# Patient Record
Sex: Female | Born: 1950 | Race: Black or African American | Hispanic: No | Marital: Married | Smoking: Never smoker
Health system: Southern US, Community
[De-identification: ages and names within clinical notes are randomized; demographics above are authoritative.]

## PROBLEM LIST (undated history)

## (undated) DIAGNOSIS — I499 Cardiac arrhythmia, unspecified: Secondary | ICD-10-CM

## (undated) DIAGNOSIS — I10 Essential (primary) hypertension: Secondary | ICD-10-CM

## (undated) DIAGNOSIS — J189 Pneumonia, unspecified organism: Secondary | ICD-10-CM

## (undated) HISTORY — PX: NO PAST SURGERIES: SHX2092

## (undated) SURGERY — Surgical Case
Anesthesia: *Unknown

---

## 2017-07-12 ENCOUNTER — Emergency Department (HOSPITAL_COMMUNITY)
Admission: EM | Admit: 2017-07-12 | Discharge: 2017-07-12 | Disposition: A | Discharge status: Home / Self Care | Payer: Self-pay | Attending: Emergency Medicine | Admitting: Emergency Medicine

## 2017-07-12 ENCOUNTER — Encounter (HOSPITAL_COMMUNITY): Payer: Self-pay | Admitting: Emergency Medicine

## 2017-07-12 DIAGNOSIS — I10 Essential (primary) hypertension: Secondary | ICD-10-CM | POA: Insufficient documentation

## 2017-07-12 DIAGNOSIS — Y999 Unspecified external cause status: Secondary | ICD-10-CM | POA: Insufficient documentation

## 2017-07-12 DIAGNOSIS — S81802A Unspecified open wound, left lower leg, initial encounter: Secondary | ICD-10-CM | POA: Insufficient documentation

## 2017-07-12 DIAGNOSIS — Y929 Unspecified place or not applicable: Secondary | ICD-10-CM | POA: Insufficient documentation

## 2017-07-12 DIAGNOSIS — M7989 Other specified soft tissue disorders: Secondary | ICD-10-CM

## 2017-07-12 DIAGNOSIS — X58XXXA Exposure to other specified factors, initial encounter: Secondary | ICD-10-CM | POA: Insufficient documentation

## 2017-07-12 DIAGNOSIS — Z79899 Other long term (current) drug therapy: Secondary | ICD-10-CM | POA: Insufficient documentation

## 2017-07-12 DIAGNOSIS — R2243 Localized swelling, mass and lump, lower limb, bilateral: Secondary | ICD-10-CM | POA: Insufficient documentation

## 2017-07-12 DIAGNOSIS — Y939 Activity, unspecified: Secondary | ICD-10-CM | POA: Insufficient documentation

## 2017-07-12 HISTORY — DX: Essential (primary) hypertension: I10

## 2017-07-12 LAB — BASIC METABOLIC PANEL
Anion gap: 14 (ref 5–15)
BUN: 8 mg/dL (ref 6–20)
CHLORIDE: 106 mmol/L (ref 101–111)
CO2: 24 mmol/L (ref 22–32)
CREATININE: 0.95 mg/dL (ref 0.44–1.00)
Calcium: 9.2 mg/dL (ref 8.9–10.3)
GFR calc Af Amer: >60 mL/min (ref >60)
GFR calc non Af Amer: >60 mL/min (ref >60)
Glucose, Bld: 67 mg/dL (ref 65–99)
Potassium: 4.1 mmol/L (ref 3.5–5.1)
SODIUM: 144 mmol/L (ref 135–145)

## 2017-07-12 LAB — TROPONIN I

## 2017-07-12 MED ORDER — DOXYCYCLINE HYCLATE 100 MG PO CAPS: 100.0000 mg | ORAL_CAPSULE | Freq: Two times a day (BID) | ORAL | 0 refills | Status: DC

## 2017-07-12 MED ORDER — HYDROCHLOROTHIAZIDE 25 MG PO TABS: 25.0000 mg | ORAL_TABLET | Freq: Every day | ORAL | 3 refills | Status: DC

## 2017-07-12 NOTE — ED Notes (Addendum)
This EMT approached by pt. Pt does not speak english. Technical sales engineerLingala translator  Services by phone. Pt inquired about wait time. Pt also inquired about transportation back home. Pt provided following information about contact with possible transportation post dicharge:  Rodman CompMarietta Gouglas, RN [employer] [phone]   This EMT to follow up with contact provided and consult with case manager.

## 2017-07-12 NOTE — ED Notes (Signed)
RN asked for phlebotomist to draw blood. Multiple attempts made by other staff with no success.

## 2017-07-12 NOTE — ED Provider Notes (Signed)
[facility] EMERGENCY DEPARTMENT Provider Note   CSN: [id number] Arrival date & time: 07/12/17  1219     History   Chief Complaint Chief Complaint  Patient presents with  . Knee Pain    HPI [name] [name] is a 67 y.o. female.  The patient was interviewed through a fluent translator, by phone, and her native language Ling[place], from the [place].  HPI   The patient is here for evaluation of high blood pressure and knee pain.  She also complains of a wound to her right ankle region, present for 2 months.  The knee pain has been present for several weeks.  She arrived in the [place], from the [place], about 2 weeks ago.  She is currently taking enalapril, daily, for hypertension.  She was seen earlier today by a congregational nurse, found to have high blood pressure and sent here for evaluation.  Patient denies chest pain, shortness of breath, headache, dizziness or weakness.  There are no other no modifying factors.  Past Medical History:  Diagnosis Date  . Hypertension     There are no active problems to display for this patient.   History reviewed. No pertinent surgical history.   OB History   None      Home Medications    Prior to Admission medications   Medication Sig Start Date End Date Taking? Authorizing Provider  Calcium Carbonate (CALCIUM 600 PO) Take 1,200 mg by mouth 2 (two) times daily.    Yes [provider]  UNKNOWN TO PATIENT Unnamed OTC for "muscle, knee, and leg pain": Take 2 tablets by mouth in the morning and 2 tablets at night   Yes [provider]  UNKNOWN TO PATIENT Unnamed ACE inhibitor- Take 10 mg by mouth once a day for blood pressure/ends in "-pril"   Yes [provider]  doxycycline (VIBRAMYCIN) 100 MG capsule Take 1 capsule (100 mg total) by mouth 2 (two) times daily. 07/12/17   Mancel Bale, MD  hydrochlorothiazide (HYDRODIURIL) 25 MG tablet Take 1 tablet (25 mg total) by mouth daily. 07/12/17    Mancel Bale, MD    Family History No family history on file.  Social History Social History   Tobacco Use  . Smoking status: Not on file  Substance Use Topics  . Alcohol use: Not on file  . Drug use: Not on file     Allergies   Patient has no known allergies.   Review of Systems Review of Systems  All other systems reviewed and are negative.    Physical Exam Updated Vital Signs BP (!) 190/90   Pulse (!) 59   Temp 98.4 F (36.9 C) (Oral)   Resp 12   SpO2 98%   Physical Exam  Constitutional: She is oriented to person, place, and time. She appears well-developed and well-nourished.  HENT:  Head: Normocephalic and atraumatic.  Right Ear: External ear normal.  Left Ear: External ear normal.  Eyes: Pupils are equal, round, and reactive to light. Conjunctivae and EOM are normal.  Neck: Normal range of motion and phonation normal. Neck supple.  Cardiovascular: Normal rate.  Pulmonary/Chest: Effort normal. She exhibits no bony tenderness.  Musculoskeletal: Normal range of motion. She exhibits edema (Bilateral legs, ankles to feet, right greater than left.).  Right anterior lower leg wound just above the ankle.  Wound is open and has a small amount of granulation tissue and drainage, from a wound approximately 1.5 cm in diameter.  There is mild associated  fluctuance and edema associated with this wound.  There is normal range of motion of both knees ankles and feet.  She appears to have normal perfusion to the toes of both feet.  Neurological: She is alert and oriented to person, place, and time. No cranial nerve deficit or sensory deficit. She exhibits normal muscle tone. Coordination normal.  Skin: Skin is warm, dry and intact.  Psychiatric: She has a normal mood and affect. Her behavior is normal. Judgment and thought content normal.  Nursing note and vitals reviewed.    ED Treatments / Results  Labs (all labs ordered are listed, but only abnormal results are  displayed) Labs Reviewed  BASIC METABOLIC PANEL  TROPONIN I  CBC WITH DIFFERENTIAL/PLATELET  SEDIMENTATION RATE    EKG EKG Interpretation  Date/Time:  Wednesday July 12 2017 18:05:27 EDT Ventricular Rate:  62 PR Interval:    QRS Duration: 104 QT Interval:  435 QTC Calculation: 442 R Axis:   57 Text Interpretation:  Sinus rhythm Multiform ventricular premature complexes Anteroseptal infarct, old Borderline repolarization abnormality Borderline ST elevation, lateral leads No old tracing to compare Confirmed by Mancel BaleWentz, Laiylah Roettger [phone]([id number]) on 07/12/2017 6:54:24 PM   Radiology No results found.  Procedures Procedures (including critical care time)  Medications Ordered in ED Medications - No data to display   Initial Impression / Assessment and Plan / ED Course  I have reviewed the triage vital signs and the nursing notes.  Pertinent labs & imaging results that were available during my care of the patient were reviewed by me and considered in my medical decision making (see chart for details).  Clinical Course as of Jul 12 2221  Wed Jul 12, 2017  2157 Normal  Basic metabolic panel [EW]  2215 Normal  Troponin I [EW]    Clinical Course User Index [EW] Mancel BaleWentz, Tenelle Andreason, MD     Patient Vitals for the past 24 hrs:  BP Temp Temp src Pulse Resp SpO2  07/12/17 2130 (!) 190/90 - - (!) 59 12 98 %  07/12/17 1817 (!) 224/105 - - 61 15 99 %  07/12/17 1658 (!) 232/119 - - 68 18 100 %  07/12/17 1437 (!) 192/91 98.4 F (36.9 C) Oral (!) 59 16 100 %  07/12/17 1221 (!) 200/103 98.9 F (37.2 C) Oral 66 18 100 %    10:21 PM Reevaluation with update and discussion. After initial assessment and treatment, an updated evaluation reveals the patient is comfortable and eating supper.  She has no further complaints.  Findings discussed with the patient through the translator and all questions were answered. Mancel BaleElliott Montina Dorrance   Medical Decision Making: Lower leg swelling with high blood pressure.   Swelling right leg greater than left, likely associated with the wound in her ankle.  Doubt DVT, cellulitis, osteomyelitis or systemic infection.  CRITICAL CARE-no Performed by: Mancel BaleElliott Prima Rayner   Nursing Notes Reviewed/ Care Coordinated Applicable Imaging Reviewed Interpretation of Laboratory Data incorporated into ED treatment  The patient appears reasonably screened and/or stabilized for discharge and I doubt any other medical condition or other [facility] requiring further screening, evaluation, or treatment in the ED at this time prior to discharge.  Plan: Home Medications-continue current blood pressure medications; Home Treatments-Daily cleansing to ankle wound and warm compress to help wound heal.; return here if the recommended treatment, does not improve the symptoms; Recommended follow up-PCP follow-up as soon as possible for further care and treatment.   Final Clinical Impressions(s) / ED Diagnoses   Final diagnoses:  Hypertension, unspecified type  Leg swelling  Wound of left lower extremity, initial encounter    ED Discharge Orders        Ordered    hydrochlorothiazide (HYDRODIURIL) 25 MG tablet  Daily     07/12/17 2217    doxycycline (VIBRAMYCIN) 100 MG capsule  2 times daily     07/12/17 2217       Mancel Bale, MD 07/12/17 2223

## 2017-07-12 NOTE — ED Notes (Signed)
RN attempted to draw blood from IV, unsuccessful

## 2017-07-12 NOTE — ED Triage Notes (Addendum)
Patient complains of right knee pain, arrives with [facility] called out by congressional nurse who did not speak Lingala. EMS states they were called out for chest pain by congressional nurse, when EMS arrived translator had also just arrived, patient denied chest pain but was complaining about her knees. On arrival to ED with Domingo MadeiraLingala translator, patient states her knees have hurt for approximatley twenty years. Patient has history of hypertension, states she has taken her blood pressure medicine today.  BP 195/109 HR 67 RR 18 95% SpO2 on room air

## 2017-07-12 NOTE — ED Notes (Signed)
Pt called out wondering what was taking so long. RN explained using Lingala interpreter, we need her blood work

## 2017-07-12 NOTE — ED Notes (Signed)
Contact numbers provided did not yield an answer. Followed up with case management.

## 2017-07-12 NOTE — Discharge Instructions (Addendum)
Your blood pressure, and swelling in your legs, should improve with the prescribed medication, hydrochlorothiazide.  We are prescribing an antibiotic to treat the wound of your right ankle.  Clean the wound on your right ankle with soap and water every day, and apply a warm compress to it to help the wound heal.  It is important to follow-up with a primary care doctor for a checkup in 1 or 2 weeks.  Use the resource guide attached, to help you find a doctor.

## 2017-07-12 NOTE — Congregational Nurse Program (Signed)
Congregational Nurse Program Note  Date of Encounter: 07/12/2017  Past Medical History: Past Medical History:  Diagnosis Date  . Hypertension     Encounter Details: CNP Questionnaire - 07/12/17 1045      Questionnaire   Patient Status  Immigrant    Race  African    Location Patient Served At  [facility]    Insurance  Not Applicable    Uninsured  Uninsured (NEW 1x/quarter)    Food  No food insecurities    Housing/Utilities  Yes, have permanent housing    Transportation  Yes, need transportation assistance;Provided transportation assistance (bus pass, taxi voucher, etc.)    Interpersonal Safety  Yes, feel physically and emotionally safe where you currently live    Medication  Yes, have medication insecurities    Medical Provider  No    Referrals  Emergency Department    ED Visit Averted  Not Applicable    Life-Saving Intervention Made  Not Applicable

## 2017-07-12 NOTE — Congregational Nurse Program (Signed)
Congregational Nurse Program Note  Date of Encounter: 07/12/2017  Past Medical History: Past Medical History:  Diagnosis Date  . Hypertension     Encounter Details: CNP Questionnaire - 07/12/17 1045      Questionnaire   Patient Status  Immigrant    Race  African    Location Patient Served At  [facility]    Insurance  Not Applicable    Uninsured  Uninsured (NEW 1x/quarter)    Food  No food insecurities    Housing/Utilities  Yes, have permanent housing    Transportation  Yes, need transportation assistance;Provided transportation assistance (bus pass, taxi voucher, etc.)    Interpersonal Safety  Yes, feel physically and emotionally safe where you currently live    Medication  Yes, have medication insecurities    Medical Provider  No    Referrals  Emergency Department    ED Visit Averted  Not Applicable    Life-Saving Intervention Made  Not Applicable      Referral to nurse office at [facility] for complaint of "pain in eyes, right leg and blood pressure". No medications taken today or recently.  Non-English speaking lady arriving from [place] through [employer] since two weeks and attending English classes at [facility]. Originally indicated some Swahili or [place], but interpreter had difficulty understanding patient. Communicated effectively using Lingala dialect via [employer] and student interpreter. Appeared alert during interview, except seemed uncomfortable in general. Blood pressure fluctuated between 187/97-200/108. Afebrile. Observed difficult mobility using cane with arm brace.  Right leg lower extremity tissue discoloration, 2-3 plus edema into ankle area and pitting. Approximately 2 cm lesion with scab on right lower leg. Unaccompanied by family. Plans to contact friends or family member at address given. Arrangement for transportation after discharge needed. [facility]. Follow-up with nurse office at [facility] prn. Ferol LuzMarietta Akia Montalban, RN/CN. [phone]

## 2017-07-18 NOTE — Congregational Nurse Program (Signed)
Congregational Nurse Program Note  Date of Encounter: 07/18/2017  Past Medical History: Past Medical History:  Diagnosis Date  . Hypertension     Encounter Details: CNP Questionnaire - 07/18/17 1454      Questionnaire   Patient Status  Immigrant    Race  African    Location Patient Served At  [facility]    Insurance  Not Applicable    Uninsured  Uninsured (Subsequent visits/quarter)    Food  No food insecurities    Housing/Utilities  Yes, have permanent housing    Transportation  Yes, need transportation assistance    Interpersonal Safety  Yes, feel physically and emotionally safe where you currently live    Medication  Yes, have medication insecurities    Medical Provider  No    ED Visit Averted  Yes    Life-Saving Intervention Made  Not Applicable       This is a follow-up visit after [facility]. Continues to experience pain in right lower leg and ankle during mobility today. Using cane with arm support to walk. Requesting a referral for evaluation of leg, but status as "Immigrant" prohibits Medicaid eligibility. Previously spoke to agency SW and information confirmed. No private insurance coverage. Blood pressure improved but continues elevated with pain apparently. Condition seems stressful. Edema of right leg decreased and less redness, but dark reddish discoloration continues above ankle and lesion about  the same. Recommended follow-up with [facility] office during attendance  in 1 week. Stressed need to continue medication as directed. Counseled regarding low salt diet and increasing fresh vegetables and fruits. Ferol LuzMarietta Cashius Grandstaff, RN/CN  Condition seems stress

## 2017-07-25 NOTE — Congregational Nurse Program (Signed)
Congregational Nurse Program Note  Date of Encounter: 07/25/2017  Past Medical History: Past Medical History:  Diagnosis Date  . Hypertension     Encounter Details: CNP Questionnaire - 07/25/17 1115      Questionnaire   Patient Status  Immigrant    Race  African    Location Patient Served At  [facility]    Insurance  Not Applicable    Uninsured  Uninsured (Subsequent visits/quarter)    Food  No food insecurities    Housing/Utilities  Yes, have permanent housing    Transportation  Yes, need transportation assistance    Interpersonal Safety  Yes, feel physically and emotionally safe where you currently live    Medication  Yes, have medication insecurities    Medical Provider  No    Referrals  Emergency Department    ED Visit Averted  Yes    Life-Saving Intervention Made  Not Applicable      Office visit at [facility] to follow-up elevated blood pressure and status of right lower leg ulcer. This Lingala speaking lady is of "Immigration status" and inelIgible for Medicaid or other affordable health care coverage during limited visit to [place]  Blood pressure improved, but continues abnormally high. Right lower outer leg ulcer above right ankle continues to drain small amount of bloody discharge from a 2 cm lesion. Affected area extends approximately 2 inches with discoloration and flaking of skin.  Continues to complain of severe right knee pain when walking with crutch. Requested information on cost of private health insurance premium. Recommended continuous cleansing and soaking of right foot in warm. Emphasized need to take medication for B/P as instructed. Return on 07/26/17 for observation of lesion and blood pressure check. Ferol LuzMarietta Siddarth Hsiung, RN/CHN.

## 2017-07-26 NOTE — Congregational Nurse Program (Signed)
Congregational Nurse Program Note  Date of Encounter: 07/26/2017  Past Medical History: Past Medical History:  Diagnosis Date  . Hypertension     Encounter Details: CNP Questionnaire - 07/26/17 1100      Questionnaire   Patient Status  Immigrant    Race  African    Location Patient Served At  [facility]    Insurance  Not Applicable    Uninsured  Uninsured (Subsequent visits/quarter)    Food  No food insecurities    Housing/Utilities  Yes, have permanent housing    Transportation  Yes, need transportation assistance    Interpersonal Safety  Yes, feel physically and emotionally safe where you currently live    Medication  Yes, have medication insecurities    Medical Provider  No    Referrals  Emergency Department    ED Visit Averted  Yes    Life-Saving Intervention Made  Not Applicable       Follow-up office visit for blood pressure recheck and seeking assistance with securing health insurance coverage. Blood pressure range continues elevated. Brought medication container for hydrochlorothiazide 25mg  confirmed doxycycline finished. Medication instructions provided. Continues to complain of severe right knee and leg pain. Right knee swollen. Now taking Aleve for pain. Feeling "some" relief from pain in knee. Lesion of right ankle approximately 2x1.5  cm deep red and no drainage or pus noted. No history of diabetes. Less lower leg swelling than initially. Walking in open flat toe sandals. Recommended different type shoe for stability and safety of feet. Information provided on a few sites for medical insurance coverage. Return 08/01/17 for observation of open lesion on right ankle. Ferol LuzMarietta Rashawd Laskaris, RN/CN.

## 2017-08-30 NOTE — Congregational Nurse Program (Signed)
Congregational Nurse Program Note  Date of Encounter: 08/22/2017  Past Medical History: Past Medical History:  Diagnosis Date  . Hypertension     Encounter Details: CNP Questionnaire - 08/22/17 1015      Questionnaire   Patient Status  Immigrant    Race  African    Location Patient Served At  [facility]    Insurance  Not Applicable    Uninsured  Uninsured (Subsequent visits/quarter)    Food  No food insecurities    Housing/Utilities  Yes, have permanent housing    Transportation  Yes, need transportation assistance    Interpersonal Safety  Yes, feel physically and emotionally safe where you currently live    Medication  Yes, have medication insecurities    Medical Provider  No    Referrals  Emergency Department    ED Visit Averted  Yes    Life-Saving Intervention Made  Not Applicable      Follow-up office visit at [facility] nurse office due to chronic hypertension with recent treatment after arrival in [place] from [place]. Interview with [employer] speaking Swahil. No evidence of family present. B/P continues to fluctuate. Taking medication as prescribed since last visit to Urgent Care.  Observed stressed during most encounters due to concern about health and questionable ability to understand health system. As previously discussed, status as Immigrant denies her access to Medicaid or Orange Card at this time. Right ankle lesion noted approximately 1.5 cm circle x .5 cm deep with pus and bloody drainage. Original scab fell off.  No odor. Surrounding tissue discolored and scaly. Experiencing pain at site during examination and knee pain walking. Again discussed need to seek medical coverage and attention for problem. Denies financial support or family. Referred to [facility] second time for legal advice and [facility] assistance. Advised to return to Urgent Care for re-evaluation of right leg. Refused due to finances.  Investigate other resources for medical evaluation. Continue to evaluate blood  pressure at this site. Ferol LuzMarietta Loralyn [name], RN/CN [address] [facility] Recent Vital Signs   BP (!) 158/87 (BP Location: Left Arm, Patient Position: Sitting, Cuff Size: Normal)   Pulse 65   Temp 98.4 F (36.9 C) (Temporal)   Resp 14    Past Medical History:  Diagnosis Date  . Hypertension      Expected Discharge Date     Diet Order    None       VTE Documentation      Work Intensity Score/Level of Care     @LEVELOFCARE @   Mobility        Significant Events    DC Barriers   Abnormal Labs:  Ferol LuzMarietta Zarianna Dicarlo 08/30/2017, 2:52 PM

## 2017-10-03 ENCOUNTER — Other Ambulatory Visit: Payer: Self-pay

## 2017-10-03 ENCOUNTER — Emergency Department (HOSPITAL_COMMUNITY): Payer: Self-pay

## 2017-10-03 ENCOUNTER — Emergency Department (HOSPITAL_COMMUNITY)
Admission: EM | Admit: 2017-10-03 | Discharge: 2017-10-04 | Disposition: A | Discharge status: Home / Self Care | Payer: Self-pay | Attending: Emergency Medicine | Admitting: Emergency Medicine

## 2017-10-03 ENCOUNTER — Encounter: Payer: Self-pay | Admitting: Pediatric Intensive Care

## 2017-10-03 ENCOUNTER — Encounter (HOSPITAL_COMMUNITY): Payer: Self-pay

## 2017-10-03 DIAGNOSIS — I10 Essential (primary) hypertension: Secondary | ICD-10-CM | POA: Insufficient documentation

## 2017-10-03 DIAGNOSIS — Y939 Activity, unspecified: Secondary | ICD-10-CM | POA: Insufficient documentation

## 2017-10-03 DIAGNOSIS — M25561 Pain in right knee: Secondary | ICD-10-CM | POA: Insufficient documentation

## 2017-10-03 DIAGNOSIS — Z5189 Encounter for other specified aftercare: Secondary | ICD-10-CM

## 2017-10-03 DIAGNOSIS — Y999 Unspecified external cause status: Secondary | ICD-10-CM | POA: Insufficient documentation

## 2017-10-03 DIAGNOSIS — Y92009 Unspecified place in unspecified non-institutional (private) residence as the place of occurrence of the external cause: Secondary | ICD-10-CM | POA: Insufficient documentation

## 2017-10-03 DIAGNOSIS — Z79899 Other long term (current) drug therapy: Secondary | ICD-10-CM | POA: Insufficient documentation

## 2017-10-03 DIAGNOSIS — W1842XA Slipping, tripping and stumbling without falling due to stepping into hole or opening, initial encounter: Secondary | ICD-10-CM | POA: Insufficient documentation

## 2017-10-03 DIAGNOSIS — S91001A Unspecified open wound, right ankle, initial encounter: Secondary | ICD-10-CM | POA: Insufficient documentation

## 2017-10-03 DIAGNOSIS — M25562 Pain in left knee: Secondary | ICD-10-CM | POA: Insufficient documentation

## 2017-10-03 LAB — CBC WITH DIFFERENTIAL/PLATELET
Abs Immature Granulocytes: 0 10*3/uL (ref 0.0–0.1)
BASOS ABS: 0 10*3/uL (ref 0.0–0.1)
BASOS PCT: 1 %
EOS PCT: 3 %
Eosinophils Absolute: 0.2 10*3/uL (ref 0.0–0.7)
HCT: 39.6 % (ref 36.0–46.0)
HEMOGLOBIN: 12.6 g/dL (ref 12.0–15.0)
Immature Granulocytes: 0 %
LYMPHS PCT: 42 %
Lymphs Abs: 2.5 10*3/uL (ref 0.7–4.0)
MCH: 26.8 pg (ref 26.0–34.0)
MCHC: 31.8 g/dL (ref 30.0–36.0)
MCV: 84.1 fL (ref 78.0–100.0)
Monocytes Absolute: 0.6 10*3/uL (ref 0.1–1.0)
Monocytes Relative: 10 %
NEUTROS ABS: 2.7 10*3/uL (ref 1.7–7.7)
Neutrophils Relative %: 44 %
PLATELETS: 113 10*3/uL — AB (ref 150–400)
RBC: 4.71 MIL/uL (ref 3.87–5.11)
RDW: 14.8 % (ref 11.5–15.5)
WBC: 6 10*3/uL (ref 4.0–10.5)

## 2017-10-03 LAB — BASIC METABOLIC PANEL
Anion gap: 8 (ref 5–15)
BUN: 15 mg/dL (ref 8–23)
CALCIUM: 9.2 mg/dL (ref 8.9–10.3)
CO2: 27 mmol/L (ref 22–32)
CREATININE: 0.99 mg/dL (ref 0.44–1.00)
Chloride: 109 mmol/L (ref 98–111)
GFR, EST NON AFRICAN AMERICAN: 57 mL/min — AB (ref >60)
Glucose, Bld: 100 mg/dL — ABNORMAL HIGH (ref 70–99)
Potassium: 4.3 mmol/L (ref 3.5–5.1)
SODIUM: 144 mmol/L (ref 135–145)

## 2017-10-03 LAB — SEDIMENTATION RATE: SED RATE: 24 mm/h — AB (ref 0–22)

## 2017-10-03 MED ORDER — ACETAMINOPHEN 325 MG PO TABS
650.0000 mg | ORAL_TABLET | Freq: Once | ORAL | Status: AC
  Administered 2017-10-04: 650 mg via ORAL
  Filled 2017-10-03: qty 2

## 2017-10-03 MED ORDER — DOXYCYCLINE HYCLATE 100 MG PO CAPS: 100.0000 mg | ORAL_CAPSULE | Freq: Two times a day (BID) | ORAL | 0 refills | Status: DC

## 2017-10-03 MED ORDER — DOXYCYCLINE HYCLATE 100 MG PO TABS
100.0000 mg | ORAL_TABLET | Freq: Once | ORAL | Status: AC
  Administered 2017-10-04: 100 mg via ORAL
  Filled 2017-10-03: qty 1

## 2017-10-03 NOTE — ED Notes (Signed)
ED provider at bedside.

## 2017-10-03 NOTE — Care Management (Signed)
ED CM and CSW met with patient and Congregational Nurse Emerson Monte in triage. Patient does not speak [place] speaks [place] and prefers her dialect of [place].  Patient presents to [facility] ED with c/o of wound check. Patient is being followed by the [place], patient currently does not have a PCP, ED CM will reach out to [facility] CM to arrange follow up appointment. CM and CSW will continue to follow patient for discharge needs.

## 2017-10-03 NOTE — ED Provider Notes (Signed)
[facility] EMERGENCY DEPARTMENT Provider Note   CSN: [id number] Arrival date & time: 10/03/17  1510     History   Chief Complaint Chief Complaint  Patient presents with  . Wound Check    HPI [name] [name] is a 67 y.o. female with a past medical history of hypertension who presents today for evaluation of a wound check.  History is obtained from patient using interpreter, and chart review.  Patient has reportedly a chronic nonhealing wound to her right lateral ankle.  Chart review shows that this developed sometime around April.  She says that she was in the [place] when she rolled her ankle in a divot in the dark floor of her home and since then has had this wound.  She denies any fevers or chills.  She has been putting alcohol on the wound without significant relief.  She has been using a walking stick to help with the pain from this wound.  She denies any past medical history of diabetes.  She also reports chronic, bilateral worsening knee pain.  She says that this is been going on for multiple months.  She says that her family member in [place] went to the hospital and had operations to fix her with her knee pain and she wants this done for her also.    When asked about the black lines on her legs over her knees patient states that a few years ago in [place] she saw a healer who caught her knees and put African medicine in saying that they would help  HPI  Past Medical History:  Diagnosis Date  . Hypertension     There are no active problems to display for this patient.   History reviewed. No pertinent surgical history.   OB History   None      Home Medications    Prior to Admission medications   Medication Sig Start Date End Date Taking? Authorizing Provider  Calcium Carbonate (CALCIUM 600 PO) Take 1,200 mg by mouth 2 (two) times daily.     [provider]  doxycycline (VIBRAMYCIN) 100 MG capsule Take 1 capsule (100 mg total) by mouth 2 (two)  times daily. 07/12/17   Mancel Bale, MD  hydrochlorothiazide (HYDRODIURIL) 25 MG tablet Take 1 tablet (25 mg total) by mouth daily. 07/12/17   Mancel Bale, MD  UNKNOWN TO PATIENT Unnamed OTC for "muscle, knee, and leg pain": Take 2 tablets by mouth in the morning and 2 tablets at night    [provider]  UNKNOWN TO PATIENT Unnamed ACE inhibitor- Take 10 mg by mouth once a day for blood pressure/ends in "-pril"    [provider]    Family History History reviewed. No pertinent family history.  Social History Social History   Tobacco Use  . Smoking status: Never Smoker  . Smokeless tobacco: Never Used  Substance Use Topics  . Alcohol use: Not on file  . Drug use: Not on file     Allergies   Patient has no known allergies.   Review of Systems Review of Systems  Constitutional: Negative for chills and fever.  Musculoskeletal:       Pain in her bilateral knees.   Skin: Positive for wound.  All other systems reviewed and are negative.    Physical Exam Updated Vital Signs BP (!) 188/94 (BP Location: Right Arm)   Pulse 60   Temp 98.4 F (36.9 C) (Oral)   Resp 16   SpO2 100%  Physical Exam  Constitutional: She appears well-developed and well-nourished. No distress.  HENT:  Head: Normocephalic and atraumatic.  Eyes: Conjunctivae are normal. Right eye exhibits no discharge. Left eye exhibits no discharge. No scleral icterus.  Neck: Normal range of motion.  Cardiovascular: Normal rate, regular rhythm and intact distal pulses.  2+ DP/PT pulses bilaterally.  Pulmonary/Chest: Effort normal. No stridor. No respiratory distress.  Abdominal: She exhibits no distension.  Musculoskeletal: She exhibits no edema or deformity.  Bilateral knees have intact range of motion.  No obvious swelling to the knees.  There is no localized tenderness to palpation, rather bilateral diffuse tenderness to palpation.   Neurological: She is alert. She exhibits normal muscle  tone.  Skin: Skin is warm and dry. She is not diaphoretic.  There is a wound on the lateral aspect of the right ankle.  Wound debridement was performed.  No surrounding erythema, induration or fluctuance.    There are multiple subcentimeter black lines on patient's bilateral legs, these do not appear infected  Psychiatric: She has a normal mood and affect. Her behavior is normal.  Nursing note and vitals reviewed.        ED Treatments / Results  Labs (all labs ordered are listed, but only abnormal results are displayed) Labs Reviewed  CBC WITH DIFFERENTIAL/PLATELET - Abnormal; Notable for the following components:      Result Value   Platelets 113 (*)    All other components within normal limits  BASIC METABOLIC PANEL - Abnormal; Notable for the following components:   Glucose, Bld 100 (*)    GFR calc non Af Amer 57 (*)    All other components within normal limits  SEDIMENTATION RATE - Abnormal; Notable for the following components:   Sed Rate 24 (*)    All other components within normal limits    EKG None  Radiology No results found.  Procedures Debridement Performed by: Cristina Gong, PA-C Authorized by: Cristina Gong, PA-C  Consent: Verbal consent obtained. Local anesthesia used: no  Anesthesia: Local anesthesia used: no  Sedation: Patient sedated: no  Patient tolerance: Patient tolerated the procedure well with no immediate complications    (including critical care time)  Medications Ordered in ED Medications - No data to display   Initial Impression / Assessment and Plan / ED Course  I have reviewed the triage vital signs and the nursing notes.  Pertinent labs & imaging results that were available during my care of the patient were reviewed by me and considered in my medical decision making (see chart for details).    Patient presents today for evaluation of a wound on her right lateral ankle.  This wound is been present for multiple  months and is not healing.  Labs today do not show a significant leukocytosis, her glucose is not elevated.  X-rays of her bilateral knees and right ankle were obtained per her request.  Bilateral knees show arthritis, most likely causing her knee pain.  Her x-rays of her right ankle did not show any osseous involvement.  She is afebrile, not septic or tachycardic.  She was given a dose of doxycycline while in the emergency room along with prescription for at home doxycycline.  Ibuprofen and Tylenol as needed for pain.  Case management and social work was heavily involved in patient's care along with setting up appropriate outpatient follow-up, appreciate their involvement and assistance.  Ortho follow-up for her bilateral knee arthritis  The patient's wounds were dressed.  Return precautions  were discussed, and patient states her understanding.  Patient discharged home.  Final Clinical Impressions(s) / ED Diagnoses   Final diagnoses:  Visit for wound check    ED Discharge Orders    None       Cristina Gong, PA-C 10/04/17 0154    Rolan Bucco, MD 10/04/17 1547

## 2017-10-03 NOTE — Discharge Instructions (Signed)
Please take your antibiotics.    Ibuprofen 600mg  every 8 hours And Tylenol 650 mg every 8 hours

## 2017-10-03 NOTE — Congregational Nurse Program (Signed)
Via interpreter Sofie- client states that she has had wound above her right ankle since she came from the [place] at the end of May. She states that she has been reluctant to seek medical evaluation as people have told her that she will be charge for medica care and she has no money to pay. Client states that she did not come via a resettlement agency. Client states that she lives by herself. Client states that she needs to pay rent tomorrow and that she would rather go to the emergency room tomorrow. Client denies diabetes history. CN explained that she can be evaluated in the emergency department regardless of ability to pay. CN will meet client in emergency department and send client via taxi. Client has 2cm wide wound above right ankle. There is an area around wound with granulation. The wound has a small amount of serous drainage. There is erythema above the wound approximately half-way to knee. The clients lower leg is edematous in comparison to the left leg. Client agrees to be evaluated in the emergency department and will be transported via taxi. CN will meet client in ED.

## 2017-10-03 NOTE — ED Notes (Signed)
Pt speaks [name] only

## 2017-10-03 NOTE — ED Notes (Signed)
Patient transported to X-ray 

## 2017-10-03 NOTE — Progress Notes (Addendum)
Pt prefers to speak Lingala. Pt speaks [place] as well.   CSW met with pt with Congregational Nurse, [place]. [place] brought pt in from [place] [facility] for wound check. CSW used language line and spoke with pt in [place]. Pt can speak [place], would prefer to speak Lingala. Per Congregational Nurse, pt reports receiving the wound back in May before coming to the [place]. Since developing the wound, the wound has been having discharge. Pt hesitant to seek medical care. Pt has food insecurity, will provide resources and pt is being followed by [facility]. Pt has an apartment, lives alone. Pt is currently able to pay rent.   Social Work will continue to follow. CSW provided taxi voucher to RN for pt to return home pending disposition.   Wendelyn Breslow, [facility]  [phone]

## 2017-10-03 NOTE — ED Provider Notes (Signed)
Patient placed in Quick Look pathway, seen and evaluated   Interpretor used for evaluation. Pt speaks Lingala and [name] (Domingo Madeira is primary)  Chief Complaint: wound check  HPI:  Pt is a 67 y/o female with a h/o HTN who presents to the ED today for wound recheck. Spoke with victoria, RN from [employer] at bedside who gave history that this wound has been present at least since 07/2017. Pt presented to clinic today with continued complaints of the wound. She advised pt to come to ED.  Pt notes she has had drainage from the wound. No fevers/chills. Has tried putting alcohol on the wound with no relief.  ROS: wound to RLE (one)  Physical Exam:   Gen: No distress  Neuro: Awake and Alert  Skin: Warm    Focused Exam: RLE with 1+ pitting edema. 1.5 cm wound to RLE as pictured below.        Initiation of care has begun. The patient has been counseled on the process, plan, and necessity for staying for the completion/evaluation, and the remainder of the medical screening examination  Pt advised to inform nursing staff immediately if they experience any new or worsening of symptoms while waiting in the waiting room.    Karrie Meres, PA-C 10/03/17 1624    Gerhard Munch, MD 10/03/17 2355

## 2017-10-03 NOTE — ED Notes (Signed)
ED Provider at bedside. 

## 2017-10-03 NOTE — ED Triage Notes (Signed)
Pt here due to having wound to the right ankle.  Had some scant drainage a few days ago and went to pharmacy which perscribed alcohol to apply to the wound.  Pt is working on getting set up with wound ostomy care.  Pt is primarily a speaker of Lingala, Ipad interpreter used in care at triage.

## 2017-10-04 ENCOUNTER — Encounter: Payer: Self-pay | Admitting: Surgery

## 2017-10-04 ENCOUNTER — Telehealth: Payer: Self-pay

## 2017-10-04 NOTE — Telephone Encounter (Signed)
Call received from Shann Medal, [place] [facility] requesting an appointment for the patient at [facility]. An appointment was scheduled for tomorrow - 10/05/17 @ 1610 and this information was shared with [place].

## 2017-10-04 NOTE — Progress Notes (Unsigned)
ED CM contacted [facility] CM a follow up appointment has been scheduled for patient for tomorrow 9/5, [facility] Congregational  CM was  notified and arrangement will be made for patient's transport to  Appointment to establish care as per Clinic's CM.

## 2017-10-04 NOTE — ED Notes (Signed)
Patient verbalizes understanding of discharge instructions. Opportunity for questioning and answers were provided. Armband removed by staff, pt discharged from ED in wheelchair to cab, interpreter used.

## 2017-10-05 ENCOUNTER — Inpatient Hospital Stay: Payer: Self-pay | Admitting: Internal Medicine

## 2017-10-05 ENCOUNTER — Telehealth: Payer: Self-pay

## 2017-10-05 NOTE — Telephone Encounter (Signed)
Patients appointment has been re-scheduled to 10/24/17 @ 1030 due to scheduling needs.  Message sent to [facility], RN/ [facility] with the update.

## 2017-10-11 NOTE — Congregational Nurse Program (Signed)
Congregational Nurse Program Note  Date of Encounter: 10/11/2017  Past Medical History: Past Medical History:  Diagnosis Date  . Hypertension     Encounter Details: CNP Questionnaire - 10/11/17 1000      Questionnaire   Patient Status  Asylee    Race  African    Location Patient Served At  [employer]  Not Applicable    Uninsured  Uninsured (NEW 1x/quarter)    Food  Yes, have food insecurities;Within past 12 months, food ran out with no money to buy more    Housing/Utilities  Yes, have permanent housing    Transportation  Yes, need transportation assistance;Provided transportation assistance (bus pass, taxi voucher, etc.)    Interpersonal Safety  No, do not feel physically and emotionally safe where you currently live    Medication  Yes, have medication insecurities    Medical Provider  No    Referrals  Emergency Department    ED Visit Averted  Not Applicable    Life-Saving Intervention Made  Not Applicable      Office visit at [facility] office. Interview completed using staff interpreting in Swahili and [name] effectively. Lingala interpreter preferred. Seemed excited about recent medication for leg ulcer. Confirmed that all medications  Taken already as ordered. Will need a refill for Hydrodiuril 25 mg soon. Complained of constant right and left knee pain upon walking. Using knee support brace on right. Agreed to accept walker from agency to replace using crutch. Right lower leg ulcer 2x2 cm approximately and continues to ooze pale yellow pus upon pressure.  Expressed gratitude for assistance. Throughout encounter expressed frustration and stress about housing and financial state. No family or friends for support since arrival. Asylum status in limbo and attempting to finalize application. Currently awaiting assistance with medical coverage and scheduled for appointment at [facility], 10/24/17 at 10:30 am. POA: Meet with [facility] staff to pursue housing options; Complete  SCAT application for transport.; Provide walker to patient today; Return weekly for B/P evaluations at [facility]; Coordination of care between agencies explained and accepted; Appointment. with Chealy Sin 10/12/17 to review SCAT info and request food assistance if needed. Return on 10/17/17 for evaluation of leg ulcer and blood pressure. Coordination of care will be continued through the stated nursing staff and agencies. Ferol Luz, RN/CN

## 2017-10-24 ENCOUNTER — Inpatient Hospital Stay: Payer: Self-pay | Admitting: Family Medicine

## 2017-10-24 ENCOUNTER — Ambulatory Visit: Payer: Self-pay | Attending: Family Medicine | Admitting: Family Medicine

## 2017-10-24 ENCOUNTER — Encounter: Payer: Self-pay | Admitting: Family Medicine

## 2017-10-24 ENCOUNTER — Telehealth: Payer: Self-pay

## 2017-10-24 VITALS — BP 173/104 | HR 67 | Temp 97.9°F | Resp 16 | Wt 186.6 lb

## 2017-10-24 DIAGNOSIS — M170 Bilateral primary osteoarthritis of knee: Secondary | ICD-10-CM | POA: Insufficient documentation

## 2017-10-24 DIAGNOSIS — M1710 Unilateral primary osteoarthritis, unspecified knee: Secondary | ICD-10-CM

## 2017-10-24 DIAGNOSIS — L97313 Non-pressure chronic ulcer of right ankle with necrosis of muscle: Secondary | ICD-10-CM | POA: Insufficient documentation

## 2017-10-24 DIAGNOSIS — I10 Essential (primary) hypertension: Secondary | ICD-10-CM | POA: Insufficient documentation

## 2017-10-24 DIAGNOSIS — E1159 Type 2 diabetes mellitus with other circulatory complications: Secondary | ICD-10-CM | POA: Insufficient documentation

## 2017-10-24 DIAGNOSIS — M179 Osteoarthritis of knee, unspecified: Secondary | ICD-10-CM

## 2017-10-24 DIAGNOSIS — Z79899 Other long term (current) drug therapy: Secondary | ICD-10-CM | POA: Insufficient documentation

## 2017-10-24 HISTORY — DX: Osteoarthritis of knee, unspecified: M17.9

## 2017-10-24 HISTORY — DX: Unilateral primary osteoarthritis, unspecified knee: M17.10

## 2017-10-24 MED ORDER — LISINOPRIL 10 MG PO TABS
10.0000 mg | ORAL_TABLET | Freq: Every day | ORAL | 3 refills | Status: DC
Start: 2017-10-24 — End: 2017-12-11

## 2017-10-24 MED ORDER — MELOXICAM 7.5 MG PO TABS: 7.5000 mg | ORAL_TABLET | Freq: Every day | ORAL | 1 refills | Status: DC

## 2017-10-24 NOTE — Progress Notes (Signed)
Subjective:  Patient ID: [name] [name], female    DOB: [birth year]  Age: 67 y.o. MRN: [id number]  CC: Hospitalization Follow-up (ED f/u)   HPI [name] [name] is a 67 year old female originally from [place] who presents from the ED to establish care here.  She was seen for a right leg ulcer which has been present for months after the patient fell over her right ankle while in her home country of [place].  Her blood pressure was also elevated at the time. She was placed on hydrochlorothiazide and doxycycline. She underwent x-rays of the knee and ankles which revealed findings below:  Right ankle x-ray: IMPRESSION: 1. No acute osseous abnormality. 2. Lucency over the anterolateral soft tissues, may reflect ulcer or correspond to the history of a wound.  Right knee x-ray: IMPRESSION: 1. No acute osseous abnormality. 2. Moderate severe arthritis of the right knee with moderate knee Effusion  Left knee x-ray: IMPRESSION: Moderate patellofemoral and medial femorotibial compartment Osteoarthrosis.   She presents today and is complaining of pain in both knees which is worse on walking denies the presence of swelling.  Her blood pressure is also significantly elevated and she endorses compliance with hydrochlorothiazide has completed her course of doxycycline.  Denies headaches, blurry vision or chest pains.   Past Medical History:  Diagnosis Date  . Hypertension     No past surgical history on file.  No Known Allergies   Outpatient Medications Prior to Visit  Medication Sig Dispense Refill  . Calcium Carbonate (CALCIUM 600 PO) Take 1,200 mg by mouth 2 (two) times daily.     Marland Kitchen doxycycline (VIBRAMYCIN) 100 MG capsule Take 1 capsule (100 mg total) by mouth 2 (two) times daily. 20 capsule 0  . hydrochlorothiazide (HYDRODIURIL) 25 MG tablet Take 1 tablet (25 mg total) by mouth daily. 30 tablet 3  . UNKNOWN TO PATIENT Unnamed OTC for "muscle, knee, and leg pain": Take 2 tablets by  mouth in the morning and 2 tablets at night    . UNKNOWN TO PATIENT Unnamed ACE inhibitor- Take 10 mg by mouth once a day for blood pressure/ends in "-pril"     No facility-administered medications prior to visit.     ROS Review of Systems  Constitutional: Negative for activity change, appetite change and fatigue.  HENT: Negative for congestion, sinus pressure and sore throat.   Eyes: Negative for visual disturbance.  Respiratory: Negative for cough, chest tightness, shortness of breath and wheezing.   Cardiovascular: Negative for chest pain and palpitations.  Gastrointestinal: Negative for abdominal distention, abdominal pain and constipation.  Endocrine: Negative for polydipsia.  Genitourinary: Negative for dysuria and frequency.  Musculoskeletal:       See hpi  Skin: Negative for rash.  Neurological: Negative for tremors, light-headedness and numbness.  Hematological: Does not bruise/bleed easily.  Psychiatric/Behavioral: Negative for agitation and behavioral problems.    Objective:  BP (!) 173/104 (BP Location: Right Arm, Patient Position: Sitting, Cuff Size: Normal) Comment: recheck  Pulse 67   Temp 97.9 F (36.6 C) (Oral)   Resp 16   Wt 186 lb 9.6 oz (84.6 kg)   SpO2 98%   BP/Weight 10/24/2017 10/11/2017 10/03/2017  Systolic BP 173 180 184  Diastolic BP 104 90 78  Wt. (Lbs) 186.6 - -      Physical Exam  Constitutional: She is oriented to person, place, and time. She appears well-developed and well-nourished.  Cardiovascular: Normal rate, normal heart sounds and intact distal pulses.  No murmur heard. Pulmonary/Chest:  Effort normal and breath sounds normal. She has no wheezes. She has no rales. She exhibits no tenderness.  Abdominal: Soft. Bowel sounds are normal. She exhibits no distension and no mass. There is no tenderness.  Musculoskeletal: Normal range of motion.  Crepitus and range of motion of both knees bilaterally with associated tenderness Lateral aspect of  right lower leg with ulcer with blackish base, minimal drainage, surrounding tenderness Normal dorsalis pedis bilaterally and normal sensation distally  Neurological: She is alert and oriented to person, place, and time.  Skin: Skin is warm and dry.  Psychiatric: She has a normal mood and affect.     Assessment & Plan:   1. Essential hypertension Uncontrolled Lisinopril added to regimen, continue hydrochlorothiazide Counseled on blood pressure goal of less than 130/80, low-sodium, DASH diet, medication compliance, 150 minutes of moderate intensity exercise per week. Discussed medication compliance, adverse effects. - lisinopril (PRINIVIL,ZESTRIL) 10 MG tablet; Take 1 tablet (10 mg total) by mouth daily.  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 Primary osteoarthritis of both knees Placed on meloxicam   Meds ordered this encounter  Medications  . lisinopril (PRINIVIL,ZESTRIL) 10 MG tablet    Sig: Take 1 tablet (10 mg total) by mouth daily.    Dispense:  30 tablet    Refill:  3  . meloxicam (MOBIC) 7.5 MG tablet    Sig: Take 1 tablet (7.5 mg total) by mouth daily.    Dispense:  30 tablet    Refill:  1    Follow-up: Return in about 1 month (around 11/23/2017) for Follow-up of hypertension.   Hoy RegisterEnobong Jakori Burkett MD

## 2017-10-24 NOTE — Telephone Encounter (Signed)
As per Carleene Mains, [facility] scheduler who is able to speak [place] with patient, she did not bring any documents/bills for this CM to review.  She stated that [place] has the bills and is taking care of it.   Feliz Beam, [facility] Pharmacy Tech met with patient and provided her medications that were ordered by Dr Margarita Rana. With the assistance of [employer] inerpreter, she explained to the patient how to take her medications.   The patient said that she has no food at home. As per Lisette Abu, RN [facility], the member could be sent via [place] taxi to [employer] and she would provide her with food from their food pantry and then cab her to her home yahir Dortha Kern explained the plan for cab use and obtaining food to the patient. He also escorted patient to the cab and explained to driver that she was going to [place] [facility].  As per [place], the patient was to wait at [place] if [place] was not there when she ( patient ) arrived. Again,this was explained to the patient by Rolm Gala.   The patient left with AVS with information about her MRI scheduled for 10/27/17.   Update provided to [place], RN/[facility].

## 2017-10-27 ENCOUNTER — Ambulatory Visit (HOSPITAL_COMMUNITY)
Admission: RE | Admit: 2017-10-27 | Discharge: 2017-10-27 | Disposition: A | Discharge status: Home / Self Care | Payer: Self-pay | Source: Ambulatory Visit | Attending: Family Medicine | Admitting: Family Medicine

## 2017-10-27 DIAGNOSIS — S90911A Unspecified superficial injury of right ankle, initial encounter: Secondary | ICD-10-CM | POA: Insufficient documentation

## 2017-10-27 DIAGNOSIS — X58XXXA Exposure to other specified factors, initial encounter: Secondary | ICD-10-CM | POA: Insufficient documentation

## 2017-10-27 DIAGNOSIS — M7989 Other specified soft tissue disorders: Secondary | ICD-10-CM | POA: Insufficient documentation

## 2017-10-27 DIAGNOSIS — L97313 Non-pressure chronic ulcer of right ankle with necrosis of muscle: Secondary | ICD-10-CM

## 2017-10-27 MED ORDER — GADOBUTROL 1 MMOL/ML IV SOLN
8.0000 mL | Freq: Once | INTRAVENOUS | Status: AC | PRN
  Administered 2017-10-27: 8 mL via INTRAVENOUS

## 2017-10-31 ENCOUNTER — Telehealth: Payer: Self-pay

## 2017-10-31 ENCOUNTER — Encounter: Payer: Self-pay | Admitting: Pediatric Intensive Care

## 2017-10-31 NOTE — Telephone Encounter (Signed)
Spoke to [employer], RN/[place] [address].  Reviewed with her the documents that are needed for patient to apply for CFA /Orange Card - proof of address( pt will need an ID), proof of income ( non  Risk manager and notarized letter of support), proof of assets( patient has none). Instructed [place] to contact [facility] to schedule an appointment with the financial counselor when the documents are collected.  She stated she will have the [employer] work with the patient to collect the documents.

## 2017-10-31 NOTE — Telephone Encounter (Signed)
Call placed to pt through [employer].  Interpreter:  Waunita Schooner #[id number]. Pt was informed that her test showed that there is no infection in the bone.  She was reminded that the wound care center would be the medical group helping her with healing in the wound on the ankle.  Appointment for wound care given to pt as well as her next [facility] DM appt.  Pt asked about how she was going to get there and what she is going to eat.  Pt was told that the congregational nurses are helping her with that and that this nurse will pass along there concerns about transportation and  Food need. Voice mail left for [facility] @ 2:45pm.

## 2017-11-01 NOTE — Congregational Nurse Program (Signed)
Office visit at [facility] nursing office to follow-up multiple concerns today. Confirmed knowledge of future  medical appointments at [facility] arranged and coordinated with team on 10/15 and 10/22. Vital signs good, esp. Blood pressure. Compliant with medication orders. States she is out of antibiotic. Right leg ulcer blackish color, firm, no drainage and approximately 2x2 cm with 5.5 cm surrounding discoloration of tissue. Questionable odor of lesion noted.  Patient's major concern today involved current housing lease ending and affordable housing. Unable to identify any income comparable to cost of renting and no  friends or family known. Recommended possibly renting a room vs apartment until proper credentials obtained. Return to [facility] nursing office on Tuesday, 11/08/17 to discuss possible solutions and sign SCAT application for transportation. Determined that crutch provided better support on right leg than walker offered, per patient. Elevate right foot sitting. Deliver food items provided by [facility] Child psychotherapist.  Ferol Luz, RN/CN.

## 2017-11-02 ENCOUNTER — Telehealth: Payer: Self-pay

## 2017-11-02 NOTE — Telephone Encounter (Signed)
-----   Message from Hoy Register, MD sent at 10/30/2017  7:37 AM EDT ----- MRI reveals ankle cellulitis; no infection in the bone. Continue current management and await appointment with wound care

## 2017-11-02 NOTE — Telephone Encounter (Signed)
Patient was called and there was no voicemail to pick up.    If patient returns phone call please inform patient of MRI results below.

## 2017-11-14 ENCOUNTER — Encounter (HOSPITAL_BASED_OUTPATIENT_CLINIC_OR_DEPARTMENT_OTHER): Payer: Self-pay

## 2017-11-14 ENCOUNTER — Telehealth: Payer: Self-pay | Admitting: Pediatric Intensive Care

## 2017-11-14 NOTE — Telephone Encounter (Signed)
Via [employer] interpreter Malena Catholic- Ms Randell Patient explained that she was unable to go to her wound care appointment due to lack of transportation. She states that her leg wound is open and painful again. CN explained that the wound care appointment was rescheduled and that she could meet with CN tomorrow at [facility] for evaluation. CN explained that care team would assist with planning for transportation to appointments for client. Client asked CN for assistance for immigration. 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 Client meet with this CN tomorrow. Email to Ferol Luz NAI CN to schedule meeting with client.

## 2017-11-23 ENCOUNTER — Ambulatory Visit: Payer: Self-pay | Admitting: Family Medicine

## 2017-11-29 ENCOUNTER — Encounter (HOSPITAL_BASED_OUTPATIENT_CLINIC_OR_DEPARTMENT_OTHER): Payer: Self-pay | Attending: Internal Medicine

## 2017-11-29 DIAGNOSIS — I739 Peripheral vascular disease, unspecified: Secondary | ICD-10-CM | POA: Insufficient documentation

## 2017-11-29 DIAGNOSIS — L97812 Non-pressure chronic ulcer of other part of right lower leg with fat layer exposed: Secondary | ICD-10-CM | POA: Insufficient documentation

## 2017-11-29 DIAGNOSIS — I872 Venous insufficiency (chronic) (peripheral): Secondary | ICD-10-CM | POA: Insufficient documentation

## 2017-12-04 NOTE — Congregational Nurse Program (Signed)
Through [employer] interpretation- Francis Dowse, update client on upcoming appointments and transportation. Client to follow up in NAI clinic with Ferol Luz, CN.

## 2017-12-05 NOTE — Congregational Nurse Program (Signed)
Brief office visit to follow-up support for medical appointment and express frustration regarding medical bills and concern about housing. Advised regarding availability of affordable housing and limited options without financial support. Reminded of appointments scheduled. Right outer ankle lesion oozing purulent appearing drainage with questionable odor. Refused protective gauze dressing to site. Cautioned regarding touching or injuring area. Continue to follow-up options with NAI social worker, [place] and [place], [place]. Ferol Luz, RN/CN

## 2017-12-06 ENCOUNTER — Encounter (HOSPITAL_BASED_OUTPATIENT_CLINIC_OR_DEPARTMENT_OTHER): Payer: Self-pay | Attending: Internal Medicine

## 2017-12-06 DIAGNOSIS — I872 Venous insufficiency (chronic) (peripheral): Secondary | ICD-10-CM | POA: Insufficient documentation

## 2017-12-06 DIAGNOSIS — I10 Essential (primary) hypertension: Secondary | ICD-10-CM | POA: Insufficient documentation

## 2017-12-06 DIAGNOSIS — L97812 Non-pressure chronic ulcer of other part of right lower leg with fat layer exposed: Secondary | ICD-10-CM | POA: Insufficient documentation

## 2017-12-11 ENCOUNTER — Ambulatory Visit: Payer: Self-pay | Attending: Family Medicine | Admitting: Family Medicine

## 2017-12-11 ENCOUNTER — Telehealth: Payer: Self-pay

## 2017-12-11 ENCOUNTER — Encounter: Payer: Self-pay | Admitting: Family Medicine

## 2017-12-11 VITALS — BP 148/79 | HR 62 | Resp 18 | Ht 63.0 in | Wt 189.8 lb

## 2017-12-11 DIAGNOSIS — M170 Bilateral primary osteoarthritis of knee: Secondary | ICD-10-CM | POA: Insufficient documentation

## 2017-12-11 DIAGNOSIS — M545 Low back pain, unspecified: Secondary | ICD-10-CM

## 2017-12-11 DIAGNOSIS — M25562 Pain in left knee: Secondary | ICD-10-CM

## 2017-12-11 DIAGNOSIS — G8929 Other chronic pain: Secondary | ICD-10-CM

## 2017-12-11 DIAGNOSIS — I10 Essential (primary) hypertension: Secondary | ICD-10-CM

## 2017-12-11 DIAGNOSIS — M25561 Pain in right knee: Secondary | ICD-10-CM

## 2017-12-11 DIAGNOSIS — L97313 Non-pressure chronic ulcer of right ankle with necrosis of muscle: Secondary | ICD-10-CM

## 2017-12-11 DIAGNOSIS — M5440 Lumbago with sciatica, unspecified side: Secondary | ICD-10-CM

## 2017-12-11 DIAGNOSIS — Z79899 Other long term (current) drug therapy: Secondary | ICD-10-CM

## 2017-12-11 MED ORDER — LISINOPRIL-HYDROCHLOROTHIAZIDE 20-25 MG PO TABS: 1.0000 | ORAL_TABLET | Freq: Every day | ORAL | 6 refills | Status: DC

## 2017-12-11 MED ORDER — TRAMADOL HCL 50 MG PO TABS: 50.0000 mg | ORAL_TABLET | Freq: Three times a day (TID) | ORAL | 3 refills | Status: DC | PRN

## 2017-12-11 NOTE — Progress Notes (Signed)
Subjective:    Patient ID: [name] [name], female    DOB: [birth year], 67 y.o.   MRN: [id number]   Due to a language barrier, and audio interpreter service was used at today's visit.  Interpreter was difficult to hear and both myself and the patient had to speak very loudly in order to be understood  HPI 67 year old female new to me as a patient who presents in follow-up of hypertension and patient with complaint of knee pain.  Patient also has a chronic right lower extremity wound which appears to be managed by wound care.  Patient has a dressing on her right lower leg and through the interpreter, patient states that this will be removed on the 15th.  Patient was last seen in the office on 10/24/2017 to establish care status post ED visit for right leg ulcer as well as elevated blood pressure.  In the emergency department, patient was placed on doxycycline for her leg wound and hydrochlorothiazide for her blood pressure.  Patient also had x-rays done in the emergency department which showed moderate patellofemoral and medial femoral tibial compartment osteoarthritis of the left knee and moderate severe arthritis of the right knee with a moderate knee effusion.  At her most recent visit, lisinopril 10 mg was added in addition to the hydrochlorothiazide 25 mg for control of blood pressure.  MRI was ordered of the right lower leg to assess the wound and patient did not have osteomyelitis by MRI done on 10/27/2017.  Patient was also placed on meloxicam for bilateral knee pain.  Patient also with complaint of low back pain which radiates down her right leg to her knee.      Patient reports that she has been compliant with her blood pressure medication.  Patient reports that she did have a bad headache last night and felt slightly lightheaded.  Patient does not currently have a headache.  Patient denies any cough associated with the use of the blood pressure medicine.  Patient states that she continues to have  pain in both knees that is about an 8 on a 0-to-10 scale.  Patient states that the pain is worse on the right than the left leg.  Patient reports that she is having to use a crutch/cane to help with ambulation due to her knee pain.  Patient also with complaint of back pain and she states that the back pain radiates from her lower back down to her right knee.  Patient reports that she needs medication to help with her pain due to difficulty walking.  Patient reports that her right lower extremity was recently wrapped and the dressing will be removed on November 15.  Patient denies any chest pain or palpitations, no abdominal pain or nausea, no shortness of breath or cough.  Past Medical History:  Diagnosis Date  . Hypertension    Past Surgical History:  Procedure Laterality Date  . NO PAST SURGERIES     Family History  Family history unknown: Yes   Social History   Tobacco Use  . Smoking status: Never Smoker  . Smokeless tobacco: Never Used  Substance Use Topics  . Alcohol use: Not on file  . Drug use: Not on file  No Known Allergies    Review of Systems  Constitutional: Positive for fatigue. Negative for chills and fever.  Respiratory: Negative for cough and shortness of breath.   Cardiovascular: Positive for leg swelling (right knee and on the right leg before the dresssing was applied). Negative for  chest pain and palpitations.  Gastrointestinal: Negative for abdominal pain and nausea.  Genitourinary: Negative for dysuria and frequency.  Musculoskeletal: Positive for arthralgias, back pain, gait problem, joint swelling and myalgias.  Neurological: Positive for headaches. Negative for dizziness, syncope and weakness.       Objective:   Physical Exam BP (!) 148/79 (BP Location: Left Arm, Cuff Size: Large)   Pulse 62   Resp 18   Ht 5\' 3"  (1.6 m)   Wt 189 lb 12.8 oz (86.1 kg)   BMI 33.62 kg/m Nurse's notes and vital signs reviewed General-well-nourished, well-developed older  female in no acute distress sitting in exam chair.  Patient has what appears to be a crutch with a arm handle leaned nearby against a wall Neck-no JVD Lungs-clear to auscultation bilaterally Cardiovascular-regular rate and rhythm Abdomen-soft, nontender Back-no CVA tenderness, patient with lumbosacral tenderness and right SI joint tenderness to exam Musculoskeletal-patient with bilateral knee pain to palpation on both the medial and lateral joint lines but patient with an effusion of the right knee that is generalized and patient with compressive wrap to the right lower extremity below the knee       Assessment & Plan:  1. Essential hypertension Patient's blood pressure was initially very elevated.  Blood pressure did trend downward on subsequent rechecks.  At her last visit, patient was placed on lisinopril 10 mg in addition to 25 mg of hydrochlorothiazide.  As patients his blood pressure remains elevated, patient's lisinopril will be increased to 20 mg daily but will combine this with hydrochlorothiazide to hopefully help improve compliance by having patient take only 1 pill.  Patient will have BMP in follow-up of medication use.  Patient is encouraged to return to clinic in a few weeks to have blood pressure rechecked or to have blood pressure rechecked at a local pharmacy or fire station and report those results to this office. - lisinopril-hydrochlorothiazide (PRINZIDE,ZESTORETIC) 20-25 MG tablet; Take 1 tablet by mouth daily. To lower blood pressure  Dispense: 30 tablet; Refill: 6 - Basic Metabolic Panel - CBC with Differential  2. Chronic pain of both knees Prescription provided for tramadol to help with bilateral knee pain related to osteoarthritis as well as low back pain in addition to patient's lower extremity wound.  Patient has had x-rays of her knees done on 10/03/2017 showing severe arthritis  as well as a moderate effusion on the right and moderate arthritic changes and osteophytes  on the left knee with no joint effusion.  Once patient's chronic lower extremity wound has healed, patient will likely benefit from orthopedic evaluation. - traMADol (ULTRAM) 50 MG tablet; Take 1 tablet (50 mg total) by mouth every 8 (eight) hours as needed for moderate pain.  Dispense: 60 tablet; Refill: 3 - Basic Metabolic Panel - CBC with Differential  3. 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  Patient is given prescription for tramadol to take for her chronic knee pain from osteoarthritis as well as for treatment of her back pain. - traMADol (ULTRAM) 50 MG tablet; Take 1 tablet (50 mg total) by mouth every 8 (eight) hours as needed for moderate pain.  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  Patient is attending wound care for chronic ulcer of the right ankle. - Basic Metabolic Panel - CBC with Differential  5. Encounter for long-term (current) use of medications Patient will have BMP and CBC in follow-up of her long-term use of medications for treatment of hypertension as well as antibiotic and pain medication therapy for treatment of ankle ulcer. - Basic Metabolic Panel - CBC with Differential  An After Visit Summary was printed and given to the patient.  Return in about 4 weeks (around 01/08/2018) for pain/HTN.

## 2017-12-11 NOTE — Telephone Encounter (Signed)
Message sent to [place], Maren Reamer, [facility]/Congregational Nurses to provide an update on the patient's appointment today at [facility].  Informed them that the patient had her medications filled today at [facility] and was given the medications when she left the clinic.   She brought a stack of bills to the office but not what I is needed for the [employer] Card/ [employer]. She was given the applications and list of documentation that is needed.  It was also noted that she will need to obtain a Blue Card in order to be able to charge her medications to a [facility]  pharmacy account going forward.  She took cab transportation back to her home after the appointment.

## 2017-12-12 LAB — BASIC METABOLIC PANEL WITH GFR
BUN/Creatinine Ratio: 10 — ABNORMAL LOW (ref 12–28)
BUN: 11 mg/dL (ref 8–27)
CO2: 15 mmol/L — ABNORMAL LOW (ref 20–29)
Calcium: 9.2 mg/dL (ref 8.7–10.3)
Chloride: 110 mmol/L — ABNORMAL HIGH (ref 96–106)
Creatinine, Ser: 1.09 mg/dL — ABNORMAL HIGH (ref 0.57–1.00)
GFR calc Af Amer: 60 mL/min/1.73
GFR calc non Af Amer: 52 mL/min/1.73 — ABNORMAL LOW
Glucose: 73 mg/dL (ref 65–99)
Potassium: 4.5 mmol/L (ref 3.5–5.2)
Sodium: 147 mmol/L — ABNORMAL HIGH (ref 134–144)

## 2017-12-12 LAB — CBC WITH DIFFERENTIAL/PLATELET
Basophils Absolute: 0 x10E3/uL (ref 0.0–0.2)
Basos: 1 %
EOS (ABSOLUTE): 0.1 x10E3/uL (ref 0.0–0.4)
Eos: 2 %
Hematocrit: 41.2 % (ref 34.0–46.6)
Hemoglobin: 13.1 g/dL (ref 11.1–15.9)
Immature Grans (Abs): 0 x10E3/uL (ref 0.0–0.1)
Immature Granulocytes: 0 %
Lymphocytes Absolute: 2.1 x10E3/uL (ref 0.7–3.1)
Lymphs: 39 %
MCH: 26.2 pg — ABNORMAL LOW (ref 26.6–33.0)
MCHC: 31.8 g/dL (ref 31.5–35.7)
MCV: 82 fL (ref 79–97)
Monocytes Absolute: 0.6 x10E3/uL (ref 0.1–0.9)
Monocytes: 11 %
Neutrophils Absolute: 2.5 x10E3/uL (ref 1.4–7.0)
Neutrophils: 47 %
Platelets: 118 x10E3/uL — ABNORMAL LOW (ref 150–450)
RBC: 5 x10E6/uL (ref 3.77–5.28)
RDW: 17.5 % — ABNORMAL HIGH (ref 12.3–15.4)
WBC: 5.3 x10E3/uL (ref 3.4–10.8)

## 2017-12-13 NOTE — Congregational Nurse Program (Signed)
Office visit at NAI to confirm and seek assistance with directions to medical referrals scheduled by [facility], CN earlier. Appointments reviewed and understood. [facility] ulcer [facility] and coordinated with [facility] Staff. Pleased with treatment, but continues frustrated about Asylum issues and housing. Apparently Immigration confirmed receipt of application by certified mail correspondence, but no follow-up on an official interview. Medical appointments scheduled 10/30 @12 :45 pm at [facility] and [facility]11/17 [facility]. Received taxi vouchers from [place]. Will return to NAI office for instructions and [facility] from this office. Dates confirmed with taxi dispatcher. [name] LuzMarietta Merrillyn Ackerley, RN/CN

## 2017-12-13 NOTE — Congregational Nurse Program (Signed)
Brief follow-up office visit at NAI for this [place] speaking [place] lady routinely needing assistance in managing medical appointments. Vital signs stable. No problem with hypertensive med. Scheduled to see [facility] at [facility] today at 2 pm. Transportation assistance and instructed on entrance to facility. Ferol LuzMarietta Sheila Ocasio, RN/CN

## 2017-12-13 NOTE — Congregational Nurse Program (Signed)
Office visit at NAI to follow-up scheduled medical appointments and requesting a review of recent medications issued on last PCP appointment. New information and scheduled appointments including 12/13/17 at 1:45 with [facility] and 12/09 at 1:35 with [facility]. Reports that Tramadol is relieving pain in legs and knees. Instructed to d/c previous meds until consult with physician and take current RX, listed on last print out only. Dressing intact and dry on right leg and foot, despite raining and inadequate fabric sneaker covering partial area of foot. Will investigate a larger and more secure shoe in agency clothing items or other source. Cautioned regarding walking. Requested assistance of staff person at NAI  fluent in [place]; unable to access phone interpreter for interview. Very irritated with the multiple bills and paper information today. Agency SW provided approximately month supply of dry food items (beans, rice, noodles, dry good etc.)earlier. Assistance delivering food.  Social worker unavailable this am, but recommended scheduling appt.and follow-up. Discussed use of [place] bus system and transfer. Able to relate instructions back and familiar with transfer procedure. Requested assistance with transfer by classmate traveling on same bus. Confirmed transfer to Bus#7 to reach appointment on 11/13 at 1:45 pm. Agreed and comfortable with information. [place] bus ([place] tickets will be provided on 11/13 during attendance at NAI unless weather prohibits use of bus. Continue collaboration with Congregational Nurse team as noted.  Return to nurse office prn. Ferol LuzMarietta Shaletha Humble, RN/C unless

## 2017-12-21 ENCOUNTER — Encounter: Payer: Self-pay | Admitting: Pediatric Intensive Care

## 2017-12-22 ENCOUNTER — Encounter: Payer: Self-pay | Admitting: Pediatric Intensive Care

## 2017-12-27 ENCOUNTER — Telehealth (INDEPENDENT_AMBULATORY_CARE_PROVIDER_SITE_OTHER): Payer: Self-pay

## 2017-12-27 NOTE — Telephone Encounter (Signed)
-----   Message from Cain Saupeammie Fulp, MD sent at 12/12/2017 10:18 PM EST ----- Notify patient of a mild increase in her creatinine at 1.09 with normal being 0.57-1.0.  Patient also had a low platelet count on her complete blood count but this is slightly improved from blood work she had done 2 months ago.  Please have patient return for repeat labs in approximately 4 weeks or at her next visit if this is within the next 4 weeks

## 2017-12-27 NOTE — Telephone Encounter (Signed)
Call placed using [employer]WR[employer]([id number]) [employer] interpreter. Patient of aware of slightly abnormal results. Need for repeat labs in 4 weeks on 12/9 at 1:50. Patient provided address and phone number to [facility]. There still seemed to be a lack of understanding even with use of interpreter. Maryjean Mornempestt S Roberts, CMA

## 2018-01-08 ENCOUNTER — Ambulatory Visit: Payer: Self-pay | Admitting: Family Medicine

## 2018-01-09 ENCOUNTER — Telehealth: Payer: Self-pay | Admitting: Pediatric Intensive Care

## 2018-01-09 NOTE — Congregational Nurse Program (Signed)
Call to client via [employer] interpretation line [place] interpreter #SHTA. Via interpreter, client states she is very sad and worried because she is being evicted from housing. Client states that she had arranged to move in with a friend and that fell through. CN advised client that she would arrange for [facility] for case management regarding housing. CN will [facility] client at [facility] at 1230.

## 2018-01-09 NOTE — Congregational Nurse Program (Signed)
Provide client with taxi vouchers to go to [employer] for case management with [employer] [facility]. CN met client at [employer]. Client accessed food pantry after meeting with [facility]. Client was transported home by [employer] case Freight forwarder. CN will continue to follow up with [facility] Case Management to assess client ongoing needs.

## 2018-01-25 ENCOUNTER — Ambulatory Visit: Payer: Self-pay | Attending: Family Medicine | Admitting: Family Medicine

## 2018-01-25 ENCOUNTER — Encounter: Payer: Self-pay | Admitting: Family Medicine

## 2018-01-25 VITALS — BP 143/84 | HR 57 | Temp 98.4°F | Resp 16 | Wt 185.0 lb

## 2018-01-25 DIAGNOSIS — L97313 Non-pressure chronic ulcer of right ankle with necrosis of muscle: Secondary | ICD-10-CM

## 2018-01-25 DIAGNOSIS — Z792 Long term (current) use of antibiotics: Secondary | ICD-10-CM | POA: Insufficient documentation

## 2018-01-25 DIAGNOSIS — Z791 Long term (current) use of non-steroidal anti-inflammatories (NSAID): Secondary | ICD-10-CM | POA: Insufficient documentation

## 2018-01-25 DIAGNOSIS — L089 Local infection of the skin and subcutaneous tissue, unspecified: Secondary | ICD-10-CM

## 2018-01-25 DIAGNOSIS — Z79899 Other long term (current) drug therapy: Secondary | ICD-10-CM | POA: Insufficient documentation

## 2018-01-25 DIAGNOSIS — S81801D Unspecified open wound, right lower leg, subsequent encounter: Secondary | ICD-10-CM

## 2018-01-25 DIAGNOSIS — L03115 Cellulitis of right lower limb: Secondary | ICD-10-CM

## 2018-01-25 DIAGNOSIS — T148XXA Other injury of unspecified body region, initial encounter: Secondary | ICD-10-CM | POA: Insufficient documentation

## 2018-01-25 DIAGNOSIS — I10 Essential (primary) hypertension: Secondary | ICD-10-CM

## 2018-01-25 MED ORDER — SILVER SULFADIAZINE 1 % EX CREA: 1.0000 "application " | TOPICAL_CREAM | Freq: Every day | CUTANEOUS | 2 refills | Status: DC

## 2018-01-25 MED ORDER — AMOXICILLIN-POT CLAVULANATE 500-125 MG PO TABS: 1.0000 | ORAL_TABLET | Freq: Two times a day (BID) | ORAL | 0 refills | Status: AC

## 2018-01-25 NOTE — Progress Notes (Signed)
Subjective:    Patient ID: [name] [name], female    DOB: 23-Oct-1950, 67 y.o.   MRN: [id number]   Due to a language barrier, [employer] interpretation system used at today's visit  HPI       67 year old female with history of an open wound to the right lower extremity who is seen in follow-up secondary to complaint of increased pain, occasional drainage and increased warmth around the wound on her right lower leg.  Patient also with a history of hypertension for which she is seen in follow-up.  Patient denies any headaches or dizziness related to her blood pressure.  Patient has had sensation of her right ear stopping up at times as well as some decrease in hearing in the right ear.  Patient reports that this has occurred over the past 3 to 4 weeks.  Patient reports 2 days of feeling as if she has had a fever and patient with complaint of needlelike pain in the wound on her right lower leg.  Patient is unable to quantify her level of pain on a 0-to-10 scale.  Patient states that she did see wound care on 3 occasions the last of which was in November, possibly on the 20th.  Patient states that she does not believe that wound care made much difference as she continues to have the open wound on her right lower leg and now she has increased pain.  Patient is taking her blood pressure medication daily and denies any headaches or dizziness related to her blood pressure.  Patient continues to have chronic issues with bilateral knee pain but states that the tramadol that she was prescribed for pain did help with her knee pain but she is currently out of this medication.  Patient apparently was not aware that she was able to refill the medication. Past Medical History:  Diagnosis Date  . Hypertension    Past Surgical History:  Procedure Laterality Date  . NO PAST SURGERIES     Family History  Family history unknown: Yes   Social History   Tobacco Use  . Smoking status: Never Smoker  . Smokeless  tobacco: Never Used  Substance Use Topics  . Alcohol use: Not on file  . Drug use: Not on file  No Known Allergies    Review of Systems  Constitutional: Positive for fatigue and fever. Negative for chills.  HENT: Positive for congestion and hearing loss (sensation of decreased hearing and ear being stopped up on the right ). Negative for sore throat and trouble swallowing.   Cardiovascular: Positive for leg swelling. Negative for chest pain and palpitations.  Gastrointestinal: Negative for abdominal pain, constipation, diarrhea and nausea.  Genitourinary: Positive for frequency. Negative for dysuria.  Musculoskeletal: Positive for arthralgias, back pain and gait problem.       Chronic right low back and right knee pain and recent increase in right lower leg pain at the site of leg wound  Skin: Positive for color change and wound.  Neurological: Negative for dizziness and headaches.  Hematological: Negative for adenopathy. Does not bruise/bleed easily.       Objective:   Physical Exam BP (!) 143/84 (BP Location: Right Arm, Patient Position: Sitting, Cuff Size: Large)   Pulse (!) 57   Temp 98.4 F (36.9 C) (Oral)   Resp 16   Wt 185 lb (83.9 kg)   SpO2 98%   BMI 32.77 kg/m Nurse's notes and vital signs reviewed Gen- WNWD older female in NAD sitting in  chair in exam room. Patient has a crutch with arm brace leaning against a near-by wall.  ENT-TM's dull bilaterally and patient with some cerumen within the ear canals. Mild edema of the nasal turbinates with mild clear nasal discharge, mild posterior pharynx erythema. Neck- supple, no LAD Lungs- CTA bilaterally CV- regular rate and rhythm  ABD- soft and non-tender Back- mild lumbosacral tenderness to palpation; no CVA tenderness EXT- patient with below the knee mild non-pitting edema of the right LE  Skin- patient with a circular, open wound to the right lateral-anterior shin with one area of scant clear appearing drainage. Presence  of granulated tissue on base of wound. Superior area above the wound with increased redness, edema and increased warmth.        Assessment & Plan:  1. Essential hypertension Patient is on lisinopril- hydrochlorothiazide, 20-25 mg daily.  Blood pressure is near normal at today's visit.  Low-sodium diet and compliance with medication encouraged.  Patient will have BMP to check renal function and electrolytes secondary to her use of medications - Basic Metabolic Panel  2. 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  Patient also with increased warmth on exam.  Patient will have CBC done in follow-up.  Patient will also be placed on antibiotic and patient is encouraged to follow-up with wound care.  Will contact nurse coordinator with our office regarding helping patient with wound care and materials for dressing changes. - CBC with Differential  3. Infected open wound Patient with chronic wound which now appears to be reinfected.  Patient will be placed on Augmentin per prior wound culture.  Patient given prescription to have Silvadene cream to be applied to the wound daily along with clean bandage and Silvadene was applied at today's visit.  Patient will also have CBC done in follow-up and patient is to follow-up with wound care. - CBC with Differential - amoxicillin-clavulanate (AUGMENTIN) 500-125 MG tablet; Take 1 tablet (500 mg total) by mouth 2 (two) times daily for 10 days. Take after eating  Dispense: 20 tablet; Refill: 0 - silver sulfADIAZINE (SILVADENE) 1 % cream; Apply 1 application topically daily. To leg wound and cover with a new bandage daily  Dispense: 50 g; Refill: 2  4. Cellulitis of right lower leg Patient with increased warmth and redness of the skin area surrounding her wound consistent with cellulitis.  Prescription provided for Augmentin 500 mg twice daily to take after eating.  Patient  is encouraged to go to the emergency department for further evaluation if she has increased redness, pain, increased size or drainage from the wound or any concerns.  Patient will follow-up in 1 week. - amoxicillin-clavulanate (AUGMENTIN) 500-125 MG tablet; Take 1 tablet (500 mg total) by mouth 2 (two) times daily for 10 days. Take after eating  Dispense: 20 tablet; Refill: 0  -patient with URI/allergic rhinitis symptoms and otc antihistamine recommended for congestion and sensation of her ear feeling stopped up  An After Visit Summary was printed and given to the patient.  Allergies as of 01/25/2018   No Known Allergies     Medication List       Accurate as of January 25, 2018 11:59 PM. Always use your most recent med list.        amoxicillin-clavulanate 500-125 MG tablet Commonly known as:  AUGMENTIN Take 1 tablet (500 mg total) by mouth 2 (two) times daily for 10 days. Take after  eating   CALCIUM 600 PO Take 1,200 mg by mouth 2 (two) times daily.   lisinopril-hydrochlorothiazide 20-25 MG tablet Commonly known as:  PRINZIDE,ZESTORETIC Take 1 tablet by mouth daily. To lower blood pressure   meloxicam 7.5 MG tablet Commonly known as:  MOBIC Take 7.5 mg by mouth daily.   silver sulfADIAZINE 1 % cream Commonly known as:  SILVADENE Apply 1 application topically daily. To leg wound and cover with a new bandage daily   traMADol 50 MG tablet Commonly known as:  ULTRAM Take 1 tablet (50 mg total) by mouth every 8 (eight) hours as needed for moderate pain.   UNKNOWN TO PATIENT Unnamed ACE inhibitor- Take 10 mg by mouth once a day for blood pressure/ends in "-pril"       Return in about 1 week (around 02/01/2018) for wound follow-up; needs supplies to dress wound for 1 week.

## 2018-01-25 NOTE — Progress Notes (Signed)
Follow up wound right leg   [id number]

## 2018-01-26 LAB — CBC WITH DIFFERENTIAL/PLATELET
Basophils Absolute: 0 x10E3/uL (ref 0.0–0.2)
Basos: 0 %
EOS (ABSOLUTE): 0.2 x10E3/uL (ref 0.0–0.4)
Eos: 4 %
Hematocrit: 39 % (ref 34.0–46.6)
Hemoglobin: 12.6 g/dL (ref 11.1–15.9)
Immature Grans (Abs): 0 x10E3/uL (ref 0.0–0.1)
Immature Granulocytes: 0 %
Lymphocytes Absolute: 2.3 x10E3/uL (ref 0.7–3.1)
Lymphs: 44 %
MCH: 27.4 pg (ref 26.6–33.0)
MCHC: 32.3 g/dL (ref 31.5–35.7)
MCV: 85 fL (ref 79–97)
Monocytes Absolute: 0.6 x10E3/uL (ref 0.1–0.9)
Monocytes: 11 %
Neutrophils Absolute: 2.1 x10E3/uL (ref 1.4–7.0)
Neutrophils: 41 %
Platelets: 97 x10E3/uL — CL (ref 150–450)
RBC: 4.6 x10E6/uL (ref 3.77–5.28)
RDW: 15.7 % — ABNORMAL HIGH (ref 12.3–15.4)
WBC: 5.2 x10E3/uL (ref 3.4–10.8)

## 2018-01-26 LAB — BASIC METABOLIC PANEL WITH GFR
BUN/Creatinine Ratio: 19 (ref 12–28)
BUN: 24 mg/dL (ref 8–27)
CO2: 21 mmol/L (ref 20–29)
Calcium: 9.3 mg/dL (ref 8.7–10.3)
Chloride: 102 mmol/L (ref 96–106)
Creatinine, Ser: 1.28 mg/dL — ABNORMAL HIGH (ref 0.57–1.00)
GFR calc Af Amer: 49 mL/min/1.73 — ABNORMAL LOW
GFR calc non Af Amer: 43 mL/min/1.73 — ABNORMAL LOW
Glucose: 89 mg/dL (ref 65–99)
Potassium: 3.6 mmol/L (ref 3.5–5.2)
Sodium: 141 mmol/L (ref 134–144)

## 2018-01-28 ENCOUNTER — Other Ambulatory Visit: Payer: Self-pay | Admitting: Family Medicine

## 2018-01-28 DIAGNOSIS — D696 Thrombocytopenia, unspecified: Secondary | ICD-10-CM

## 2018-01-28 NOTE — Progress Notes (Signed)
Patient ID: [name] DelaineVeronque [name], female   DOB: 02-Sep-1950, 67 y.o.   MRN: [id number]   Patient with abnormal lab value of platelet count of 97. Patient will be contacted and referral made to hematology for further follow-up. Patient will also be asked to return for repeat BMP in follow-up elevated Cr of 1.28 and repeat CBC.

## 2018-01-29 ENCOUNTER — Telehealth: Payer: Self-pay

## 2018-01-29 NOTE — Telephone Encounter (Signed)
Message was sent to congregational nurses and FAI representative on 12.26.19 inquiring if they are able to assist with providing the patient with dressing supplies.   Message sent to [facility] Be[facility], RN/[facility] inquiring if she has contact information for the congregational nurses.

## 2018-01-30 ENCOUNTER — Telehealth: Payer: Self-pay

## 2018-01-30 NOTE — Telephone Encounter (Signed)
Ok

## 2018-01-30 NOTE — Telephone Encounter (Signed)
Message received from [facility]Marietta Douglas/Congregational nurse program regarding request for wound care supplies-  [facility]  is closed until after the New Year. She will check with another non-profit to request a donation of supplies if they have a surplus of items.

## 2018-02-01 ENCOUNTER — Ambulatory Visit: Payer: Self-pay | Attending: Family Medicine | Admitting: Physician Assistant

## 2018-02-01 ENCOUNTER — Encounter: Payer: Self-pay | Admitting: Family Medicine

## 2018-02-01 VITALS — BP 144/75 | HR 57 | Temp 98.6°F | Resp 18 | Ht 63.0 in | Wt 186.0 lb

## 2018-02-01 DIAGNOSIS — M545 Low back pain, unspecified: Secondary | ICD-10-CM

## 2018-02-01 DIAGNOSIS — M25561 Pain in right knee: Secondary | ICD-10-CM | POA: Insufficient documentation

## 2018-02-01 DIAGNOSIS — I10 Essential (primary) hypertension: Secondary | ICD-10-CM | POA: Insufficient documentation

## 2018-02-01 DIAGNOSIS — D696 Thrombocytopenia, unspecified: Secondary | ICD-10-CM | POA: Insufficient documentation

## 2018-02-01 DIAGNOSIS — M25562 Pain in left knee: Secondary | ICD-10-CM | POA: Insufficient documentation

## 2018-02-01 DIAGNOSIS — Z789 Other specified health status: Secondary | ICD-10-CM

## 2018-02-01 DIAGNOSIS — M5440 Lumbago with sciatica, unspecified side: Secondary | ICD-10-CM

## 2018-02-01 DIAGNOSIS — G8929 Other chronic pain: Secondary | ICD-10-CM | POA: Insufficient documentation

## 2018-02-01 DIAGNOSIS — Z791 Long term (current) use of non-steroidal anti-inflammatories (NSAID): Secondary | ICD-10-CM | POA: Insufficient documentation

## 2018-02-01 DIAGNOSIS — L97313 Non-pressure chronic ulcer of right ankle with necrosis of muscle: Secondary | ICD-10-CM | POA: Insufficient documentation

## 2018-02-01 DIAGNOSIS — Z79899 Other long term (current) drug therapy: Secondary | ICD-10-CM | POA: Insufficient documentation

## 2018-02-01 MED ORDER — TRAMADOL HCL 50 MG PO TABS: 50.0000 mg | ORAL_TABLET | Freq: Three times a day (TID) | ORAL | 0 refills | Status: DC | PRN

## 2018-02-01 MED ORDER — TRAMADOL HCL 50 MG PO TABS: 50.0000 mg | ORAL_TABLET | Freq: Three times a day (TID) | ORAL | 3 refills | Status: DC | PRN

## 2018-02-01 MED ORDER — MUPIROCIN 2 % EX OINT: TOPICAL_OINTMENT | CUTANEOUS | 0 refills | Status: DC

## 2018-02-01 NOTE — Progress Notes (Signed)
Patient ID: [name] [name], female   DOB: 11/29/1950, 68 y.o.   MRN: [id number]   [name] [name], is a 68 y.o. female  GOT:[id number]  DHR:[id number]  DOB - 01/19/1951  Subjective:  Chief Complaint and HPI: [name] [name] is a 68 y.o. female here today with wound recheck.  [employer] interpreters used.  Feels as though it is improving some.  No fever.  +pain and still has LBP and B knee pain.  She is washing the area daily and applying silvadene.  Still has several days of antibiotics.   ROS:   Constitutional:  No f/c, No night sweats, No unexplained weight loss. EENT:  No vision changes, No blurry vision, No hearing changes. No mouth, throat, or ear problems.  Respiratory: No cough, No SOB Cardiac: No CP, no palpitations GI:  No abd pain, No N/V/D. GU: No Urinary s/sx Musculoskeletal: as above Neuro: No headache, no dizziness, no motor weakness.  Skin: No rash Endocrine:  No polydipsia. No polyuria.  Psych: Denies SI/HI  No problems updated.  ALLERGIES: No Known Allergies  PAST MEDICAL HISTORY: Past Medical History:  Diagnosis Date  . Hypertension     MEDICATIONS AT HOME: Prior to Admission medications   Medication Sig Start Date End Date Taking? Authorizing Provider  amoxicillin-clavulanate (AUGMENTIN) 500-125 MG tablet Take 1 tablet (500 mg total) by mouth 2 (two) times daily for 10 days. Take after eating 01/25/18 02/04/18  Fulp, Cammie, MD  Calcium Carbonate (CALCIUM 600 PO) Take 1,200 mg by mouth 2 (two) times daily.     [provider]  lisinopril-hydrochlorothiazide (PRINZIDE,ZESTORETIC) 20-25 MG tablet Take 1 tablet by mouth daily. To lower blood pressure 12/11/17   Fulp, Cammie, MD  meloxicam (MOBIC) 7.5 MG tablet Take 7.5 mg by mouth daily.    [provider]  mupirocin ointment (BACTROBAN) 2 % Apply bid to wound 02/01/18   Marquelle Musgrave, Marzella Schlein, PA-C  traMADol (ULTRAM) 50 MG tablet Take 1 tablet (50 mg total) by mouth every 8 (eight) hours as needed  for moderate pain. 02/01/18   Anders Simmonds, PA-C  UNKNOWN TO PATIENT Unnamed ACE inhibitor- Take 10 mg by mouth once a day for blood pressure/ends in "-pril"    [provider]     Objective:  EXAM:   Vitals:   02/01/18 0914  BP: (!) 144/75  Pulse: (!) 57  Resp: 18  Temp: 98.6 F (37 C)  TempSrc: Oral  SpO2: 97%  Weight: 186 lb (84.4 kg)  Height: 5\' 3"  (1.6 m)    General appearance : A&OX3. NAD. Non-toxic-appearing HEENT: Atraumatic and Normocephalic.  PERRLA. EOM intact.  Neck: supple, no JVD. No cervical lymphadenopathy. No thyromegaly Chest/Lungs:  Breathing-non-labored, Good air entry bilaterally, breath sounds normal without rales, rhonchi, or wheezing  CVS: S1 S2 regular, no murmurs, gallops, rubs  Abdomen: Bowel sounds present, Non tender and not distended with no gaurding, rigidity or rebound. Extremities: Bilateral Lower Ext shows no edema, both legs are warm to touch with = pulse throughout Wound:  R lower leg-area just superior to lateral malleolus-1 cm central ulceration that appears to be filling in beneath vaseline gauze.  There is minimal swelling of the area without erythema or induration.  Non-stick pad and Ace wrap to wrap wound  Appropriate eye contact and affect.  Skin:  No Rash  Data Review No results found for: HGBA1C   Assessment & Plan   1. Thrombocytopenia (HCC) - CBC with Differential/Platelet -refer hematology  2. 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 Chronic pain of both knees - traMADol (ULTRAM) 50 MG tablet; Take 1 tablet (50 mg total) by mouth every 8 (eight) hours as needed for moderate pain.  Dispense: 60 tablet; Refill: 0  4. Low back pain with radiation - traMADol (ULTRAM) 50 MG tablet; Take 1 tablet (50 mg total)  by mouth every 8 (eight) hours as needed for moderate pain.  Dispense: 60 tablet; Refill: 0  5. Language barrier stratus interpreters used and additional time performing visit was required.    Patient have been counseled extensively about nutrition and exercise  Return in about 2 weeks (around 02/15/2018) for Dr Fulp-recheck wound.  The patient was given clear instructions to go to ER or return to medical center if symptoms don't improve, worsen or new problems develop. The patient verbalized understanding. The patient was told to call to get lab results if they haven't heard anything in the next week.     Georgian Co, PA-C [facility] [place], [place] [phone]   02/01/2018, 9:29 AM

## 2018-02-01 NOTE — Telephone Encounter (Signed)
Call received from [facility] stating that she will look for wound care supplies for the patient. Informed her that the patient was seen in the clinic today and was given some supplies as per [place], [place]

## 2018-02-02 LAB — CBC WITH DIFFERENTIAL/PLATELET
BASOS: 1 %
Basophils Absolute: 0 10*3/uL (ref 0.0–0.2)
EOS (ABSOLUTE): 0.2 10*3/uL (ref 0.0–0.4)
EOS: 4 %
HEMATOCRIT: 38.5 % (ref 34.0–46.6)
HEMOGLOBIN: 12.9 g/dL (ref 11.1–15.9)
IMMATURE GRANS (ABS): 0 10*3/uL (ref 0.0–0.1)
Immature Granulocytes: 0 %
LYMPHS: 47 %
Lymphocytes Absolute: 2.7 10*3/uL (ref 0.7–3.1)
MCH: 27.7 pg (ref 26.6–33.0)
MCHC: 33.5 g/dL (ref 31.5–35.7)
MCV: 83 fL (ref 79–97)
MONOS ABS: 0.6 10*3/uL (ref 0.1–0.9)
Monocytes: 10 %
NEUTROS ABS: 2.2 10*3/uL (ref 1.4–7.0)
Neutrophils: 38 %
Platelets: 104 10*3/uL — ABNORMAL LOW (ref 150–450)
RBC: 4.66 x10E6/uL (ref 3.77–5.28)
RDW: 15.6 % — ABNORMAL HIGH (ref 12.3–15.4)
WBC: 5.7 10*3/uL (ref 3.4–10.8)

## 2018-02-02 LAB — BASIC METABOLIC PANEL
BUN/Creatinine Ratio: 17 (ref 12–28)
BUN: 20 mg/dL (ref 8–27)
CO2: 22 mmol/L (ref 20–29)
CREATININE: 1.21 mg/dL — AB (ref 0.57–1.00)
Calcium: 9.3 mg/dL (ref 8.7–10.3)
Chloride: 103 mmol/L (ref 96–106)
GFR, EST AFRICAN AMERICAN: 53 mL/min/{1.73_m2} — AB (ref >59)
GFR, EST NON AFRICAN AMERICAN: 46 mL/min/{1.73_m2} — AB (ref >59)
Glucose: 91 mg/dL (ref 65–99)
POTASSIUM: 3.8 mmol/L (ref 3.5–5.2)
Sodium: 143 mmol/L (ref 134–144)

## 2018-02-06 NOTE — Congregational Nurse Program (Signed)
Brief office visit for this Lingala speaking lady in office seeking assistance to medical appointment. Scheduled for [facility] today at 2 pm. [place] bus tickets provided to appointment. Follow-up at [facility] prn. Ferol Luz, RN/CN

## 2018-02-06 NOTE — Congregational Nurse Program (Signed)
Visit to nurse office at [facility] requesting assistance with transport to medical appointment and concern regarding prescription refills and Immigration issues. Staff person provided Lingala interpretation during  interview. Scheduled return appointment at [facility] 02-23-18 at 2:35 pm to see Dr. Jillyn Hidden. Right lower leg ulcer covered with bandage. Yellowish drainage saturated bandage.  Clean gauze applied to site and advised regarding application of ointment. Assist with prescription refill. Referred to [facility] Sin, SW to discuss issues with Immigration appointment. Provide bus ticket to medical appointment. Refer to agency for assistance in securing adequate and warmer footwear. Follow-up prn. Ferol Luz, RN/CN.

## 2018-02-08 ENCOUNTER — Telehealth: Payer: Self-pay | Admitting: Hematology

## 2018-02-08 ENCOUNTER — Encounter: Payer: Self-pay | Admitting: Hematology

## 2018-02-08 NOTE — Telephone Encounter (Signed)
A new hem appt has been scheduled for the pt to see Dr. Candise Che on 1/28 at 1pm. Will mail the pt a letter and notify the referring office to call the pt w/the appt date and time.

## 2018-02-13 NOTE — Congregational Nurse Program (Signed)
Nurse office visit at [facility] to follow-up leg ulcer and dressing change. Self application of gauze dressing and Kerlix since last visit neatly applied with ointment. Bandaid applied directly on site also. Lesion approximately 1.5 cm and moist. Possibly granulation of tissue better per appearance.  No edema of lower extremities. Clean gauze dressing applied with ointment application. Cautioned not to use plastic bandages to cover lesion. Reports less pain at site today. Appointment scheduled 03-08-18 for [facility] interview and transportation approval. Transport to site arranged by [facility] with client's knowledge of date and time through interpreter.  Return 1/15 for nurse visit and SW intern follow-up. Ferol Luz, RN/CN. [phone].

## 2018-02-14 NOTE — Congregational Nurse Program (Signed)
Brief visit to nurse office with empty medication vials requesting assistance refilling prescriptions. Amoxicillin finished and no refills. [facility] confirmed refills. Will provide assistance in picking up medications. Gauze dressing intact on lower right leg ulcer.  Follow-up visit 02/20/18 at [facility]. Ferol Luz, RN/CN

## 2018-02-19 ENCOUNTER — Telehealth: Payer: Self-pay | Admitting: Family Medicine

## 2018-02-19 NOTE — Telephone Encounter (Signed)
[facility] called to verify which appt the patient should go to in regards to her wound patient was referred by angela to the wound care center Please follow up

## 2018-02-20 ENCOUNTER — Telehealth: Payer: Self-pay

## 2018-02-20 NOTE — Telephone Encounter (Signed)
Call received from [facility], RN/CN inquiring about patient's follow up appointment with Dr Jillyn Hidden.  Ii was in conflict with her appointment at the wound center on 02/23/2018.  The appointment with Dr Jillyn Hidden was rescheduled for 03/02/2018 @ 1050. This information was shared with Ms Riley Lam. The congregational nurse program provides bus passes to medical appointments for the patient

## 2018-02-23 ENCOUNTER — Encounter (HOSPITAL_BASED_OUTPATIENT_CLINIC_OR_DEPARTMENT_OTHER): Payer: Self-pay | Attending: Internal Medicine

## 2018-02-23 ENCOUNTER — Ambulatory Visit: Payer: Self-pay | Admitting: Family Medicine

## 2018-02-27 ENCOUNTER — Inpatient Hospital Stay: Payer: Self-pay | Admitting: Hematology

## 2018-03-02 ENCOUNTER — Encounter: Payer: Self-pay | Admitting: Family Medicine

## 2018-03-02 ENCOUNTER — Ambulatory Visit: Payer: Self-pay | Attending: Family Medicine | Admitting: Family Medicine

## 2018-03-02 VITALS — BP 171/91 | HR 72 | Temp 98.9°F | Resp 18 | Ht 63.0 in | Wt 186.0 lb

## 2018-03-02 DIAGNOSIS — D696 Thrombocytopenia, unspecified: Secondary | ICD-10-CM

## 2018-03-02 DIAGNOSIS — I10 Essential (primary) hypertension: Secondary | ICD-10-CM

## 2018-03-02 DIAGNOSIS — M79604 Pain in right leg: Secondary | ICD-10-CM

## 2018-03-02 DIAGNOSIS — L97313 Non-pressure chronic ulcer of right ankle with necrosis of muscle: Secondary | ICD-10-CM

## 2018-03-02 MED ORDER — ACETAMINOPHEN-CODEINE #3 300-30 MG PO TABS: 1.0000 | ORAL_TABLET | Freq: Four times a day (QID) | ORAL | 1 refills | Status: DC | PRN

## 2018-03-02 NOTE — Progress Notes (Signed)
Patient was made aware of results and returned for blood recheck

## 2018-03-02 NOTE — Progress Notes (Signed)
Subjective:    Patient ID: Noah Delaine, adult    DOB: [birth year], 68 y.o.   MRN: [id number]   Due to the presence of a language barrier, [employer] audio interpretation system used at today's visit  HPI      68 yo female seen in follow-up of hypertension, thrombocytopen and patient with a persistent , non-healing wound/ulcer to the right lower extremity. She reports that she takes her blood pressure medication daily. She thinks BP is high today as she is having increased pain at the wound site on her right leg which has gotten bigger and has started to drain again for the past 4-5 days. She denies any fever or chills. Pain is sharp and shooting at the site of the wound and is about an 8 on a 0-10 scale. She denies any issues with unusual bruising or bleeding. No headaches or dizziness related to her blood pressure.  Past Medical History:  Diagnosis Date  . Hypertension    Past Surgical History:  Procedure Laterality Date  . NO PAST SURGERIES     Social History   Tobacco Use  . Smoking status: Never Smoker  . Smokeless tobacco: Never Used  Substance Use Topics  . Alcohol use: Never    Frequency: Never  . Drug use: Never   No Known Allergies   Review of Systems     Objective:   Physical Exam Vitals signs and nursing note reviewed.  Constitutional:      General: He is in acute distress (has some wincing when walking, bearing weight on the right leg; walks with use of a crutch under one arm).     Appearance: Normal appearance.  Neck:     Musculoskeletal: Normal range of motion and neck supple. No muscular tenderness.  Cardiovascular:     Rate and Rhythm: Normal rate and regular rhythm.  Pulmonary:     Effort: Pulmonary effort is normal.     Breath sounds: Normal breath sounds.  Abdominal:     Palpations: Abdomen is soft.     Tenderness: There is no abdominal tenderness. There is no right CVA tenderness, left CVA tenderness, guarding or rebound.  Musculoskeletal:       General: Tenderness present.     Right lower leg: Edema present.     Left lower leg: No edema.     Comments: Swelling in right lower extremity just above and below area of wound/ulceration  Lymphadenopathy:     Cervical: No cervical adenopathy.  Skin:    General: Skin is warm and dry.     Findings: Lesion present.     Comments: Patient with an open wound on the right lower leg with some yellowish drainage and increased warmth on skin around wound site  Neurological:     General: No focal deficit present.     Mental Status: He is alert and oriented to person, place, and time.     Cranial Nerves: No cranial nerve deficit.  Psychiatric:        Mood and Affect: Mood normal.        Behavior: Behavior normal.    BP (!) 171/91 (BP Location: Right Arm, Patient Position: Sitting, Cuff Size: Normal)   Pulse 72   Temp 98.9 F (37.2 C) (Oral)   Resp 18   Ht 5\' 3"  (1.6 m)   Wt 186 lb (84.4 kg)   SpO2 99%   BMI 32.95 kg/m        Assessment & Plan:  1.  Thrombocytopenia (HCC) Will obtain CBC in follow-up of thrombocytopenia as she has had low platelets chronically but want to make sure that they do not drop to a critical level - CBC with Differential  2. 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 Rx for tylenol #3 for her leg pain. Will base antibiotic therapy on wound culture results. Will speak with clinic nurse coordinator see if follow-up with a wound care specialist can be arranged for patient as well as procurement of supplies for dressing changes - CBC with Differential - Basic Metabolic Panel - WOUND CULTURE - acetaminophen-codeine (TYLENOL #3) 300-30 MG tablet; Take 1-2 tablets by mouth every 6 (six) hours as needed for moderate pain.  Dispense: 60 tablet; Refill: 1  3. Essential hypertension BP elevated today which she  attributes to her leg pain. Pain medication provided and she is to continue her lisinopril-hctz and recheck BP at her follow-up visit in 2 weeks but seek medical attention if having headaches/dizziness or onset of stroke symptoms as discussed. BMP to check renal function and electrolytes - CBC with Differential  4. Leg pain, inferior, right Patient with a chronic ulceration/wound to the right lower extremity which has never completely healed. Wound is worse at today's visit and RX provided for Tylenol #3 to take as needed for pain - acetaminophen-codeine (TYLENOL #3) 300-30 MG tablet; Take 1-2 tablets by mouth every 6 (six) hours as needed for moderate pain.  Dispense: 60 tablet; Refill: 1  An After Visit Summary was printed and given to the patient.  Return in about 2 weeks (around 03/16/2018) for wound recheck.

## 2018-03-03 LAB — CBC WITH DIFFERENTIAL/PLATELET
Basophils Absolute: 0 x10E3/uL (ref 0.0–0.2)
Basos: 0 %
EOS (ABSOLUTE): 0.1 x10E3/uL (ref 0.0–0.4)
Eos: 3 %
Hematocrit: 39.9 % (ref 34.0–46.6)
Hemoglobin: 13 g/dL (ref 11.1–15.9)
Immature Grans (Abs): 0 x10E3/uL (ref 0.0–0.1)
Immature Granulocytes: 0 %
Lymphocytes Absolute: 1.9 x10E3/uL (ref 0.7–3.1)
Lymphs: 42 %
MCH: 27 pg (ref 26.6–33.0)
MCHC: 32.6 g/dL (ref 31.5–35.7)
MCV: 83 fL (ref 79–97)
Monocytes Absolute: 0.4 x10E3/uL (ref 0.1–0.9)
Monocytes: 9 %
Neutrophils Absolute: 2.1 x10E3/uL (ref 1.4–7.0)
Neutrophils: 46 %
Platelets: 131 x10E3/uL — ABNORMAL LOW (ref 150–450)
RBC: 4.81 x10E6/uL (ref 3.77–5.28)
RDW: 15.5 % — ABNORMAL HIGH (ref 11.7–15.4)
WBC: 4.6 x10E3/uL (ref 3.4–10.8)

## 2018-03-03 LAB — BASIC METABOLIC PANEL WITH GFR
BUN/Creatinine Ratio: 12 (ref 12–28)
BUN: 13 mg/dL (ref 8–27)
CO2: 22 mmol/L (ref 20–29)
Calcium: 9.6 mg/dL (ref 8.7–10.3)
Chloride: 105 mmol/L (ref 96–106)
Creatinine, Ser: 1.13 mg/dL — ABNORMAL HIGH (ref 0.57–1.00)
GFR calc Af Amer: 57 mL/min/1.73 — ABNORMAL LOW
GFR calc non Af Amer: 50 mL/min/1.73 — ABNORMAL LOW
Glucose: 85 mg/dL (ref 65–99)
Potassium: 4.1 mmol/L (ref 3.5–5.2)
Sodium: 145 mmol/L — ABNORMAL HIGH (ref 134–144)

## 2018-03-05 LAB — WOUND CULTURE: Organism ID, Bacteria: NONE SEEN

## 2018-03-06 NOTE — Congregational Nurse Program (Signed)
Brief office visit at [facility] to request assistance in changing dressing of ulcer on right lower outer ankle. Tissue granulation appearing better with pinkish color. Moderate amount of pale yellow drainage oozing on gauze. No odor. Instructed to continue using gauze and medication over ulcer; not bandaide. Concern that she did not get medication for hypertension at pharmacy. Provide gauze for dressing. Provide bus tickets for PCP appointment 03-02-18. Call [facility] to confirm medication and deliver to home. Continue to follow-up with agency SW regarding interview and transportation to [employer] on 03-06-18 and nurse office prn.

## 2018-03-07 NOTE — Telephone Encounter (Signed)
Medical Assistant used [employer] to contact patient.  Interpreter Name: Toney Reil Interpreter #: [phone] Patient verified DOB Patient is aware of platelet level being improved based on other levels. Patient aware of needing to continue with hydration to address elevated kidney function levels. Patient complains of intermittent arm pain and dizziness. Patient will follow up with the clinic on the 17th of this month but she was advised to report to the ER if the symptoms increase or persist along with numbness and heaviness in the chest.

## 2018-03-07 NOTE — Telephone Encounter (Signed)
-----   Message from Cain Saupe, MD sent at 03/03/2018  1:10 PM EST ----- On most recent blood work, patient's platelet level has increased to 131 which is still low but is much improved from prior blood counts.  Patient with continued elevation in creatinine with slight improvement.  Creatinine is 1.13 with normal of 0.57-1.0.

## 2018-03-13 ENCOUNTER — Telehealth: Payer: Self-pay | Admitting: Hematology

## 2018-03-13 ENCOUNTER — Inpatient Hospital Stay: Payer: Self-pay | Attending: Hematology | Admitting: Hematology

## 2018-03-13 ENCOUNTER — Inpatient Hospital Stay: Payer: Self-pay

## 2018-03-13 VITALS — BP 135/70 | HR 65 | Temp 98.7°F | Resp 18 | Ht 63.0 in | Wt 185.1 lb

## 2018-03-13 DIAGNOSIS — I10 Essential (primary) hypertension: Secondary | ICD-10-CM | POA: Insufficient documentation

## 2018-03-13 DIAGNOSIS — D696 Thrombocytopenia, unspecified: Secondary | ICD-10-CM | POA: Insufficient documentation

## 2018-03-13 LAB — CBC WITH DIFFERENTIAL/PLATELET
Abs Immature Granulocytes: 0.02 10*3/uL (ref 0.00–0.07)
Basophils Absolute: 0 10*3/uL (ref 0.0–0.1)
Basophils Relative: 1 %
EOS ABS: 0.1 10*3/uL (ref 0.0–0.5)
EOS PCT: 2 %
HCT: 39 % (ref 36.0–46.0)
Hemoglobin: 13 g/dL (ref 12.0–15.0)
Immature Granulocytes: 0 %
Lymphocytes Relative: 45 %
Lymphs Abs: 2.4 10*3/uL (ref 0.7–4.0)
MCH: 27.5 pg (ref 26.0–34.0)
MCHC: 33.3 g/dL (ref 30.0–36.0)
MCV: 82.6 fL (ref 80.0–100.0)
MONO ABS: 0.6 10*3/uL (ref 0.1–1.0)
Monocytes Relative: 10 %
Neutro Abs: 2.3 10*3/uL (ref 1.7–7.7)
Neutrophils Relative %: 42 %
PLATELETS: 104 10*3/uL — AB (ref 150–400)
RBC: 4.72 MIL/uL (ref 3.87–5.11)
RDW: 14.6 % (ref 11.5–15.5)
WBC: 5.4 10*3/uL (ref 4.0–10.5)
nRBC: 0 % (ref 0.0–0.2)

## 2018-03-13 LAB — CMP (CANCER CENTER ONLY)
ALT: 7 U/L (ref 0–44)
AST: 13 U/L — AB (ref 15–41)
Albumin: 3.9 g/dL (ref 3.5–5.0)
Alkaline Phosphatase: 63 U/L (ref 38–126)
Anion gap: 9 (ref 5–15)
BILIRUBIN TOTAL: 0.7 mg/dL (ref 0.3–1.2)
BUN: 27 mg/dL — AB (ref 8–23)
CO2: 29 mmol/L (ref 22–32)
CREATININE: 1.26 mg/dL — AB (ref 0.44–1.00)
Calcium: 9.6 mg/dL (ref 8.9–10.3)
Chloride: 104 mmol/L (ref 98–111)
GFR, EST NON AFRICAN AMERICAN: 43 mL/min — AB (ref >60)
GFR, Est AFR Am: 50 mL/min — ABNORMAL LOW (ref >60)
Glucose, Bld: 89 mg/dL (ref 70–99)
POTASSIUM: 4 mmol/L (ref 3.5–5.1)
Sodium: 142 mmol/L (ref 135–145)
TOTAL PROTEIN: 8.2 g/dL — AB (ref 6.5–8.1)

## 2018-03-13 LAB — IMMATURE PLATELET FRACTION: Immature Platelet Fraction: 22.3 % — ABNORMAL HIGH (ref 1.2–8.6)

## 2018-03-13 NOTE — Progress Notes (Signed)
HEMATOLOGY/ONCOLOGY CONSULTATION NOTE  Date of Service: 03/13/2018  Patient Care Team: Cain Saupe, MD as PCP - General (Family Medicine)  CHIEF COMPLAINTS/PURPOSE OF CONSULTATION:  Thrombocytopenia  HISTORY OF PRESENTING ILLNESS:   [name] [name] is a wonderful 68 y.o. adult who has been referred to [place] by Dr. Cain Saupe for evaluation and management of Thrombocytopenia. She is accompanied today by a Patent attorney. The pt reports that she is doing well overall. The pt moved from the [facility] in May 2019.   The pt reports that she has had an ulcer on her right ankle for the past several months. She began taking Augmentin on 01/25/18 with her PCP. The pt denies new medication in the past 1-2 months. She hsa taken multiple antibiotic courses in addition to Meloxicam. The pt denies any concerns for bleeding, denying nose bleeds, gum bleeds, blood in the stools, or blood in the urine. A week or so ago she had a fever with chills, and notes that this has resolved. She also endorses pain in her lower right abdomen.  The pt endorses palpitations at night when she lies down to sleep as well.  The pt denies medical problems or a history of hepatitis. She denies night sweats. She denies having a blood transfusion at any point.  She is attending a language class at [employer] currently.   Most recent lab results (03/02/18) of CBC w/diff and BMP is as follows: all values are WNL except for RDW at 15.5, PLT at 131k, Creatinine at 1.13, GFR at 50, Sodium at 145.  On review of systems, pt reports ulcer on right leg, stable energy levels, and denies nose bleeds, gum bleeds, blood in the stool, blood in the urine, night sweats, and any other symptoms.  MEDICAL HISTORY:  Past Medical History:  Diagnosis Date  . Hypertension     SURGICAL HISTORY: Past Surgical History:  Procedure Laterality Date  . NO PAST SURGERIES      SOCIAL HISTORY: Social History   Socioeconomic History  . Marital  status: Widowed    Spouse name: Not on file  . Number of children: Not on file  . Years of education: Not on file  . Highest education level: Not on file  Occupational History  . Not on file  Social Needs  . Financial resource strain: Not on file  . Food insecurity:    Worry: Not on file    Inability: Not on file  . Transportation needs:    Medical: Not on file    Non-medical: Not on file  Tobacco Use  . Smoking status: Never Smoker  . Smokeless tobacco: Never Used  Substance and Sexual Activity  . Alcohol use: Never    Frequency: Never  . Drug use: Never  . Sexual activity: Not Currently  Lifestyle  . Physical activity:    Days per week: Not on file    Minutes per session: Not on file  . Stress: Not on file  Relationships  . Social connections:    Talks on phone: Not on file    Gets together: Not on file    Attends religious service: Not on file    Active member of club or organization: Not on file    Attends meetings of clubs or organizations: Not on file    Relationship status: Not on file  . Intimate partner violence:    Fear of current or ex partner: Not on file    Emotionally abused: Not on file  Physically abused: Not on file    Forced sexual activity: Not on file  Other Topics Concern  . Not on file  Social History Narrative  . Not on file    FAMILY HISTORY: Family History  Family history unknown: Yes    ALLERGIES:  has No Known Allergies.  MEDICATIONS:  Current Outpatient Medications  Medication Sig Dispense Refill  . acetaminophen-codeine (TYLENOL #3) 300-30 MG tablet Take 1-2 tablets by mouth every 6 (six) hours as needed for moderate pain. 60 tablet 1  . Calcium Carbonate (CALCIUM 600 PO) Take 1,200 mg by mouth 2 (two) times daily.     Marland Kitchen. lisinopril-hydrochlorothiazide (PRINZIDE,ZESTORETIC) 20-25 MG tablet Take 1 tablet by mouth daily. To lower blood pressure 30 tablet 6  . meloxicam (MOBIC) 7.5 MG tablet Take 7.5 mg by mouth daily.    .  mupirocin ointment (BACTROBAN) 2 % Apply bid to wound 22 g 0  . UNKNOWN TO PATIENT Unnamed ACE inhibitor- Take 10 mg by mouth once a day for blood pressure/ends in "-pril"     No current facility-administered medications for this visit.     REVIEW OF SYSTEMS:    10 Point review of Systems was done is negative except as noted above.  PHYSICAL EXAMINATION:  . Vitals:   03/13/18 1334  BP: 135/70  Pulse: 65  Resp: 18  Temp: 98.7 F (37.1 C)  SpO2: 97%   Filed Weights   03/13/18 1334  Weight: 185 lb 1.6 oz (84 kg)   .Body mass index is 32.79 kg/m.  GENERAL:alert, in no acute distress and comfortable SKIN: no acute rashes, no significant lesions EYES: conjunctiva are pink and non-injected, sclera anicteric OROPHARYNX: MMM, no exudates, no oropharyngeal erythema or ulceration NECK: supple, no JVD LYMPH:  no palpable lymphadenopathy in the cervical, axillary or inguinal regions LUNGS: clear to auscultation b/l with normal respiratory effort HEART: regular rate & rhythm ABDOMEN:  normoactive bowel sounds , non tender, not distended. Extremity: no pedal edema, ulcer on outer aspect of right ankle PSYCH: alert & oriented x 3 with fluent speech NEURO: no focal motor/sensory deficits  LABORATORY DATA:  I have reviewed the data as listed  . CBC Latest Ref Rng & Units 03/13/2018 03/02/2018 02/01/2018  WBC 4.0 - 10.5 K/uL 5.4 4.6 5.7  Hemoglobin 12.0 - 15.0 g/dL 16.113.0 09.613.0 04.512.9  Hematocrit 36.0 - 46.0 % 39.0 39.9 38.5  Platelets 150 - 400 K/uL 104(L) 131(L) 104(L)    . CMP Latest Ref Rng & Units 03/13/2018 03/02/2018 02/01/2018  Glucose 70 - 99 mg/dL 89 85 91  BUN 8 - 23 mg/dL 40(J27(H) 13 20  Creatinine 0.44 - 1.00 mg/dL 8.11(B1.26(H) 1.47(W1.13(H) 2.95(A1.21(H)  Sodium 135 - 145 mmol/L 142 145(H) 143  Potassium 3.5 - 5.1 mmol/L 4.0 4.1 3.8  Chloride 98 - 111 mmol/L 104 105 103  CO2 22 - 32 mmol/L 29 22 22   Calcium 8.9 - 10.3 mg/dL 9.6 9.6 9.3  Total Protein 6.5 - 8.1 g/dL 8.2(H) - -  Total  Bilirubin 0.3 - 1.2 mg/dL 0.7 - -  Alkaline Phos 38 - 126 U/L 63 - -  AST 15 - 41 U/L 13(L) - -  ALT 0 - 44 U/L 7 - -   Component     Latest Ref Rng & Units 03/13/2018  HIV Screen 4th Generation wRfx     Non Reactive Non Reactive  Hepatitis B Surface Ag     Negative Negative  Hep B Core Ab, Tot  Negative Negative  HCV Ab     0.0 - 0.9 s/co ratio <0.1  Immature Platelet Fraction     1.2 - 8.6 % 22.3 (H)    RADIOGRAPHIC STUDIES: I have personally reviewed the radiological images as listed and agreed with the findings in the report. No results found.  ASSESSMENT & PLAN:   68 y.o. female with   1. Mild Thrombocytopenia - Idiopathic thrombocytopenia of undetermined significance. PLAN -Discussed patient's most recent labs from 03/02/18, PLT at 131k, no other cytopenias. -Reviewed previous PLT values: 10/03/17 PLT at 113k, 12/11/17 PLT at 118k, 01/25/18 PLT at 97k, 02/01/18 PLT at 104k -Her PLT have nearly normalized without intervention or therapy -Thrombocytopenia in setting of chronic ulcer, antibiotic courses, and Meloxicam use  -Will repeat blood tests today- PLT 104k. Hepatitis profile neg. HIV neg. -recommended patient avoid all NSAIDS  -elevated immature platelet fraction suggest peripheral platelet destruction. ? Immune vs splenomegaly -recommend US abd to evalute for splenomegaly with PCP -would recommend monitor Platelets q276months with PCP and reconsulting us again if <70k. -no indication for rx at this time.  -Continue follow up with PCP for management of other medical concerns    Labs today RTC with Dr Candise CheKale if needed    All of the patients questions were answered with apparent satisfaction. The patient knows to call the clinic with any problems, questions or concerns.  The total time spent in the appt was 30 minutes and more than 50% was on counseling and direct patient cares.    Wyvonnia LoraGautam Toniesha Zellner MD MS AAHIVMS [facility] Hematology/Oncology Physician [facility]  (Office):       [phone] (Work cell):  [phone] (Fax):           [phone]  03/13/2018 2:02 PM  I, Marcelline MatesSchuyler Bain, am acting as a scribe for Dr. Wyvonnia LoraGautam Chara Marquard.   .I have reviewed the above documentation for accuracy and completeness, and I agree with the above. Johney Maine.Maley Venezia Kishore Anan Dapolito MD

## 2018-03-13 NOTE — Telephone Encounter (Signed)
Scheduled appt per 2/11 los.  RTC with Dr Candise Che if needed based on labs

## 2018-03-14 LAB — HEPATITIS B CORE ANTIBODY, TOTAL: Hep B Core Total Ab: NEGATIVE

## 2018-03-14 LAB — HIV ANTIBODY (ROUTINE TESTING W REFLEX): HIV Screen 4th Generation wRfx: NONREACTIVE

## 2018-03-14 LAB — HEPATITIS C ANTIBODY: HCV Ab: <0.1 s/co ratio (ref 0.0–0.9)

## 2018-03-14 LAB — HEPATITIS B SURFACE ANTIGEN: HEP B S AG: NEGATIVE

## 2018-03-19 ENCOUNTER — Ambulatory Visit: Payer: Self-pay | Attending: Family Medicine

## 2018-03-19 ENCOUNTER — Ambulatory Visit: Payer: Self-pay | Admitting: Family Medicine

## 2018-03-20 NOTE — Progress Notes (Signed)
Patient ID: [name] [name], adult   DOB: May 13, 1950, 68 y.o.   MRN: [id number]      [name] [name], is a 68 y.o. adult  HQP:[id number]  ZLD:[id number]  DOB - [birth year]  Subjective:  Chief Complaint and HPI: [name] [name] is a 68 y.o. adult here today for a follow up visit for wound recheck. Seen by hematology 03/13/2018 for thrombocytopenia.  Recommended No NSAIDS.  Phone interpreters translating.  Also needs cream for dry skin. No recent falls or bleeding.  Wants a referrral due to ongoing B knee pain.  Part of A/P: elevated immature platelet fraction suggest peripheral platelet destruction. ? Immune vs splenomegaly -recommend [place] abd to evalute for splenomegaly with PCP -would recommend monitor Platelets q61months with PCP and reconsulting [place] again if <70k. -no indication for rx at this time.   ED/Hospital notes reviewed.    ROS:   Constitutional:  No f/c, No night sweats, No unexplained weight loss. EENT:  No vision changes, No blurry vision, No hearing changes. No mouth, throat, or ear problems.  Respiratory: No cough, No SOB Cardiac: No CP, no palpitations GI:  No abd pain, No N/V/D. GU: No Urinary s/sx Musculoskeletal: B knee pain Neuro: No headache, no dizziness, no motor weakness.  Skin: dry skin face and arms Endocrine:  No polydipsia. No polyuria.  Psych: Denies SI/HI  No problems updated.  ALLERGIES: No Known Allergies  PAST MEDICAL HISTORY: Past Medical History:  Diagnosis Date  . Hypertension     MEDICATIONS AT HOME: Prior to Admission medications   Medication Sig Start Date End Date Taking? Authorizing Provider  acetaminophen-codeine (TYLENOL #3) 300-30 MG tablet Take 1-2 tablets by mouth every 6 (six) hours as needed for moderate pain. 03/02/18  Yes Fulp, Cammie, MD  Calcium Carbonate (CALCIUM 600 PO) Take 1,200 mg by mouth 2 (two) times daily.    Yes [provider]  lisinopril-hydrochlorothiazide (PRINZIDE,ZESTORETIC) 20-25 MG tablet Take  1 tablet by mouth daily. To lower blood pressure 12/11/17  Yes Fulp, Cammie, MD  meloxicam (MOBIC) 7.5 MG tablet Take 7.5 mg by mouth daily.   Yes [provider]  mupirocin ointment (BACTROBAN) 2 % Apply bid to wound 02/01/18  Yes McClung, Angela M, PA-C  UNKNOWN TO PATIENT Unnamed ACE inhibitor- Take 10 mg by mouth once a day for blood pressure/ends in "-pril"   Yes [provider]  triamcinolone cream (KENALOG) 0.1 % Apply to dry skin 1-2 times daily sparingly 03/21/18   Anders Simmonds, PA-C     Objective:  EXAM:   Vitals:   03/21/18 1343  BP: 131/77  Pulse: 68  Resp: 18  Temp: 98.9 F (37.2 C)  TempSrc: Oral  SpO2: 99%  Weight: 183 lb (83 kg)  Height: 5\' 4"  (1.626 m)    General appearance : A&OX3. NAD. Non-toxic-appearing HEENT: Atraumatic and Normocephalic.  PERRLA. EOM intact.  Neck: supple, no JVD. No cervical lymphadenopathy. No thyromegaly Chest/Lungs:  Breathing-non-labored, Good air entry bilaterally, breath sounds normal without rales, rhonchi, or wheezing  CVS: S1 S2 regular, no murmurs, gallops, rubs  Extremities: Bilateral Lower Ext shows no edema, both legs are warm to touch with = pulse throughout Neurology:  CN II-XII grossly intact, Non focal.   R lower leg wound-much much improved. Open area is almost closed completely.   Wound care and new dressing performed Psych:  TP linear. J/I WNL. Normal speech. Appropriate eye contact and affect.  Skin:  Dry skin with some hyperpigmentation sides of face.  Also  on arms  Data Review No results found for: HGBA1C   Assessment & Plan   1. Dry skin - triamcinolone cream (KENALOG) 0.1 %; Apply to dry skin 1-2 times daily sparingly  Dispense: 30 g; Refill: 2  2. Primary osteoarthritis of both knees Taking tramadol.  Stop meloxicam - Ambulatory referral to Orthopedic Surgery  3. Thrombocytopenia (HCC) - US Abdomen Complete; Future -see recent hematology note  4. Language barrier Phone  interpreters used and additional time performing visit was required.    Patient have been counseled extensively about nutrition and exercise  Return in about 1 month (around 04/19/2018) for Dr Fulp-recheck blood work and leg.  The patient was given clear instructions to go to ER or return to medical center if symptoms don't improve, worsen or new problems develop. The patient verbalized understanding. The patient was told to call to get lab results if they haven't heard anything in the next week.     Georgian Co, PA-C [facility] [place], [place] [phone]   03/21/2018, 1:56 PM

## 2018-03-21 ENCOUNTER — Ambulatory Visit: Payer: Self-pay | Attending: Family Medicine | Admitting: Physician Assistant

## 2018-03-21 VITALS — BP 131/77 | HR 68 | Temp 98.9°F | Resp 18 | Ht 64.0 in | Wt 183.0 lb

## 2018-03-21 DIAGNOSIS — M170 Bilateral primary osteoarthritis of knee: Secondary | ICD-10-CM

## 2018-03-21 DIAGNOSIS — D696 Thrombocytopenia, unspecified: Secondary | ICD-10-CM

## 2018-03-21 DIAGNOSIS — L853 Xerosis cutis: Secondary | ICD-10-CM

## 2018-03-21 DIAGNOSIS — Z789 Other specified health status: Secondary | ICD-10-CM

## 2018-03-21 MED ORDER — TRIAMCINOLONE ACETONIDE 0.1 % EX CREA: TOPICAL_CREAM | CUTANEOUS | 2 refills | Status: DC

## 2018-03-22 ENCOUNTER — Telehealth: Payer: Self-pay

## 2018-03-22 NOTE — Telephone Encounter (Signed)
This CM spoke to [place] RN/IRC regarding completion of SCAT application.  She will obtain patient's signature to authorizer release of medical information

## 2018-03-26 ENCOUNTER — Telehealth: Payer: Self-pay

## 2018-03-26 NOTE — Telephone Encounter (Signed)
Call received from [facility] noting that she will obtain the signed release for SCAT from the patient and will complete Part B of the application and take it to SCAT.

## 2018-04-01 ENCOUNTER — Other Ambulatory Visit: Payer: Self-pay | Admitting: Family Medicine

## 2018-04-01 DIAGNOSIS — D696 Thrombocytopenia, unspecified: Secondary | ICD-10-CM

## 2018-04-01 NOTE — Progress Notes (Signed)
Please let patient know that based on her visit with hematology, it was recommended that she have blood work over the next 6 months to make sure that her platelet count remains stable/within normal. Please ask patient to make arrangements to come to the office once per month to have a CBC in follow-up of her platelets. Hematology also suggested that patient have an [place] of her abdomen to see if her spleen is enlarged (the spleen helps remove old blood cells from the body). An order has been placed for the monthly CBC's as well as for the abdominal US

## 2018-04-01 NOTE — Progress Notes (Signed)
Patient ID: [name] [name], adult   DOB: October 30, 1950, 68 y.o.   MRN: [id number]     Patient is status post appointment with hematology in follow-up of low platelet count/thrombocytopenia and hematology would like patient to have an [place] to see if she has any enlargement of her spleen as well as to periodically check her platelets over the next 6 months and if 70K or lower she will need to follow-up with hematology again but for now her platelets have normalized.

## 2018-04-03 NOTE — Congregational Nurse Program (Signed)
Visit to [facility] nurse office to request evaluation of dressing and lesion on right lower outer leg. Dressing intact from last visit and clean. Presented medication for lesion.No drainage or oozing. Appearance much improved with drying over area with firm scab. Gauze dressing applied over area with medication. Very much aware of upcoming medical appointments. Concerned about medical bills incurred over time. Continues to express concern about pain in right knee. Requested to use walker available in office for walking to bus stops. Provided taxi to medical appointment due to weather today. Referred back to agency social worker to resolve billing issues and Immigration questions while awaiting Asylum status. Return prn. Ferol Luz, RN/CN [phone].

## 2018-04-11 NOTE — Congregational Nurse Program (Signed)
Visit to nurse office to request dressing and evaluation of lesion on right lower outer ankle. No dressing present; healing over with thick dark crusty scab without drainage as before. Shrinking slightly in size. Used all medication prescribed for skin ulcer. Rash around eyes improved with medication applied. Main concern today also is pain in right great toe and right knee. Need medication refills at pharmacy.  Describes problem walking especially to bus stops. Continues to use cane with arm brace and struggling to walk. Very emotional today and crying during visit explaining frustration with housing problem and finances. Requesting assistance in finding a affordable room or residence. Obviously very unhappy in present situation. Continues to await information from Immigration office regarding status. Applied for [employer] transportation and waiting for confirmation. No extended family members identified for assistance. Right lower leg ulcer area cleansed with antibacterial soap and thin dressing applied. Urged to use walker much as possible. Check SCAT application. Explore housing options with SW again at [facility]. Assist with transport to next appointment at [facility] 04/23/18 at 1:15 pm. Assist getting prescriptions.  Reassured as much as possible with situation and return prn. Ferol Luz, RN/CN

## 2018-04-17 ENCOUNTER — Other Ambulatory Visit: Payer: Self-pay

## 2018-04-17 ENCOUNTER — Ambulatory Visit: Payer: Self-pay | Attending: Family Medicine

## 2018-04-23 ENCOUNTER — Ambulatory Visit: Payer: Self-pay | Admitting: Family Medicine

## 2018-05-03 DIAGNOSIS — I10 Essential (primary) hypertension: Secondary | ICD-10-CM | POA: Insufficient documentation

## 2018-05-25 ENCOUNTER — Other Ambulatory Visit: Payer: Self-pay

## 2018-05-25 ENCOUNTER — Encounter: Payer: Self-pay | Admitting: Family Medicine

## 2018-05-25 ENCOUNTER — Ambulatory Visit: Payer: Self-pay | Attending: Family Medicine | Admitting: Family Medicine

## 2018-05-25 VITALS — BP 170/90 | HR 61 | Temp 99.1°F | Ht 64.0 in | Wt 190.6 lb

## 2018-05-25 DIAGNOSIS — M25561 Pain in right knee: Secondary | ICD-10-CM

## 2018-05-25 DIAGNOSIS — I10 Essential (primary) hypertension: Secondary | ICD-10-CM

## 2018-05-25 DIAGNOSIS — M25562 Pain in left knee: Secondary | ICD-10-CM

## 2018-05-25 DIAGNOSIS — N289 Disorder of kidney and ureter, unspecified: Secondary | ICD-10-CM

## 2018-05-25 DIAGNOSIS — D696 Thrombocytopenia, unspecified: Secondary | ICD-10-CM

## 2018-05-25 DIAGNOSIS — G8929 Other chronic pain: Secondary | ICD-10-CM

## 2018-05-25 MED ORDER — DICLOFENAC SODIUM 1 % TD GEL: 4.0000 g | Freq: Four times a day (QID) | TRANSDERMAL | 11 refills | Status: DC

## 2018-05-25 MED ORDER — ACETAMINOPHEN-CODEINE #3 300-30 MG PO TABS: 1.0000 | ORAL_TABLET | Freq: Four times a day (QID) | ORAL | 1 refills | Status: DC | PRN

## 2018-05-25 MED ORDER — LISINOPRIL-HYDROCHLOROTHIAZIDE 20-25 MG PO TABS: 1.0000 | ORAL_TABLET | Freq: Every day | ORAL | 6 refills | Status: DC

## 2018-05-25 NOTE — Progress Notes (Signed)
Follow up for her Right Knee  Per pt she is not able to stand up well for the past 1 year  Per patient she fell last week because she did not have her crush in reach

## 2018-05-25 NOTE — Progress Notes (Signed)
Established Patient Office Visit  Subjective:  Patient ID: [name] [name], adult    DOB: Mar 08, 1950  Age: 68 y.o. MRN: [id number]   Due to a language barrier, audio interpretation system was used at today's visit. Patient is also accompanied by a care coordinator at today's visit  CC: knee pain  HPI Veronque Debe Anfinson presents for chronic issues with knee pain and follow-up of Hypertension. She reports that she has pain in both knees but the pain is greater in the right knee and her right knee suddenly gives away at times causing her to fall. She last fell at her home about 2 weeks ago and since then her right knee has been more painful and more swollen. (Patient could not quantify the level of pain). Pain is sharp. Patient reports that the wound/ulcer on her right lower leg has healed but she has a little bit of swelling in her right lower leg.       Patient has been out of her blood pressure medication for the past 2 weeks. She has had some occasional dull headaches since being out of her medication but no dizziness. Her main concern is her knee pain and she feels that one leg is not exactly straight and she wants to see someone who can fix her leg and knee pain so that she can walk better. She did see the specialist about her blood (low platelets) but she does not really recall what she was told. She denies any unusual bruising or bleeding.   Past Medical History:  Diagnosis Date  . Hypertension     Past Surgical History:  Procedure Laterality Date  . NO PAST SURGERIES      Family History  Family history unknown: Yes    Social History   Tobacco Use  . Smoking status: Never Smoker  . Smokeless tobacco: Never Used  Substance Use Topics  . Alcohol use: Never    Frequency: Never  . Drug use: Never    Outpatient Medications Prior to Visit  Medication Sig Dispense Refill  . acetaminophen-codeine (TYLENOL #3) 300-30 MG tablet Take 1-2 tablets by mouth every 6 (six)  hours as needed for moderate pain. 60 tablet 1  . Calcium Carbonate (CALCIUM 600 PO) Take 1,200 mg by mouth 2 (two) times daily.     Marland Kitchen lisinopril-hydrochlorothiazide (PRINZIDE,ZESTORETIC) 20-25 MG tablet Take 1 tablet by mouth daily. To lower blood pressure 30 tablet 6  . mupirocin ointment (BACTROBAN) 2 % Apply bid to wound 22 g 0  . triamcinolone cream (KENALOG) 0.1 % Apply to dry skin 1-2 times daily sparingly 30 g 2  . UNKNOWN TO PATIENT Unnamed ACE inhibitor- Take 10 mg by mouth once a day for blood pressure/ends in "-pril"     No facility-administered medications prior to visit.     No Known Allergies  ROS Review of Systems  Constitutional: Positive for fatigue. Negative for chills and fever.  HENT: Negative for congestion and sore throat.   Respiratory: Negative for cough and shortness of breath.   Cardiovascular: Positive for leg swelling (mild right lower leg swelling since out of BP medication). Negative for chest pain and palpitations.  Gastrointestinal: Negative for abdominal pain, constipation, diarrhea and nausea.  Endocrine: Negative for polydipsia, polyphagia and polyuria.  Genitourinary: Negative for dysuria and frequency.  Musculoskeletal: Positive for arthralgias, gait problem and joint swelling.  Neurological: Positive for headaches. Negative for dizziness.  Hematological: Negative for adenopathy. Does not bruise/bleed easily.  Objective:    BP (!) 170/90 (BP Location: Right Arm, Patient Position: Sitting, Cuff Size: Normal)   Pulse 61   Temp 99.1 F (37.3 C) (Oral)   Ht 5\' 4"  (1.626 m)   Wt 190 lb 9.6 oz (86.5 kg)   SpO2 97%   BMI 32.72 kg/m  Physical Exam  Constitutional: He is oriented to person, place, and time. He appears well-developed and well-nourished.  Cardiovascular: Normal rate and regular rhythm.  Pulmonary/Chest: Effort normal and breath sounds normal.  Abdominal: Soft. There is no abdominal tenderness. There is no rebound and no  guarding.  Musculoskeletal:        General: Tenderness (generalized right knee pain but joint pain on left is mostly along joint lines) and edema (generalized edema of the right knee) present.  Neurological: He is alert and oriented to person, place, and time.  Skin: Skin is warm and dry.  Prior ulceration/open wound on the right lower leg has resolved and now with small quarter-sized hypopigmented area  Psychiatric: He has a normal mood and affect. His behavior is normal. Thought content normal.  Nursing note and vitals reviewed.   Ht 5\' 4"  (1.626 m)   BMI 31.41 kg/m  Wt Readings from Last 3 Encounters:  03/21/18 183 lb (83 kg)  03/13/18 185 lb 1.6 oz (84 kg)  03/02/18 186 lb (84.4 kg)    Lab Results  Component Value Date   WBC 5.4 03/13/2018   HGB 13.0 03/13/2018   HCT 39.0 03/13/2018   MCV 82.6 03/13/2018   PLT 104 (L) 03/13/2018   Lab Results  Component Value Date   NA 142 03/13/2018   K 4.0 03/13/2018   CO2 29 03/13/2018   GLUCOSE 89 03/13/2018   BUN 27 (H) 03/13/2018   CREATININE 1.26 (H) 03/13/2018   BILITOT 0.7 03/13/2018   ALKPHOS 63 03/13/2018   AST 13 (L) 03/13/2018   ALT 7 03/13/2018   PROT 8.2 (H) 03/13/2018   ALBUMIN 3.9 03/13/2018   CALCIUM 9.6 03/13/2018   ANIONGAP 9 03/13/2018      Assessment & Plan:  1. Essential hypertension BP elevated today as patient reports that she has been out of her blood pressure medication for the past 2 weeks. New RX sent to pharmacy and she can pick up medication today to restart. Return in 2-3 weeks for nurse visit for BP recheck and BMP to assess renal function and electrolytes - lisinopril-hydrochlorothiazide (ZESTORETIC) 20-25 MG tablet; Take 1 tablet by mouth daily. To lower blood pressure  Dispense: 30 tablet; Refill: 6 - Basic Metabolic Panel  2. Thrombocytopenia (HCC) Patient did see Hematology in follow-up of thrombocytopenia. Abdominal US to look for splenomegaly was suggested and this will be ordered and  will recheck CBC - US Abdomen Complete; Future - CBC with Differential  3. Chronic pain of both knees Patient likely with primary osteoarthritis of both knees but she is also s/p recent fall at home with increased pain and swelling in the right knee. See is also supposed to avoid the use of NSAID's due to her low platelets and Cr has been slightly increased on recent labs.  RX for tylenol #3 for her current knee pain and referral to Orthopedics. RX also for voltaren gel to apply to the knees up to 4 times daily to help with pain - AMB referral to orthopedics - acetaminophen-codeine (TYLENOL #3) 300-30 MG tablet; Take 1-2 tablets by mouth every 6 (six) hours as needed for moderate pain.  Dispense:  60 tablet; Refill: 1 - diclofenac sodium (VOLTAREN) 1 % GEL; Apply 4 g topically 4 (four) times daily. To the knees to reduce pain  Dispense: 2 Tube; Refill: 11  4. Renal insufficiency Cr has been slightly increased on recent BMP's and will recheck at today's visit.  Last Cr done 03/13/18 and was 1.26 - Basic Metabolic Panel  An After Visit Summary was printed and given to the patient.  Allergies as of 05/25/2018   No Known Allergies     Medication List       Accurate as of May 25, 2018  6:19 PM. Always use your most recent med list.        acetaminophen-codeine 300-30 MG tablet Commonly known as:  TYLENOL #3 Take 1-2 tablets by mouth every 6 (six) hours as needed for moderate pain.   CALCIUM 600 PO Take 1,200 mg by mouth 2 (two) times daily.   diclofenac sodium 1 % Gel Commonly known as:  VOLTAREN Apply 4 g topically 4 (four) times daily. To the knees to reduce pain   lisinopril-hydrochlorothiazide 20-25 MG tablet Commonly known as:  ZESTORETIC Take 1 tablet by mouth daily. To lower blood pressure   mupirocin ointment 2 % Commonly known as:  BACTROBAN Apply bid to wound   triamcinolone cream 0.1 % Commonly known as:  KENALOG Apply to dry skin 1-2 times daily sparingly    UNKNOWN TO PATIENT Unnamed ACE inhibitor- Take 10 mg by mouth once a day for blood pressure/ends in "-pril"       Follow-up: Return in about 4 months (around 09/24/2018) for HTN- nurse visit for BP check in 3-4 weeks; 4 months.   Cain Saupeammie Kristyl [place], MD

## 2018-05-26 LAB — CBC WITH DIFFERENTIAL/PLATELET
Basophils Absolute: 0 x10E3/uL (ref 0.0–0.2)
Basos: 1 %
EOS (ABSOLUTE): 0.1 x10E3/uL (ref 0.0–0.4)
Eos: 2 %
Hematocrit: 36.3 % (ref 34.0–46.6)
Hemoglobin: 11.9 g/dL (ref 11.1–15.9)
Immature Grans (Abs): 0 x10E3/uL (ref 0.0–0.1)
Immature Granulocytes: 1 %
Lymphocytes Absolute: 2.3 x10E3/uL (ref 0.7–3.1)
Lymphs: 37 %
MCH: 28 pg (ref 26.6–33.0)
MCHC: 32.8 g/dL (ref 31.5–35.7)
MCV: 85 fL (ref 79–97)
Monocytes Absolute: 0.7 x10E3/uL (ref 0.1–0.9)
Monocytes: 11 %
Neutrophils Absolute: 3.1 x10E3/uL (ref 1.4–7.0)
Neutrophils: 48 %
Platelets: 99 x10E3/uL — CL (ref 150–450)
RBC: 4.25 x10E6/uL (ref 3.77–5.28)
RDW: 14.5 % (ref 11.7–15.4)
WBC: 6.2 x10E3/uL (ref 3.4–10.8)

## 2018-05-26 LAB — BASIC METABOLIC PANEL WITH GFR
BUN/Creatinine Ratio: 18 (ref 12–28)
BUN: 19 mg/dL (ref 8–27)
CO2: 23 mmol/L (ref 20–29)
Calcium: 9.1 mg/dL (ref 8.7–10.3)
Chloride: 105 mmol/L (ref 96–106)
Creatinine, Ser: 1.07 mg/dL — ABNORMAL HIGH (ref 0.57–1.00)
GFR calc Af Amer: 61 mL/min/1.73
GFR calc non Af Amer: 53 mL/min/1.73 — ABNORMAL LOW
Glucose: 90 mg/dL (ref 65–99)
Potassium: 4.2 mmol/L (ref 3.5–5.2)
Sodium: 143 mmol/L (ref 134–144)

## 2018-05-31 ENCOUNTER — Telehealth: Payer: Self-pay | Admitting: *Deleted

## 2018-05-31 ENCOUNTER — Ambulatory Visit (INDEPENDENT_AMBULATORY_CARE_PROVIDER_SITE_OTHER): Payer: Self-pay | Admitting: Orthopedic Surgery

## 2018-05-31 NOTE — Telephone Encounter (Signed)
Patient Ultrasound of the Abdomen is scheduled for June 5th at 10am

## 2018-05-31 NOTE — Telephone Encounter (Signed)
Called [name](patient case worker) and informed her with patient Abd Ultrasound date, time and location and pre-ultrasound instructions. Per Mrs. Early, she will let patient know this information and she verbalized understanding.

## 2018-06-04 ENCOUNTER — Encounter: Payer: Self-pay | Admitting: Orthopedic Surgery

## 2018-06-04 ENCOUNTER — Ambulatory Visit: Payer: Self-pay

## 2018-06-04 ENCOUNTER — Other Ambulatory Visit: Payer: Self-pay | Admitting: Family Medicine

## 2018-06-04 ENCOUNTER — Other Ambulatory Visit: Payer: Self-pay

## 2018-06-04 ENCOUNTER — Ambulatory Visit (INDEPENDENT_AMBULATORY_CARE_PROVIDER_SITE_OTHER): Payer: Self-pay | Admitting: Orthopedic Surgery

## 2018-06-04 ENCOUNTER — Ambulatory Visit (INDEPENDENT_AMBULATORY_CARE_PROVIDER_SITE_OTHER): Payer: Self-pay

## 2018-06-04 DIAGNOSIS — M25562 Pain in left knee: Secondary | ICD-10-CM

## 2018-06-04 DIAGNOSIS — M25561 Pain in right knee: Secondary | ICD-10-CM

## 2018-06-04 DIAGNOSIS — M1712 Unilateral primary osteoarthritis, left knee: Secondary | ICD-10-CM

## 2018-06-04 DIAGNOSIS — Z01818 Encounter for other preprocedural examination: Secondary | ICD-10-CM

## 2018-06-04 DIAGNOSIS — M1711 Unilateral primary osteoarthritis, right knee: Secondary | ICD-10-CM

## 2018-06-04 NOTE — Progress Notes (Signed)
Office Visit Note   Patient: [name] [name]           Date of Birth: 08-16-1950           MRN: [id number] Visit Date: 06/04/2018 Requested by: Cain Saupe, MD [address] [place], [place] [address] PCP: Cain Saupe, MD  Subjective: No chief complaint on file.   HPI: Patient presents for evaluation of bilateral knee pain.  Is been going on for years but worse over the past 8 months.  She is here with a Nurse, learning disability.  She does use a cane.  She reports decreased walking endurance.  Takes Tylenol for symptoms.  She is not currently working and has not worked since she has been here.  She does not have a history of diabetes.  She is seen in the wellness clinic and has a primary care provider.              ROS: All systems reviewed are negative as they relate to the chief complaint within the history of present illness.  Patient denies  fevers or chills.   Assessment & Plan: Visit Diagnoses:  1. Pain in both knees, unspecified chronicity   2. Unilateral primary osteoarthritis, left knee   3. Unilateral primary osteoarthritis, right knee     Plan: Impression is end-stage bilateral knee arthritis right worse than left.  Plan is that the left knee is less symptomatic than the right.  I think the right knee only has surgery as an option for pain relief.  I do not think bracing would necessarily be effective in her case.  Plan at this time is for dental evaluation prior to any type of total knee replacement.  We may have to do a constrained type of knee replacement on that right knee based on the amount of deformity present.  Follow-Up Instructions: No follow-ups on file.   Orders:  Orders Placed This Encounter  Procedures  . XR Knee 1-2 Views Left  . XR Knee 1-2 Views Right   No orders of the defined types were placed in this encounter.     Procedures: No procedures performed   Clinical Data: No additional findings.  Objective: Vital Signs: There were no vitals  taken for this visit.  Physical Exam:   Constitutional: Patient appears well-developed HEENT:  Head: Normocephalic Eyes:EOM are normal Neck: Normal range of motion Cardiovascular: Normal rate Pulmonary/chest: Effort normal Neurologic: Patient is alert Skin: Skin is warm Psychiatric: Patient has normal mood and affect    Ortho Exam: Ortho exam demonstrates varus alignment bilateral lower extremities right worse than left.  Patient does have varus thrust when she walks on the right.  Pedal pulses palpable.  There is some venous stasis changes on that right foot.  Extensor mechanism is intact bilaterally and there is no groin pain with internal X rotation of either leg.  No other masses lymphadenopathy or skin changes noted in the leg or knee region.  Flexion is easily past 90 in both knees.  Specialty Comments:  No specialty comments available.  Imaging: Xr Knee 1-2 Views Left  Result Date: 06/04/2018 AP lateral left knee reviewed.  Varus alignment is present.  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  No fracture dislocation is seen.  Xr Knee 1-2 Views Right  Result Date: 06/04/2018 AP lateral right knee reviewed.  Severe varus alignment is noted with end-stage tricompartmental arthritis worse in the medial compartment present.  No acute fracture or dislocation  is seen.  Bone quality appears reasonable.    PMFS History: Patient Active Problem List   Diagnosis Date Noted  . Essential hypertension, benign 05/03/2018  . Hypertension 10/24/2017  . Knee osteoarthritis 10/24/2017   Past Medical History:  Diagnosis Date  . Hypertension     Family History  Family history unknown: Yes    Past Surgical History:  Procedure Laterality Date  . NO PAST SURGERIES     Social History   Occupational History  . Not on file  Tobacco Use  . Smoking status: Never Smoker  . Smokeless tobacco: Never Used  Substance and Sexual Activity   . Alcohol use: Never    Frequency: Never  . Drug use: Never  . Sexual activity: Not Currently

## 2018-06-04 NOTE — Progress Notes (Signed)
Patient ID: [name] [name], adult   DOB: December 12, 1950, 68 y.o.   MRN: [id number]   Patient was seen by Orthopedics and a knee replacement is suggested but patient needs to first be seen by a dentist and treated for any dental infections. Dental referral placed

## 2018-06-18 ENCOUNTER — Other Ambulatory Visit: Payer: Self-pay | Admitting: Family Medicine

## 2018-06-18 ENCOUNTER — Ambulatory Visit: Payer: Self-pay | Attending: Family Medicine | Admitting: Emergency Medicine

## 2018-06-18 ENCOUNTER — Other Ambulatory Visit: Payer: Self-pay

## 2018-06-18 DIAGNOSIS — Z79899 Other long term (current) drug therapy: Secondary | ICD-10-CM

## 2018-06-18 DIAGNOSIS — I10 Essential (primary) hypertension: Secondary | ICD-10-CM

## 2018-06-18 MED ORDER — AMLODIPINE BESYLATE 5 MG PO TABS: 5.0000 mg | ORAL_TABLET | Freq: Every day | ORAL | 3 refills | Status: DC

## 2018-06-18 NOTE — Progress Notes (Signed)
Patient arrived ambulatory with a cane, alert and orientated to clinic.  Patient is in clinic for blood pressure check.    Pressure taken.  175/84  Dr. Jillyn Hidden informed.

## 2018-06-18 NOTE — Progress Notes (Signed)
Patient ID: [name] [name], adult   DOB: March 22, 1950, 68 y.o.   MRN: [id number]   Patient here for nurse visit for BP check but her BP is still elevated after starting lisinopril-hctz at 20-25 mg. I will have patient start amolodipine at 5 mg once per day but also have her obtain BMP today after recent new medication start

## 2018-06-19 LAB — BASIC METABOLIC PANEL WITH GFR
BUN/Creatinine Ratio: 19 (ref 12–28)
BUN: 23 mg/dL (ref 8–27)
CO2: 25 mmol/L (ref 20–29)
Calcium: 9.3 mg/dL (ref 8.7–10.3)
Chloride: 102 mmol/L (ref 96–106)
Creatinine, Ser: 1.21 mg/dL — ABNORMAL HIGH (ref 0.57–1.00)
GFR calc Af Amer: 53 mL/min/1.73 — ABNORMAL LOW
GFR calc non Af Amer: 46 mL/min/1.73 — ABNORMAL LOW
Glucose: 91 mg/dL (ref 65–99)
Potassium: 3.9 mmol/L (ref 3.5–5.2)
Sodium: 141 mmol/L (ref 134–144)

## 2018-07-04 ENCOUNTER — Telehealth: Payer: Self-pay | Admitting: Pediatric Intensive Care

## 2018-07-04 NOTE — Telephone Encounter (Signed)
Call from Early [place] with [facility]. She states that she continues to work with the client regarding keeping housing and her asylum case. Early states that client is able to get enough food. She states that client, however, has not been able to afford medication even though she has a [employer]. Client has been referred to [facility] for knee concerns.  Shann Medal RN BSN CNP [phone]

## 2018-07-06 ENCOUNTER — Ambulatory Visit (HOSPITAL_COMMUNITY): Admission: RE | Admit: 2018-07-06 | Payer: Self-pay | Source: Ambulatory Visit

## 2018-07-13 ENCOUNTER — Other Ambulatory Visit: Payer: Self-pay

## 2018-07-13 ENCOUNTER — Ambulatory Visit (HOSPITAL_COMMUNITY)
Admission: RE | Admit: 2018-07-13 | Discharge: 2018-07-13 | Disposition: A | Discharge status: Home / Self Care | Payer: Self-pay | Source: Ambulatory Visit | Attending: Family Medicine | Admitting: Family Medicine

## 2018-07-13 DIAGNOSIS — D696 Thrombocytopenia, unspecified: Secondary | ICD-10-CM | POA: Insufficient documentation

## 2018-07-16 ENCOUNTER — Telehealth: Payer: Self-pay | Admitting: *Deleted

## 2018-07-16 ENCOUNTER — Other Ambulatory Visit: Payer: Self-pay | Admitting: Family Medicine

## 2018-07-16 DIAGNOSIS — G8929 Other chronic pain: Secondary | ICD-10-CM

## 2018-07-16 MED ORDER — ACETAMINOPHEN-CODEINE #3 300-30 MG PO TABS: 1.0000 | ORAL_TABLET | Freq: Four times a day (QID) | ORAL | 3 refills | Status: DC | PRN

## 2018-07-16 NOTE — Telephone Encounter (Signed)
I will refill her tylenol #3. She is waiting to have knee replacement

## 2018-07-16 NOTE — Telephone Encounter (Signed)
Mrs. [name] [name] returned call for patient from Wyn [place]. DOB verified. Informed that the results of abdominal US are normal.   Would like to get medication for leg pain. Was given pain medication in April- Tylenol #3 and Voltaren Gel. She request refill on the Tylenol #3 and to be sent to [facility] [place].  Advised that OV maybe needed for evaluation. Please advise.   Updated phone number for patient: [phone]

## 2018-07-16 NOTE — Telephone Encounter (Signed)
Unable to reach, no answer on either phone number. Voicemail box is not set up.

## 2018-07-16 NOTE — Progress Notes (Signed)
Patient ID: [name] [name], female   DOB: 1950/02/17, 68 y.o.   MRN: [id number]   Patient requesting refill of Tylenol 3 to help with knee pain while she is waiting to have knee replacement.  Refill prescriptions will be sent to patient's pharmacy

## 2018-07-17 ENCOUNTER — Encounter (HOSPITAL_COMMUNITY): Payer: Self-pay | Admitting: Emergency Medicine

## 2018-07-17 ENCOUNTER — Other Ambulatory Visit: Payer: Self-pay

## 2018-07-17 ENCOUNTER — Emergency Department (HOSPITAL_COMMUNITY): Payer: Self-pay

## 2018-07-17 ENCOUNTER — Emergency Department (HOSPITAL_COMMUNITY)
Admission: EM | Admit: 2018-07-17 | Discharge: 2018-07-17 | Disposition: A | Discharge status: Home / Self Care | Payer: Self-pay | Attending: Emergency Medicine | Admitting: Emergency Medicine

## 2018-07-17 DIAGNOSIS — Z79899 Other long term (current) drug therapy: Secondary | ICD-10-CM | POA: Insufficient documentation

## 2018-07-17 DIAGNOSIS — M170 Bilateral primary osteoarthritis of knee: Secondary | ICD-10-CM | POA: Insufficient documentation

## 2018-07-17 DIAGNOSIS — I10 Essential (primary) hypertension: Secondary | ICD-10-CM | POA: Insufficient documentation

## 2018-07-17 LAB — URINALYSIS, ROUTINE W REFLEX MICROSCOPIC
Bilirubin Urine: NEGATIVE
Glucose, UA: NEGATIVE mg/dL
Ketones, ur: NEGATIVE mg/dL
Leukocytes,Ua: NEGATIVE
Nitrite: NEGATIVE
Protein, ur: 30 mg/dL — AB
Specific Gravity, Urine: 1.005 (ref 1.005–1.030)
pH: 7 (ref 5.0–8.0)

## 2018-07-17 LAB — COMPREHENSIVE METABOLIC PANEL
ALT: 14 U/L (ref 0–44)
AST: 15 U/L (ref 15–41)
Albumin: 3.5 g/dL (ref 3.5–5.0)
Alkaline Phosphatase: 49 U/L (ref 38–126)
Anion gap: 12 (ref 5–15)
BUN: 5 mg/dL — ABNORMAL LOW (ref 8–23)
CO2: 22 mmol/L (ref 22–32)
Calcium: 9.3 mg/dL (ref 8.9–10.3)
Chloride: 106 mmol/L (ref 98–111)
Creatinine, Ser: 0.99 mg/dL (ref 0.44–1.00)
GFR calc Af Amer: >60 mL/min (ref >60)
GFR calc non Af Amer: 58 mL/min — ABNORMAL LOW (ref >60)
Glucose, Bld: 130 mg/dL — ABNORMAL HIGH (ref 70–99)
Potassium: 3.5 mmol/L (ref 3.5–5.1)
Sodium: 140 mmol/L (ref 135–145)
Total Bilirubin: 1.4 mg/dL — ABNORMAL HIGH (ref 0.3–1.2)
Total Protein: 7.8 g/dL (ref 6.5–8.1)

## 2018-07-17 LAB — CBC WITH DIFFERENTIAL/PLATELET
Abs Immature Granulocytes: 0.06 10*3/uL (ref 0.00–0.07)
Basophils Absolute: 0 10*3/uL (ref 0.0–0.1)
Basophils Relative: 0 %
Eosinophils Absolute: 0.2 10*3/uL (ref 0.0–0.5)
Eosinophils Relative: 1 %
HCT: 38.2 % (ref 36.0–46.0)
Hemoglobin: 12.8 g/dL (ref 12.0–15.0)
Immature Granulocytes: 1 %
Lymphocytes Relative: 14 %
Lymphs Abs: 1.8 10*3/uL (ref 0.7–4.0)
MCH: 27.3 pg (ref 26.0–34.0)
MCHC: 33.5 g/dL (ref 30.0–36.0)
MCV: 81.4 fL (ref 80.0–100.0)
Monocytes Absolute: 1.9 10*3/uL — ABNORMAL HIGH (ref 0.1–1.0)
Monocytes Relative: 14 %
Neutro Abs: 9 10*3/uL — ABNORMAL HIGH (ref 1.7–7.7)
Neutrophils Relative %: 70 %
Platelets: 102 10*3/uL — ABNORMAL LOW (ref 150–400)
RBC: 4.69 MIL/uL (ref 3.87–5.11)
RDW: 15 % (ref 11.5–15.5)
WBC: 13 10*3/uL — ABNORMAL HIGH (ref 4.0–10.5)
nRBC: 0 % (ref 0.0–0.2)

## 2018-07-17 LAB — TROPONIN I
Troponin I: 0.04 ng/mL (ref <0.03)
Troponin I: 0.05 ng/mL (ref <0.03)

## 2018-07-17 LAB — URIC ACID: Uric Acid, Serum: 7.5 mg/dL — ABNORMAL HIGH (ref 2.5–7.1)

## 2018-07-17 MED ORDER — SODIUM CHLORIDE 0.9 % IV BOLUS
500.0000 mL | Freq: Once | INTRAVENOUS | Status: AC
  Administered 2018-07-17: 500 mL via INTRAVENOUS

## 2018-07-17 NOTE — Telephone Encounter (Signed)
Patients call returned.  Patient's care giver identified patient by name and date of birth.  Patient presently in the ED for weakness.  Patients caregiver advised that prescription had been sent to patients pharmacy.  Patient's caregiver acknowledged understanding of instructions.

## 2018-07-17 NOTE — ED Notes (Signed)
Date and time results received: 07/17/18 1444 (use smartphrase ".now" to insert current time)  Test: troponin  Critical Value: 0.04  Name of Provider Notified: Eulis Foster  Orders Received? Or Actions Taken?:

## 2018-07-17 NOTE — ED Provider Notes (Signed)
[facility] EM[facility]GENCY [facility] Provider Note   CSN: [id number] Arrival date & time: 07/17/18  1149    History   Chief Complaint Chief Complaint  Patient presents with  . Foot Pain    HPI [name] [name] is a 68 y.o. female.  Translation by [employer] language interpreter, via telemetry.     HPI   She presents for evaluation of bilateral foot and ankle pain, present for 2 or 3 days.  No known trauma.  She denies fever, chills, cough, shortness of breath, chest pain, weakness or dizziness.  She has not had any travel outside of [place], or the country, for at least a year.  She has no known sick contacts.  No similar problem in the past.  She states that she is taking her prescribed medicine.  She did not eat today.  She came by EMS for evaluation.  There are no other known modifying factors.  Past Medical History:  Diagnosis Date  . Hypertension     Patient Active Problem List   Diagnosis Date Noted  . Essential hypertension, benign 05/03/2018  . Hypertension 10/24/2017  . Knee osteoarthritis 10/24/2017    Past Surgical History:  Procedure Laterality Date  . NO PAST SURGERIES       OB History   No obstetric history on file.      Home Medications    Prior to Admission medications   Medication Sig Start Date End Date Taking? Authorizing Provider  acetaminophen (TYLENOL) 500 MG tablet Take 1,000 mg by mouth every 6 (six) hours as needed for fever.   Yes [provider]  lisinopril-hydrochlorothiazide (ZESTORETIC) 20-25 MG tablet Take 1 tablet by mouth daily. To lower blood pressure 05/25/18  Yes Fulp, Cammie, MD  acetaminophen-codeine (TYLENOL #3) 300-30 MG tablet Take 1 tablet by mouth every 6 (six) hours as needed for moderate pain. Patient not taking: Reported on 07/17/2018 07/16/18   Fulp, Cammie, MD  amLODipine (NORVASC) 5 MG tablet Take 1 tablet (5 mg total) by mouth daily. To lower blood pressure Patient not taking:  Reported on 07/17/2018 06/18/18   Fulp, Hewitt Shortsammie, MD  Calcium Carbonate (CALCIUM 600 PO) Take 1,200 mg by mouth 2 (two) times daily.     [provider]  diclofenac sodium (VOLTAREN) 1 % GEL Apply 4 g topically 4 (four) times daily. To the knees to reduce pain Patient not taking: Reported on 07/17/2018 05/25/18   Cain SaupeFulp, Cammie, MD  mupirocin ointment (BACTROBAN) 2 % Apply bid to wound Patient not taking: Reported on 07/17/2018 02/01/18   Anders SimmondsMcClung, Angela M, PA-C  triamcinolone cream (KENALOG) 0.1 % Apply to dry skin 1-2 times daily sparingly Patient not taking: Reported on 07/17/2018 03/21/18   Anders SimmondsMcClung, Angela M, PA-C    Family History Family History  Family history unknown: Yes    Social History Social History   Tobacco Use  . Smoking status: Never Smoker  . Smokeless tobacco: Never Used  Substance Use Topics  . Alcohol use: Never    Frequency: Never  . Drug use: Never     Allergies   Patient has no known allergies.   Review of Systems Review of Systems  All other systems reviewed and are negative.    Physical Exam Updated Vital Signs BP (!) 138/99   Pulse 82   Temp 99.6 F (37.6 C) (Oral)   Resp (!) 23   SpO2 96%   Physical Exam Vitals signs and nursing note reviewed.  Constitutional:  General: She is not in acute distress.    Appearance: She is well-developed. She is obese. She is not ill-appearing, toxic-appearing or diaphoretic.  HENT:     Head: Normocephalic and atraumatic.     Right Ear: External ear normal.     Left Ear: External ear normal.     Mouth/Throat:     Pharynx: No oropharyngeal exudate or posterior oropharyngeal erythema.  Eyes:     Conjunctiva/sclera: Conjunctivae normal.     Pupils: Pupils are equal, round, and reactive to light.  Neck:     Musculoskeletal: Normal range of motion and neck supple.     Trachea: Phonation normal.  Cardiovascular:     Rate and Rhythm: Normal rate and regular rhythm.     Heart sounds: Normal heart  sounds.  Pulmonary:     Effort: Pulmonary effort is normal. No respiratory distress.     Breath sounds: Normal breath sounds. No stridor. No rhonchi.  Chest:     Chest wall: No tenderness.  Abdominal:     Palpations: Abdomen is soft.     Tenderness: There is no abdominal tenderness.  Musculoskeletal: Normal range of motion.     Comments: Mild bilateral ankle and foot tenderness with swelling.  No isolated distinct arthralgia.  Feet are warm bilaterally.  Feet are sensate bilaterally.  Skin:    General: Skin is warm and dry.  Neurological:     Mental Status: She is alert and oriented to person, place, and time.     Cranial Nerves: No cranial nerve deficit.     Sensory: No sensory deficit.     Motor: No abnormal muscle tone.     Coordination: Coordination normal.  Psychiatric:        Mood and Affect: Mood normal.        Behavior: Behavior normal.        Thought Content: Thought content normal.        Judgment: Judgment normal.      ED Treatments / Results  Labs (all labs ordered are listed, but only abnormal results are displayed) Labs Reviewed  COMPREHENSIVE METABOLIC PANEL - Abnormal; Notable for the following components:      Result Value   Glucose, Bld 130 (*)    BUN 5 (*)    Total Bilirubin 1.4 (*)    GFR calc non Af Amer 58 (*)    All other components within normal limits  TROPONIN I - Abnormal; Notable for the following components:   Troponin I 0.04 (*)    All other components within normal limits  CBC WITH DIFFERENTIAL/PLATELET - Abnormal; Notable for the following components:   WBC 13.0 (*)    Platelets 102 (*)    Neutro Abs 9.0 (*)    Monocytes Absolute 1.9 (*)    All other components within normal limits  URINALYSIS, ROUTINE W REFLEX MICROSCOPIC - Abnormal; Notable for the following components:   Color, Urine STRAW (*)    Hgb urine dipstick SMALL (*)    Protein, ur 30 (*)    Bacteria, UA RARE (*)    All other components within normal limits  URIC ACID -  Abnormal; Notable for the following components:   Uric Acid, Serum 7.5 (*)    All other components within normal limits  TROPONIN I - Abnormal; Notable for the following components:   Troponin I 0.05 (*)    All other components within normal limits  POCT I-STAT EG7 - Abnormal; Notable for the following components:  pCO2, Ven 38.5 (*)    Sodium 131 (*)    Potassium >8.5 (*)    Calcium, Ion 0.89 (*)    HCT 47.0 (*)    Hemoglobin 16.0 (*)    All other components within normal limits    EKG EKG Interpretation  Date/Time:  Tuesday July 17 2018 12:03:08 EDT Ventricular Rate:  91 PR Interval:    QRS Duration: 95 QT Interval:  344 QTC Calculation: 424 R Axis:   67 Text Interpretation:  Sinus tachycardia Atrial premature complexes Short PR interval Anteroseptal infarct, old Minimal ST depression, diffuse leads since last tracing no significant change Confirmed by [name] [name], [employer] [phone]([id number]) on 07/17/2018 12:20:26 PM   Radiology Dg Ankle Complete Left  Result Date: 07/17/2018 CLINICAL DATA:  Pain EXAM: LEFT ANKLE COMPLETE - 3+ VIEW COMPARISON:  None. FINDINGS: There is no acute displaced fracture or dislocation. There is mild soft tissue swelling about the ankle. There are mild degenerative changes of the mortise joint. There is a plantar calcaneal spur that is small in size. IMPRESSION: No acute osseous abnormality. Electronically Signed   By: Katherine Mantlehristopher  Green M.D.   On: 07/17/2018 14:55   Dg Ankle Complete Right  Result Date: 07/17/2018 CLINICAL DATA:  Acute onset right ankle pain 07/15/2018. No known injury. EXAM: RIGHT ANKLE - COMPLETE 3+ VIEW COMPARISON:  None. FINDINGS: There is no evidence of fracture, dislocation, or joint effusion. There is no evidence of arthropathy or other focal bone abnormality. Soft tissues are unremarkable. IMPRESSION: Negative exam. Electronically Signed   By: Drusilla Kannerhomas  Dalessio M.D.   On: 07/17/2018 14:54   Dg Chest Port 1 View  Result Date: 07/17/2018  CLINICAL DATA:  Irregular heart rate.  Foot pain. EXAM: PORTABLE CHEST 1 VIEW COMPARISON:  None. FINDINGS: Double right heart border, consistent with left atrial enlargement. Atherosclerotic calcification of the aortic arch. Normal pulmonary vascularity. No focal consolidation, pleural effusion, or pneumothorax. No acute osseous abnormality. IMPRESSION: No active disease. Electronically Signed   By: Obie DredgeWilliam T Derry M.D.   On: 07/17/2018 13:40    Procedures Procedures (including critical care time)  Medications Ordered in ED Medications  sodium chloride 0.9 % bolus 500 mL (0 mLs Intravenous Stopped 07/17/18 1619)     Initial Impression / Assessment and Plan / ED Course  I have reviewed the triage vital signs and the nursing notes.  Pertinent labs & imaging results that were available during my care of the patient were reviewed by me and considered in my medical decision making (see chart for details).  Clinical Course as of Jul 17 1918  Tue Jul 17, 2018  1339 The nurse navigator was able to contact the patient's case manager, who knows her well.  She is recently been seen for right knee pain and told that she needs a knee replacement.  Patient is a refugee, and does not have any family in the area.   [EW]    Clinical Course User Index [EW] [name] [name], [name] Ell, MD        Patient Vitals for the past 24 hrs:  BP Temp Temp src Pulse Resp SpO2  07/17/18 1915 (!) 138/99 - - - (!) 23 -  07/17/18 1900 (!) 162/95 - - - (!) 24 -  07/17/18 1845 (!) 171/105 - - - (!) 23 -  07/17/18 1830 (!) 176/98 - - - (!) 24 -  07/17/18 1815 (!) 163/92 - - - (!) 22 -  07/17/18 1800 (!) 167/75 - - - (!) 23 -  07/17/18 1745 (!) 157/92 - - - (!) 23 -  07/17/18 1730 (!) 172/97 - - - (!) 28 -  07/17/18 1715 (!) 161/70 - - - (!) 23 -  07/17/18 1700 (!) 172/106 - - - (!) 24 -  07/17/18 1645 (!) 182/97 - - - 20 -  07/17/18 1630 (!) 183/113 - - - 19 -  07/17/18 1615 (!) 184/109 - - - (!) 25 -  07/17/18 1600 (!)  185/108 - - - (!) 23 -  07/17/18 1545 (!) 165/93 - - - 15 -  07/17/18 1530 (!) 155/81 - - - 20 -  07/17/18 1515 (!) 157/91 - - - (!) 26 -  07/17/18 1500 - - - - 18 -  07/17/18 1445 (!) 185/99 - - 82 (!) 21 96 %  07/17/18 1400 (!) 166/98 - - 84 (!) 23 95 %  07/17/18 1358 (!) 185/93 - - 77 (!) 26 95 %  07/17/18 1200 - 99.6 F (37.6 C) Oral - - 98 %  07/17/18 1156 (!) 194/99 - - 100 - 97 %    7:02 PM Reevaluation with update and discussion. After initial assessment and treatment, an updated evaluation reveals no change in clinical status she continues to deny chest pain.  Using audio language translator, for [place], I was able to communicate with the patient.  Findings discussed with the patient and all questions were answered. Daleen Bo   Medical Decision Making: Nonspecific foot pain, with significant degenerative changes both knee joints.  Her PCP has called in prescriptions for Tylenol 3, and amlodipine to treat hypertension.  Patient is not having chest pain.  Mild troponin elevation without change over 3 hours, doubt ACS, PE or pneumonia.  No indication for further ED treatment or intervention at this time.  CRITICAL CARE-no Performed by: Daleen Bo  Nursing Notes Reviewed/ Care Coordinated Applicable Imaging Reviewed Interpretation of Laboratory Data incorporated into ED treatment  The patient appears reasonably screened and/or stabilized for discharge and I doubt any other medical condition or other [facility] requiring further screening, evaluation, or treatment in the ED at this time prior to discharge.  Plan: Home Medications-continue prescribed medications; Home Treatments-symptomatic treatment including heat therapy for knees; return here if the recommended treatment, does not improve the symptoms; Recommended follow up-CP, PRN   Final Clinical Impressions(s) / ED Diagnoses   Final diagnoses:  Osteoarthritis of both knees, unspecified osteoarthritis type    ED Discharge  Orders    None       Daleen Bo, MD 07/17/18 1921

## 2018-07-17 NOTE — Discharge Instructions (Addendum)
Your doctor to schedule further care and treatment.  We may be able to refer you to physical therapy for assistance with your knee pain.  Consider getting a walker to use to help you walk.  They will help you to use heat on your knees, in the form of a heating pad several times each day.  Your doctor sent prescriptions to your pharmacy to take for pain, and control of your blood pressure.  Please get those prescriptions and start taking them as directed.

## 2018-07-17 NOTE — ED Notes (Signed)
Notified Dr. Eulis Foster of pt's critical VBG results

## 2018-07-17 NOTE — ED Notes (Signed)
This RN navigator contacted patient's caseworker Mrs. Tamala Julian for more information about the reason for today's visit. Mrs. Tamala Julian relayed that patient had been having right knee pain for a while. Has seen an orthopedist that recommended a knee replacement. States patient may have undiagnosed dementia based on her interactions with the patient. The case worker states the patient prior to today was independent in ADLs, able to ambulate with a cane. Today unable to get out of bed due to pain and soiled herself. Caseworker states patient has not been having any other symptoms of illness, such as fever, vomiting, SOB.

## 2018-07-17 NOTE — ED Notes (Signed)
Pt discharged from ED; instructions provided  given; Pt encouraged to return to ED if symptoms worsen and to f/u with PCP; Pt verbalized understanding of all instructions 

## 2018-07-17 NOTE — ED Triage Notes (Signed)
Pt arrives from home via EMS w/co bilateral foot pain. Hurts to touch and hot to touch. EMS states irregular heart rate. HTN. Pt can hardly bare weight on feet. Pts has been taking tylenol since Sunday.

## 2018-07-19 LAB — POCT I-STAT EG7
Bicarbonate: 24.4 mmol/L (ref 20.0–28.0)
Calcium, Ion: 0.89 mmol/L — CL (ref 1.15–1.40)
HCT: 47 % — ABNORMAL HIGH (ref 36.0–46.0)
Hemoglobin: 16 g/dL — ABNORMAL HIGH (ref 12.0–15.0)
O2 Saturation: 63 %
Potassium: >8.5 mmol/L (ref 3.5–5.1)
Sodium: 131 mmol/L — ABNORMAL LOW (ref 135–145)
TCO2: 26 mmol/L (ref 22–32)
pCO2, Ven: 38.5 mmHg — ABNORMAL LOW (ref 44.0–60.0)
pH, Ven: 7.411 (ref 7.250–7.430)
pO2, Ven: 32 mmHg (ref 32.0–45.0)

## 2018-07-26 ENCOUNTER — Emergency Department (HOSPITAL_BASED_OUTPATIENT_CLINIC_OR_DEPARTMENT_OTHER): Payer: Self-pay

## 2018-07-26 ENCOUNTER — Emergency Department (HOSPITAL_COMMUNITY)
Admission: EM | Admit: 2018-07-26 | Discharge: 2018-07-26 | Disposition: A | Discharge status: Home / Self Care | Payer: Self-pay | Attending: Emergency Medicine | Admitting: Emergency Medicine

## 2018-07-26 ENCOUNTER — Inpatient Hospital Stay: Payer: Self-pay | Admitting: Family Medicine

## 2018-07-26 ENCOUNTER — Encounter (HOSPITAL_COMMUNITY): Payer: Self-pay | Admitting: Emergency Medicine

## 2018-07-26 ENCOUNTER — Other Ambulatory Visit: Payer: Self-pay

## 2018-07-26 DIAGNOSIS — Z79899 Other long term (current) drug therapy: Secondary | ICD-10-CM | POA: Insufficient documentation

## 2018-07-26 DIAGNOSIS — G8929 Other chronic pain: Secondary | ICD-10-CM

## 2018-07-26 DIAGNOSIS — M79671 Pain in right foot: Secondary | ICD-10-CM

## 2018-07-26 DIAGNOSIS — I739 Peripheral vascular disease, unspecified: Secondary | ICD-10-CM

## 2018-07-26 DIAGNOSIS — M79672 Pain in left foot: Secondary | ICD-10-CM | POA: Insufficient documentation

## 2018-07-26 DIAGNOSIS — M25561 Pain in right knee: Secondary | ICD-10-CM

## 2018-07-26 DIAGNOSIS — I10 Essential (primary) hypertension: Secondary | ICD-10-CM | POA: Insufficient documentation

## 2018-07-26 DIAGNOSIS — R52 Pain, unspecified: Secondary | ICD-10-CM

## 2018-07-26 LAB — CBC WITH DIFFERENTIAL/PLATELET
Abs Immature Granulocytes: 0.05 10*3/uL (ref 0.00–0.07)
Basophils Absolute: 0 10*3/uL (ref 0.0–0.1)
Basophils Relative: 0 %
Eosinophils Absolute: 0 10*3/uL (ref 0.0–0.5)
Eosinophils Relative: 0 %
HCT: 35.3 % — ABNORMAL LOW (ref 36.0–46.0)
Hemoglobin: 11.3 g/dL — ABNORMAL LOW (ref 12.0–15.0)
Immature Granulocytes: 1 %
Lymphocytes Relative: 27 %
Lymphs Abs: 2.1 10*3/uL (ref 0.7–4.0)
MCH: 27 pg (ref 26.0–34.0)
MCHC: 32 g/dL (ref 30.0–36.0)
MCV: 84.2 fL (ref 80.0–100.0)
Monocytes Absolute: 1 10*3/uL (ref 0.1–1.0)
Monocytes Relative: 12 %
Neutro Abs: 4.7 10*3/uL (ref 1.7–7.7)
Neutrophils Relative %: 60 %
Platelets: 228 10*3/uL (ref 150–400)
RBC: 4.19 MIL/uL (ref 3.87–5.11)
RDW: 15.3 % (ref 11.5–15.5)
WBC: 7.9 10*3/uL (ref 4.0–10.5)
nRBC: 0 % (ref 0.0–0.2)

## 2018-07-26 LAB — COMPREHENSIVE METABOLIC PANEL
ALT: 21 U/L (ref 0–44)
AST: 15 U/L (ref 15–41)
Albumin: 3 g/dL — ABNORMAL LOW (ref 3.5–5.0)
Alkaline Phosphatase: 47 U/L (ref 38–126)
Anion gap: 11 (ref 5–15)
BUN: 13 mg/dL (ref 8–23)
CO2: 26 mmol/L (ref 22–32)
Calcium: 9.1 mg/dL (ref 8.9–10.3)
Chloride: 103 mmol/L (ref 98–111)
Creatinine, Ser: 1.15 mg/dL — ABNORMAL HIGH (ref 0.44–1.00)
GFR calc Af Amer: 56 mL/min — ABNORMAL LOW (ref >60)
GFR calc non Af Amer: 49 mL/min — ABNORMAL LOW (ref >60)
Glucose, Bld: 108 mg/dL — ABNORMAL HIGH (ref 70–99)
Potassium: 3.9 mmol/L (ref 3.5–5.1)
Sodium: 140 mmol/L (ref 135–145)
Total Bilirubin: 0.5 mg/dL (ref 0.3–1.2)
Total Protein: 7.5 g/dL (ref 6.5–8.1)

## 2018-07-26 MED ORDER — GABAPENTIN 100 MG PO CAPS: 100.0000 mg | ORAL_CAPSULE | Freq: Three times a day (TID) | ORAL | 0 refills | Status: DC

## 2018-07-26 MED ORDER — GABAPENTIN 100 MG PO CAPS
100.0000 mg | ORAL_CAPSULE | Freq: Once | ORAL | Status: AC
  Administered 2018-07-26: 11:00:00 100 mg via ORAL
  Filled 2018-07-26: qty 1

## 2018-07-26 MED ORDER — DICLOFENAC SODIUM 1 % TD GEL: 4.0000 g | Freq: Four times a day (QID) | TRANSDERMAL | 0 refills | Status: DC

## 2018-07-26 MED ORDER — KETOROLAC TROMETHAMINE 30 MG/ML IJ SOLN
30.0000 mg | Freq: Once | INTRAMUSCULAR | Status: AC
  Administered 2018-07-26: 18:00:00 30 mg via INTRAMUSCULAR
  Filled 2018-07-26: qty 1

## 2018-07-26 NOTE — ED Notes (Signed)
RN and EMT attempted to ambulate pt and pt could not bear weight on her feet to stand. Pt put on a bed pan to urinate.

## 2018-07-26 NOTE — ED Notes (Signed)
We have paged vascular twice and have not heard anything from them

## 2018-07-26 NOTE — ED Triage Notes (Signed)
Pt has bilateral foot and sole of feet pain and burning. Pt states this started on 6/14 she was seen for same on 6/16. Pt states now she is unable to walk or use crutches due to pain.

## 2018-07-26 NOTE — Progress Notes (Signed)
ABI's have been completed. Preliminary results can be found in CV Proc through chart review.  Results were given to Dr. Melina Copa.  07/26/18 4:03 PM Carlos Levering RVT

## 2018-07-26 NOTE — ED Provider Notes (Signed)
4:04 PM Received sign out at beginning of shift.  Please refer to Dr. Madilyn Hookees full HPI. ABI was obtained indicating resting right ABI is within normal range without any significant right lower extremity arterial disease.  Resting left ankle-brachial index indicates mild left lower extremity arterial disease to left toe brachial index is abnormal as well as the right toe brachial index is abnormal.  At this time, patient would like to ambulate to use the bathroom.  I made her aware of her test finding.  She mention her symptom is improving.  I would like patient to follow-up with vascular surgery for outpatient evaluation.  She is to take gabapentin as prescribed for pain.  Return precaution discussed.  Communication was via audio language interpreter  5:33 PM I suspect pain is likely 2/2 peripheral neuropathy and less likely claudication when considering her sxs is affecting both legs but ABI is normal on the R leg, and minimal changes in the left leg.  However, I do recommend pt to f/u with vascular surgeon and with PCP for further care.   When attempt to ambulate, pt unable to bear weight and can't ambulate. She has tenderness to dorsum of L foot.  However, 5/5 strength to BLE, no back pain, intact patella DTR.  Doubt caudal equina causing pain.  Pain medication given.  Anticipate discharge.   6:58 PM Pt given pain medication and will also prescribe voltaren gel for foot pain.  Able to ambulate, stable for discharge  BP (!) 175/103   Pulse 63   Temp 98.5 F (36.9 C)   Resp 16   SpO2 95%   Results for orders placed or performed during the hospital encounter of 07/26/18  Comprehensive metabolic panel  Result Value Ref Range   Sodium 140 135 - 145 mmol/L   Potassium 3.9 3.5 - 5.1 mmol/L   Chloride 103 98 - 111 mmol/L   CO2 26 22 - 32 mmol/L   Glucose, Bld 108 (H) 70 - 99 mg/dL   BUN 13 8 - 23 mg/dL   Creatinine, Ser 4.091.15 (H) 0.44 - 1.00 mg/dL   Calcium 9.1 8.9 - 81.110.3 mg/dL   Total  Protein 7.5 6.5 - 8.1 g/dL   Albumin 3.0 (L) 3.5 - 5.0 g/dL   AST 15 15 - 41 U/L   ALT 21 0 - 44 U/L   Alkaline Phosphatase 47 38 - 126 U/L   Total Bilirubin 0.5 0.3 - 1.2 mg/dL   GFR calc non Af Amer 49 (L) >60 mL/min   GFR calc Af Amer 56 (L) >60 mL/min   Anion gap 11 5 - 15  CBC with Differential  Result Value Ref Range   WBC 7.9 4.0 - 10.5 K/uL   RBC 4.19 3.87 - 5.11 MIL/uL   Hemoglobin 11.3 (L) 12.0 - 15.0 g/dL   HCT 91.435.3 (L) 78.236.0 - 95.646.0 %   MCV 84.2 80.0 - 100.0 fL   MCH 27.0 26.0 - 34.0 pg   MCHC 32.0 30.0 - 36.0 g/dL   RDW 21.315.3 08.611.5 - 57.815.5 %   Platelets 228 150 - 400 K/uL   nRBC 0.0 0.0 - 0.2 %   Neutrophils Relative % 60 %   Neutro Abs 4.7 1.7 - 7.7 K/uL   Lymphocytes Relative 27 %   Lymphs Abs 2.1 0.7 - 4.0 K/uL   Monocytes Relative 12 %   Monocytes Absolute 1.0 0.1 - 1.0 K/uL   Eosinophils Relative 0 %   Eosinophils Absolute 0.0 0.0 -  0.5 K/uL   Basophils Relative 0 %   Basophils Absolute 0.0 0.0 - 0.1 K/uL   Immature Granulocytes 1 %   Abs Immature Granulocytes 0.05 0.00 - 0.07 K/uL   Dg Ankle Complete Left  Result Date: 07/17/2018 CLINICAL DATA:  Pain EXAM: LEFT ANKLE COMPLETE - 3+ VIEW COMPARISON:  None. FINDINGS: There is no acute displaced fracture or dislocation. There is mild soft tissue swelling about the ankle. There are mild degenerative changes of the mortise joint. There is a plantar calcaneal spur that is small in size. IMPRESSION: No acute osseous abnormality. Electronically Signed   By: Constance Holster M.D.   On: 07/17/2018 14:55   Dg Ankle Complete Right  Result Date: 07/17/2018 CLINICAL DATA:  Acute onset right ankle pain 07/15/2018. No known injury. EXAM: RIGHT ANKLE - COMPLETE 3+ VIEW COMPARISON:  None. FINDINGS: There is no evidence of fracture, dislocation, or joint effusion. There is no evidence of arthropathy or other focal bone abnormality. Soft tissues are unremarkable. IMPRESSION: Negative exam. Electronically Signed   By: Inge Rise M.D.   On: 07/17/2018 14:54   US Abdomen Complete  Result Date: 07/13/2018 CLINICAL DATA:  Thrombocytopenia.  Evaluate for splenomegaly. EXAM: ABDOMEN ULTRASOUND COMPLETE COMPARISON:  None. FINDINGS: Gallbladder: No gallstones or wall thickening visualized. No sonographic Murphy sign noted by sonographer. Common bile duct: Diameter: 7-8 mm, at the upper limits of normal for the patient's age. Liver: 1.3 cm simple cyst in the left hepatic lobe. Within normal limits in parenchymal echogenicity. Portal vein is patent on color Doppler imaging with normal direction of blood flow towards the liver. IVC: No abnormality visualized. Pancreas: Visualized portion unremarkable. Spleen: Size and appearance within normal limits. Right Kidney: Length: 9.8 cm. Echogenicity within normal limits. No mass or hydronephrosis visualized. Left Kidney: Length: 9.4 cm. Echogenicity within normal limits. No mass or hydronephrosis visualized. Abdominal aorta: No aneurysm visualized. Other findings: None. IMPRESSION: 1. Normal spleen.  No acute abnormality. Electronically Signed   By: Titus Dubin M.D.   On: 07/13/2018 15:58   Dg Chest Port 1 View  Result Date: 07/17/2018 CLINICAL DATA:  Irregular heart rate.  Foot pain. EXAM: PORTABLE CHEST 1 VIEW COMPARISON:  None. FINDINGS: Double right heart border, consistent with left atrial enlargement. Atherosclerotic calcification of the aortic arch. Normal pulmonary vascularity. No focal consolidation, pleural effusion, or pneumothorax. No acute osseous abnormality. IMPRESSION: No active disease. Electronically Signed   By: Titus Dubin M.D.   On: 07/17/2018 13:40   Vas [place] Burnard Bunting With/wo Tbi  Result Date: 07/26/2018 LOWER EXTREMITY DOPPLER STUDY Indications: Rest pain. High Risk Factors: Hypertension.  Comparison Study: No prior studies. Performing Technologist: Carlos Levering Rvt  Examination Guidelines: A complete evaluation includes at minimum, Doppler waveform signals and  systolic blood pressure reading at the level of bilateral brachial, anterior tibial, and posterior tibial arteries, when vessel segments are accessible. Bilateral testing is considered an integral part of a complete examination. Photoelectric Plethysmograph (PPG) waveforms and toe systolic pressure readings are included as required and additional duplex testing as needed. Limited examinations for reoccurring indications may be performed as noted.  ABI Findings: +---------+------------------+-----+---------+--------+ Right    Rt Pressure (mmHg)IndexWaveform Comment  +---------+------------------+-----+---------+--------+ Brachial 182                    triphasic         +---------+------------------+-----+---------+--------+ PTA      186  0.98 triphasic         +---------+------------------+-----+---------+--------+ DP       185               0.97 triphasic         +---------+------------------+-----+---------+--------+ Great Toe116               0.61                   +---------+------------------+-----+---------+--------+ +---------+------------------+-----+---------+-------+ Left     Lt Pressure (mmHg)IndexWaveform Comment +---------+------------------+-----+---------+-------+ Brachial 190                    triphasic        +---------+------------------+-----+---------+-------+ PTA      132               0.69 biphasic         +---------+------------------+-----+---------+-------+ DP       157               0.83 biphasic         +---------+------------------+-----+---------+-------+ Great Toe75                0.39                  +---------+------------------+-----+---------+-------+ +-------+-----------+-----------+------------+------------+ ABI/TBIToday's ABIToday's TBIPrevious ABIPrevious TBI +-------+-----------+-----------+------------+------------+ Right  0.98       0.61                                 +-------+-----------+-----------+------------+------------+ Left   0.83       0.39                                +-------+-----------+-----------+------------+------------+  Summary: Right: Resting right ankle-brachial index is within normal range. No evidence of significant right lower extremity arterial disease. The right toe-brachial index is abnormal. 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 The left toe-brachial index is abnormal.  *See table(s) above for measurements and observations.     Preliminary       Fayrene Helperran, Tonie Elsey, PA-C 07/26/18 1858    Terrilee FilesButler, Michael C, MD 07/27/18 [id number]

## 2018-07-26 NOTE — ED Notes (Addendum)
Pt given dc instructions and verbalizes understanding. Pt friend is picking her up from the ED.

## 2018-07-26 NOTE — Discharge Instructions (Signed)
Your foot pain may be due to nerve irritation in the case of peripheral neuropathy.  Pain may also due to decreased blood flow to your leg, especially the left leg.  Follow up with your doctor and with vascular surgeon for further care.  Take gabapentin as prescribed.  Return if you have any concerns.

## 2018-07-26 NOTE — ED Notes (Signed)
Vascular says they have received our page and will be down soon to do the vascular US.

## 2018-07-26 NOTE — ED Notes (Signed)
Nurse navigator spoke with patient using the language interpretor discussed delay and plan of care.

## 2018-07-26 NOTE — ED Provider Notes (Signed)
[facility] EMERGENCY DEPARTMENT Provider Note   CSN: [id number] Arrival date & time: 07/26/18  1007    History   Chief Complaint Chief Complaint  Patient presents with  . Foot Pain    HPI [name] [name] is a 68 y.o. female.     The history is provided by the patient and medical records. A language interpreter was used.   [name] [name] is a 68 y.o. female who presents to the Emergency Department complaining of bilateral foot pain.  Level V caveat due to language barrier.  She complains of acute on chronic bilateral foot pain.  She has experienced BLE pain for the last twenty years that has gradually worsened.  Over the last week she has been unable to walk due to burning pain on the bilateral soles.  She denies associated CP/AP/SOB/numbness, weakness.  She has been taking a "pain reliever" at home and using crutches with no relief.  She is having difficulty with walking and standing secondary to pain.  Past Medical History:  Diagnosis Date  . Hypertension     Patient Active Problem List   Diagnosis Date Noted  . Essential hypertension, benign 05/03/2018  . Hypertension 10/24/2017  . Knee osteoarthritis 10/24/2017    Past Surgical History:  Procedure Laterality Date  . NO PAST SURGERIES       OB History   No obstetric history on file.      Home Medications    Prior to Admission medications   Medication Sig Start Date End Date Taking? Authorizing Provider  acetaminophen (TYLENOL) 500 MG tablet Take 1,000 mg by mouth every 6 (six) hours as needed for fever.    [provider]  acetaminophen-codeine (TYLENOL #3) 300-30 MG tablet Take 1 tablet by mouth every 6 (six) hours as needed for moderate pain. Patient not taking: Reported on 07/17/2018 07/16/18   Fulp, Cammie, MD  amLODipine (NORVASC) 5 MG tablet Take 1 tablet (5 mg total) by mouth daily. To lower blood pressure Patient not taking: Reported on 07/17/2018 06/18/18   Fulp,  Ander Gaster, MD  Calcium Carbonate (CALCIUM 600 PO) Take 1,200 mg by mouth 2 (two) times daily.     [provider]  diclofenac sodium (VOLTAREN) 1 % GEL Apply 4 g topically 4 (four) times daily. To the knees to reduce pain Patient not taking: Reported on 07/17/2018 05/25/18   Fulp, Ander Gaster, MD  gabapentin (NEURONTIN) 100 MG capsule Take 1 capsule (100 mg total) by mouth 3 (three) times daily. 07/26/18   Quintella Reichert, MD  lisinopril-hydrochlorothiazide (ZESTORETIC) 20-25 MG tablet Take 1 tablet by mouth daily. To lower blood pressure 05/25/18   Fulp, Cammie, MD  mupirocin ointment (BACTROBAN) 2 % Apply bid to wound Patient not taking: Reported on 07/17/2018 02/01/18   Argentina Donovan, PA-C  triamcinolone cream (KENALOG) 0.1 % Apply to dry skin 1-2 times daily sparingly Patient not taking: Reported on 07/17/2018 03/21/18   Argentina Donovan, PA-C    Family History Family History  Family history unknown: Yes    Social History Social History   Tobacco Use  . Smoking status: Never Smoker  . Smokeless tobacco: Never Used  Substance Use Topics  . Alcohol use: Never    Frequency: Never  . Drug use: Never     Allergies   Patient has no known allergies.   Review of Systems Review of Systems  All other systems reviewed and are negative.    Physical Exam Updated Vital Signs BP Marland Kitchen)  172/99   Pulse 63   Temp 98.5 F (36.9 C)   Resp 17   SpO2 95%   Physical Exam Vitals signs and nursing note reviewed.  Constitutional:      Appearance: She is well-developed.  HENT:     Head: Normocephalic and atraumatic.  Cardiovascular:     Rate and Rhythm: Normal rate and regular rhythm.     Heart sounds: No murmur.  Pulmonary:     Effort: Pulmonary effort is normal. No respiratory distress.     Breath sounds: Normal breath sounds.  Abdominal:     Palpations: Abdomen is soft.     Tenderness: There is no abdominal tenderness. There is no guarding or rebound.  Musculoskeletal:         General: No tenderness.     Comments: 2+ femoral pulses bilaterally, 1+ DP pulses bilaterally.  Feet are warm with trace edema.  There is tenderness to palpation on bilateral soles without any massess.  Flexion/extension intact at the ankle, wiggles toes.    Skin:    General: Skin is warm and dry.  Neurological:     Mental Status: She is alert and oriented to person, place, and time.     Comments: 5/5 strength in all four extremities with sensation to light touch intact in all four extremities.    Psychiatric:        Behavior: Behavior normal.      ED Treatments / Results  Labs (all labs ordered are listed, but only abnormal results are displayed) Labs Reviewed  COMPREHENSIVE METABOLIC PANEL - Abnormal; Notable for the following components:      Result Value   Glucose, Bld 108 (*)    Creatinine, Ser 1.15 (*)    Albumin 3.0 (*)    GFR calc non Af Amer 49 (*)    GFR calc Af Amer 56 (*)    All other components within normal limits  CBC WITH DIFFERENTIAL/PLATELET - Abnormal; Notable for the following components:   Hemoglobin 11.3 (*)    HCT 35.3 (*)    All other components within normal limits    EKG None  Radiology No results found.  Procedures Procedures (including critical care time)  Medications Ordered in ED Medications  gabapentin (NEURONTIN) capsule 100 mg (100 mg Oral Given 07/26/18 1048)     Initial Impression / Assessment and Plan / ED Course  I have reviewed the triage vital signs and the nursing notes.  Pertinent labs & imaging results that were available during my care of the patient were reviewed by me and considered in my medical decision making (see chart for details).       patient here for evaluation of progressive pain to bilateral feet.  She is NVI on examination, but pedal pulses are slightly diminished compared to her femorals.  No evidence of acute infectious process.  Plan to start gabapentin for neuropathic pain.  Pt care transferred pending  ABI.  Anticipate d/c home with PCP follow up, return precautions if ABI without significant abnormality.    Final Clinical Impressions(s) / ED Diagnoses   Final diagnoses:  Bilateral foot pain    ED Discharge Orders         Ordered    gabapentin (NEURONTIN) 100 MG capsule  3 times daily     07/26/18 1506           Tilden Fossaees, Daylah Sayavong, MD 07/26/18 [phone]

## 2018-07-27 ENCOUNTER — Other Ambulatory Visit: Payer: Self-pay | Admitting: Family Medicine

## 2018-07-27 DIAGNOSIS — M79672 Pain in left foot: Secondary | ICD-10-CM

## 2018-07-27 DIAGNOSIS — I739 Peripheral vascular disease, unspecified: Secondary | ICD-10-CM

## 2018-07-27 NOTE — Progress Notes (Signed)
Patient ID: [name] [name], female   DOB: 01/09/1951, 68 y.o.   MRN: [id number]   Patient with ED visit on 07/26/2018 for foot pain and had abnormal ABI's. See ED note. Needs referral to vascular surgery in follow-up

## 2018-08-08 ENCOUNTER — Other Ambulatory Visit: Payer: Self-pay

## 2018-08-08 ENCOUNTER — Encounter: Payer: Self-pay | Admitting: Family Medicine

## 2018-08-08 ENCOUNTER — Ambulatory Visit: Payer: Self-pay | Attending: Family Medicine | Admitting: Family Medicine

## 2018-08-08 VITALS — BP 128/91 | HR 63 | Temp 98.0°F | Ht 64.0 in

## 2018-08-08 DIAGNOSIS — M545 Low back pain, unspecified: Secondary | ICD-10-CM

## 2018-08-08 DIAGNOSIS — M25561 Pain in right knee: Secondary | ICD-10-CM

## 2018-08-08 DIAGNOSIS — M79671 Pain in right foot: Secondary | ICD-10-CM

## 2018-08-08 DIAGNOSIS — M25562 Pain in left knee: Secondary | ICD-10-CM

## 2018-08-08 DIAGNOSIS — M5440 Lumbago with sciatica, unspecified side: Secondary | ICD-10-CM

## 2018-08-08 DIAGNOSIS — M79672 Pain in left foot: Secondary | ICD-10-CM

## 2018-08-08 DIAGNOSIS — I739 Peripheral vascular disease, unspecified: Secondary | ICD-10-CM

## 2018-08-08 DIAGNOSIS — I10 Essential (primary) hypertension: Secondary | ICD-10-CM

## 2018-08-08 DIAGNOSIS — Z09 Encounter for follow-up examination after completed treatment for conditions other than malignant neoplasm: Secondary | ICD-10-CM

## 2018-08-08 DIAGNOSIS — G8929 Other chronic pain: Secondary | ICD-10-CM

## 2018-08-08 MED ORDER — AMLODIPINE BESYLATE 5 MG PO TABS: 5.0000 mg | ORAL_TABLET | Freq: Every day | ORAL | 3 refills | Status: DC

## 2018-08-08 MED ORDER — PREDNISONE 20 MG PO TABS: ORAL_TABLET | ORAL | 0 refills | Status: DC

## 2018-08-08 MED ORDER — ACETAMINOPHEN-CODEINE #3 300-30 MG PO TABS: 1.0000 | ORAL_TABLET | Freq: Four times a day (QID) | ORAL | 3 refills | Status: DC | PRN

## 2018-08-08 MED ORDER — GABAPENTIN 100 MG PO CAPS: 100.0000 mg | ORAL_CAPSULE | Freq: Three times a day (TID) | ORAL | 6 refills | Status: DC

## 2018-08-08 NOTE — Progress Notes (Signed)
Established Patient Office Visit  Subjective:  Patient ID: [name] [name], female    DOB: [birth year]  Age: 68 y.o. MRN: [id number]  Due to a language barrier, patient is accompanied by her case manager at today's visit (case manager speaks [place] which is patient's second language, patient's primary language is Lingala)  CC:  Chief Complaint  Patient presents with  . Hearing Problem  . Leg Pain  . Medication Refill  . Hospitalization Follow-up    HPI [name] [name] presents for follow-up from ED visit on 07/26/2018 for foot pain and she complains of bilateral pain and numbness in her feet and she has trouble standing due to the pain. She also has pain in her lower back that seems to go down to her legs to her feet that is sharp and burning. She also continues to have knee pain as well. Pain ranges from an 8-10 on a 0-10 scale. She is currently out of pain medication. The gabapentin she was previously prescribed helped as well as the tylenol #3.  (Per her ED notes, patient's foot pain was also thought to also be related to peripheral vascular disease as she had abnormal ABI's.).       She does continue to take her blood pressure medication and denies headaches or dizziness related to her blood pressure.  She reports that within 2 hours of taking her lisinopril-hydrochlorothiazide she has a sensation of chest discomfort and sometimes sensation of abnormal sensation in her chest/palpitations.  Past Medical History:  Diagnosis Date  . Hypertension     Past Surgical History:  Procedure Laterality Date  . NO PAST SURGERIES      Family History  Family history unknown: Yes    Social History   Tobacco Use  . Smoking status: Never Smoker  . Smokeless tobacco: Never Used  Substance Use Topics  . Alcohol use: Never    Frequency: Never  . Drug use: Never    Outpatient Medications Prior to Visit  Medication Sig Dispense Refill  . acetaminophen (TYLENOL) 500 MG  tablet Take 1,000 mg by mouth every 6 (six) hours as needed for fever.    Marland Kitchen. acetaminophen-codeine (TYLENOL #3) 300-30 MG tablet Take 1 tablet by mouth every 6 (six) hours as needed for moderate pain. (Patient not taking: Reported on 07/17/2018) 60 tablet 3  . amLODipine (NORVASC) 5 MG tablet Take 1 tablet (5 mg total) by mouth daily. To lower blood pressure (Patient not taking: Reported on 07/17/2018) 30 tablet 3  . Calcium Carbonate (CALCIUM 600 PO) Take 1,200 mg by mouth 2 (two) times daily.     . diclofenac sodium (VOLTAREN) 1 % GEL Apply 4 g topically 4 (four) times daily. Apply to feet 4 times daily PRN for pain. 100 g 0  . gabapentin (NEURONTIN) 100 MG capsule Take 1 capsule (100 mg total) by mouth 3 (three) times daily. 30 capsule 0  . lisinopril-hydrochlorothiazide (ZESTORETIC) 20-25 MG tablet Take 1 tablet by mouth daily. To lower blood pressure 30 tablet 6  . mupirocin ointment (BACTROBAN) 2 % Apply bid to wound (Patient not taking: Reported on 07/17/2018) 22 g 0  . triamcinolone cream (KENALOG) 0.1 % Apply to dry skin 1-2 times daily sparingly (Patient not taking: Reported on 07/17/2018) 30 g 2   No facility-administered medications prior to visit.     No Known Allergies  ROS Review of Systems  Constitutional: Positive for fatigue. Negative for chills and fever.  Eyes: Negative for photophobia and visual  disturbance.  Respiratory: Negative for cough and shortness of breath.   Cardiovascular: Positive for chest pain (With use of lisinopril hydrochlorothiazide) and palpitations.  Gastrointestinal: Negative for abdominal pain, constipation, diarrhea and nausea.  Endocrine: Negative for cold intolerance, heat intolerance, polydipsia, polyphagia and polyuria.  Genitourinary: Negative for dysuria and frequency.  Musculoskeletal: Positive for arthralgias, back pain, gait problem and joint swelling.  Neurological: Positive for weakness and numbness. Negative for dizziness and headaches.    Hematological: Negative for adenopathy.      Objective:    Physical Exam  Constitutional: She is oriented to person, place, and time. She appears well-developed and well-nourished.  Well-nourished well-developed older female in no acute distress but who appears fatigued/appears not to feel well and who is sitting in a wheelchair and is accompanied by her caseworker who acts as interpreter  Cardiovascular: Normal rate and regular rhythm.  Pulmonary/Chest: Effort normal and breath sounds normal.  Abdominal: There is no abdominal tenderness. There is no rebound and no guarding.  Musculoskeletal:        General: Tenderness and edema (some edema of the right knee) present.     Comments: Lumbosacral tenderness to palpation and bilateral positive seated leg raise.  Patient with some tenderness to palpation over the MCP joint of the left foot.  MTP joint of the right foot is prominent and slightly swollen but without tenderness.  No increased warmth.  Nonpalpable distal pulses left foot, decreased peripheral pulses in the right; joint line tenderness of the right knee  Neurological: She is alert and oriented to person, place, and time.  Psychiatric: She has a normal mood and affect. Her behavior is normal.  Nursing note and vitals reviewed.    Wt Readings from Last 3 Encounters:  05/25/18 190 lb 9.6 oz (86.5 kg)  03/21/18 183 lb (83 kg)  03/13/18 185 lb 1.6 oz (84 kg)     Health Maintenance Due  Topic Date Due  . TETANUS/TDAP  01/26/1968  . MAMMOGRAM  01/26/1999  . COLONOSCOPY  01/26/1999  . DEXA SCAN  01/25/2014  . PNA vac Low Risk Adult (1 of 2 - PCV13) 01/25/2014    There are no preventive care reminders to display for this patient.  No results found for: TSH Lab Results  Component Value Date   WBC 7.9 07/26/2018   HGB 11.3 (L) 07/26/2018   HCT 35.3 (L) 07/26/2018   MCV 84.2 07/26/2018   PLT 228 07/26/2018   Lab Results  Component Value Date   NA 140 07/26/2018   K 3.9  07/26/2018   CO2 26 07/26/2018   GLUCOSE 108 (H) 07/26/2018   BUN 13 07/26/2018   CREATININE 1.15 (H) 07/26/2018   BILITOT 0.5 07/26/2018   ALKPHOS 47 07/26/2018   AST 15 07/26/2018   ALT 21 07/26/2018   PROT 7.5 07/26/2018   ALBUMIN 3.0 (L) 07/26/2018   CALCIUM 9.1 07/26/2018   ANIONGAP 11 07/26/2018   No results found for: CHOL No results found for: HDL No results found for: LDLCALC No results found for: TRIG No results found for: CHOLHDL No results found for: [id number]    Assessment & Plan:  1. Peripheral vascular disease with claudication (HCC); 6.  Encounter for examination following treatment of the hospital Patient status post 07/26/2018 emergency department visit for foot pain and patient had ABIs with resting left ankle-brachial index indicating mild left lower extremity arterial disease in the left toe brachial index was also abnormal in the right toe brachial index  was abnormal.  Patient also with peripheral neuropathy.  Patient was given gabapentin in the ED as well as prescription for gabapentin at discharge with improvement in pain.  ED notes were previously previewed and vascular referral was already placed.  Per caregiver, patient is scheduled for vascular surgery appointment possibly on July 17.  Patient will have lipid panel and BMP at today's visit.  She did have a mild increase in creatinine at the emergency department at 1.15 on labs. - Lipid panel - Basic Metabolic Panel  2. Essential hypertension Patient at today's visit reports that she tends to get some chest discomfort about 2 hours after taking her lisinopril-HCTZ therefore will have patient discontinue this medication.  Prescription provided for refill of amlodipine and patient is encouraged to have nurse visit for blood pressure recheck in approximately 2 weeks. - amLODipine (NORVASC) 5 MG tablet; Take 1 tablet (5 mg total) by mouth daily. To lower blood pressure  Dispense: 30 tablet; Refill: 3  3. Low back  pain with radiation Order placed for patient to have an x-ray of the low back due to her lumbar radiculopathy on exam.  Refill provided for Tylenol 3 to help with her pain as well as prednisone taper for radiculopathy and refill of gabapentin. - DG Lumbar Spine Complete; Future - acetaminophen-codeine (TYLENOL #3) 300-30 MG tablet; Take 1 tablet by mouth every 6 (six) hours as needed for moderate pain.  Dispense: 60 tablet; Refill: 3 - predniSONE (DELTASONE) 20 MG tablet; 2 pills once per day for 5 days; take after eating  Dispense: 10 tablet; Refill: 0 - gabapentin (NEURONTIN) 100 MG capsule; Take 1 capsule (100 mg total) by mouth 3 (three) times daily.  Dispense: 90 capsule; Refill: 6  4. Pain in both feet Patient's foot pain is likely related to a combination of lumbar radiculopathy as well as recent findings of peripheral vascular disease for which patient has been referred to vascular surgery for further follow-up.  Patient however has some tenderness at the head of the left MTP joint and has prominent right MTP joint and will have her obtain x-rays of her feet to look for any findings consistent with gout or osteoarthritis which may also be causing her foot pain - DG Foot Complete Left; Future - DG Foot Complete Right; Future - gabapentin (NEURONTIN) 100 MG capsule; Take 1 capsule (100 mg total) by mouth 3 (three) times daily.  Dispense: 90 capsule; Refill: 6  5. Chronic pain of both knees Patient with bilateral knee pain with osteoarthritis and orthopedics has discussed possible right knee replacement with the patient.  Refill provided of Tylenol 3 - acetaminophen-codeine (TYLENOL #3) 300-30 MG tablet; Take 1 tablet by mouth every 6 (six) hours as needed for moderate pain.  Dispense: 60 tablet; Refill: 3  An After Visit Summary was printed and given to the patient.   Follow-up: Return in about 2 weeks (around 08/22/2018) for HTN, pain follow-up.   Antony Blackbird, MD

## 2018-08-08 NOTE — Progress Notes (Signed)
Both ear pain  Both foot pain  Med refills   Neuropathy pain in both legs and feet

## 2018-08-09 ENCOUNTER — Other Ambulatory Visit: Payer: Self-pay | Admitting: Family Medicine

## 2018-08-09 DIAGNOSIS — E7800 Pure hypercholesterolemia, unspecified: Secondary | ICD-10-CM

## 2018-08-09 LAB — BASIC METABOLIC PANEL WITH GFR
BUN/Creatinine Ratio: 15 (ref 12–28)
BUN: 18 mg/dL (ref 8–27)
CO2: 26 mmol/L (ref 20–29)
Calcium: 9.5 mg/dL (ref 8.7–10.3)
Chloride: 101 mmol/L (ref 96–106)
Creatinine, Ser: 1.2 mg/dL — ABNORMAL HIGH (ref 0.57–1.00)
GFR calc Af Amer: 53 mL/min/1.73 — ABNORMAL LOW
GFR calc non Af Amer: 46 mL/min/1.73 — ABNORMAL LOW
Glucose: 86 mg/dL (ref 65–99)
Potassium: 4.1 mmol/L (ref 3.5–5.2)
Sodium: 143 mmol/L (ref 134–144)

## 2018-08-09 LAB — LIPID PANEL
Chol/HDL Ratio: 4.7 ratio — ABNORMAL HIGH (ref 0.0–4.4)
Cholesterol, Total: 211 mg/dL — ABNORMAL HIGH (ref 100–199)
HDL: 45 mg/dL
LDL Calculated: 150 mg/dL — ABNORMAL HIGH (ref 0–99)
Triglycerides: 79 mg/dL (ref 0–149)
VLDL Cholesterol Cal: 16 mg/dL (ref 5–40)

## 2018-08-09 MED ORDER — ROSUVASTATIN CALCIUM 10 MG PO TABS: 10.0000 mg | ORAL_TABLET | Freq: Every day | ORAL | 1 refills | Status: DC

## 2018-08-09 NOTE — Progress Notes (Signed)
Patient ID: [name] [name], female   DOB: Dec 20, 1950, 68 y.o.   MRN: [id number]   Patient with LDL of 150 on recent lipid panel.  Prescription will be sent to her pharmacy for Crestor 10 mg daily and patient will be notified of the results and the need to start medication to help lower her cholesterol.  Vascular surgery referral pending due to abnormal ABIs done in the emergency department at a recent visit and patient with complaint of pain in her feet

## 2018-08-17 ENCOUNTER — Other Ambulatory Visit: Payer: Self-pay

## 2018-08-17 ENCOUNTER — Ambulatory Visit (INDEPENDENT_AMBULATORY_CARE_PROVIDER_SITE_OTHER): Payer: Self-pay | Admitting: Vascular Surgery

## 2018-08-17 ENCOUNTER — Ambulatory Visit (HOSPITAL_COMMUNITY)
Admission: RE | Admit: 2018-08-17 | Discharge: 2018-08-17 | Disposition: A | Discharge status: Home / Self Care | Payer: Self-pay | Source: Ambulatory Visit | Attending: Family Medicine | Admitting: Family Medicine

## 2018-08-17 ENCOUNTER — Encounter: Payer: Self-pay | Admitting: Vascular Surgery

## 2018-08-17 VITALS — BP 150/82 | HR 56 | Temp 97.1°F | Resp 20 | Ht 64.0 in | Wt 190.0 lb

## 2018-08-17 DIAGNOSIS — M79672 Pain in left foot: Secondary | ICD-10-CM | POA: Insufficient documentation

## 2018-08-17 DIAGNOSIS — I739 Peripheral vascular disease, unspecified: Secondary | ICD-10-CM

## 2018-08-17 DIAGNOSIS — M79671 Pain in right foot: Secondary | ICD-10-CM | POA: Insufficient documentation

## 2018-08-17 NOTE — Progress Notes (Signed)
Patient ID: [name] [name], female   DOB: August 08, 1950, 69 y.o.   MRN: [id number]  Reason for Consult: No chief complaint on file.   Referred by Antony Blackbird, MD  Subjective:     HPI:  [name] [name] is a 68 y.o. female sent for evaluation bilateral foot pain.  She is originally from the [place] speaks primarily [place].  Does not have a tissue loss ulceration.  Does not walk much currently in a wheelchair.  Denies any claudication.  Has never had lower extremity intervention.  All information obtained through her interpreter.  Past Medical History:  Diagnosis Date  . Hypertension    Family History  Family history unknown: Yes   Past Surgical History:  Procedure Laterality Date  . NO PAST SURGERIES      Short Social History:  Social History   Tobacco Use  . Smoking status: Never Smoker  . Smokeless tobacco: Never Used  Substance Use Topics  . Alcohol use: Never    Frequency: Never    No Known Allergies  Current Outpatient Medications  Medication Sig Dispense Refill  . acetaminophen (TYLENOL) 500 MG tablet Take 1,000 mg by mouth every 6 (six) hours as needed for fever.    Marland Kitchen acetaminophen-codeine (TYLENOL #3) 300-30 MG tablet Take 1 tablet by mouth every 6 (six) hours as needed for moderate pain. 60 tablet 3  . amLODipine (NORVASC) 5 MG tablet Take 1 tablet (5 mg total) by mouth daily. To lower blood pressure 30 tablet 3  . Calcium Carbonate (CALCIUM 600 PO) Take 1,200 mg by mouth 2 (two) times daily.     Marland Kitchen gabapentin (NEURONTIN) 100 MG capsule Take 1 capsule (100 mg total) by mouth 3 (three) times daily. 90 capsule 6  . predniSONE (DELTASONE) 20 MG tablet 2 pills once per day for 5 days; take after eating 10 tablet 0  . rosuvastatin (CRESTOR) 10 MG tablet Take 1 tablet (10 mg total) by mouth daily. To lower cholesterol 90 tablet 1  . diclofenac sodium (VOLTAREN) 1 % GEL Apply 4 g topically 4 (four) times daily. Apply to feet 4 times daily PRN for pain.  (Patient not taking: Reported on 08/08/2018) 100 g 0  . mupirocin ointment (BACTROBAN) 2 % Apply bid to wound (Patient not taking: Reported on 07/17/2018) 22 g 0  . triamcinolone cream (KENALOG) 0.1 % Apply to dry skin 1-2 times daily sparingly (Patient not taking: Reported on 07/17/2018) 30 g 2   No current facility-administered medications for this visit.     Review of Systems  Constitutional:  Constitutional negative. HENT: HENT negative.  Eyes: Eyes negative.  Cardiovascular: Positive for leg swelling.  GI: Gastrointestinal negative.  GU: Genitourinary negative. Musculoskeletal: Positive for leg pain.  Skin: Skin negative.  Neurological: Neurological negative. Hematologic: Hematologic/lymphatic negative.  Psychiatric: Psychiatric negative.        Objective:  Objective   Vitals:   08/17/18 1123  BP: (!) 150/82  Pulse: (!) 56  Resp: 20  Temp: (!) 97.1 F (36.2 C)  SpO2: 97%  Weight: 190 lb (86.2 kg)  Height: 5\' 4"  (1.626 m)   Body mass index is 32.61 kg/m.  Physical Exam HENT:     Head: Normocephalic.  Neck:     Musculoskeletal: Normal range of motion and neck supple. No muscular tenderness.  Cardiovascular:     Rate and Rhythm: Normal rate.     Pulses:          Carotid pulses are 2+ on  the right side and 2+ on the left side.      Radial pulses are 2+ on the right side and 2+ on the left side.       Popliteal pulses are 2+ on the right side and 2+ on the left side.       Dorsalis pedis pulses are 2+ on the right side and 2+ on the left side.  Musculoskeletal: Normal range of motion.        General: No swelling.  Skin:    General: Skin is warm.     Capillary Refill: Capillary refill takes less than 2 seconds.  Neurological:     Mental Status: She is alert.  Psychiatric:        Mood and Affect: Mood normal.        Behavior: Behavior normal.        Thought Content: Thought content normal.        Judgment: Judgment normal.     Data: I independently  interpreted her ABIs to be 0.98 right and triphasic and 0.83 left and biphasic.  Toe pressures on the right 116 on the left 75     Assessment/Plan:     68 year old female follows up for bilateral lower extremity pain.  ABIs are slightly abnormal on the left with a toe pressure of 75.  She does have a palpable dorsalis pedis pulse on the left as well as the right.  With this I would not recommend any vascular intervention.  She also does not appear to walk which would further give me pause to intervene particularly given her preserved ABIs and lack of ulceration.  She can follow-up on an as-needed basis peer     Maeola HarmanBrandon Christopher  MD [facility]

## 2018-08-21 ENCOUNTER — Telehealth: Payer: Self-pay | Admitting: *Deleted

## 2018-08-21 NOTE — Telephone Encounter (Signed)
She needs to follow-up with her Orthopedic doctor regarding her back and knee pain

## 2018-08-21 NOTE — Telephone Encounter (Signed)
Patient verified DOB Patient is aware of having heel spurs in the left and right foot, patient also has moderate arthritis at the great toe on the left foot.

## 2018-08-22 NOTE — Telephone Encounter (Signed)
Voicemail states to give a call back to [place] with [facility] at [phone]. Patient is aware to FU with Orthopedic for spurs and arthritis.

## 2018-09-04 ENCOUNTER — Telehealth: Payer: Self-pay | Admitting: Pediatric Intensive Care

## 2018-09-04 NOTE — Telephone Encounter (Signed)
Call to [facility] to inquire about dental services and possible extraction to qualify for knee replacement. CN spoke with [place]. She advised that she would have someone form dental clinic call back. Lisette Abu RN BSN CNP [phone]

## 2018-09-05 ENCOUNTER — Telehealth: Payer: Self-pay | Admitting: Pediatric Intensive Care

## 2018-09-05 NOTE — Telephone Encounter (Signed)
Return call to client with [place] interpretation. ID x 2 identifiers. Client is crying and inconsolable making interpretation very difficult. Client states that she is going to be forced out of housing in 5 days. Client said that she is safe but very upset that she will be asked to leave housing. Cn asked client if she is physically safe for now and she replied she is. CN advised client that CN will speak with her case management team from [facility] and come up with plan to ensure that she is not forced out of housing. Client thanks CN. Lisette Abu RN BSN CNP [phone]

## 2018-09-06 ENCOUNTER — Telehealth: Payer: Self-pay

## 2018-09-06 NOTE — Telephone Encounter (Signed)
Call received from [place] Hussey,RN/CN who noted that she will be dropping off patient's dental records for Dr Fulp's review.  The patient may be in need of dental work prior to her orthopedic surgery.

## 2018-09-24 ENCOUNTER — Ambulatory Visit: Payer: Self-pay | Admitting: Family Medicine

## 2018-09-26 ENCOUNTER — Telehealth: Payer: Self-pay

## 2018-09-26 ENCOUNTER — Encounter: Payer: Self-pay | Admitting: Family Medicine

## 2018-09-26 ENCOUNTER — Other Ambulatory Visit: Payer: Self-pay

## 2018-09-26 ENCOUNTER — Ambulatory Visit: Payer: Self-pay | Attending: Family Medicine | Admitting: Family Medicine

## 2018-09-26 VITALS — BP 150/84 | HR 71 | Temp 99.0°F | Ht 64.0 in | Wt 194.8 lb

## 2018-09-26 DIAGNOSIS — Z789 Other specified health status: Secondary | ICD-10-CM

## 2018-09-26 DIAGNOSIS — G8929 Other chronic pain: Secondary | ICD-10-CM

## 2018-09-26 DIAGNOSIS — K5903 Drug induced constipation: Secondary | ICD-10-CM

## 2018-09-26 DIAGNOSIS — Z603 Acculturation difficulty: Secondary | ICD-10-CM

## 2018-09-26 DIAGNOSIS — Z7409 Other reduced mobility: Secondary | ICD-10-CM

## 2018-09-26 DIAGNOSIS — Z872 Personal history of diseases of the skin and subcutaneous tissue: Secondary | ICD-10-CM

## 2018-09-26 DIAGNOSIS — L853 Xerosis cutis: Secondary | ICD-10-CM

## 2018-09-26 DIAGNOSIS — L97313 Non-pressure chronic ulcer of right ankle with necrosis of muscle: Secondary | ICD-10-CM

## 2018-09-26 DIAGNOSIS — T402X5A Adverse effect of other opioids, initial encounter: Secondary | ICD-10-CM

## 2018-09-26 DIAGNOSIS — Z758 Other problems related to medical facilities and other health care: Secondary | ICD-10-CM

## 2018-09-26 DIAGNOSIS — I10 Essential (primary) hypertension: Secondary | ICD-10-CM

## 2018-09-26 MED ORDER — TRIAMCINOLONE ACETONIDE 0.1 % EX CREA: TOPICAL_CREAM | CUTANEOUS | 2 refills | Status: DC

## 2018-09-26 MED ORDER — MUPIROCIN 2 % EX OINT: TOPICAL_OINTMENT | CUTANEOUS | 0 refills | Status: DC

## 2018-09-26 MED ORDER — DICLOFENAC SODIUM 1 % TD GEL: 4.0000 g | Freq: Four times a day (QID) | TRANSDERMAL | 99 refills | Status: DC

## 2018-09-26 MED ORDER — DOCUSATE SODIUM 100 MG PO CAPS: ORAL_CAPSULE | ORAL | 99 refills | Status: DC

## 2018-09-26 MED ORDER — AMLODIPINE BESYLATE 5 MG PO TABS: 5.0000 mg | ORAL_TABLET | Freq: Every day | ORAL | 3 refills | Status: DC

## 2018-09-26 NOTE — Progress Notes (Signed)
Established Patient Office Visit  Subjective:  Patient ID: [name] [name], female    DOB: [birth year]  Age: 68 y.o. MRN: [id number]  Due to a language barrier, patient is accompanied by a patient advocate who wishes to act as interpreter and patient agrees to this arrangement and declined use of [employer] video  Communication system  CC:  Chief Complaint  Patient presents with  . Hypertension  . Medication Refill    HPI Kaetlin Bullen presents for follow-up of hypertension and chronic medical issues.  Patient is accompanied by interpreter who may also be a caseworker for the patient.  Patient was having issues with housing but is now living in an apartment with 2 other women and the other 2 women speak a similar language to the patient.  1 of the women's younger and currently employed as she manages the [employer].  Patient is unable to work due to her physical disabilities and does not have medical insurance due to her status is an immigrant.  For now, patient with a stable housing situation.       Patient continues to have decline in mobility due to her chronic knee pain related to osteoarthritis.  Patient has been unable to have surgery for right knee replacement due to her orthopedic doctors wish for her to have dental interventions for poor dentition prior to her surgery.  She was able to have some teeth pulled.  Patient however requires further extractions but does not have the financial resources for these to be done therefore there is an Chief Strategy Officer who is trying to help obtain funding for patient to have further dental care so that she can have her knee replacement surgery.          Patient reports continued, chronic pain in the right knee which is worse with movement or attempted ambulation.  She would like to have refill of Voltaren gel but she also has remaining Tylenol 3 for pain.  Upon questioning, she has had some constipation since being on this medication.   She denies any blood in the stools or black stools and no current issues with abdominal pain.  She does use a laxative as needed but stool is small and hard otherwise.         She also needs a refill on her blood pressure medication, amlodipine.  She has had some issues with swelling in her ankles and feet.  She did see the vascular doctor regarding her decreased pulses.  She has not had any headaches or dizziness related to her blood pressure.  She does not think that the swelling in her legs and feet is related to her blood pressure medication.  She does tend to sit often and has decreased mobility/attempts to walk less due to her chronic knee pain.  She does use a crutch or cane when trying to ambulate.  She denies any recent falls.  Past Medical History:  Diagnosis Date  . Hypertension     Past Surgical History:  Procedure Laterality Date  . NO PAST SURGERIES      Family History  Family history unknown: Yes    Social History   Socioeconomic History  . Marital status: Widowed    Spouse name: Not on file  . Number of children: Not on file  . Years of education: Not on file  . Highest education level: Not on file  Occupational History  . Not on file  Social Needs  . Emergency planning/management officer  strain: Not on file  . Food insecurity    Worry: Not on file    Inability: Not on file  . Transportation needs    Medical: Not on file    Non-medical: Not on file  Tobacco Use  . Smoking status: Never Smoker  . Smokeless tobacco: Never Used  Substance and Sexual Activity  . Alcohol use: Never    Frequency: Never  . Drug use: Never  . Sexual activity: Not Currently  Lifestyle  . Physical activity    Days per week: Not on file    Minutes per session: Not on file  . Stress: Not on file  Relationships  . Social Musicianconnections    Talks on phone: Not on file    Gets together: Not on file    Attends religious service: Not on file    Active member of club or organization: Not on file     Attends meetings of clubs or organizations: Not on file    Relationship status: Not on file  . Intimate partner violence    Fear of current or ex partner: Not on file    Emotionally abused: Not on file    Physically abused: Not on file    Forced sexual activity: Not on file  Other Topics Concern  . Not on file  Social History Narrative  . Not on file    Outpatient Medications Prior to Visit  Medication Sig Dispense Refill  . acetaminophen (TYLENOL) 500 MG tablet Take 1,000 mg by mouth every 6 (six) hours as needed for fever.    Marland Kitchen. acetaminophen-codeine (TYLENOL #3) 300-30 MG tablet Take 1 tablet by mouth every 6 (six) hours as needed for moderate pain. 60 tablet 3  . amLODipine (NORVASC) 5 MG tablet Take 1 tablet (5 mg total) by mouth daily. To lower blood pressure 30 tablet 3  . Calcium Carbonate (CALCIUM 600 PO) Take 1,200 mg by mouth 2 (two) times daily.     Marland Kitchen. gabapentin (NEURONTIN) 100 MG capsule Take 1 capsule (100 mg total) by mouth 3 (three) times daily. 90 capsule 6  . predniSONE (DELTASONE) 20 MG tablet 2 pills once per day for 5 days; take after eating 10 tablet 0  . rosuvastatin (CRESTOR) 10 MG tablet Take 1 tablet (10 mg total) by mouth daily. To lower cholesterol 90 tablet 1  . triamcinolone cream (KENALOG) 0.1 % Apply to dry skin 1-2 times daily sparingly 30 g 2  . diclofenac sodium (VOLTAREN) 1 % GEL Apply 4 g topically 4 (four) times daily. Apply to feet 4 times daily PRN for pain. (Patient not taking: Reported on 08/08/2018) 100 g 0  . mupirocin ointment (BACTROBAN) 2 % Apply bid to wound (Patient not taking: Reported on 07/17/2018) 22 g 0   No facility-administered medications prior to visit.     No Known Allergies  ROS Review of Systems  Constitutional: Positive for fatigue. Negative for chills and fever.  HENT: Negative for sore throat and trouble swallowing.   Respiratory: Negative for cough and shortness of breath.   Cardiovascular: Positive for leg swelling.  Negative for chest pain and palpitations.  Gastrointestinal: Positive for constipation. Negative for abdominal pain, blood in stool, diarrhea and nausea.  Endocrine: Negative for polydipsia and polyphagia.  Genitourinary: Negative for dysuria and frequency.  Musculoskeletal: Positive for arthralgias, gait problem and joint swelling.  Neurological: Negative for dizziness and headaches.       Interpreter believes that patient may be having some issues with  memory-she mentions this in English so that the patient is not aware  Hematological: Negative for adenopathy. Does not bruise/bleed easily.      Objective:    Physical Exam  Constitutional: She is oriented to person, place, and time. She appears well-developed and well-nourished.  Well-nourished well-developed overweight for height older female in no acute distress while sitting still in manual wheelchair.  Patient wearing a mask as per clinic protocol due to coated precautions  Neck: Normal range of motion. Neck supple. No JVD present.  Cardiovascular: Normal rate and regular rhythm.  Pulmonary/Chest: Effort normal and breath sounds normal.  Abdominal: Soft. She exhibits distension. There is no abdominal tenderness. There is no rebound and no guarding.  Musculoskeletal:        General: Tenderness and deformity present.     Comments: Patient with bilateral right and left knee joint line tenderness.  Tenderness is greater on the right.  Patient with slight angulation of the right knee/lower leg.  Patient with mild nonpitting pedal and ankle edema; no CVA tenderness  Lymphadenopathy:    She has no cervical adenopathy.  Neurological: She is alert and oriented to person, place, and time.  Skin: Skin is warm and dry.  Patient has some mild bilateral dry skin and slight hyperpigmentation of skin on the upper cheeks beneath the eyes bilaterally  Psychiatric: She has a normal mood and affect. Her behavior is normal.  Slightly flattened affect  initially but patient interacted appropriately during today's visit  Nursing note and vitals reviewed.   BP (!) 150/84 (BP Location: Left Arm, Patient Position: Sitting, Cuff Size: Large)   Pulse 71   Temp 99 F (37.2 C) (Oral)   Ht 5\' 4"  (1.626 m)   Wt 194 lb 12.8 oz (88.4 kg)   SpO2 96%   BMI 33.44 kg/m  Wt Readings from Last 3 Encounters:  09/26/18 194 lb 12.8 oz (88.4 kg)  08/17/18 190 lb (86.2 kg)  05/25/18 190 lb 9.6 oz (86.5 kg)     Health Maintenance Due  Topic Date Due  . TETANUS/TDAP  01/26/1968  . MAMMOGRAM  01/26/1999  . COLONOSCOPY  01/26/1999  . DEXA SCAN  01/25/2014  . PNA vac Low Risk Adult (1 of 2 - PCV13) 01/25/2014  . INFLUENZA VACCINE  09/01/2018    No results found for: TSH Lab Results  Component Value Date   WBC 7.9 07/26/2018   HGB 11.3 (L) 07/26/2018   HCT 35.3 (L) 07/26/2018   MCV 84.2 07/26/2018   PLT 228 07/26/2018   Lab Results  Component Value Date   NA 143 08/08/2018   K 4.1 08/08/2018   CO2 26 08/08/2018   GLUCOSE 86 08/08/2018   BUN 18 08/08/2018   CREATININE 1.20 (H) 08/08/2018   BILITOT 0.5 07/26/2018   ALKPHOS 47 07/26/2018   AST 15 07/26/2018   ALT 21 07/26/2018   PROT 7.5 07/26/2018   ALBUMIN 3.0 (L) 07/26/2018   CALCIUM 9.5 08/08/2018   ANIONGAP 11 07/26/2018   Lab Results  Component Value Date   CHOL 211 (H) 08/08/2018   Lab Results  Component Value Date   HDL 45 08/08/2018   Lab Results  Component Value Date   LDLCALC 150 (H) 08/08/2018   Lab Results  Component Value Date   TRIG 79 08/08/2018   Lab Results  Component Value Date   CHOLHDL 4.7 (H) 08/08/2018   No results found for: HGBA1C    Assessment & Plan:   1. Essential  hypertension Refill provided of amlodipine 5 mg for continued control of hypertension.  Low-sodium/DASH diet also encouraged.  Patient is unfortunately limited in her ability to exercise secondary to chronic pain from osteoarthritis of the knees.  Encourage weight loss through  dietary change which was discussed at today's visit. - amLODipine (NORVASC) 5 MG tablet; Take 1 tablet (5 mg total) by mouth daily. To lower blood pressure  Dispense: 90 tablet; Refill: 3  2. Chronic pain of both knees; impairment of gait Patient with chronic pain of both knees secondary to osteoarthritis but unfortunately surgery has been delayed as patient also needs to have dental work for removal of teeth with dental caries prior to her operation.  Attempts are being made to obtain financial assistance for patient to have surgery done per her caseworker and the care management coordinator for our office was made aware of the need to follow-up with patient's caseworker and another care manager who is working to assist patient. - diclofenac sodium (VOLTAREN) 1 % GEL; Apply 4 g topically 4 (four) times daily. Apply to feet 4 times daily PRN for pain.  Dispense: 100 g; Refill: prn  3. Constipation due to opioid therapy Patient with constipation which is likely related to her use of Tylenol 3 for her chronic knee pain.  Prescription provided for Colace to take 1 or 2 tablets at bedtime to help with constipation and patient should also increase water and fiber in the diet. - docusate sodium (COLACE) 100 MG capsule; Take one or two pills at bedtime to help with constipaiton  Dispense: 60 capsule; Refill: prn   4. History of skin ulcer of lower extremity Patient has history of a recurrent ulceration on the right lateral lower leg and she requests a refill of Bactroban to use as needed for any recurrence of skin irritation in this area. (Refill of Bactroban was requested by patient to CMA after visit as patient forgot to mention earlier.) - mupirocin ointment (BACTROBAN) 2 %; Apply bid to wound  Dispense: 22 g; Refill: 0  5. Dry Skin  Patient with complaint of some dry skin areas on the face/cheeks.  Prescription was requested for refill of triamcinolone which she has used in the past.  Through  interpreter, patient was made aware that she should use the cream sparingly to help prevent facial skin thinning and also made aware that this medication can sometimes cause hypopigmentation of skin.  6. Language barrier Patient with a language barrier as she speaks a uncommon dialect but fortunately also speaks [name] as well and patient is accompanied at today's visit by a case manager who is been helping patient.  An After Visit Summary was printed and given to the patient.  Follow-up: Return in about 3 months (around 12/27/2018) for chronic issues.   Cain Saupe, MD

## 2018-09-26 NOTE — Telephone Encounter (Signed)
Call placed to [place], RN /CN to check on status of dental referral.  She explained that she is working in getting patient to the [facility] in [place] that includes funding for transportation and the procedure through the congregational nurse program.  This information was shared with Dr Chapman Fitch.

## 2018-10-15 ENCOUNTER — Telehealth: Payer: Self-pay | Admitting: Orthopedic Surgery

## 2018-10-15 NOTE — Telephone Encounter (Signed)
[name] [name] with the [facility] Nurse Program calling in regards to this patient and the request from Dr. Marlou Sa for a dental consult prior to being a surgical candidate for a knee replacement.  [name] is trying to assist from a case management prospective to get the patient ready for surgery. There are some other issues regarding potential need for short term nursing care after surgery.  Patient is not insured, so she is trying to get this taken care of ahead of time.  She can be contacted at the following number:   [phone]

## 2018-10-16 ENCOUNTER — Telehealth: Payer: Self-pay | Admitting: Pediatric Intensive Care

## 2018-10-16 NOTE — Telephone Encounter (Signed)
Client ID x2 via [facility] phone interpretation. CN verified her identity. CN asks what client needs this morning. Client is aware that she needs to have dental procedure prior to knee surgery. Client states that she is sad that she has been in the [place] for 18 months and she has not been able to get anything solved. Client states that she needs the staff at [facility] to assist her with her immigration case. CN states that there are many steps that need to be completed in order to put together the surgery. CN reassured client that the team continues to work to secure medical resources. Client requests winter clothes and shoes.  Lisette Abu RN BSN CNP [phone]

## 2018-10-16 NOTE — Telephone Encounter (Signed)
Tried calling [place], no answer LMVM for her advising was returning her call to discuss below message.

## 2018-10-17 ENCOUNTER — Telehealth: Payer: Self-pay | Admitting: Orthopedic Surgery

## 2018-10-17 NOTE — Telephone Encounter (Signed)
[place] with [facility] nursing program called left voicemail message that she will be available tomorrow  From 9:00am-8:00pm to discuss patient. The number to contact [place] is [phone]

## 2018-10-17 NOTE — Telephone Encounter (Signed)
Will try to reach out to her on Thursday.

## 2018-10-18 ENCOUNTER — Other Ambulatory Visit: Payer: Self-pay | Admitting: Family Medicine

## 2018-10-18 ENCOUNTER — Telehealth: Payer: Self-pay

## 2018-10-18 DIAGNOSIS — K089 Disorder of teeth and supporting structures, unspecified: Secondary | ICD-10-CM

## 2018-10-18 NOTE — Telephone Encounter (Signed)
Referral placed.

## 2018-10-18 NOTE — Progress Notes (Signed)
Patient ID: [name] [name], female   DOB: December 12, 1950, 68 y.o.   MRN: [id number]   Phone message left from congregational nursing that patient needs referral to [facility] as they will do dental work without orange card. Referral placed

## 2018-10-18 NOTE — Telephone Encounter (Signed)
Tried calling her. No answer. LMVM advising was returning her call

## 2018-10-18 NOTE — Telephone Encounter (Signed)
Call received from [place] Hus[place],RN/Congregational Nurse.  She explained that the patient will need dental work prior to having knee replacement.  She was seen at [facility] and recommendations were made for dental procedures but patient has no insurance and no income. She needs to renew her [place] is requesting a dental referral be made to [facility] as they will see uninsured patients with an [employer].

## 2019-01-08 ENCOUNTER — Telehealth: Payer: Self-pay | Admitting: Pediatric Intensive Care

## 2019-01-08 NOTE — Telephone Encounter (Signed)
Attempted return call to client. Voice mail is not set up. Will attempt again this afternoon. Lisette Abu RN BSN CNP [phone]

## 2019-01-08 NOTE — Telephone Encounter (Signed)
Call to client with [facility] [phone]- client IDx2. CN explained that she will have a telephone meeting with Early Tamala Julian and Haynes Hoehn tomorrow to discuss potential for scheduling knee replacement surgery. Client states that she also needs documents for her attorney. CN will follow up with client via telephone call on Tuesday between 0900-1100.

## 2019-01-10 ENCOUNTER — Telehealth: Payer: Self-pay | Admitting: Pediatric Intensive Care

## 2019-01-10 NOTE — Telephone Encounter (Signed)
Left HIPAA compliant message for staff to return call regarding picking up client dental records. Lisette Abu RN BSN CNP [phone]

## 2019-02-12 ENCOUNTER — Telehealth: Payer: Self-pay | Admitting: Pediatric Intensive Care

## 2019-02-12 NOTE — Telephone Encounter (Signed)
Call to client with [facility] Interpretation [phone]. Verified client IDx2. CN advised client that the hospital has all elective surgeries on hold. CN advised client that she does not know when those surgeries will start again. CN asked client about medication status and she says she has enough medication at present. Cn will assist with making follow up appointment with ortho and will ask regarding PT referral while waiting for surgery. Shann Medal RN BSN CNP [phone]

## 2019-02-19 ENCOUNTER — Telehealth: Payer: Self-pay | Admitting: Pediatric Intensive Care

## 2019-02-19 NOTE — Telephone Encounter (Signed)
Call to client. [name] [name] with [name] interpretation. ID verified x2. CN advised client of upcoming orthopedics appointment. She will have transportation. She has a "sick' appointment with Dr Jillyn Hidden this Friday. Client states that she has a cough, nasal and ear congestion and body aches. No fever reported. CN will contact PCP to advise regarding appointment and COVID testing.  Shann Medal RN BSN CNP [phone]

## 2019-02-19 NOTE — Telephone Encounter (Signed)
I believe that since she is having respiratory symptoms, she would not be able to come into the office or visit but be seen as a virtual visit.  The difficulty is that patient has a dialect that is difficult to find an interpreter for.  There is someone who usually comes with the patient who can help with interpretation who may be a volunteer or patient's case Production designer, theatre/television/film.  It may be best for patient to go to urgent care where she can be treated for her current respiratory illness or otherwise see if patient can be scheduled for testing at [facility] for Covid prior to her upcoming appointment on Friday?

## 2019-02-19 NOTE — Telephone Encounter (Signed)
The tele-visit would be okay but can you arrange for her to have COVID testing if she would like to have this done.

## 2019-02-22 ENCOUNTER — Encounter (HOSPITAL_COMMUNITY): Payer: Self-pay | Admitting: Emergency Medicine

## 2019-02-22 ENCOUNTER — Ambulatory Visit: Payer: Self-pay | Attending: Family Medicine | Admitting: Family Medicine

## 2019-02-22 ENCOUNTER — Other Ambulatory Visit: Payer: Self-pay

## 2019-02-22 ENCOUNTER — Inpatient Hospital Stay (HOSPITAL_COMMUNITY)
Admission: EM | Admit: 2019-02-22 | Discharge: 2019-03-05 | DRG: 177 | Disposition: A | Discharge status: Home Health | Payer: HRSA Program | Attending: Internal Medicine | Admitting: Internal Medicine

## 2019-02-22 ENCOUNTER — Emergency Department (HOSPITAL_COMMUNITY): Payer: HRSA Program

## 2019-02-22 ENCOUNTER — Encounter: Payer: Self-pay | Admitting: Family Medicine

## 2019-02-22 DIAGNOSIS — M170 Bilateral primary osteoarthritis of knee: Secondary | ICD-10-CM | POA: Diagnosis present

## 2019-02-22 DIAGNOSIS — R6889 Other general symptoms and signs: Secondary | ICD-10-CM | POA: Diagnosis present

## 2019-02-22 DIAGNOSIS — E871 Hypo-osmolality and hyponatremia: Secondary | ICD-10-CM | POA: Diagnosis present

## 2019-02-22 DIAGNOSIS — I4891 Unspecified atrial fibrillation: Secondary | ICD-10-CM | POA: Diagnosis not present

## 2019-02-22 DIAGNOSIS — I10 Essential (primary) hypertension: Secondary | ICD-10-CM | POA: Diagnosis present

## 2019-02-22 DIAGNOSIS — E785 Hyperlipidemia, unspecified: Secondary | ICD-10-CM | POA: Diagnosis present

## 2019-02-22 DIAGNOSIS — J9601 Acute respiratory failure with hypoxia: Secondary | ICD-10-CM | POA: Diagnosis present

## 2019-02-22 DIAGNOSIS — Z23 Encounter for immunization: Secondary | ICD-10-CM | POA: Diagnosis not present

## 2019-02-22 DIAGNOSIS — E875 Hyperkalemia: Secondary | ICD-10-CM | POA: Diagnosis present

## 2019-02-22 DIAGNOSIS — R0602 Shortness of breath: Secondary | ICD-10-CM

## 2019-02-22 DIAGNOSIS — Z79899 Other long term (current) drug therapy: Secondary | ICD-10-CM

## 2019-02-22 DIAGNOSIS — R531 Weakness: Secondary | ICD-10-CM

## 2019-02-22 DIAGNOSIS — R739 Hyperglycemia, unspecified: Secondary | ICD-10-CM | POA: Diagnosis not present

## 2019-02-22 DIAGNOSIS — E0781 Sick-euthyroid syndrome: Secondary | ICD-10-CM | POA: Diagnosis present

## 2019-02-22 DIAGNOSIS — E1159 Type 2 diabetes mellitus with other circulatory complications: Secondary | ICD-10-CM | POA: Diagnosis present

## 2019-02-22 DIAGNOSIS — N182 Chronic kidney disease, stage 2 (mild): Secondary | ICD-10-CM | POA: Diagnosis present

## 2019-02-22 DIAGNOSIS — T380X5A Adverse effect of glucocorticoids and synthetic analogues, initial encounter: Secondary | ICD-10-CM | POA: Diagnosis not present

## 2019-02-22 DIAGNOSIS — I959 Hypotension, unspecified: Secondary | ICD-10-CM | POA: Diagnosis not present

## 2019-02-22 DIAGNOSIS — N179 Acute kidney failure, unspecified: Secondary | ICD-10-CM | POA: Diagnosis present

## 2019-02-22 DIAGNOSIS — R059 Cough, unspecified: Secondary | ICD-10-CM

## 2019-02-22 DIAGNOSIS — G629 Polyneuropathy, unspecified: Secondary | ICD-10-CM | POA: Diagnosis present

## 2019-02-22 DIAGNOSIS — U071 COVID-19: Principal | ICD-10-CM | POA: Diagnosis present

## 2019-02-22 DIAGNOSIS — I129 Hypertensive chronic kidney disease with stage 1 through stage 4 chronic kidney disease, or unspecified chronic kidney disease: Secondary | ICD-10-CM | POA: Diagnosis present

## 2019-02-22 DIAGNOSIS — R778 Other specified abnormalities of plasma proteins: Secondary | ICD-10-CM | POA: Diagnosis present

## 2019-02-22 DIAGNOSIS — T502X5A Adverse effect of carbonic-anhydrase inhibitors, benzothiadiazides and other diuretics, initial encounter: Secondary | ICD-10-CM | POA: Diagnosis not present

## 2019-02-22 DIAGNOSIS — B349 Viral infection, unspecified: Secondary | ICD-10-CM

## 2019-02-22 DIAGNOSIS — J1282 Pneumonia due to coronavirus disease 2019: Secondary | ICD-10-CM | POA: Diagnosis present

## 2019-02-22 DIAGNOSIS — R0902 Hypoxemia: Secondary | ICD-10-CM

## 2019-02-22 DIAGNOSIS — M1710 Unilateral primary osteoarthritis, unspecified knee: Secondary | ICD-10-CM | POA: Diagnosis present

## 2019-02-22 DIAGNOSIS — R05 Cough: Secondary | ICD-10-CM | POA: Diagnosis present

## 2019-02-22 DIAGNOSIS — R7303 Prediabetes: Secondary | ICD-10-CM | POA: Diagnosis present

## 2019-02-22 DIAGNOSIS — M179 Osteoarthritis of knee, unspecified: Secondary | ICD-10-CM | POA: Diagnosis present

## 2019-02-22 DIAGNOSIS — I482 Chronic atrial fibrillation, unspecified: Secondary | ICD-10-CM

## 2019-02-22 HISTORY — DX: COVID-19: U07.1

## 2019-02-22 HISTORY — DX: Acute respiratory failure with hypoxia: J96.01

## 2019-02-22 HISTORY — DX: Other general symptoms and signs: R68.89

## 2019-02-22 LAB — FERRITIN: Ferritin: 51 ng/mL (ref 11–307)

## 2019-02-22 LAB — CBC
HCT: 39.7 % (ref 36.0–46.0)
Hemoglobin: 13.2 g/dL (ref 12.0–15.0)
MCH: 26.8 pg (ref 26.0–34.0)
MCHC: 33.2 g/dL (ref 30.0–36.0)
MCV: 80.5 fL (ref 80.0–100.0)
Platelets: 205 10*3/uL (ref 150–400)
RBC: 4.93 MIL/uL (ref 3.87–5.11)
RDW: 16.6 % — ABNORMAL HIGH (ref 11.5–15.5)
WBC: 5.3 10*3/uL (ref 4.0–10.5)
nRBC: 0 % (ref 0.0–0.2)

## 2019-02-22 LAB — BASIC METABOLIC PANEL
Anion gap: 13 (ref 5–15)
BUN: 8 mg/dL (ref 8–23)
CO2: 26 mmol/L (ref 22–32)
Calcium: 8.7 mg/dL — ABNORMAL LOW (ref 8.9–10.3)
Chloride: 102 mmol/L (ref 98–111)
Creatinine, Ser: 1.04 mg/dL — ABNORMAL HIGH (ref 0.44–1.00)
GFR calc Af Amer: >60 mL/min (ref >60)
GFR calc non Af Amer: 54 mL/min — ABNORMAL LOW (ref >60)
Glucose, Bld: 94 mg/dL (ref 70–99)
Potassium: 3.5 mmol/L (ref 3.5–5.1)
Sodium: 141 mmol/L (ref 135–145)

## 2019-02-22 LAB — TRIGLYCERIDES: Triglycerides: 102 mg/dL (ref <150)

## 2019-02-22 LAB — TROPONIN I (HIGH SENSITIVITY)
Troponin I (High Sensitivity): 18 ng/L — ABNORMAL HIGH (ref <18)
Troponin I (High Sensitivity): 15 ng/L (ref <18)
Troponin I (High Sensitivity): 16 ng/L (ref <18)

## 2019-02-22 LAB — URINALYSIS, ROUTINE W REFLEX MICROSCOPIC
Bilirubin Urine: NEGATIVE
Glucose, UA: NEGATIVE mg/dL
Hgb urine dipstick: NEGATIVE
Ketones, ur: NEGATIVE mg/dL
Leukocytes,Ua: NEGATIVE
Nitrite: NEGATIVE
Protein, ur: NEGATIVE mg/dL
Specific Gravity, Urine: 1.008 (ref 1.005–1.030)
pH: 8 (ref 5.0–8.0)

## 2019-02-22 LAB — HEPATIC FUNCTION PANEL
ALT: 13 U/L (ref 0–44)
AST: 18 U/L (ref 15–41)
Albumin: 2.6 g/dL — ABNORMAL LOW (ref 3.5–5.0)
Alkaline Phosphatase: 37 U/L — ABNORMAL LOW (ref 38–126)
Bilirubin, Direct: 0.1 mg/dL (ref 0.0–0.2)
Indirect Bilirubin: 0.7 mg/dL (ref 0.3–0.9)
Total Bilirubin: 0.8 mg/dL (ref 0.3–1.2)
Total Protein: 7.1 g/dL (ref 6.5–8.1)

## 2019-02-22 LAB — BRAIN NATRIURETIC PEPTIDE: B Natriuretic Peptide: 190.4 pg/mL — ABNORMAL HIGH (ref 0.0–100.0)

## 2019-02-22 LAB — LACTATE DEHYDROGENASE: LDH: 253 U/L — ABNORMAL HIGH (ref 98–192)

## 2019-02-22 LAB — MAGNESIUM: Magnesium: 2 mg/dL (ref 1.7–2.4)

## 2019-02-22 LAB — LACTIC ACID, PLASMA: Lactic Acid, Venous: 1.3 mmol/L (ref 0.5–1.9)

## 2019-02-22 LAB — C-REACTIVE PROTEIN: CRP: 3.6 mg/dL — ABNORMAL HIGH (ref <1.0)

## 2019-02-22 LAB — SARS CORONAVIRUS 2 (TAT 6-24 HRS): SARS Coronavirus 2: POSITIVE — AB

## 2019-02-22 LAB — D-DIMER, QUANTITATIVE: D-Dimer, Quant: 2.37 ug/mL-FEU — ABNORMAL HIGH (ref 0.00–0.50)

## 2019-02-22 LAB — FIBRINOGEN: Fibrinogen: 583 mg/dL — ABNORMAL HIGH (ref 210–475)

## 2019-02-22 MED ORDER — IPRATROPIUM-ALBUTEROL 20-100 MCG/ACT IN AERS
2.0000 | INHALATION_SPRAY | Freq: Four times a day (QID) | RESPIRATORY_TRACT | Status: DC
  Filled 2019-02-22: qty 4

## 2019-02-22 MED ORDER — FLEET ENEMA 7-19 GM/118ML RE ENEM: 1.0000 | ENEMA | Freq: Once | RECTAL | Status: DC | PRN

## 2019-02-22 MED ORDER — SENNA 8.6 MG PO TABS
1.0000 | ORAL_TABLET | Freq: Two times a day (BID) | ORAL | Status: DC
  Administered 2019-02-23 – 2019-03-05 (×21): 8.6 mg via ORAL
  Filled 2019-02-22 (×22): qty 1

## 2019-02-22 MED ORDER — GABAPENTIN 100 MG PO CAPS
100.0000 mg | ORAL_CAPSULE | Freq: Three times a day (TID) | ORAL | Status: DC
  Administered 2019-02-23 – 2019-03-05 (×32): 100 mg via ORAL
  Filled 2019-02-22 (×32): qty 1

## 2019-02-22 MED ORDER — ASCORBIC ACID 500 MG PO TABS
500.0000 mg | ORAL_TABLET | Freq: Two times a day (BID) | ORAL | Status: DC
  Administered 2019-02-23 – 2019-03-05 (×21): 500 mg via ORAL
  Filled 2019-02-22 (×20): qty 1

## 2019-02-22 MED ORDER — TRIAMCINOLONE ACETONIDE 0.1 % EX CREA
TOPICAL_CREAM | Freq: Two times a day (BID) | CUTANEOUS | Status: DC
  Filled 2019-02-22 (×3): qty 15

## 2019-02-22 MED ORDER — SORBITOL 70 % SOLN
30.0000 mL | Freq: Every day | Status: DC | PRN
  Filled 2019-02-22: qty 30

## 2019-02-22 MED ORDER — FLUTICASONE PROPIONATE 50 MCG/ACT NA SUSP
2.0000 | Freq: Every day | NASAL | Status: DC
  Administered 2019-02-23 – 2019-03-05 (×11): 2 via NASAL
  Filled 2019-02-22: qty 16

## 2019-02-22 MED ORDER — ACETAMINOPHEN 325 MG PO TABS
650.0000 mg | ORAL_TABLET | Freq: Four times a day (QID) | ORAL | Status: DC | PRN
  Administered 2019-02-22: 650 mg via ORAL
  Filled 2019-02-22: qty 2

## 2019-02-22 MED ORDER — SODIUM CHLORIDE 0.9 % IV SOLN: 250.0000 mL | INTRAVENOUS | Status: DC | PRN

## 2019-02-22 MED ORDER — AMLODIPINE BESYLATE 5 MG PO TABS
5.0000 mg | ORAL_TABLET | Freq: Every day | ORAL | Status: DC
  Administered 2019-02-23 – 2019-02-28 (×6): 5 mg via ORAL
  Filled 2019-02-22 (×6): qty 1

## 2019-02-22 MED ORDER — METHYLPREDNISOLONE SODIUM SUCC 40 MG IJ SOLR: 40.0000 mg | Freq: Two times a day (BID) | INTRAMUSCULAR | Status: DC

## 2019-02-22 MED ORDER — ONDANSETRON HCL 4 MG PO TABS
4.0000 mg | ORAL_TABLET | Freq: Four times a day (QID) | ORAL | Status: DC | PRN
  Filled 2019-02-22: qty 1

## 2019-02-22 MED ORDER — ACETAMINOPHEN 650 MG RE SUPP: 650.0000 mg | Freq: Four times a day (QID) | RECTAL | Status: DC | PRN

## 2019-02-22 MED ORDER — ZINC SULFATE 220 (50 ZN) MG PO CAPS
220.0000 mg | ORAL_CAPSULE | Freq: Every day | ORAL | Status: DC
  Administered 2019-02-23 – 2019-03-05 (×11): 220 mg via ORAL
  Filled 2019-02-22 (×12): qty 1

## 2019-02-22 MED ORDER — PANTOPRAZOLE SODIUM 40 MG PO TBEC
40.0000 mg | DELAYED_RELEASE_TABLET | Freq: Every day | ORAL | Status: DC
  Administered 2019-02-23 – 2019-03-05 (×11): 40 mg via ORAL
  Filled 2019-02-22 (×10): qty 1

## 2019-02-22 MED ORDER — SODIUM CHLORIDE 0.9% FLUSH: 3.0000 mL | INTRAVENOUS | Status: DC | PRN

## 2019-02-22 MED ORDER — LORATADINE 10 MG PO TABS
10.0000 mg | ORAL_TABLET | Freq: Every day | ORAL | Status: DC
  Administered 2019-02-23 – 2019-03-05 (×11): 10 mg via ORAL
  Filled 2019-02-22 (×11): qty 1

## 2019-02-22 MED ORDER — SODIUM CHLORIDE 0.9% FLUSH
3.0000 mL | Freq: Two times a day (BID) | INTRAVENOUS | Status: DC
  Administered 2019-02-23 – 2019-03-04 (×21): 3 mL via INTRAVENOUS

## 2019-02-22 MED ORDER — ALBUTEROL SULFATE HFA 108 (90 BASE) MCG/ACT IN AERS
2.0000 | INHALATION_SPRAY | RESPIRATORY_TRACT | Status: DC | PRN
  Administered 2019-02-22 – 2019-02-26 (×3): 2 via RESPIRATORY_TRACT
  Filled 2019-02-22: qty 6.7

## 2019-02-22 MED ORDER — ENOXAPARIN SODIUM 40 MG/0.4ML ~~LOC~~ SOLN
40.0000 mg | SUBCUTANEOUS | Status: DC
  Administered 2019-02-23 – 2019-02-25 (×3): 40 mg via SUBCUTANEOUS
  Filled 2019-02-22 (×3): qty 0.4

## 2019-02-22 MED ORDER — ONDANSETRON HCL 4 MG/2ML IJ SOLN: 4.0000 mg | Freq: Four times a day (QID) | INTRAMUSCULAR | Status: DC | PRN

## 2019-02-22 MED ORDER — SODIUM CHLORIDE 0.9% FLUSH
3.0000 mL | Freq: Once | INTRAVENOUS | Status: AC
  Administered 2019-02-25: 3 mL via INTRAVENOUS

## 2019-02-22 MED ORDER — ACETAMINOPHEN-CODEINE #3 300-30 MG PO TABS
1.0000 | ORAL_TABLET | Freq: Four times a day (QID) | ORAL | Status: DC | PRN
  Administered 2019-03-02: 22:00:00 1 via ORAL
  Filled 2019-02-22: qty 1

## 2019-02-22 MED ORDER — SODIUM CHLORIDE 0.9% FLUSH
3.0000 mL | Freq: Two times a day (BID) | INTRAVENOUS | Status: DC
  Administered 2019-02-23 – 2019-02-25 (×6): 3 mL via INTRAVENOUS

## 2019-02-22 MED ORDER — GUAIFENESIN ER 600 MG PO TB12
1200.0000 mg | ORAL_TABLET | Freq: Two times a day (BID) | ORAL | Status: DC
  Administered 2019-02-23 – 2019-03-05 (×22): 1200 mg via ORAL
  Filled 2019-02-22 (×22): qty 2

## 2019-02-22 MED ORDER — POLYETHYLENE GLYCOL 3350 17 G PO PACK
17.0000 g | PACK | Freq: Every day | ORAL | Status: DC | PRN
  Administered 2019-02-24: 17 g via ORAL
  Filled 2019-02-22: qty 1

## 2019-02-22 MED ORDER — ROSUVASTATIN CALCIUM 5 MG PO TABS
10.0000 mg | ORAL_TABLET | Freq: Every day | ORAL | Status: DC
  Administered 2019-02-23 – 2019-03-05 (×11): 10 mg via ORAL
  Filled 2019-02-22 (×12): qty 2

## 2019-02-22 MED ORDER — DICLOFENAC SODIUM 1 % EX GEL: 4.0000 g | Freq: Four times a day (QID) | CUTANEOUS | Status: DC | PRN

## 2019-02-22 MED ORDER — METHYLPREDNISOLONE SODIUM SUCC 125 MG IJ SOLR
60.0000 mg | Freq: Two times a day (BID) | INTRAMUSCULAR | Status: DC
  Administered 2019-02-22 – 2019-03-01 (×13): 60 mg via INTRAVENOUS
  Filled 2019-02-22 (×14): qty 2

## 2019-02-22 NOTE — ED Notes (Signed)
Messaged [facility] floor coverage to alert them that pt is now [employer].

## 2019-02-22 NOTE — Progress Notes (Signed)
Patient has been called and DOB has been verified. Patient has been screened and transferred to PCP to start phone visit.   Pain over entire body. Coughing and headaches.

## 2019-02-22 NOTE — ED Notes (Signed)
I attempted two times to get an IV in pt without success. I had another nurse attempt to get blood on pt and he tried three times without success. I placed order for IV team consult

## 2019-02-22 NOTE — ED Provider Notes (Signed)
Patient is a difficult stick. I placed [place] IV to obtain labs. Patient is getting admitted for hypoxia and possible pulmonary edema. COVID turned out positive. COVID preadmission labs showed elevated inflammatory markers in including D-dimer. Doing well on 2 L [place]. Nurse to page hospitalist to notify the COVID result  Angiocath insertion Performed by: Richardean Canal  Consent: Verbal consent obtained. Risks and benefits: risks, benefits and alternatives were discussed Time out: Immediately prior to procedure a "time out" was called to verify the correct patient, procedure, equipment, support staff and site/side marked as required.  Preparation: Patient was prepped and draped in the usual sterile fashion.  Vein Location: L antecube  Ultrasound Guided  Gauge: 20 long   Normal blood return and flush without difficulty Patient tolerance: Patient tolerated the procedure well with no immediate complications.      Charlynne Pander, MD 02/22/19 2258

## 2019-02-22 NOTE — ED Notes (Signed)
Assisted pt on and off bed pan

## 2019-02-22 NOTE — ED Triage Notes (Signed)
Pt being sent by PCP for eval of chest pain, weakness in her knees and cough. She tested negative for covid 02/21/2019. Pt speaks french and [place] close friend at bedside for interpretation .

## 2019-02-22 NOTE — ED Notes (Signed)
[phone] call daughter elier Katrinka Blazing

## 2019-02-22 NOTE — Progress Notes (Signed)
   Virtual Visit via Telephone Note  I connected with [name] [name] on 02/22/19 at  9:50 AM EST by telephone and verified that I am speaking with the correct person using two identifiers.   I discussed the limitations, risks, security and privacy concerns of performing an evaluation and management service by telephone and the availability of in person appointments. I also discussed with the patient that there may be a patient responsible charge related to this service. The patient expressed understanding and agreed to proceed.  Patient Location: Home Provider Location: [facility] Others participating in call: call initiated by Harle Stanford, CMA who obtained interpreter and patient's housemate, Joemle also on call   History of Present Illness:      69 yo female with Hypertension, stage 2 CKD, chronic knee pain and difficulty with ambulation secondary to endstage knee OA, who has been sick for about 7 days and symptoms are worsening per her housemate. Patient has had negative COVID test earlier this week after developing onset of cough and bodyaches but now with extreme weakness, SOB and she feels as if it is difficult to breathe. No documented fever but has felt feverish. Patient also with decreased appetite and nausea but does not actually vomit but feels as if she will do so.   Past Medical History:  Diagnosis Date  . Hypertension     Past Surgical History:  Procedure Laterality Date  . NO PAST SURGERIES      Family History  Family history unknown: Yes    Social History   Tobacco Use  . Smoking status: Never Smoker  . Smokeless tobacco: Never Used  Substance Use Topics  . Alcohol use: Never  . Drug use: Never     No Known Allergies     Observations/Objective: No vital signs or physical exam conducted as visit was done via telephone  Assessment and Plan: 1. Shortness of breath 2. Weakness 3. Systemic viral illness Patient likely with viral illness and  advised house-mate that patient needs to go to the ED for further evaluation of her cough, SOB, sensation of difficulty breathing and weakness. If there is no one who can take patient to the ED then 911/EMS should be called.  Follow Up Instructions:Return for illness- follow-up after ED.    I discussed the assessment and treatment plan with the patient. The patient was provided an opportunity to ask questions and all were answered. The patient agreed with the plan and demonstrated an understanding of the instructions.   The patient was advised to call back or seek an in-person evaluation if the symptoms worsen or if the condition fails to improve as anticipated.  I provided 12 minutes of non-face-to-face time during this encounter.   Cain Saupe, MD

## 2019-02-22 NOTE — H&P (Signed)
History and Physical    [name] [name] ENI:[id number] DOB: [birth year] DOA: 02/22/2019  PCP: Cain Saupe, MD  Patient coming from: Home  I have personally briefly reviewed patient's old medical records in [facility]  Chief Complaint: Cough, weakness  HPI: [name] [name] is a 69 y.o. adult with medical history significant of hypertension, chronic knee pain,, chronic kidney disease stage II(per PCPs note), presenting today ED with a 7-day history of worsening symptoms of productive cough of yellowish sputum, chest discomfort associated with coughing, myalgias, headache, intermittent chills, generalized weakness, decreased appetite, decreased oral intake, worsening shortness of breath.  Patient denies any documented fevers.  Patient denies any nausea, no vomiting, no abdominal pain, no melena, no hematemesis, no hematochezia, no syncopal episodes, no focal neurological deficits.  Patient underwent a virtual visit via telephone note with PCP on the day of admission and due to patient symptoms was subsequently sent to the ED.  Patient did state was recently tested for Covid however has not received results yet. Patient recently emigrated from [place] of [place] nearly 2 years ago and primarily speaks [place] and also some [name].  Per ED PA close friend interpreted for ED PA however at my visit patient's friend had left and as such history obtained via interpreter via iPad. Per ED PA patient noted to have sats of 88% on room air and as such patient subsequently placed on 2 L nasal cannula O2 and due to concerns for hypoxia hospitalists were called to admit the patient.  ED Course: Patient seen in the ED metabolic profile obtained unremarkable.  High-sensitivity troponin was 18.  Obtained unremarkable.  BNP pending.  SARS COVID-19 pending.  Chest x-ray done with bilateral airspace disease favoring CHF and edema over pneumonia.  BNP pending.  Hospitalist were called to admit  the patient for further evaluation and management.  Review of Systems: As per HPI otherwise 10 point review of systems negative.   Past Medical History:  Diagnosis Date  . Hypertension     Past Surgical History:  Procedure Laterality Date  . NO PAST SURGERIES       reports that she has never smoked. She has never used smokeless tobacco. She reports that she does not drink alcohol or use drugs.  No Known Allergies  Family History  Family history unknown: Yes   Family history reviewed and patient's parents are both deceased.  Prior to Admission medications   Medication Sig Start Date End Date Taking? Authorizing Provider  acetaminophen (TYLENOL) 500 MG tablet Take 1,000 mg by mouth every 6 (six) hours as needed for fever.    [provider]  acetaminophen-codeine (TYLENOL #3) 300-30 MG tablet Take 1 tablet by mouth every 6 (six) hours as needed for moderate pain. 08/08/18   Fulp, Cammie, MD  amLODipine (NORVASC) 5 MG tablet Take 1 tablet (5 mg total) by mouth daily. To lower blood pressure 09/26/18   Fulp, Cammie, MD  Calcium Carbonate (CALCIUM 600 PO) Take 1,200 mg by mouth 2 (two) times daily.     [provider]  diclofenac sodium (VOLTAREN) 1 % GEL Apply 4 g topically 4 (four) times daily. Apply to feet 4 times daily PRN for pain. 09/26/18   Fulp, Cammie, MD  docusate sodium (COLACE) 100 MG capsule Take one or two pills at bedtime to help with constipaiton 09/26/18   Fulp, Cammie, MD  gabapentin (NEURONTIN) 100 MG capsule Take 1 capsule (100 mg total) by mouth 3 (three) times daily. 08/08/18  Fulp, Cammie, MD  mupirocin ointment (BACTROBAN) 2 % Apply bid to wound 09/26/18   Fulp, Cammie, MD  predniSONE (DELTASONE) 20 MG tablet 2 pills once per day for 5 days; take after eating 08/08/18   Fulp, Cammie, MD  rosuvastatin (CRESTOR) 10 MG tablet Take 1 tablet (10 mg total) by mouth daily. To lower cholesterol 08/09/18   Fulp, Cammie, MD  triamcinolone cream (KENALOG) 0.1 %  Apply to dry skin 1-2 times daily sparingly 09/26/18   Cain Saupe, MD    Physical Exam: Vitals:   02/22/19 1616 02/22/19 1630 02/22/19 1700 02/22/19 1730  BP:  (!) 165/94 (!) 143/84 (!) 148/87  Pulse: 67     Resp: 16 (!) 25 (!) 24 (!) 23  Temp:      TempSrc:      SpO2: 94%       Constitutional: NAD, calm, comfortable Vitals:   02/22/19 1616 02/22/19 1630 02/22/19 1700 02/22/19 1730  BP:  (!) 165/94 (!) 143/84 (!) 148/87  Pulse: 67     Resp: 16 (!) 25 (!) 24 (!) 23  Temp:      TempSrc:      SpO2: 94%      Eyes: PERRL, lids and conjunctivae normal ENMT: Mucous membranes are dry. Posterior pharynx clear of any exudate or lesions.Normal dentition.  Neck: normal, supple, no masses, no thyromegaly Respiratory: Coarse bilateral breath sounds noted.  No wheezing.  No crackles.  Speaking in full sentences.  No use of accessory muscles of respiration.  Cardiovascular: Regular rate and rhythm, no murmurs / rubs / gallops. No extremity edema. 2+ pedal pulses. No carotid bruits.  Abdomen: no tenderness, no masses palpated. No hepatosplenomegaly. Bowel sounds positive.  Musculoskeletal: no clubbing / cyanosis. No joint deformity upper and lower extremities. Good ROM, no contractures. Normal muscle tone.  Skin: no rashes, lesions, ulcers. No induration Neurologic: CN 2-12 grossly intact. Sensation intact, DTR normal. Strength 5/5 in all 4.  Psychiatric: Normal judgment and insight. Alert and oriented x 3. Normal mood.   Labs on Admission: I have personally reviewed following labs and imaging studies  CBC: Recent Labs  Lab 02/22/19 1400  WBC 5.3  HGB 13.2  HCT 39.7  MCV 80.5  PLT 205   Basic Metabolic Panel: Recent Labs  Lab 02/22/19 1400  NA 141  K 3.5  CL 102  CO2 26  GLUCOSE 94  BUN 8  CREATININE 1.04*  CALCIUM 8.7*   GFR: CrCl cannot be calculated (Unknown ideal weight.). Liver Function Tests: No results for input(s): AST, ALT, ALKPHOS, BILITOT, PROT, ALBUMIN in  the last 168 hours. No results for input(s): LIPASE, AMYLASE in the last 168 hours. No results for input(s): AMMONIA in the last 168 hours. Coagulation Profile: No results for input(s): INR, PROTIME in the last 168 hours. Cardiac Enzymes: No results for input(s): CKTOTAL, CKMB, CKMBINDEX, TROPONINI in the last 168 hours. BNP (last 3 results) No results for input(s): PROBNP in the last 8760 hours. HbA1C: No results for input(s): HGBA1C in the last 72 hours. CBG: No results for input(s): GLUCAP in the last 168 hours. Lipid Profile: Recent Labs    02/22/19 1703  TRIG 102   Thyroid Function Tests: No results for input(s): TSH, T4TOTAL, FREET4, T3FREE, THYROIDAB in the last 72 hours. Anemia Panel: Recent Labs    02/22/19 1703  FERRITIN 51   Urine analysis:    Component Value Date/Time   COLORURINE STRAW (A) 07/17/2018 1450   APPEARANCEUR CLEAR 07/17/2018 1450  LABSPEC 1.005 07/17/2018 1450   PHURINE 7.0 07/17/2018 1450   GLUCOSEU NEGATIVE 07/17/2018 1450   HGBUR SMALL (A) 07/17/2018 1450   BILIRUBINUR NEGATIVE 07/17/2018 1450   KETONESUR NEGATIVE 07/17/2018 1450   PROTEINUR 30 (A) 07/17/2018 1450   NITRITE NEGATIVE 07/17/2018 1450   LEUKOCYTESUR NEGATIVE 07/17/2018 1450    Radiological Exams on Admission: DG Chest 2 View  Result Date: 02/22/2019 CLINICAL DATA:  Chest pain EXAM: CHEST - 2 VIEW COMPARISON:  07/17/2018 FINDINGS: Cardiac enlargement with vascular congestion. Bibasilar atelectasis/infiltrate. Probable small pleural effusions. IMPRESSION: Bilateral airspace disease. Favor congestive heart failure and edema over pneumonia. Electronically Signed   By: Franchot Gallo M.D.   On: 02/22/2019 14:54    EKG: Independently reviewed.  T wave inversion and slight ST depression in leads II, III, aVF.  Assessment/Plan Principal Problem:   Acute respiratory failure with hypoxia (HCC) Active Problems:   Flu-like symptoms   Hypertension   Knee osteoarthritis    Essential hypertension, benign   1 acute respiratory failure with hypoxia/flulike symptoms Patient presenting with 1 week history of flulike symptoms including intermittent chills, productive cough of yellowish sputum, worsening shortness of breath, myalgias, extreme weakness, and some periods of difficulty breathing.  Patient noted to have had a COVID-19 test done recently however per patient does not have results back yet.  Chest x-ray done concerning for bilateral airspace disease with concerns for possible heart failure however patient clinically does not look volume overloaded and may be more on the dry side.  SARS COVID-19 pending.  We will add an influenza A and B PCR.  Check a D-dimer, CRP, procalcitonin, hepatic panel.  Ferritin noted to be at 51.  Concern for probable COVID-19 infection.  Will place patient on Solu-Medrol 60 mg IV every 12 hours, Combivent inhaler 4 times daily, Mucinex, Flonase, Claritin, Protonix, vitamin C, zinc, albuterol MDI as needed.  If COVID-19 PCR is positive will place on IV remdesivir.  2.  Hypertension Resume home regimen Norvasc.  3.  Minimally elevated troponin Cycle cardiac enzymes.  If cardiac enzymes are rising and increasing we will check a 2D echo however will hold off for now.   DVT prophylaxis: Lovenox Code Status: Full Family Communication: Updated patient via interpreter.  No family at bedside. Disposition Plan: Likely home when clinically improved and close to baseline. Consults called: None Admission status: Admit to inpatient.   Irine Seal MD [facility]  If 7PM-7AM, please contact night-coverage [email or web address]  02/22/2019, 6:27 PM

## 2019-02-22 NOTE — ED Provider Notes (Signed)
[place] EMERGENCY DEPARTMENT Provider Note   CSN: [id number] Arrival date & time: 02/22/19  1321     History Chief Complaint  Patient presents with  . Cough  . Weakness  . Chest Pain    [name] Analy [name] is a 69 y.o. adult female with PMH of HTN on amlodipine and osteoarthritis presents to the ED with complaints of cough, diminished appetite, and intermittent chest discomfort.  Patient emigrated from the [place] of [place] nearly 2 years ago and primarily speaks [place].  She also speaks [place] and her close friend who accompanied her to the ER functioned as her interpreter.  Patient is endorsing a 6 to 7-day history of cough productive of yellow sputum, chest discomfort worse with coughing, intermittent headache, and intermittent chills.  She also endorses diminished appetite and weakness due to her poor p.o. intake.  She is still able to ambulate without difficulty and is in no acute distress.  She denies any significant chest pain, difficulty breathing, fevers, dizziness, blurred vision or other neurologic deficits, abdominal pain, nausea or vomiting, urinary symptoms, melena, or hematochezia.  She obtain COVID-19 testing yesterday which she reports was negative.  She was initially taking a cold and flu medication however was instructed to discontinue due to her high blood pressure.   HPI     Past Medical History:  Diagnosis Date  . Hypertension     Patient Active Problem List   Diagnosis Date Noted  . Essential hypertension, benign 05/03/2018  . Hypertension 10/24/2017  . Knee osteoarthritis 10/24/2017    Past Surgical History:  Procedure Laterality Date  . NO PAST SURGERIES       OB History   No obstetric history on file.     Family History  Family history unknown: Yes    Social History   Tobacco Use  . Smoking status: Never Smoker  . Smokeless tobacco: Never Used  Substance Use Topics  . Alcohol use: Never  . Drug use:  Never    Home Medications Prior to Admission medications   Medication Sig Start Date End Date Taking? Authorizing Provider  acetaminophen (TYLENOL) 500 MG tablet Take 1,000 mg by mouth every 6 (six) hours as needed for fever.    [provider]  acetaminophen-codeine (TYLENOL #3) 300-30 MG tablet Take 1 tablet by mouth every 6 (six) hours as needed for moderate pain. 08/08/18   Fulp, Cammie, MD  amLODipine (NORVASC) 5 MG tablet Take 1 tablet (5 mg total) by mouth daily. To lower blood pressure 09/26/18   Fulp, Cammie, MD  Calcium Carbonate (CALCIUM 600 PO) Take 1,200 mg by mouth 2 (two) times daily.     [provider]  diclofenac sodium (VOLTAREN) 1 % GEL Apply 4 g topically 4 (four) times daily. Apply to feet 4 times daily PRN for pain. 09/26/18   Fulp, Cammie, MD  docusate sodium (COLACE) 100 MG capsule Take one or two pills at bedtime to help with constipaiton 09/26/18   Fulp, Cammie, MD  gabapentin (NEURONTIN) 100 MG capsule Take 1 capsule (100 mg total) by mouth 3 (three) times daily. 08/08/18   Fulp, Cammie, MD  mupirocin ointment (BACTROBAN) 2 % Apply bid to wound 09/26/18   Fulp, Cammie, MD  predniSONE (DELTASONE) 20 MG tablet 2 pills once per day for 5 days; take after eating 08/08/18   Fulp, Cammie, MD  rosuvastatin (CRESTOR) 10 MG tablet Take 1 tablet (10 mg total) by mouth daily. To lower cholesterol 08/09/18  Fulp, Cammie, MD  triamcinolone cream (KENALOG) 0.1 % Apply to dry skin 1-2 times daily sparingly 09/26/18   Fulp, Cammie, MD    Allergies    Patient has no known allergies.  Review of Systems   Review of Systems  All other systems reviewed and are negative.   Physical Exam Updated Vital Signs BP (!) 143/84   Pulse 67   Temp 98.3 F (36.8 C) (Oral)   Resp (!) 24   SpO2 94%   Physical Exam Vitals and nursing note reviewed. Exam conducted with a chaperone present.  Constitutional:      Appearance: Normal appearance.  HENT:     Head: Normocephalic and  atraumatic.  Eyes:     General: No scleral icterus.    Conjunctiva/sclera: Conjunctivae normal.  Cardiovascular:     Rate and Rhythm: Normal rate and regular rhythm.     Pulses: Normal pulses.     Heart sounds: Normal heart sounds.  Pulmonary:     Comments: No significant increased work of breathing.  No accessory muscle use.  Breath sounds intact bilaterally. Abdominal:     General: Abdomen is flat. There is no distension.     Palpations: Abdomen is soft. There is no mass.     Tenderness: There is no abdominal tenderness. There is no guarding.  Musculoskeletal:     Cervical back: Normal range of motion and neck supple. No rigidity.  Skin:    General: Skin is dry.     Capillary Refill: Capillary refill takes less than 2 seconds.  Neurological:     Mental Status: She is alert and oriented to person, place, and time.     GCS: GCS eye subscore is 4. GCS verbal subscore is 5. GCS motor subscore is 6.  Psychiatric:        Mood and Affect: Mood normal.        Behavior: Behavior normal.        Thought Content: Thought content normal.     ED Results / Procedures / Treatments   Labs (all labs ordered are listed, but only abnormal results are displayed) Labs Reviewed  BASIC METABOLIC PANEL - Abnormal; Notable for the following components:      Result Value   Creatinine, Ser 1.04 (*)    Calcium 8.7 (*)    GFR calc non Af Amer 54 (*)    All other components within normal limits  CBC - Abnormal; Notable for the following components:   RDW 16.6 (*)    All other components within normal limits  TROPONIN I (HIGH SENSITIVITY) - Abnormal; Notable for the following components:   Troponin I (High Sensitivity) 18 (*)    All other components within normal limits  SARS CORONAVIRUS 2 (TAT 6-24 HRS)  URINE CULTURE  CULTURE, BLOOD (ROUTINE X 2)  CULTURE, BLOOD (ROUTINE X 2)  BRAIN NATRIURETIC PEPTIDE  INFLUENZA PANEL BY PCR (TYPE A & B)  URINALYSIS, ROUTINE W REFLEX MICROSCOPIC  C-REACTIVE  PROTEIN  D-DIMER, QUANTITATIVE (NOT AT [facility])  FERRITIN  PROCALCITONIN  LACTIC ACID, PLASMA  LACTIC ACID, PLASMA  LACTATE DEHYDROGENASE  TRIGLYCERIDES  FIBRINOGEN  TROPONIN I (HIGH SENSITIVITY)    EKG EKG Interpretation  Date/Time:  Friday February 22 2019 13:32:52 EST Ventricular Rate:  79 PR Interval:  102 QRS Duration: 100 QT Interval:  442 QTC Calculation: 506 R Axis:   -124 Text Interpretation: Sinus rhythm with short PR Right superior axis deviation Septal infarct , age undetermined T wave abnormality Abnormal  ECG Confirmed by Gerhard Munch [id number]) on 02/22/2019 1:57:55 PM   Radiology DG Chest 2 View  Result Date: 02/22/2019 CLINICAL DATA:  Chest pain EXAM: CHEST - 2 VIEW COMPARISON:  07/17/2018 FINDINGS: Cardiac enlargement with vascular congestion. Bibasilar atelectasis/infiltrate. Probable small pleural effusions. IMPRESSION: Bilateral airspace disease. Favor congestive heart failure and edema over pneumonia. Electronically Signed   By: Marlan Palau M.D.   On: 02/22/2019 14:54    Procedures Procedures (including critical care time)  Medications Ordered in ED Medications  sodium chloride flush (NS) 0.9 % injection 3 mL (has no administration in time range)    ED Course  I have reviewed the triage vital signs and the nursing notes.  Pertinent labs & imaging results that were available during my care of the patient were reviewed by me and considered in my medical decision making (see chart for details).    MDM Rules/Calculators/A&P                      DG chest demonstrated bilateral airspace disease, suspicious for heart failure and edema versus pneumonia.  BMP and CBC demonstrated no concerning abnormalities.  Troponin mildly elevated to 18, will trend.  Patient's friend reports that patient has gained weight and endorsed bilateral leg swelling, however on my exam there is no significant bilateral edema.  Patient does however appear to be dehydrated which is  consistent with her report of diminished fluids and p.o. intake.  If discharged, she will need to follow-up with cardiology and possibly require echocardiogram to evaluate for congestive heart failure.  While she is in no acute distress and I was planning for discharge home, her oxygen saturation dropped to 88% while on RA.  Will place on 2 L supplemental oxygen and contact unassigned medicine for admission due to new onset oxygen requirements.  Discussed case with Dr. Jeraldine Loots who voices understanding and is agreeable to plan.    Spoke with Dr. Janee Morn who will evaluate patient.  Discussed case with Harlene Salts, PA-C at shift change who will also follow patient.   Final Clinical Impression(s) / ED Diagnoses Final diagnoses:  Hypoxia  Cough    Rx / DC Orders ED Discharge Orders    None       Lorelee New, PA-C 02/22/19 1712    Gerhard Munch, MD 02/24/19 [phone]

## 2019-02-22 NOTE — ED Notes (Signed)
PureWick placed on pt. 

## 2019-02-23 LAB — COMPREHENSIVE METABOLIC PANEL
ALT: 13 U/L (ref 0–44)
AST: 18 U/L (ref 15–41)
Albumin: 2.6 g/dL — ABNORMAL LOW (ref 3.5–5.0)
Alkaline Phosphatase: 47 U/L (ref 38–126)
Anion gap: 11 (ref 5–15)
BUN: 9 mg/dL (ref 8–23)
CO2: 25 mmol/L (ref 22–32)
Calcium: 8.6 mg/dL — ABNORMAL LOW (ref 8.9–10.3)
Chloride: 102 mmol/L (ref 98–111)
Creatinine, Ser: 1.03 mg/dL — ABNORMAL HIGH (ref 0.44–1.00)
GFR calc Af Amer: >60 mL/min (ref >60)
GFR calc non Af Amer: 55 mL/min — ABNORMAL LOW (ref >60)
Glucose, Bld: 188 mg/dL — ABNORMAL HIGH (ref 70–99)
Potassium: 3.8 mmol/L (ref 3.5–5.1)
Sodium: 138 mmol/L (ref 135–145)
Total Bilirubin: 0.7 mg/dL (ref 0.3–1.2)
Total Protein: 6.9 g/dL (ref 6.5–8.1)

## 2019-02-23 LAB — TROPONIN I (HIGH SENSITIVITY): Troponin I (High Sensitivity): 15 ng/L (ref <18)

## 2019-02-23 LAB — CBC
HCT: 37 % (ref 36.0–46.0)
Hemoglobin: 12.1 g/dL (ref 12.0–15.0)
MCH: 26.1 pg (ref 26.0–34.0)
MCHC: 32.7 g/dL (ref 30.0–36.0)
MCV: 79.9 fL — ABNORMAL LOW (ref 80.0–100.0)
Platelets: 201 10*3/uL (ref 150–400)
RBC: 4.63 MIL/uL (ref 3.87–5.11)
RDW: 16.7 % — ABNORMAL HIGH (ref 11.5–15.5)
WBC: 6.3 10*3/uL (ref 4.0–10.5)
nRBC: 0 % (ref 0.0–0.2)

## 2019-02-23 LAB — GLUCOSE, CAPILLARY: Glucose-Capillary: 178 mg/dL — ABNORMAL HIGH (ref 70–99)

## 2019-02-23 LAB — FERRITIN: Ferritin: 50 ng/mL (ref 11–307)

## 2019-02-23 LAB — C-REACTIVE PROTEIN: CRP: 3.3 mg/dL — ABNORMAL HIGH (ref <1.0)

## 2019-02-23 LAB — PROCALCITONIN: Procalcitonin: <0.1 ng/mL

## 2019-02-23 LAB — D-DIMER, QUANTITATIVE: D-Dimer, Quant: 2.77 ug/mL-FEU — ABNORMAL HIGH (ref 0.00–0.50)

## 2019-02-23 MED ORDER — IPRATROPIUM-ALBUTEROL 20-100 MCG/ACT IN AERS
2.0000 | INHALATION_SPRAY | Freq: Three times a day (TID) | RESPIRATORY_TRACT | Status: DC
  Administered 2019-02-23 – 2019-02-26 (×9): 2 via RESPIRATORY_TRACT
  Filled 2019-02-23: qty 4

## 2019-02-23 MED ORDER — FUROSEMIDE 10 MG/ML IJ SOLN
40.0000 mg | Freq: Once | INTRAMUSCULAR | Status: AC
  Administered 2019-02-23: 12:00:00 40 mg via INTRAVENOUS
  Filled 2019-02-23: qty 4

## 2019-02-23 MED ORDER — ORAL CARE MOUTH RINSE
15.0000 mL | Freq: Two times a day (BID) | OROMUCOSAL | Status: DC
  Administered 2019-02-23 – 2019-03-05 (×15): 15 mL via OROMUCOSAL

## 2019-02-23 MED ORDER — SODIUM CHLORIDE 0.9 % IV SOLN
200.0000 mg | Freq: Once | INTRAVENOUS | Status: AC
  Administered 2019-02-23: 200 mg via INTRAVENOUS
  Filled 2019-02-23: qty 40

## 2019-02-23 MED ORDER — SODIUM CHLORIDE 0.9 % IV SOLN
100.0000 mg | Freq: Every day | INTRAVENOUS | Status: AC
  Administered 2019-02-24 – 2019-02-27 (×4): 100 mg via INTRAVENOUS
  Filled 2019-02-23 (×4): qty 20

## 2019-02-23 MED ORDER — POTASSIUM CHLORIDE CRYS ER 20 MEQ PO TBCR
40.0000 meq | EXTENDED_RELEASE_TABLET | Freq: Once | ORAL | Status: AC
  Administered 2019-02-23: 40 meq via ORAL
  Filled 2019-02-23: qty 2

## 2019-02-23 NOTE — ED Notes (Signed)
Used [name] audio interpreter to tell pt the medications she is getting, that she has tested positive for Covid 19 and will be staying in the hospital.

## 2019-02-23 NOTE — Progress Notes (Signed)
PROGRESS NOTE    [name] [name]  YPP:[id number] DOB: [birth year] DOA: 02/22/2019 PCP: Cain Saupe, MD   Brief Narrative:  69 year old with history of HTN, chronic knee pain, CKD stage II came to the ER with complaints of worsening shortness of breath and cough for 7 days.  She called her PCP who sent her to the hospital for further evaluation.  Hypoxia down to 88% on room air in the ER per admitting provider.  Chest x-ray showing bilateral airspace infiltrate therefore admitted to the hospital.   Assessment & Plan:   Principal Problem:   Acute respiratory failure with hypoxia (HCC) Active Problems:   Hypertension   Knee osteoarthritis   Essential hypertension, benign   Flu-like symptoms   COVID-19  Acute hypoxic respiratory failure, 2 L nasal cannula COVID-19 pneumonia -Oxygen levels-2 L nasal cannula -Remdesivir-day 1 -Solumedrol-D2 -Routine: Labs have been reviewed including ferritin, LDH, CRP, d-dimer, fibrinogen.  Will need to trend this lab daily. -Vitamin C & Zinc. Prone >16hrs/day.  -procalcitonin-negative, BNP 190 -Chest x-ray-bilateral infiltrate -Supportive care-antitussive, inhalers, I-S/flutter -CODE STATUS confirmed -Lasix 40 mg IV  Essential hypertension -Norvasc 5 mg daily  CKD stage II -Stable.  Peripheral neuropathy -Gabapentin 3 times daily  Hyperlipidemia -Crestor  Bilateral knee osteoarthritis -Pain control.  Translator used from [employer] ID # [id number]  DVT prophylaxis: Lovenox Code Status: Full code Family Communication: None at bedside Disposition Plan: Maintain hospital stay, she is still on 2 L nasal cannula.  At baseline does not use any oxygen  Subjective: Seen and examined at bedside.  Used interpreter for [employer], ID as above  Patient states she feels overall better than compared to how she has been feeling last 10 days.  She still having exertional dyspnea but much better overall.  Review of  Systems Otherwise negative except as per HPI, including: General = no fevers, chills, dizziness,  fatigue HEENT/EYES = negative for loss of vision, double vision, blurred vision,  sore throa Cardiovascular= negative for chest pain, palpitation Respiratory/lungs= negative for  hemoptysis,  Gastrointestinal= negative for  abdominal pain Genitourinary= negative for Dysuria MSK = Negative for arthralgia, myalgias Neurology= Negative for headache, numbness, tingling  Psychiatry= Negative for suicidal and homocidal ideation Skin= Negative for Rash   Objective: Vitals:   02/23/19 0001 02/23/19 0015 02/23/19 0100 02/23/19 0619  BP: 124/65   121/88  Pulse:  76  69  Resp:  13  11  Temp:    97.8 F (36.6 C)  TempSrc:    Oral  SpO2:  96% 95% 91%    Intake/Output Summary (Last 24 hours) at 02/23/2019 0816 Last data filed at 02/23/2019 0200 Gross per 24 hour  Intake 203 ml  Output --  Net 203 ml   There were no vitals filed for this visit.  Examination:  General exam: Appears calm and comfortable, 2 L nasal cannula Respiratory system: Bilateral rhonchi at the bases Cardiovascular system: S1 & S2 heard, RRR. No JVD, murmurs, rubs, gallops or clicks. No pedal edema. Gastrointestinal system: Abdomen is nondistended, soft and nontender. No organomegaly or masses felt. Normal bowel sounds heard. Central nervous system: Alert and oriented. No focal neurological deficits. Extremities: Symmetric 5 x 5 power. Skin: No rashes, lesions or ulcers Psychiatry: Judgement and insight appear normal. Mood & affect appropriate.   Data Reviewed:   CBC: Recent Labs  Lab 02/22/19 1400 02/23/19 0236  WBC 5.3 6.3  HGB 13.2 12.1  HCT 39.7 37.0  MCV 80.5 79.9*  PLT 205  604   Basic Metabolic Panel: Recent Labs  Lab 02/22/19 1400 02/22/19 1855 02/23/19 0236  NA 141  --  138  K 3.5  --  3.8  CL 102  --  102  CO2 26  --  25  GLUCOSE 94  --  188*  BUN 8  --  9  CREATININE 1.04*  --  1.03*   CALCIUM 8.7*  --  8.6*  MG  --  2.0  --    GFR: CrCl cannot be calculated (Unknown ideal weight.). Liver Function Tests: Recent Labs  Lab 02/22/19 1855 02/23/19 0236  AST 18 18  ALT 13 13  ALKPHOS 37* 47  BILITOT 0.8 0.7  PROT 7.1 6.9  ALBUMIN 2.6* 2.6*   No results for input(s): LIPASE, AMYLASE in the last 168 hours. No results for input(s): AMMONIA in the last 168 hours. Coagulation Profile: No results for input(s): INR, PROTIME in the last 168 hours. Cardiac Enzymes: No results for input(s): CKTOTAL, CKMB, CKMBINDEX, TROPONINI in the last 168 hours. BNP (last 3 results) No results for input(s): PROBNP in the last 8760 hours. HbA1C: No results for input(s): HGBA1C in the last 72 hours. CBG: Recent Labs  Lab 02/23/19 0802  GLUCAP 178*   Lipid Profile: Recent Labs    02/22/19 1703  TRIG 102   Thyroid Function Tests: No results for input(s): TSH, T4TOTAL, FREET4, T3FREE, THYROIDAB in the last 72 hours. Anemia Panel: Recent Labs    02/22/19 1703 02/23/19 0236  FERRITIN 51 50   Sepsis Labs: Recent Labs  Lab 02/22/19 1722 02/23/19 0236  PROCALCITON  --  <0.10  LATICACIDVEN 1.3  --     Recent Results (from the past 240 hour(s))  SARS CORONAVIRUS 2 (TAT 6-24 HRS) Nasopharyngeal Nasopharyngeal Swab     Status: Abnormal   Collection Time: 02/22/19  4:27 PM   Specimen: Nasopharyngeal Swab  Result Value Ref Range Status   SARS Coronavirus 2 POSITIVE (A) NEGATIVE Final    Comment: RESULT CALLED TO, READ BACK BY AND VERIFIED WITH: D.YAO MD 2115 02/22/19 MCCORMICK K (NOTE) SARS-CoV-2 target nucleic acids are DETECTED. The SARS-CoV-2 RNA is generally detectable in upper and lower respiratory specimens during the acute phase of infection. Positive results are indicative of the presence of SARS-CoV-2 RNA. Clinical correlation with patient history and other diagnostic information is  necessary to determine patient infection status. Positive results do not rule  out bacterial infection or co-infection with other viruses.  The expected result is Negative. Fact Sheet for Patients: SugarRoll.be Fact Sheet for Healthcare Providers: https://www.woods-mathews.com/ This test is not yet approved or cleared by the [place] FDA and  has been authorized for detection and/or diagnosis of SARS-CoV-2 by FDA under an Emergency Use Authorization (EUA). This EUA will remain  in effect (meaning this test can be used) for the  duration of the COVID-19 declaration under Section 564(b)(1) of the Act, 21 U.S.C. section 360bbb-3(b)(1), unless the authorization is terminated or revoked sooner. Performed at [facility], [place] [address], [place], [place] [address]   Blood Culture (routine x 2)     Status: None (Preliminary result)   Collection Time: 02/22/19  7:09 PM   Specimen: BLOOD  Result Value Ref Range Status   Specimen Description BLOOD RIGHT ANTECUBITAL  Final   Special Requests   Final    BOTTLES DRAWN AEROBIC AND ANAEROBIC Blood Culture adequate volume   Culture   Final    NO GROWTH < 12 HOURS  Performed at [facility], [address] [place], [place] [address]    Report Status PENDING  Incomplete  Blood Culture (routine x 2)     Status: None (Preliminary result)   Collection Time: 02/23/19  2:16 AM   Specimen: BLOOD LEFT HAND  Result Value Ref Range Status   Specimen Description BLOOD LEFT HAND  Final   Special Requests   Final    AEROBIC BOTTLE ONLY Blood Culture results may not be optimal due to an inadequate volume of blood received in culture bottles   Culture   Final    NO GROWTH < 12 HOURS Performed at [facility], [address], [place], [place] [address]    Report Status PENDING  Incomplete         Radiology Studies: DG Chest 2 View  Result Date: 02/22/2019 CLINICAL DATA:  Chest pain EXAM: CHEST - 2 VIEW COMPARISON:  07/17/2018 FINDINGS: Cardiac enlargement with  vascular congestion. Bibasilar atelectasis/infiltrate. Probable small pleural effusions. IMPRESSION: Bilateral airspace disease. Favor congestive heart failure and edema over pneumonia. Electronically Signed   By: Marlan Palau M.D.   On: 02/22/2019 14:54        Scheduled Meds: . amLODipine  5 mg Oral Daily  . ascorbic acid  500 mg Oral BID  . enoxaparin (LOVENOX) injection  40 mg Subcutaneous Q24H  . fluticasone  2 spray Each Nare Daily  . gabapentin  100 mg Oral TID  . guaiFENesin  1,200 mg Oral BID  . Ipratropium-Albuterol  2 puff Inhalation TID  . loratadine  10 mg Oral Daily  . methylPREDNISolone (SOLU-MEDROL) injection  60 mg Intravenous Q12H  . pantoprazole  40 mg Oral Q0600  . rosuvastatin  10 mg Oral Daily  . senna  1 tablet Oral BID  . sodium chloride flush  3 mL Intravenous Once  . sodium chloride flush  3 mL Intravenous Q12H  . sodium chloride flush  3 mL Intravenous Q12H  . triamcinolone cream   Topical BID  . zinc sulfate  220 mg Oral Daily   Continuous Infusions: . sodium chloride    . remdesivir 200 mg in sodium chloride 0.9% 250 mL IVPB     Followed by  . [START ON 02/24/2019] remdesivir 100 mg in NS 100 mL       LOS: 1 day   Time spent= 35 mins    Brandolyn Shortridge Joline Maxcy, MD [facility]  If 7PM-7AM, please contact night-coverage  02/23/2019, 8:16 AM
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
## 2019-02-23 NOTE — Evaluation (Signed)
Physical Therapy Evaluation Patient Details Name: [name] [name] MRN: [id number] DOB: February 04, 1950 Today's Date: 02/23/2019   History of Present Illness  Pt is a 69 y.o. female admitted 02/22/19 with worsening SOB and cough; (+) COVID-19. CXR indicated Bilateral airspace disease; favor congestive heart failure and edema over PNA. PMH includes  HTN, chronic knee pain, CKD II.    Clinical Impression  Pt presents with an overall decrease in functional mobility secondary to above. PTA, pt independent and lives with roommate. Today, pt moving well with RW at supervision-level. SpO2 down to 87% on RA, quickly returning to 95% with seated rest. Pt would benefit from continued acute PT services to maximize functional mobility and independence prior to d/c home.   Audio interpreter utilized for native language [place]     Follow Up Recommendations No PT follow up;Supervision - Intermittent    Equipment Recommendations  None recommended by PT    Recommendations for Other Services OT consult     Precautions / Restrictions Precautions Precautions: Fall Restrictions Weight Bearing Restrictions: No      Mobility  Bed Mobility Overal bed mobility: Independent                Transfers Overall transfer level: Modified independent Equipment used: Rolling walker (2 wheeled);None                Ambulation/Gait Ambulation/Gait assistance: Supervision Gait Distance (Feet): 150 Feet Assistive device: Rolling walker (2 wheeled) Gait Pattern/deviations: Step-through pattern;Decreased stride length;Trunk flexed   Gait velocity interpretation: 1.31 - 2.62 ft/sec, indicative of limited community ambulator General Gait Details: Slow, steady gait with RW per pt request; SpO2 down to 87% on RA, returning to 95% with seated rest. DOE 2/4  Stairs            Wheelchair Mobility    Modified Rankin (Stroke Patients Only)       Balance Overall balance assessment: Needs  assistance   Sitting balance-Leahy Scale: Good       Standing balance-Leahy Scale: Fair                               Pertinent Vitals/Pain Pain Assessment: No/denies pain Faces Pain Scale: No hurt    Home Living Family/patient expects to be discharged to:: Private residence Living Arrangements: Non-relatives/Friends Available Help at Discharge: Friend(s);Available PRN/intermittently Type of Home: House       Home Layout: Two level Home Equipment: Walker - 2 wheels Additional Comments: Lives with roommate    Prior Function Level of Independence: Independent         Comments: Ambulatory with intermittent use of RW. Does not drive; relies on friends for transportation     Hand Dominance        Extremity/Trunk Assessment   Upper Extremity Assessment Upper Extremity Assessment: Overall WFL for tasks assessed    Lower Extremity Assessment Lower Extremity Assessment: Generalized weakness    Cervical / Trunk Assessment Cervical / Trunk Assessment: Kyphotic  Communication   Communication: Prefers language other than English;Interpreter utilized  Cognition Arousal/Alertness: Awake/alert Behavior During Therapy: WFL for tasks assessed/performed Overall Cognitive Status: Within Functional Limits for tasks assessed                                 General Comments: WFL for simple tasks via audio interpreter      General Comments  Exercises     Assessment/Plan    PT Assessment Patient needs continued PT services  PT Problem List Decreased strength;Decreased activity tolerance;Decreased balance;Decreased mobility;Cardiopulmonary status limiting activity       PT Treatment Interventions DME instruction;Gait training;Stair training;Functional mobility training;Therapeutic activities;Therapeutic exercise;Balance training;Patient/family education    PT Goals (Current goals can be found in the Care Plan section)  Acute Rehab PT  Goals Patient Stated Goal: "Maybe soon I will be able to walk without the walker" PT Goal Formulation: With patient Time For Goal Achievement: 03/09/19 Potential to Achieve Goals: Good    Frequency Min 3X/week   Barriers to discharge        Co-evaluation               AM-PAC PT "6 Clicks" Mobility  Outcome Measure Help needed turning from your back to your side while in a flat bed without using bedrails?: None Help needed moving from lying on your back to sitting on the side of a flat bed without using bedrails?: None Help needed moving to and from a bed to a chair (including a wheelchair)?: None Help needed standing up from a chair using your arms (e.g., wheelchair or bedside chair)?: None Help needed to walk in hospital room?: A Little Help needed climbing 3-5 steps with a railing? : A Little 6 Click Score: 22    End of Session   Activity Tolerance: Patient tolerated treatment well Patient left: in chair;with call bell/phone within reach Nurse Communication: Mobility status PT Visit Diagnosis: Other abnormalities of gait and mobility (R26.89)    Time: 1440-1510 PT Time Calculation (min) (ACUTE ONLY): 30 min   Charges:   PT Evaluation $PT Eval Moderate Complexity: 1 Mod PT Treatments $Therapeutic Exercise: 8-22 mins       Mabeline Caras, PT, DPT Acute Rehabilitation Services  Pager [phone] Office [place] 02/23/2019, 4:58 PM

## 2019-02-23 NOTE — Progress Notes (Signed)
Patient arrived to 5W from ED. Alert and oriented x4. Patient receiving O2 via nasal cannula 2L. Patient denied pain, dizziness, shortness of breath or nausea. PIV right forearm and left forearm, saline locked. Reportedly a difficult stick. Skin intact. Patient given a bed bath and put on telemetry.  Patient had questions about her new COVID positive diagnosis and her lack of covid-like symptoms. Informed patient about the she can be asymptomatic with COVID but also that she is currently on supplemental oxygen.   Use of audio interpreter. 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 Almost no English. If [place] is unavailable, the patient knows enough [name] to communicate.   Patient instructed on how to use the call-bell and to ask for help for toilet assistance. Given drinks and snacks. No other requests.

## 2019-02-23 NOTE — Evaluation (Signed)
Clinical/Bedside Swallow Evaluation Patient Details  Name: [name] [name] MRN: [id number] Date of Birth: 1950/05/16  Today's Date: 02/23/2019 Time: SLP Start Time (ACUTE ONLY): 1455 SLP Stop Time (ACUTE ONLY): 1519 SLP Time Calculation (min) (ACUTE ONLY): 24 min  Past Medical History:  Past Medical History:  Diagnosis Date  . Hypertension    Past Surgical History:  Past Surgical History:  Procedure Laterality Date  . NO PAST SURGERIES     HPI:  70 year old with history of HTN, chronic knee pain, CKD stage II came to the ER with complaints of worsening shortness of breath and cough for 7 days.  She called her PCP who sent her to the hospital for further evaluation.  Hypoxia down to 88% on room air in the ER per admitting provider.  CXR on 02/22/19 indicated Bilateral airspace disease. Favor congestive heart failure and edema over pneumonia.   Assessment / Plan / Recommendation Clinical Impression  Pt exhibited one incidence of delayed coughing during intake after solids, but min impulsivity noted with bites initially. Pt given min nonverbal cues (gesturing) to consume smaller bites with subsequent reduction of bite size consumption and this eliminated occurrence of delayed coughing. Otherwise, her swallow function appears normal and regular/thin liquid diet recommended.  ST will f/u x1 during hospital stay to r/o mild risk for aspiration d/t impulsivity during meal consumption; thank you for this consult.   SLP Visit Diagnosis: Dysphagia, unspecified (R13.10)    Aspiration Risk  Mild aspiration risk    Diet Recommendation   Regular/thin liquids  Medication Administration: Whole meds with liquid    Other  Recommendations Oral Care Recommendations: Oral care BID   Follow up Recommendations None      Frequency and Duration min 1 x/week  1 week       Prognosis Prognosis for Safe Diet Advancement: Good      Swallow Study   General Date of Onset: 02/22/19 HPI:  69 year old with history of HTN, chronic knee pain, CKD stage II came to the ER with complaints of worsening shortness of breath and cough for 7 days.  She called her PCP who sent her to the hospital for further evaluation.  Hypoxia down to 88% on room air in the ER per admitting provider.  Chest x-ray showing bilateral airspace infiltrate therefore admitted to the hospital. Type of Study: Bedside Swallow Evaluation Previous Swallow Assessment: (n/a) Diet Prior to this Study: Regular;Thin liquids Temperature Spikes Noted: No Respiratory Status: Nasal cannula;Other (comment)(2L) History of Recent Intubation: No Behavior/Cognition: Alert;Cooperative;Other (Comment)(Utilized gestures to see pt for swallow evaluation as ipad not available and pt speaks min [place] ) Oral Cavity Assessment: Within Functional Limits Oral Care Completed by SLP: No Oral Cavity - Dentition: Adequate natural dentition Vision: Functional for self-feeding Self-Feeding Abilities: Able to feed self Patient Positioning: Upright in chair Baseline Vocal Quality: Normal Volitional Cough: Strong Volitional Swallow: Able to elicit    Oral/Motor/Sensory Function Overall Oral Motor/Sensory Function: Within functional limits   Ice Chips Ice chips: Not tested   Thin Liquid Thin Liquid: Within functional limits Presentation: Cup;Straw;Self Fed    Nectar Thick Nectar Thick Liquid: Not tested   Honey Thick Honey Thick Liquid: Not tested   Puree Puree: Within functional limits Presentation: Self Fed   Solid     Solid: Within functional limits Presentation: Self Fed Other Comments: (large bites initially, gesturing, pt reduced to small)      Tressie Stalker, M.S., CCC-SLP 02/23/2019,3:49 PM

## 2019-02-24 LAB — BASIC METABOLIC PANEL
Anion gap: 11 (ref 5–15)
BUN: 19 mg/dL (ref 8–23)
CO2: 24 mmol/L (ref 22–32)
Calcium: 8.7 mg/dL — ABNORMAL LOW (ref 8.9–10.3)
Chloride: 104 mmol/L (ref 98–111)
Creatinine, Ser: 1.01 mg/dL — ABNORMAL HIGH (ref 0.44–1.00)
GFR calc Af Amer: >60 mL/min (ref >60)
GFR calc non Af Amer: 56 mL/min — ABNORMAL LOW (ref >60)
Glucose, Bld: 134 mg/dL — ABNORMAL HIGH (ref 70–99)
Potassium: 4.7 mmol/L (ref 3.5–5.1)
Sodium: 139 mmol/L (ref 135–145)

## 2019-02-24 LAB — CBC
HCT: 34.4 % — ABNORMAL LOW (ref 36.0–46.0)
Hemoglobin: 11.2 g/dL — ABNORMAL LOW (ref 12.0–15.0)
MCH: 26.1 pg (ref 26.0–34.0)
MCHC: 32.6 g/dL (ref 30.0–36.0)
MCV: 80.2 fL (ref 80.0–100.0)
Platelets: 224 10*3/uL (ref 150–400)
RBC: 4.29 MIL/uL (ref 3.87–5.11)
RDW: 16.5 % — ABNORMAL HIGH (ref 11.5–15.5)
WBC: 8.8 10*3/uL (ref 4.0–10.5)
nRBC: 0 % (ref 0.0–0.2)

## 2019-02-24 LAB — GLUCOSE, CAPILLARY: Glucose-Capillary: 183 mg/dL — ABNORMAL HIGH (ref 70–99)

## 2019-02-24 LAB — MAGNESIUM: Magnesium: 1.9 mg/dL (ref 1.7–2.4)

## 2019-02-24 LAB — FERRITIN: Ferritin: 35 ng/mL (ref 11–307)

## 2019-02-24 LAB — C-REACTIVE PROTEIN: CRP: 1.5 mg/dL — ABNORMAL HIGH (ref <1.0)

## 2019-02-24 LAB — D-DIMER, QUANTITATIVE: D-Dimer, Quant: 2.67 ug/mL-FEU — ABNORMAL HIGH (ref 0.00–0.50)

## 2019-02-24 LAB — PROCALCITONIN: Procalcitonin: <0.1 ng/mL

## 2019-02-24 NOTE — Plan of Care (Signed)
POC discussed with pt. Interpreter required, answers simple yes/no questions appropriately. No c/o pain. In NSR/SB all night. BP stable, afebrile. On 2 L [place] 02. Sat>96%. No BM this shift, adequate urine output via BSC. Able to turn self in bed, x1 assist. No other issues noted at this time.

## 2019-02-24 NOTE — Progress Notes (Signed)
PROGRESS NOTE    [name] [name]  QVZ:[id number] DOB: [birth year] DOA: 02/22/2019 PCP: Antony Blackbird, MD   Brief Narrative:  69 year old with history of HTN, chronic knee pain, CKD stage II came to the ER with complaints of worsening shortness of breath and cough for 7 days.  She called her PCP who sent her to the hospital for further evaluation.  Hypoxia down to 88% on room air in the ER per admitting provider.  Chest x-ray showing bilateral airspace infiltrate therefore admitted to the hospital.   Assessment & Plan:   Principal Problem:   Acute respiratory failure with hypoxia (HCC) Active Problems:   Hypertension   Knee osteoarthritis   Essential hypertension, benign   Flu-like symptoms   COVID-19  Acute hypoxic respiratory failure, 2 L nasal cannula COVID-19 pneumonia -Oxygen levels-2 L nasal cannula. Will attempt to wean off. RN instructed.  -Remdesivir-day 2 -Solumedrol-D3 -Routine: Labs have been reviewed including ferritin, LDH, CRP, d-dimer, fibrinogen.  Will need to trend this lab daily. -Vitamin C & Zinc. Prone >16hrs/day.  -procalcitonin-negative, BNP 190 -Chest x-ray-bilateral infiltrate -Supportive care-antitussive, inhalers, I-S/flutter -CODE STATUS confirmed Out of bed to chair.  OT/PT  Essential hypertension -Norvasc 5 mg daily  CKD stage II -Stable.  Peripheral neuropathy -Gabapentin 3 times daily  Hyperlipidemia -Crestor  Bilateral knee osteoarthritis -Pain control.  DVT prophylaxis: Lovenox Code Status: Full code Family Communication: None at bedside Disposition Plan: Still on 2L [place], will dc once weaned off. Hopefully by tomorrow. In the meantime get PT/OT  Subjective: Feels much better today compared dto yesterday. Still some weakness and exertional shortness of breath.   Review of Systems Otherwise negative except as per HPI, including: General = no fevers, chills, dizziness,  fatigue HEENT/EYES = negative for loss of vision,  double vision, blurred vision,  sore throa Cardiovascular= negative for chest pain, palpitation Respiratory/lungs= negative for  wheezing; hemoptysis,  Gastrointestinal= negative for nausea, vomiting, abdominal pain Genitourinary= negative for Dysuria MSK = Negative for arthralgia, myalgias Neurology= Negative for headache, numbness, tingling  Psychiatry= Negative for suicidal and homocidal ideation Skin= Negative for Rash   Objective: Vitals:   02/24/19 0400 02/24/19 0500 02/24/19 0600 02/24/19 0900  BP: 138/80     Pulse: (!) 56 (!) 54 (!) 51 71  Resp: 18 19 18 19   Temp: 98.2 F (36.8 C)     TempSrc: Oral     SpO2: 98% 100% 98% 96%    Intake/Output Summary (Last 24 hours) at 02/24/2019 1131 Last data filed at 02/24/2019 0900 Gross per 24 hour  Intake 1070 ml  Output 1425 ml  Net -355 ml   There were no vitals filed for this visit.  Examination:  Constitutional: Not in acute distress; 2L [place] Respiratory: mild bibasilar rhonchi. Cardiovascular: Normal sinus rhythm, no rubs Abdomen: Nontender nondistended good bowel sounds Musculoskeletal: No edema noted Skin: No rashes seen Neurologic: CN 2-12 grossly intact.  And nonfocal Psychiatric: Normal judgment and insight. Alert and oriented x 3. Normal mood.     Data Reviewed:   CBC: Recent Labs  Lab 02/22/19 1400 02/23/19 0236 02/24/19 0305  WBC 5.3 6.3 8.8  HGB 13.2 12.1 11.2*  HCT 39.7 37.0 34.4*  MCV 80.5 79.9* 80.2  PLT 205 201 329   Basic Metabolic Panel: Recent Labs  Lab 02/22/19 1400 02/22/19 1855 02/23/19 0236 02/24/19 0305  NA 141  --  138 139  K 3.5  --  3.8 4.7  CL 102  --  102 104  CO2 26  --  25 24  GLUCOSE 94  --  188* 134*  BUN 8  --  9 19  CREATININE 1.04*  --  1.03* 1.01*  CALCIUM 8.7*  --  8.6* 8.7*  MG  --  2.0  --  1.9   GFR: CrCl cannot be calculated (Unknown ideal weight.). Liver Function Tests: Recent Labs  Lab 02/22/19 1855 02/23/19 0236  AST 18 18  ALT 13 13  ALKPHOS  37* 47  BILITOT 0.8 0.7  PROT 7.1 6.9  ALBUMIN 2.6* 2.6*   No results for input(s): LIPASE, AMYLASE in the last 168 hours. No results for input(s): AMMONIA in the last 168 hours. Coagulation Profile: No results for input(s): INR, PROTIME in the last 168 hours. Cardiac Enzymes: No results for input(s): CKTOTAL, CKMB, CKMBINDEX, TROPONINI in the last 168 hours. BNP (last 3 results) No results for input(s): PROBNP in the last 8760 hours. HbA1C: No results for input(s): HGBA1C in the last 72 hours. CBG: Recent Labs  Lab 02/23/19 0802 02/24/19 0927  GLUCAP 178* 183*   Lipid Profile: Recent Labs    02/22/19 1703  TRIG 102   Thyroid Function Tests: No results for input(s): TSH, T4TOTAL, FREET4, T3FREE, THYROIDAB in the last 72 hours. Anemia Panel: Recent Labs    02/23/19 0236 02/24/19 0305  FERRITIN 50 35   Sepsis Labs: Recent Labs  Lab 02/22/19 1722 02/23/19 0236 02/24/19 0305  PROCALCITON  --  <0.10 <0.10  LATICACIDVEN 1.3  --   --     Recent Results (from the past 240 hour(s))  SARS CORONAVIRUS 2 (TAT 6-24 HRS) Nasopharyngeal Nasopharyngeal Swab     Status: Abnormal   Collection Time: 02/22/19  4:27 PM   Specimen: Nasopharyngeal Swab  Result Value Ref Range Status   SARS Coronavirus 2 POSITIVE (A) NEGATIVE Final    Comment: RESULT CALLED TO, READ BACK BY AND VERIFIED WITH: D.YAO MD 2115 02/22/19 MCCORMICK K (NOTE) SARS-CoV-2 target nucleic acids are DETECTED. The SARS-CoV-2 RNA is generally detectable in upper and lower respiratory specimens during the acute phase of infection. Positive results are indicative of the presence of SARS-CoV-2 RNA. Clinical correlation with patient history and other diagnostic information is  necessary to determine patient infection status. Positive results do not rule out bacterial infection or co-infection with other viruses.  The expected result is Negative. Fact Sheet for  Patients: HairSlick.no Fact Sheet for Healthcare Providers: quierodirigir.com This test is not yet approved or cleared by the [place] FDA and  has been authorized for detection and/or diagnosis of SARS-CoV-2 by FDA under an Emergency Use Authorization (EUA). This EUA will remain  in effect (meaning this test can be used) for the  duration of the COVID-19 declaration under Section 564(b)(1) of the Act, 21 U.S.C. section 360bbb-3(b)(1), unless the authorization is terminated or revoked sooner. Performed at [facility], [address] [place], [place] [address]   Blood Culture (routine x 2)     Status: None (Preliminary result)   Collection Time: 02/22/19  7:09 PM   Specimen: BLOOD  Result Value Ref Range Status   Specimen Description BLOOD RIGHT ANTECUBITAL  Final   Special Requests   Final    BOTTLES DRAWN AEROBIC AND ANAEROBIC Blood Culture adequate volume   Culture   Final    NO GROWTH 2 DAYS Performed at [facility], [address] [place], [place] [address]    Report Status PENDING  Incomplete  Culture, Urine  Status: Abnormal (Preliminary result)   Collection Time: 02/22/19 10:41 PM   Specimen: Urine, Random  Result Value Ref Range Status   Specimen Description URINE, RANDOM  Final   Special Requests NONE  Final   Culture (A)  Final    40,000 COLONIES/mL ESCHERICHIA COLI 40,000 COLONIES/mL KLEBSIELLA PNEUMONIAE SUSCEPTIBILITIES TO FOLLOW Performed at [facility], [address], [place], [place] [address]    Report Status PENDING  Incomplete  Blood Culture (routine x 2)     Status: None (Preliminary result)   Collection Time: 02/23/19  2:16 AM   Specimen: BLOOD LEFT HAND  Result Value Ref Range Status   Specimen Description BLOOD LEFT HAND  Final   Special Requests   Final    AEROBIC BOTTLE ONLY Blood Culture results may not be optimal due to an inadequate volume of blood received in  culture bottles   Culture   Final    NO GROWTH 1 DAY Performed at [facility], [address], [place], [place] [address]    Report Status PENDING  Incomplete         Radiology Studies: DG Chest 2 View  Result Date: 02/22/2019 CLINICAL DATA:  Chest pain EXAM: CHEST - 2 VIEW COMPARISON:  07/17/2018 FINDINGS: Cardiac enlargement with vascular congestion. Bibasilar atelectasis/infiltrate. Probable small pleural effusions. IMPRESSION: Bilateral airspace disease. Favor congestive heart failure and edema over pneumonia. Electronically Signed   By: Marlan Palau M.D.   On: 02/22/2019 14:54        Scheduled Meds: . amLODipine  5 mg Oral Daily  . ascorbic acid  500 mg Oral BID  . enoxaparin (LOVENOX) injection  40 mg Subcutaneous Q24H  . fluticasone  2 spray Each Nare Daily  . gabapentin  100 mg Oral TID  . guaiFENesin  1,200 mg Oral BID  . Ipratropium-Albuterol  2 puff Inhalation TID  . loratadine  10 mg Oral Daily  . mouth rinse  15 mL Mouth Rinse BID  . methylPREDNISolone (SOLU-MEDROL) injection  60 mg Intravenous Q12H  . pantoprazole  40 mg Oral Q0600  . rosuvastatin  10 mg Oral Daily  . senna  1 tablet Oral BID  . sodium chloride flush  3 mL Intravenous Once  . sodium chloride flush  3 mL Intravenous Q12H  . sodium chloride flush  3 mL Intravenous Q12H  . triamcinolone cream   Topical BID  . zinc sulfate  220 mg Oral Daily   Continuous Infusions: . sodium chloride    . remdesivir 100 mg in NS 100 mL 100 mg (02/24/19 0944)     LOS: 2 days   Time spent= 25 mins    Arlys Scatena Joline Maxcy, MD [facility]  If 7PM-7AM, please contact night-coverage  02/24/2019, 11:31 AM

## 2019-02-25 LAB — D-DIMER, QUANTITATIVE: D-Dimer, Quant: 2.42 ug/mL-FEU — ABNORMAL HIGH (ref 0.00–0.50)

## 2019-02-25 LAB — BASIC METABOLIC PANEL
Anion gap: 12 (ref 5–15)
BUN: 28 mg/dL — ABNORMAL HIGH (ref 8–23)
CO2: 23 mmol/L (ref 22–32)
Calcium: 8.8 mg/dL — ABNORMAL LOW (ref 8.9–10.3)
Chloride: 105 mmol/L (ref 98–111)
Creatinine, Ser: 1.26 mg/dL — ABNORMAL HIGH (ref 0.44–1.00)
GFR calc Af Amer: 50 mL/min — ABNORMAL LOW (ref >60)
GFR calc non Af Amer: 43 mL/min — ABNORMAL LOW (ref >60)
Glucose, Bld: 155 mg/dL — ABNORMAL HIGH (ref 70–99)
Potassium: 4.9 mmol/L (ref 3.5–5.1)
Sodium: 140 mmol/L (ref 135–145)

## 2019-02-25 LAB — URINE CULTURE: Culture: 40000 — AB

## 2019-02-25 LAB — CBC
HCT: 35.4 % — ABNORMAL LOW (ref 36.0–46.0)
Hemoglobin: 11.6 g/dL — ABNORMAL LOW (ref 12.0–15.0)
MCH: 26.3 pg (ref 26.0–34.0)
MCHC: 32.8 g/dL (ref 30.0–36.0)
MCV: 80.3 fL (ref 80.0–100.0)
Platelets: 259 10*3/uL (ref 150–400)
RBC: 4.41 MIL/uL (ref 3.87–5.11)
RDW: 16.7 % — ABNORMAL HIGH (ref 11.5–15.5)
WBC: 8.8 10*3/uL (ref 4.0–10.5)
nRBC: 0 % (ref 0.0–0.2)

## 2019-02-25 LAB — MAGNESIUM: Magnesium: 2 mg/dL (ref 1.7–2.4)

## 2019-02-25 LAB — GLUCOSE, CAPILLARY: Glucose-Capillary: 137 mg/dL — ABNORMAL HIGH (ref 70–99)

## 2019-02-25 LAB — C-REACTIVE PROTEIN: CRP: 0.8 mg/dL (ref <1.0)

## 2019-02-25 LAB — FERRITIN: Ferritin: 33 ng/mL (ref 11–307)

## 2019-02-25 MED ORDER — CEPHALEXIN 500 MG PO CAPS
500.0000 mg | ORAL_CAPSULE | Freq: Three times a day (TID) | ORAL | Status: DC
  Administered 2019-02-25: 09:00:00 500 mg via ORAL
  Filled 2019-02-25: qty 1

## 2019-02-25 NOTE — Evaluation (Addendum)
Occupational Therapy Evaluation Patient Details Name: [name] [name] MRN: [id number] DOB: October 06, 1950 Today's Date: 02/25/2019    History of Present Illness Pt is a 69 y.o. female admitted 02/22/19 with worsening SOB and cough; (+) COVID-19. CXR indicated Bilateral airspace disease; favor congestive heart failure and edema over PNA. PMH includes  HTN, chronic knee pain, CKD II.   Clinical Impression   This 69 y/o female presents with the above. PTA pt reports mod independent with ADL and functional mobility with intermittent use of RW. Pt overall presenting with decreased activity tolerance and generalized weakness. Pt completing room level mobility with and without RW with overall minguard assist. Pt with no overt LOB without DME, however suspect with further distance mobility pt may benefit from increased UE support/stability given pt with decreased endurance. Pt currently completing LB and toileting ADL at [facility] assist level. Pt trialled on RA with activity with SpO2 decreasing to 86%, with seated rest returns to 90% and greater, reapplied 1L O2 end of session while seated in recliner with SpO2 >90%. Pt will benefit from continued acute OT services, currently recommend [facility] services after discharge to maximize her safety and independence with ADL and mobility. Will follow.   [employer]atus ipad interpreter utilized during session.     Follow Up Recommendations  Home health OT;Supervision - Intermittent    Equipment Recommendations  Tub/shower seat           Precautions / Restrictions Precautions Precautions: Fall Restrictions Weight Bearing Restrictions: No      Mobility Bed Mobility Overal bed mobility: Needs Assistance Bed Mobility: Supine to Sit     Supine to sit: Supervision;HOB elevated     General bed mobility comments: for lines and safety  Transfers Overall transfer level: Needs assistance Equipment used: Rolling walker (2 wheeled);None Transfers: Sit to/from  Stand Sit to Stand: Min guard         General transfer comment: increased effort to stand, minguard for safety and balance, no physical assist required    Balance Overall balance assessment: Needs assistance Sitting-balance support: Feet supported Sitting balance-Leahy Scale: Good     Standing balance support: Bilateral upper extremity supported;No upper extremity supported;During functional activity Standing balance-Leahy Scale: Fair                             ADL either performed or assessed with clinical judgement   ADL Overall ADL's : Needs assistance/impaired Eating/Feeding: Modified independent;Sitting   Grooming: Set up;Sitting   Upper Body Bathing: Set up;Min guard;Sitting   Lower Body Bathing: Min guard;Sit to/from stand   Upper Body Dressing : Sitting;Modified independent   Lower Body Dressing: Min guard;Sit to/from stand   Toilet Transfer: Min guard;Ambulation;RW;Regular Glass blower/designer Details (indicate cue type and reason): with and without RW Toileting- Water quality scientist and Hygiene: Min guard;Sitting/lateral lean;Sit to/from stand Toileting - Water quality scientist Details (indicate cue type and reason): pt performing pericare and managing gown/undewear with minguard for safety     Functional mobility during ADLs: Min guard;Rolling walker(with and without RW)                    Pertinent Vitals/Pain Pain Assessment: Faces Faces Pain Scale: Hurts little more Pain Location: R knee Pain Descriptors / Indicators: Discomfort;Sore Pain Intervention(s): Limited activity within patient's tolerance;Monitored during session;Repositioned     Hand Dominance     Extremity/Trunk Assessment Upper Extremity Assessment Upper Extremity Assessment: Overall WFL for tasks assessed  Lower Extremity Assessment Lower Extremity Assessment: Defer to PT evaluation   Cervical / Trunk Assessment Cervical / Trunk Assessment: Kyphotic    Communication Communication Communication: Prefers language other than English;Interpreter utilized   Cognition Arousal/Alertness: Awake/alert Behavior During Therapy: WFL for tasks assessed/performed Overall Cognitive Status: Difficult to assess                                 General Comments: difficult to fully assess given language barrier, follows simple commands   General Comments       Exercises     Shoulder Instructions      Home Living Family/patient expects to be discharged to:: Private residence Living Arrangements: Non-relatives/Friends Available Help at Discharge: Friend(s);Available PRN/intermittently Type of Home: House       Home Layout: Two level Alternate Level Stairs-Number of Steps: Flight Alternate Level Stairs-Rails: Right     Bathroom Toilet: Standard     Home Equipment: Environmental consultant - 2 wheels   Additional Comments: Lives with roommate      Prior Functioning/Environment Level of Independence: Independent        Comments: Ambulatory with intermittent use of RW. Does not drive; relies on friends for transportation        OT Problem List: Decreased strength;Decreased range of motion;Decreased activity tolerance;Impaired balance (sitting and/or standing);Decreased knowledge of use of DME or AE;Cardiopulmonary status limiting activity;Obesity      OT Treatment/Interventions: Self-care/ADL training;Therapeutic exercise;Energy conservation;DME and/or AE instruction;Therapeutic activities;Patient/family education;Balance training    OT Goals(Current goals can be found in the care plan section) Acute Rehab OT Goals Patient Stated Goal: home soon OT Goal Formulation: With patient Time For Goal Achievement: 03/11/19 Potential to Achieve Goals: Good  OT Frequency: Min 2X/week   Barriers to D/C:            Co-evaluation              AM-PAC OT "6 Clicks" Daily Activity     Outcome Measure Help from another person eating  meals?: None Help from another person taking care of personal grooming?: A Little Help from another person toileting, which includes using toliet, bedpan, or urinal?: A Little Help from another person bathing (including washing, rinsing, drying)?: A Little Help from another person to put on and taking off regular upper body clothing?: None Help from another person to put on and taking off regular lower body clothing?: A Little 6 Click Score: 20   End of Session Equipment Utilized During Treatment: Rolling walker Nurse Communication: Mobility status  Activity Tolerance: Patient tolerated treatment well Patient left: in chair;with call bell/phone within reach  OT Visit Diagnosis: Other abnormalities of gait and mobility (R26.89);Muscle weakness (generalized) (M62.81)                Time: 2376-2831 OT Time Calculation (min): 52 min Charges:  OT General Charges $OT Visit: 1 Visit OT Evaluation $OT Eval Moderate Complexity: 1 Mod OT Treatments $Self Care/Home Management : 23-37 mins  [name] [name], OT Supplemental Rehabilitation Services Pager [phone] Office [phone]   Orlando Penner 02/25/2019, 10:38 AM

## 2019-02-25 NOTE — Progress Notes (Signed)
PROGRESS NOTE    [name] [name]  FOY:[id number] DOB: Apr 30, 1950 DOA: 02/22/2019 PCP: Cain Saupe, MD   Brief Narrative:  69 year old with history of HTN, chronic knee pain, CKD stage II came to the ER with complaints of worsening shortness of breath and cough for 7 days.  She called her PCP who sent her to the hospital for further evaluation.  Hypoxia down to 88% on room air in the ER per admitting provider.  Chest x-ray showing bilateral airspace infiltrate therefore admitted to the hospital.   Assessment & Plan:   Principal Problem:   Acute respiratory failure with hypoxia (HCC) Active Problems:   Hypertension   Knee osteoarthritis   Essential hypertension, benign   Flu-like symptoms   COVID-19  Acute hypoxic respiratory failure, 2-3 L nasal cannula COVID-19 pneumonia -Oxygen levels-still on 2 L nasal cannula.  Some hypoxia with ambulation. -Remdesivir-day 3 -Solumedrol-D4 -Routine: Labs have been reviewed including ferritin, LDH, CRP, d-dimer, fibrinogen.  Will need to trend this lab daily. -Vitamin C & Zinc. Prone >16hrs/day.  -procalcitonin-negative, BNP 190 -Chest x-ray-bilateral infiltrate -Supportive care-antitussive, inhalers, I-S/flutter -CODE STATUS confirmed Out of bed to chair.  OT/PT- Recommend HH OT  Essential hypertension -Norvasc 5 mg daily  CKD stage II -Stable.  Peripheral neuropathy -Gabapentin 3 times daily  Hyperlipidemia -Crestor  Bilateral knee osteoarthritis -Pain control.  DVT prophylaxis: Lovenox Code Status: Full code Family Communication: None at bedside Disposition Plan: Still requiring 2 L nasal cannula but symptomatically feeling much better.  Due to the language barrier and lack of social support at home, she will not do well with outpatient infusion center therefore we will have to complete her IV remdesivir course in the hospital.  Subjective: Feels much better, denies any complaints at this time.  Does have some  exertional dyspnea.  Review of Systems Otherwise negative except as per HPI, including: General = no fevers, chills, dizziness,  fatigue HEENT/EYES = negative for loss of vision, double vision, blurred vision,  sore throa Cardiovascular= negative for chest pain, palpitation Respiratory/lungs= negative for  wheezing; hemoptysis,  Gastrointestinal= negative for nausea, vomiting, abdominal pain Genitourinary= negative for Dysuria MSK = Negative for arthralgia, myalgias Neurology= Negative for headache, numbness, tingling  Psychiatry= Negative for suicidal and homocidal ideation Skin= Negative for Rash  Objective: Vitals:   02/25/19 0148 02/25/19 0200 02/25/19 0300 02/25/19 0400  BP:    116/70  Pulse: (!) 57 (!) 58 (!) 57 (!) 52  Resp: (!) 21 19 (!) 21 (!) 21  Temp:      TempSrc:      SpO2: 97% 98% 95% 96%  Weight:      Height:        Intake/Output Summary (Last 24 hours) at 02/25/2019 1255 Last data filed at 02/25/2019 0548 Gross per 24 hour  Intake 830 ml  Output 1800 ml  Net -970 ml   Filed Weights   02/24/19 1100  Weight: 88.1 kg    Examination: Constitutional: Not in acute distress; 2L [place] Respiratory: Bilateral rhonchi especially at the bases Cardiovascular: Normal sinus rhythm, no rubs Abdomen: Nontender nondistended good bowel sounds Musculoskeletal: No edema noted Skin: No rashes seen Neurologic: CN 2-12 grossly intact.  And nonfocal Psychiatric: Normal judgment and insight. Alert and oriented x 3. Normal mood.      Data Reviewed:   CBC: Recent Labs  Lab 02/22/19 1400 02/23/19 0236 02/24/19 0305 02/25/19 0719  WBC 5.3 6.3 8.8 8.8  HGB 13.2 12.1 11.2* 11.6*  HCT 39.7 37.0  34.4* 35.4*  MCV 80.5 79.9* 80.2 80.3  PLT 205 201 224 259   Basic Metabolic Panel: Recent Labs  Lab 02/22/19 1400 02/22/19 1855 02/23/19 0236 02/24/19 0305 02/25/19 0719  NA 141  --  138 139 140  K 3.5  --  3.8 4.7 4.9  CL 102  --  102 104 105  CO2 26  --  25 24 23    GLUCOSE 94  --  188* 134* 155*  BUN 8  --  9 19 28*  CREATININE 1.04*  --  1.03* 1.01* 1.26*  CALCIUM 8.7*  --  8.6* 8.7* 8.8*  MG  --  2.0  --  1.9 2.0   GFR: Estimated Creatinine Clearance (by C-G formula based on SCr of 1.26 mg/dL (H)) Female: mL/min (A) Female: 54.6 mL/min (A) Liver Function Tests: Recent Labs  Lab 02/22/19 1855 02/23/19 0236  AST 18 18  ALT 13 13  ALKPHOS 37* 47  BILITOT 0.8 0.7  PROT 7.1 6.9  ALBUMIN 2.6* 2.6*   No results for input(s): LIPASE, AMYLASE in the last 168 hours. No results for input(s): AMMONIA in the last 168 hours. Coagulation Profile: No results for input(s): INR, PROTIME in the last 168 hours. Cardiac Enzymes: No results for input(s): CKTOTAL, CKMB, CKMBINDEX, TROPONINI in the last 168 hours. BNP (last 3 results) No results for input(s): PROBNP in the last 8760 hours. HbA1C: No results for input(s): HGBA1C in the last 72 hours. CBG: Recent Labs  Lab 02/23/19 0802 02/24/19 0927 02/25/19 0724  GLUCAP 178* 183* 137*   Lipid Profile: Recent Labs    02/22/19 1703  TRIG 102   Thyroid Function Tests: No results for input(s): TSH, T4TOTAL, FREET4, T3FREE, THYROIDAB in the last 72 hours. Anemia Panel: Recent Labs    02/24/19 0305 02/25/19 0719  FERRITIN 35 33   Sepsis Labs: Recent Labs  Lab 02/22/19 1722 02/23/19 0236 02/24/19 0305  PROCALCITON  --  <0.10 <0.10  LATICACIDVEN 1.3  --   --     Recent Results (from the past 240 hour(s))  SARS CORONAVIRUS 2 (TAT 6-24 HRS) Nasopharyngeal Nasopharyngeal Swab     Status: Abnormal   Collection Time: 02/22/19  4:27 PM   Specimen: Nasopharyngeal Swab  Result Value Ref Range Status   SARS Coronavirus 2 POSITIVE (A) NEGATIVE Final    Comment: RESULT CALLED TO, READ BACK BY AND VERIFIED WITH: D.YAO MD 2115 02/22/19 MCCORMICK K (NOTE) SARS-CoV-2 target nucleic acids are DETECTED. The SARS-CoV-2 RNA is generally detectable in upper and lower respiratory specimens during  the acute phase of infection. Positive results are indicative of the presence of SARS-CoV-2 RNA. Clinical correlation with patient history and other diagnostic information is  necessary to determine patient infection status. Positive results do not rule out bacterial infection or co-infection with other viruses.  The expected result is Negative. Fact Sheet for Patients: 02/24/19 Fact Sheet for Healthcare Providers: HairSlick.no This test is not yet approved or cleared by the [employer] FDA and  has been authorized for detection and/or diagnosis of SARS-CoV-2 by FDA under an Emergency Use Authorization (EUA). This EUA will remain  in effect (meaning this test can be used) for the  duration of the COVID-19 declaration under Section 564(b)(1) of the Act, 21 U.S.C. section 360bbb-3(b)(1), unless the authorization is terminated or revoked sooner. Performed at [facility], [address], [place], [place]   Blood Culture (routine x 2)     Status: None (Preliminary  result)   Collection Time: 02/22/19  7:09 PM   Specimen: BLOOD  Result Value Ref Range Status   Specimen Description BLOOD RIGHT ANTECUBITAL  Final   Special Requests   Final    BOTTLES DRAWN AEROBIC AND ANAEROBIC Blood Culture adequate volume   Culture   Final    NO GROWTH 3 DAYS Performed at [facility], [address] [place], [place] [address]    Report Status PENDING  Incomplete  Culture, Urine     Status: Abnormal   Collection Time: 02/22/19 10:41 PM   Specimen: Urine, Random  Result Value Ref Range Status   Specimen Description URINE, RANDOM  Final   Special Requests   Final    NONE Performed at [facility], [address] [place], [place] [address]    Culture (A)  Final    40,000 COLONIES/mL ESCHERICHIA COLI 40,000 COLONIES/mL KLEBSIELLA PNEUMONIAE Confirmed Extended Spectrum Beta-Lactamase Producer (ESBL).  In  bloodstream infections from ESBL organisms, carbapenems are preferred over piperacillin/tazobactam. They are shown to have a lower risk of mortality.    Report Status 02/25/2019 FINAL  Final   Organism ID, Bacteria ESCHERICHIA COLI (A)  Final   Organism ID, Bacteria KLEBSIELLA PNEUMONIAE (A)  Final      Susceptibility   Escherichia coli - MIC*    AMPICILLIN >=32 RESISTANT Resistant     CEFAZOLIN >=64 RESISTANT Resistant     CEFTRIAXONE >=64 RESISTANT Resistant     CIPROFLOXACIN <=0.25 SENSITIVE Sensitive     GENTAMICIN >=16 RESISTANT Resistant     IMIPENEM <=0.25 SENSITIVE Sensitive     NITROFURANTOIN <=16 SENSITIVE Sensitive     TRIMETH/SULFA >=320 RESISTANT Resistant     AMPICILLIN/SULBACTAM >=32 RESISTANT Resistant     PIP/TAZO 8 SENSITIVE Sensitive     * 40,000 COLONIES/mL ESCHERICHIA COLI   Klebsiella pneumoniae - MIC*    AMPICILLIN >=32 RESISTANT Resistant     CEFAZOLIN >=64 RESISTANT Resistant     CEFTRIAXONE >=64 RESISTANT Resistant     CIPROFLOXACIN 0.5 SENSITIVE Sensitive     GENTAMICIN <=1 SENSITIVE Sensitive     IMIPENEM <=0.25 SENSITIVE Sensitive     NITROFURANTOIN 128 RESISTANT Resistant     TRIMETH/SULFA >=320 RESISTANT Resistant     AMPICILLIN/SULBACTAM >=32 RESISTANT Resistant     PIP/TAZO 8 SENSITIVE Sensitive     * 40,000 COLONIES/mL KLEBSIELLA PNEUMONIAE  Blood Culture (routine x 2)     Status: None (Preliminary result)   Collection Time: 02/23/19  2:16 AM   Specimen: BLOOD LEFT HAND  Result Value Ref Range Status   Specimen Description BLOOD LEFT HAND  Final   Special Requests   Final    AEROBIC BOTTLE ONLY Blood Culture results may not be optimal due to an inadequate volume of blood received in culture bottles   Culture   Final    NO GROWTH 2 DAYS Performed at [facility], [address], [place], [place] [address]    Report Status PENDING  Incomplete         Radiology Studies: No results found.      Scheduled Meds: . amLODipine  5  mg Oral Daily  . ascorbic acid  500 mg Oral BID  . enoxaparin (LOVENOX) injection  40 mg Subcutaneous Q24H  . fluticasone  2 spray Each Nare Daily  . gabapentin  100 mg Oral TID  . guaiFENesin  1,200 mg Oral BID  . Ipratropium-Albuterol  2 puff Inhalation TID  .  loratadine  10 mg Oral Daily  . mouth rinse  15 mL Mouth Rinse BID  . methylPREDNISolone (SOLU-MEDROL) injection  60 mg Intravenous Q12H  . pantoprazole  40 mg Oral Q0600  . rosuvastatin  10 mg Oral Daily  . senna  1 tablet Oral BID  . sodium chloride flush  3 mL Intravenous Once  . sodium chloride flush  3 mL Intravenous Q12H  . triamcinolone cream   Topical BID  . zinc sulfate  220 mg Oral Daily   Continuous Infusions: . remdesivir 100 mg in NS 100 mL 100 mg (02/25/19 0842)     LOS: 3 days   Time spent= 25 mins    Ankit Arsenio Loader, MD [facility]  If 7PM-7AM, please contact night-coverage  02/25/2019, 12:55 PM

## 2019-02-26 DIAGNOSIS — I4891 Unspecified atrial fibrillation: Secondary | ICD-10-CM

## 2019-02-26 DIAGNOSIS — I482 Chronic atrial fibrillation, unspecified: Secondary | ICD-10-CM

## 2019-02-26 HISTORY — DX: Unspecified atrial fibrillation: I48.91

## 2019-02-26 LAB — CBC
HCT: 34.4 % — ABNORMAL LOW (ref 36.0–46.0)
Hemoglobin: 11.3 g/dL — ABNORMAL LOW (ref 12.0–15.0)
MCH: 26.2 pg (ref 26.0–34.0)
MCHC: 32.8 g/dL (ref 30.0–36.0)
MCV: 79.6 fL — ABNORMAL LOW (ref 80.0–100.0)
Platelets: 242 10*3/uL (ref 150–400)
RBC: 4.32 MIL/uL (ref 3.87–5.11)
RDW: 16.6 % — ABNORMAL HIGH (ref 11.5–15.5)
WBC: 9.2 10*3/uL (ref 4.0–10.5)
nRBC: 0 % (ref 0.0–0.2)

## 2019-02-26 LAB — BASIC METABOLIC PANEL
Anion gap: 8 (ref 5–15)
BUN: 29 mg/dL — ABNORMAL HIGH (ref 8–23)
CO2: 23 mmol/L (ref 22–32)
Calcium: 8.4 mg/dL — ABNORMAL LOW (ref 8.9–10.3)
Chloride: 109 mmol/L (ref 98–111)
Creatinine, Ser: 1.11 mg/dL — ABNORMAL HIGH (ref 0.44–1.00)
GFR calc Af Amer: 58 mL/min — ABNORMAL LOW (ref >60)
GFR calc non Af Amer: 50 mL/min — ABNORMAL LOW (ref >60)
Glucose, Bld: 187 mg/dL — ABNORMAL HIGH (ref 70–99)
Potassium: 4.8 mmol/L (ref 3.5–5.1)
Sodium: 140 mmol/L (ref 135–145)

## 2019-02-26 LAB — C-REACTIVE PROTEIN: CRP: 0.7 mg/dL (ref <1.0)

## 2019-02-26 LAB — FERRITIN: Ferritin: 31 ng/mL (ref 11–307)

## 2019-02-26 LAB — GLUCOSE, CAPILLARY
Glucose-Capillary: 169 mg/dL — ABNORMAL HIGH (ref 70–99)
Glucose-Capillary: 401 mg/dL — ABNORMAL HIGH (ref 70–99)

## 2019-02-26 LAB — MAGNESIUM: Magnesium: 2 mg/dL (ref 1.7–2.4)

## 2019-02-26 LAB — D-DIMER, QUANTITATIVE: D-Dimer, Quant: 2.25 ug/mL-FEU — ABNORMAL HIGH (ref 0.00–0.50)

## 2019-02-26 MED ORDER — IPRATROPIUM-ALBUTEROL 20-100 MCG/ACT IN AERS
2.0000 | INHALATION_SPRAY | Freq: Two times a day (BID) | RESPIRATORY_TRACT | Status: DC
  Administered 2019-02-27 – 2019-03-01 (×4): 2 via RESPIRATORY_TRACT
  Filled 2019-02-26: qty 4

## 2019-02-26 MED ORDER — METOPROLOL TARTRATE 5 MG/5ML IV SOLN
5.0000 mg | INTRAVENOUS | Status: AC | PRN
  Administered 2019-02-26 (×2): 5 mg via INTRAVENOUS
  Filled 2019-02-26 (×2): qty 5

## 2019-02-26 MED ORDER — METOPROLOL TARTRATE 25 MG PO TABS
25.0000 mg | ORAL_TABLET | Freq: Two times a day (BID) | ORAL | Status: DC
  Administered 2019-02-26 – 2019-02-27 (×3): 25 mg via ORAL
  Filled 2019-02-26 (×3): qty 1

## 2019-02-26 MED ORDER — ENOXAPARIN SODIUM 100 MG/ML ~~LOC~~ SOLN
1.0000 mg/kg | Freq: Two times a day (BID) | SUBCUTANEOUS | Status: DC
  Administered 2019-02-26 – 2019-02-27 (×2): 90 mg via SUBCUTANEOUS
  Filled 2019-02-26 (×2): qty 1

## 2019-02-26 NOTE — Progress Notes (Signed)
PROGRESS NOTE    [name] [name]  GHW:[id number] DOB: 04-Apr-1950 DOA: 02/22/2019 PCP: Cain Saupe, MD   Brief Narrative:  69 year old with history of HTN, chronic knee pain, CKD stage II came to the ER with complaints of worsening shortness of breath and cough for 7 days.  She called her PCP who sent her to the hospital for further evaluation.  Hypoxia down to 88% on room air in the ER per admitting provider.  Chest x-ray showing bilateral airspace infiltrate therefore admitted to the hospital.  Started on routine treatment with remdesivir and Solu-Medrol, slowly weaned off oxygen.  Intermittently received Lasix which improved her symptoms as well.  Went into atrial fibrillation with RVR on 02/26/2019.   Assessment & Plan:   Principal Problem:   Acute respiratory failure with hypoxia (HCC) Active Problems:   Hypertension   Knee osteoarthritis   Essential hypertension, benign   Flu-like symptoms   COVID-19  Acute hypoxic respiratory failure, 2 L nasal cannula COVID-19 pneumonia -Oxygen levels-Still off and on 2L [place] -Remdesivir-day 4 -Solumedrol-D5 -Routine: Labs have been reviewed including ferritin, LDH, CRP, d-dimer, fibrinogen.  Will need to trend this lab daily. -Vitamin C & Zinc. Prone >16hrs/day.  -procalcitonin-negative, BNP 190 -Chest x-ray-bilateral infiltrate -Supportive care-antitussive, inhalers, I-S/flutter -CODE STATUS confirmed Out of bed to chair.  OT/PT- Recommend HH OT  Atrial fibrillation with RVR -Plan is to give IV Lopressor with p.o. metoprolol.  If necessary will start IV Cardizem.  Start Lovenox 1 mg/kg every 12 hours Hopefully this will improve with her underlying pulmonary issues have resolved  Essential hypertension -Norvasc 5 mg daily  CKD stage II -Stable.  Peripheral neuropathy -Gabapentin 3 times daily  Hyperlipidemia -Crestor  Bilateral knee osteoarthritis -Pain control.  DVT prophylaxis: Lovenox Code Status: Full  code Family Communication: None at bedside Disposition Plan: Is 1 more day of IV remdesivir left, still on 2 L nasal cannula but also went into atrial fibrillation with RVR overnight.  Hopefully able to control this by tomorrow so she can be discharged.  Subjective: Went into atrial fibrillation with RVR overnight but she remains asymptomatic.  Mild exertional dyspnea but overall tells me she feels much better.  Review of Systems Otherwise negative except as per HPI, including: General = no fevers, chills, dizziness,  fatigue HEENT/EYES = negative for loss of vision, double vision, blurred vision,  sore throa Cardiovascular= negative for chest pain, palpitation Respiratory/lungs= negative for  wheezing; hemoptysis,  Gastrointestinal= negative for nausea, vomiting, abdominal pain Genitourinary= negative for Dysuria MSK = Negative for arthralgia, myalgias Neurology= Negative for headache, numbness, tingling  Psychiatry= Negative for suicidal and homocidal ideation Skin= Negative for Rash   Objective: Vitals:   02/25/19 0400 02/25/19 1313 02/25/19 2031 02/26/19 0315  BP: 116/70 124/82 128/71 132/76  Pulse: (!) 52 66 65 67  Resp: (!) 21  17 16   Temp:  98.4 F (36.9 C) 97.8 F (36.6 C) 98.3 F (36.8 C)  TempSrc:  Oral Oral Oral  SpO2: 96% 99% 97%   Weight:      Height:        Intake/Output Summary (Last 24 hours) at 02/26/2019 0834 Last data filed at 02/26/2019 0259 Gross per 24 hour  Intake 340 ml  Output 1200 ml  Net -860 ml   Filed Weights   02/24/19 1100  Weight: 88.1 kg    Examination: Constitutional: Not in acute distress, 2 L nasal cannula Respiratory: Some bibasilar rhonchi Cardiovascular: Tachycardia-irregularly irregular Abdomen: Nontender nondistended good bowel sounds  Musculoskeletal: No edema noted Skin: No rashes seen Neurologic: CN 2-12 grossly intact.  And nonfocal Psychiatric: Normal judgment and insight. Alert and oriented x 3. Normal mood.      Data Reviewed:   CBC: Recent Labs  Lab 02/22/19 1400 02/23/19 0236 02/24/19 0305 02/25/19 0719 02/26/19 0446  WBC 5.3 6.3 8.8 8.8 9.2  HGB 13.2 12.1 11.2* 11.6* 11.3*  HCT 39.7 37.0 34.4* 35.4* 34.4*  MCV 80.5 79.9* 80.2 80.3 79.6*  PLT 205 201 224 259 242   Basic Metabolic Panel: Recent Labs  Lab 02/22/19 1400 02/22/19 1855 02/23/19 0236 02/24/19 0305 02/25/19 0719 02/26/19 0446  NA 141  --  138 139 140 140  K 3.5  --  3.8 4.7 4.9 4.8  CL 102  --  102 104 105 109  CO2 26  --  25 24 23 23   GLUCOSE 94  --  188* 134* 155* 187*  BUN 8  --  9 19 28* 29*  CREATININE 1.04*  --  1.03* 1.01* 1.26* 1.11*  CALCIUM 8.7*  --  8.6* 8.7* 8.8* 8.4*  MG  --  2.0  --  1.9 2.0 2.0   GFR: Estimated Creatinine Clearance (by C-G formula based on SCr of 1.11 mg/dL (H)) Female: mL/min (A) Female: 62 mL/min (A) Liver Function Tests: Recent Labs  Lab 02/22/19 1855 02/23/19 0236  AST 18 18  ALT 13 13  ALKPHOS 37* 47  BILITOT 0.8 0.7  PROT 7.1 6.9  ALBUMIN 2.6* 2.6*   No results for input(s): LIPASE, AMYLASE in the last 168 hours. No results for input(s): AMMONIA in the last 168 hours. Coagulation Profile: No results for input(s): INR, PROTIME in the last 168 hours. Cardiac Enzymes: No results for input(s): CKTOTAL, CKMB, CKMBINDEX, TROPONINI in the last 168 hours. BNP (last 3 results) No results for input(s): PROBNP in the last 8760 hours. HbA1C: No results for input(s): HGBA1C in the last 72 hours. CBG: Recent Labs  Lab 02/23/19 0802 02/24/19 0927 02/25/19 0724 02/26/19 0754  GLUCAP 178* 183* 137* 169*   Lipid Profile: No results for input(s): CHOL, HDL, LDLCALC, TRIG, CHOLHDL, LDLDIRECT in the last 72 hours. Thyroid Function Tests: No results for input(s): TSH, T4TOTAL, FREET4, T3FREE, THYROIDAB in the last 72 hours. Anemia Panel: Recent Labs    02/25/19 0719 02/26/19 0446  FERRITIN 33 31   Sepsis Labs: Recent Labs  Lab 02/22/19 1722  02/23/19 0236 02/24/19 0305  PROCALCITON  --  <0.10 <0.10  LATICACIDVEN 1.3  --   --     Recent Results (from the past 240 hour(s))  SARS CORONAVIRUS 2 (TAT 6-24 HRS) Nasopharyngeal Nasopharyngeal Swab     Status: Abnormal   Collection Time: 02/22/19  4:27 PM   Specimen: Nasopharyngeal Swab  Result Value Ref Range Status   SARS Coronavirus 2 POSITIVE (A) NEGATIVE Final    Comment: RESULT CALLED TO, READ BACK BY AND VERIFIED WITH: D.YAO MD 2115 02/22/19 MCCORMICK K (NOTE) SARS-CoV-2 target nucleic acids are DETECTED. The SARS-CoV-2 RNA is generally detectable in upper and lower respiratory specimens during the acute phase of infection. Positive results are indicative of the presence of SARS-CoV-2 RNA. Clinical correlation with patient history and other diagnostic information is  necessary to determine patient infection status. Positive results do not rule out bacterial infection or co-infection with other viruses.  The expected result is Negative. Fact Sheet for Patients: 02/24/19 Fact Sheet for Healthcare Providers: HairSlick.no This test is not yet approved or cleared by  the [employer] and  has been authorized for detection and/or diagnosis of SARS-CoV-2 by FDA under an Emergency Use Authorization (EUA). This EUA will remain  in effect (meaning this test can be used) for the  duration of the COVID-19 declaration under Section 564(b)(1) of the Act, 21 U.S.C. section 360bbb-3(b)(1), unless the authorization is terminated or revoked sooner. Performed at [facility], [place] [address], [place], [place] [address]   Blood Culture (routine x 2)     Status: None (Preliminary result)   Collection Time: 02/22/19  7:09 PM   Specimen: BLOOD  Result Value Ref Range Status   Specimen Description BLOOD RIGHT ANTECUBITAL  Final   Special Requests   Final    BOTTLES DRAWN AEROBIC AND ANAEROBIC Blood Culture adequate  volume   Culture   Final    NO GROWTH 3 DAYS Performed at [facility], [place] [address], [place], [place] [address]    Report Status PENDING  Incomplete  Culture, Urine     Status: Abnormal   Collection Time: 02/22/19 10:41 PM   Specimen: Urine, Random  Result Value Ref Range Status   Specimen Description URINE, RANDOM  Final   Special Requests   Final    NONE Performed at [facility], [place] [address], [place], [place] [address]    Culture (A)  Final    40,000 COLONIES/mL ESCHERICHIA COLI 40,000 COLONIES/mL KLEBSIELLA PNEUMONIAE Confirmed Extended Spectrum Beta-Lactamase Producer (ESBL).  In bloodstream infections from ESBL organisms, carbapenems are preferred over piperacillin/tazobactam. They are shown to have a lower risk of mortality.    Report Status 02/25/2019 FINAL  Final   Organism ID, Bacteria ESCHERICHIA COLI (A)  Final   Organism ID, Bacteria KLEBSIELLA PNEUMONIAE (A)  Final      Susceptibility   Escherichia coli - MIC*    AMPICILLIN >=32 RESISTANT Resistant     CEFAZOLIN >=64 RESISTANT Resistant     CEFTRIAXONE >=64 RESISTANT Resistant     CIPROFLOXACIN <=0.25 SENSITIVE Sensitive     GENTAMICIN >=16 RESISTANT Resistant     IMIPENEM <=0.25 SENSITIVE Sensitive     NITROFURANTOIN <=16 SENSITIVE Sensitive     TRIMETH/SULFA >=320 RESISTANT Resistant     AMPICILLIN/SULBACTAM >=32 RESISTANT Resistant     PIP/TAZO 8 SENSITIVE Sensitive     * 40,000 COLONIES/mL ESCHERICHIA COLI   Klebsiella pneumoniae - MIC*    AMPICILLIN >=32 RESISTANT Resistant     CEFAZOLIN >=64 RESISTANT Resistant     CEFTRIAXONE >=64 RESISTANT Resistant     CIPROFLOXACIN 0.5 SENSITIVE Sensitive     GENTAMICIN <=1 SENSITIVE Sensitive     IMIPENEM <=0.25 SENSITIVE Sensitive     NITROFURANTOIN 128 RESISTANT Resistant     TRIMETH/SULFA >=320 RESISTANT Resistant     AMPICILLIN/SULBACTAM >=32 RESISTANT Resistant     PIP/TAZO 8 SENSITIVE Sensitive     * 40,000 COLONIES/mL KLEBSIELLA  PNEUMONIAE  Blood Culture (routine x 2)     Status: None (Preliminary result)   Collection Time: 02/23/19  2:16 AM   Specimen: BLOOD LEFT HAND  Result Value Ref Range Status   Specimen Description BLOOD LEFT HAND  Final   Special Requests   Final    AEROBIC BOTTLE ONLY Blood Culture results may not be optimal due to an inadequate volume of blood received in culture bottles   Culture   Final    NO GROWTH 2 DAYS Performed at [facility], [address], [place], [place] [address]  Report Status PENDING  Incomplete         Radiology Studies: No results found.      Scheduled Meds: . amLODipine  5 mg Oral Daily  . ascorbic acid  500 mg Oral BID  . enoxaparin (LOVENOX) injection  40 mg Subcutaneous Q24H  . fluticasone  2 spray Each Nare Daily  . gabapentin  100 mg Oral TID  . guaiFENesin  1,200 mg Oral BID  . Ipratropium-Albuterol  2 puff Inhalation TID  . loratadine  10 mg Oral Daily  . mouth rinse  15 mL Mouth Rinse BID  . methylPREDNISolone (SOLU-MEDROL) injection  60 mg Intravenous Q12H  . pantoprazole  40 mg Oral Q0600  . rosuvastatin  10 mg Oral Daily  . senna  1 tablet Oral BID  . sodium chloride flush  3 mL Intravenous Q12H  . triamcinolone cream   Topical BID  . zinc sulfate  220 mg Oral Daily   Continuous Infusions: . remdesivir 100 mg in NS 100 mL 100 mg (02/25/19 0842)     LOS: 4 days   Time spent= 25 mins    [place] Elling Joline Maxcy, MD [facility]  If 7PM-7AM, please contact night-coverage  02/26/2019, 8:34 AM

## 2019-02-26 NOTE — Progress Notes (Signed)
Physical Therapy Treatment Patient Details Name: [name] [name] MRN: [id number] DOB: March 15, 1950 Today's Date: 02/26/2019    History of Present Illness Pt is a 69 y.o. female admitted 02/22/19 with worsening SOB and cough; (+) COVID-19. CXR indicated Bilateral airspace disease; favor congestive heart failure and edema over PNA. 02/25/19 developed afib with RVR PMH includes  HTN, chronic knee pain, CKD II.    PT Comments    Patient developed afib overnight. Per RN, MD wants to see how her HR does with activity prior to adjusting meds. At rest HR 118 bpm. Via interpreter, pt reporting her elevated HR is due to consuming coffee x 2 days. Pt eager to walk and get stronger to prepare for discharge home. Unfortunately, activity limited due to HR increase. On initial standing increased to 150 bpm. While using interpreter, trying to get pt to understand she could not walk to bathroom due to incr HR, her HR continued to incr to 178 bpm. Finally she sat on BSC. And gradually decreased to 130s and allowed stand-pivot BSC to chair. Patient asking for various medical issues to be addressed (cream for knee pain, ear wax to be cleaned out, and blurry vision that has been going on for years). Agreed to relay this information to her RN and/or MD.    Follow Up Recommendations  No PT follow up;Supervision - Intermittent     Equipment Recommendations  None recommended by PT    Recommendations for Other Services       Precautions / Restrictions Precautions Precautions: Fall Restrictions Weight Bearing Restrictions: No    Mobility  Bed Mobility Overal bed mobility: Modified Independent Bed Mobility: Supine to Sit     Supine to sit: Modified independent (Device/Increase time)     General bed mobility comments: good awareness of lines, managed linens  Transfers Overall transfer level: Needs assistance Equipment used: None Transfers: Sit to/from [employer] Sit to Stand: Min  guard Stand pivot transfers: Min guard       General transfer comment: close guarding due to elevated HR; difficult to get pt to understand (via audio interpreter) that she needed to sit on BSC due to elevated HR (she wanted to walk to bathroom)  Ambulation/Gait             General Gait Details: deferred due to HR 150-178   Stairs             Wheelchair Mobility    Modified Rankin (Stroke Patients Only)       Balance Overall balance assessment: Needs assistance Sitting-balance support: Feet supported Sitting balance-Leahy Scale: Good     Standing balance support: No upper extremity supported;During functional activity Standing balance-Leahy Scale: Good                              Cognition Arousal/Alertness: Awake/alert Behavior During Therapy: WFL for tasks assessed/performed Overall Cognitive Status: Within Functional Limits for tasks assessed                                        Exercises      General Comments General comments (skin integrity, edema, etc.): stratus audio interpreter, Alla German. Increased time for all tasks due to use of interpreter.       Pertinent Vitals/Pain Pain Assessment: No/denies pain    Home Living  Prior Function            PT Goals (current goals can now be found in the care plan section) Acute Rehab PT Goals Patient Stated Goal: home soon Time For Goal Achievement: 03/09/19 Potential to Achieve Goals: Good Progress towards PT goals: Not progressing toward goals - comment(now in afib with HR limiting mobility)    Frequency    Min 3X/week      PT Plan Current plan remains appropriate    Co-evaluation              AM-PAC PT "6 Clicks" Mobility   Outcome Measure  Help needed turning from your back to your side while in a flat bed without using bedrails?: None Help needed moving from lying on your back to sitting on the side of a flat bed  without using bedrails?: None Help needed moving to and from a bed to a chair (including a wheelchair)?: None Help needed standing up from a chair using your arms (e.g., wheelchair or bedside chair)?: None Help needed to walk in hospital room?: A Little Help needed climbing 3-5 steps with a railing? : A Little 6 Click Score: 22    End of Session   Activity Tolerance: Treatment limited secondary to medical complications (Comment) Patient left: in chair;with call bell/phone within reach Nurse Communication: Mobility status;Other (comment)(HR response; also notified MD (he requested pt be seen)) PT Visit Diagnosis: Other abnormalities of gait and mobility (R26.89)     Time: 7425-9563 PT Time Calculation (min) (ACUTE ONLY): 41 min  Charges:  $Therapeutic Activity: 23-37 mins $Self Care/Home Management: 8-22                      Arby Barrette, PT Pager [phone]    Rexanne Mano 02/26/2019, 3:04 PM

## 2019-02-26 NOTE — Progress Notes (Signed)
Central Telemetry called and said patient was in atrial fibrillation.  Patient was asleep and awaken and denied any complaints  or shortness of breath at present time.  Vital signs stable.   Doctor on call for [facility] called and informed and order noted to get EKG.  This was done and faxed to doctor.  No further orders noted at this tine.  Will continue to monitor patient closely.

## 2019-02-27 ENCOUNTER — Ambulatory Visit: Payer: Self-pay | Admitting: Orthopedic Surgery

## 2019-02-27 LAB — FERRITIN: Ferritin: 33 ng/mL (ref 11–307)

## 2019-02-27 LAB — MAGNESIUM: Magnesium: 2.1 mg/dL (ref 1.7–2.4)

## 2019-02-27 LAB — BASIC METABOLIC PANEL
Anion gap: 9 (ref 5–15)
BUN: 36 mg/dL — ABNORMAL HIGH (ref 8–23)
CO2: 22 mmol/L (ref 22–32)
Calcium: 8.4 mg/dL — ABNORMAL LOW (ref 8.9–10.3)
Chloride: 106 mmol/L (ref 98–111)
Creatinine, Ser: 1.34 mg/dL — ABNORMAL HIGH (ref 0.44–1.00)
GFR calc Af Amer: 46 mL/min — ABNORMAL LOW (ref >60)
GFR calc non Af Amer: 40 mL/min — ABNORMAL LOW (ref >60)
Glucose, Bld: 295 mg/dL — ABNORMAL HIGH (ref 70–99)
Potassium: 4.6 mmol/L (ref 3.5–5.1)
Sodium: 137 mmol/L (ref 135–145)

## 2019-02-27 LAB — CBC
HCT: 34.9 % — ABNORMAL LOW (ref 36.0–46.0)
Hemoglobin: 11.6 g/dL — ABNORMAL LOW (ref 12.0–15.0)
MCH: 26.1 pg (ref 26.0–34.0)
MCHC: 33.2 g/dL (ref 30.0–36.0)
MCV: 78.6 fL — ABNORMAL LOW (ref 80.0–100.0)
Platelets: 271 10*3/uL (ref 150–400)
RBC: 4.44 MIL/uL (ref 3.87–5.11)
RDW: 16.7 % — ABNORMAL HIGH (ref 11.5–15.5)
WBC: 11.5 10*3/uL — ABNORMAL HIGH (ref 4.0–10.5)
nRBC: 0 % (ref 0.0–0.2)

## 2019-02-27 LAB — CULTURE, BLOOD (ROUTINE X 2)
Culture: NO GROWTH
Special Requests: ADEQUATE

## 2019-02-27 LAB — GLUCOSE, CAPILLARY
Glucose-Capillary: 170 mg/dL — ABNORMAL HIGH (ref 70–99)
Glucose-Capillary: 291 mg/dL — ABNORMAL HIGH (ref 70–99)

## 2019-02-27 LAB — TSH: TSH: 0.08 u[IU]/mL — ABNORMAL LOW (ref 0.350–4.500)

## 2019-02-27 LAB — C-REACTIVE PROTEIN: CRP: 0.9 mg/dL (ref <1.0)

## 2019-02-27 LAB — D-DIMER, QUANTITATIVE: D-Dimer, Quant: 1.88 ug/mL-FEU — ABNORMAL HIGH (ref 0.00–0.50)

## 2019-02-27 MED ORDER — APIXABAN 5 MG PO TABS
5.0000 mg | ORAL_TABLET | Freq: Two times a day (BID) | ORAL | Status: DC
  Administered 2019-02-27 (×2): 5 mg via ORAL
  Filled 2019-02-27 (×3): qty 1

## 2019-02-27 MED ORDER — METOPROLOL TARTRATE 25 MG PO TABS
25.0000 mg | ORAL_TABLET | Freq: Once | ORAL | Status: AC
  Administered 2019-02-27: 11:00:00 25 mg via ORAL
  Filled 2019-02-27: qty 1

## 2019-02-27 MED ORDER — METOPROLOL TARTRATE 50 MG PO TABS
50.0000 mg | ORAL_TABLET | Freq: Two times a day (BID) | ORAL | Status: DC
  Administered 2019-02-27 – 2019-03-01 (×3): 50 mg via ORAL
  Filled 2019-02-27 (×5): qty 1

## 2019-02-27 NOTE — Progress Notes (Signed)
Physical Therapy Treatment Patient Details Name: [name] [name] MRN: [id number] DOB: 1950/09/08 Today's Date: 02/27/2019    History of Present Illness Pt is a 69 y.o. female admitted 02/22/19 with worsening SOB and cough; (+) COVID-19. CXR indicated Bilateral airspace disease; favor congestive heart failure and edema over PNA. 02/25/19 developed afib with RVR PMH includes  HTN, chronic knee pain, CKD II.    PT Comments    Pt up in recliner on entry and once [name] [name] interpreter secured on ipad, pt requests to get up and walk to bathroom. Pt is min guard for transfers and ambulation of 40 total feet of ambulation without AD as pt refused PT request for RW use. Pt reports being grateful to not have to use a cane to walk however pt also reports increased pain in B knees with ambulation. Tried to educate through interpreter that she would be able to walk further and more safely with RW. Unsure pt understands. Pt continues to be in Afib today with HR max of 148 bpm with activity. PT changing recommendation to HHPT level rehab given the reduction in ambulation distance tolerance since admission and unsteadiness of gait. PT will continue to follow acutely.    Follow Up Recommendations  Home health PT;Supervision - Intermittent           Precautions / Restrictions Precautions Precautions: Fall Restrictions Weight Bearing Restrictions: No    Mobility  Bed Mobility               General bed mobility comments: OOB in recliner   Transfers Overall transfer level: Needs assistance Equipment used: None Transfers: Sit to/from Stand;Stand Pivot Transfers Sit to Stand: Min guard         General transfer comment: min guard for power up and steadying  Ambulation/Gait Ambulation/Gait assistance: Min guard Gait Distance (Feet): 20 Feet(2x10, 1 x 20 ) Assistive device: None Gait Pattern/deviations: Step-through pattern;Decreased stride length;Trunk flexed Gait velocity: slowed Gait  velocity interpretation: <1.31 ft/sec, indicative of household ambulator General Gait Details: slow, very unsteady gait, with low guard UE positioning, refuses use of AD, as distance increased pt with increasing B knee pain contributing to increasing instability, encouraged pt to use RW/cane but she is determined not to use AD        Balance Overall balance assessment: Needs assistance Sitting-balance support: Feet supported Sitting balance-Leahy Scale: Good     Standing balance support: No upper extremity supported;During functional activity Standing balance-Leahy Scale: Good                              Cognition Arousal/Alertness: Awake/alert Behavior During Therapy: WFL for tasks assessed/performed Overall Cognitive Status: Within Functional Limits for tasks assessed                                 General Comments: utilized Tax inspector #[id number]         General Comments General comments (skin integrity, edema, etc.): HR at rest 90-127bpm, with activity HR max 148bpm, Increased time for all tasks due to use of interpreter       Pertinent Vitals/Pain Pain Assessment: Faces Faces Pain Scale: Hurts little more Pain Location: bil knees and back Pain Descriptors / Indicators: Aching;Discomfort;Sore Pain Intervention(s): Limited activity within patient's tolerance;Monitored during session;Repositioned           PT Goals (current goals can now  be found in the care plan section) Acute Rehab PT Goals Patient Stated Goal: home soon PT Goal Formulation: With patient Time For Goal Achievement: 03/09/19 Potential to Achieve Goals: Good Progress towards PT goals: Progressing toward goals    Frequency    Min 3X/week      PT Plan Discharge plan needs to be updated       AM-PAC PT "6 Clicks" Mobility   Outcome Measure  Help needed turning from your back to your side while in a flat bed without using bedrails?: None Help  needed moving from lying on your back to sitting on the side of a flat bed without using bedrails?: None Help needed moving to and from a bed to a chair (including a wheelchair)?: None Help needed standing up from a chair using your arms (e.g., wheelchair or bedside chair)?: None Help needed to walk in hospital room?: None Help needed climbing 3-5 steps with a railing? : A Lot 6 Click Score: 22    End of Session   Activity Tolerance: Patient tolerated treatment well Patient left: in chair;with call bell/phone within reach Nurse Communication: Mobility status PT Visit Diagnosis: Other abnormalities of gait and mobility (R26.89)     Time: 4315-4008 PT Time Calculation (min) (ACUTE ONLY): 36 min  Charges:  $Gait Training: 8-22 mins                     Prabhnoor Ellenberger B. Beverely Risen PT, DPT Acute Rehabilitation Services Pager [phone] Office [phone]    Elon Alas Fleet 02/27/2019, 1:44 PM

## 2019-02-27 NOTE — Progress Notes (Signed)
ANTICOAGULATION CONSULT NOTE - Initial Consult  Pharmacy Consult for Eliquis Indication: atrial fibrillation  No Known Allergies  Patient Measurements: Height: 5\' 4"  (162.6 cm) Weight: 194 lb 3.6 oz (88.1 kg) IBW/kg (Calculated) : 54.7  Vital Signs: BP: 106/76 (01/27 0800) Pulse Rate: 66 (01/27 0800)  Labs: Recent Labs    02/25/19 0719 02/25/19 0719 02/26/19 0446 02/27/19 0240  HGB 11.6*   < > 11.3* 11.6*  HCT 35.4*  --  34.4* 34.9*  PLT 259  --  242 271  CREATININE 1.26*  --  1.11* 1.34*   < > = values in this interval not displayed.    Estimated Creatinine Clearance (by C-G formula based on SCr of 1.34 mg/dL (H)) Female: 42 mL/min (A) Female: 51.4 mL/min (A)   Medical History: Past Medical History:  Diagnosis Date  . Hypertension     Assessment:  Anticoag: d-dimer 2.25 down. BMI 33.  LMWH increased to 90mg /12h after going into afib. CHADS2VASC 3. Change to Eliquis 1/27.  CBC stable. Scr 1.34 up today.  Goal of Therapy:  Therapeutic oral anticoagulation   Plan:  - d/c Lovenox - Eliquis 5mg  BID for afib Glucose control?   Ahmani Daoud S. [birth year]-22-1970, PharmD, BCPS Clinical Staff Pharmacist [email or web address] 2/27, 02/27/2019,10:36 AM

## 2019-02-27 NOTE — Progress Notes (Signed)
Inpatient Diabetes Program Recommendations  AACE/ADA: New Consensus Statement on Inpatient Glycemic Control (2015)  Target Ranges:  Prepandial:   less than 140 mg/dL      Peak postprandial:   less than 180 mg/dL (1-2 hours)      Critically ill patients:  140 - 180 mg/dL   Lab Results  Component Value Date   GLUCAP 170 (H) 02/27/2019    Review of Glycemic Control Results for [name] [name], [name] [name] (MRN [id number]) as of 02/27/2019 10:31  Ref. Range 02/26/2019 07:54 02/26/2019 20:46 02/27/2019 07:37  Glucose-Capillary Latest Ref Range: 70 - 99 mg/dL 872 (H) 158 (H) 727 (H)   Diabetes history: No history noted Outpatient Diabetes medications: none Current orders for Inpatient glycemic control: none Solumedrol 60 mg BID  Inpatient Diabetes Program Recommendations:    Noted yesterday CBG elevated to 401 mg/dL, following serum was 618 mg/DL.   In the setting of steroids and if patient to remain inpatient, Consider: -Changing diet to carb mod -Adding Novolog 0-9 units TID & Hs  Thanks, Lujean Rave, MSN, RNC-OB Diabetes Coordinator [phone] (8a-5p)

## 2019-02-27 NOTE — Progress Notes (Signed)
PROGRESS NOTE    [name] [name]  CVE:[id number] DOB: 07-Dec-1950 DOA: 02/22/2019 PCP: Antony Blackbird, MD   Brief Narrative:  69 year old with history of HTN, chronic knee pain, CKD stage II came to the ER with complaints of worsening shortness of breath and cough for 7 days.  She called her PCP who sent her to the hospital for further evaluation.  Hypoxia down to 88% on room air in the ER per admitting provider.  Chest x-ray showing bilateral airspace infiltrate therefore admitted to the hospital.  Started on routine treatment with remdesivir and Solu-Medrol, slowly weaned off oxygen.  Intermittently received Lasix which improved her symptoms as well.  Went into atrial fibrillation with RVR on 02/26/2019.  She was started on beta-blocker and anticoagulation.   Assessment & Plan:   Principal Problem:   Acute respiratory failure with hypoxia (HCC) Active Problems:   Hypertension   Knee osteoarthritis   Essential hypertension, benign   Flu-like symptoms   COVID-19   Atrial fibrillation with RVR (HCC)  Acute hypoxic respiratory failure, 2 L nasal cannula COVID-19 pneumonia -Oxygen levels-Room Air -Remdesivir-day 5 -Solumedrol-D6 -Routine: Labs have been reviewed including ferritin, LDH, CRP, d-dimer, fibrinogen.  Will need to trend this lab daily. -Vitamin C & Zinc. Prone >16hrs/day.  -procalcitonin-negative, BNP 190 -Chest x-ray-bilateral infiltrate -Supportive care-antitussive, inhalers, I-S/flutter -CODE STATUS confirmed Out of bed to chair.  OT/PT- Recommend HH OT  Atrial fibrillation with RVR, CHADs2VAsc atleast 3 -Increase metoprolol to 50 mg twice daily.  Start Eliquis. -Monitor electrolytes and replete as deemed appropriate.  Essential hypertension -Norvasc 5 mg daily  CKD stage II -Stable.  Peripheral neuropathy -Gabapentin 3 times daily  Hyperlipidemia -Crestor  Bilateral knee osteoarthritis -Pain control.  DVT prophylaxis: Lovenox Code Status: Full  code Family Communication: None at bedside Disposition Plan: Patient is still frequently going into atrial fibrillation with RVR.  Medication adjustments ongoing for better rate control prior to discharge regarding stable condition.  Subjective: Communicated with the patient using a translator for [employer] interpreter ID number [phone]  Patient remains in atrial fibrillation with RVR but denies any complaints overall she feels better.  Review of Systems Otherwise negative except as per HPI, including: General = no fevers, chills, dizziness,  fatigue HEENT/EYES = negative for loss of vision, double vision, blurred vision,  sore throa Cardiovascular= negative for chest pain, palpitation Respiratory/lungs= negative for shortness of breath, cough, wheezing; hemoptysis,  Gastrointestinal= negative for nausea, vomiting, abdominal pain Genitourinary= negative for Dysuria MSK = Negative for arthralgia, myalgias Neurology= Negative for headache, numbness, tingling  Psychiatry= Negative for suicidal and homocidal ideation Skin= Negative for Rash  Objective: Vitals:   02/27/19 0640 02/27/19 0641 02/27/19 0715 02/27/19 0800  BP:  107/84  106/76  Pulse:  81 73 66  Resp:  15 16 (!) 21  Temp:      TempSrc:      SpO2: 99%  92% 94%  Weight:      Height:        Intake/Output Summary (Last 24 hours) at 02/27/2019 1112 Last data filed at 02/26/2019 1828 Gross per 24 hour  Intake 600 ml  Output --  Net 600 ml   Filed Weights   02/24/19 1100  Weight: 88.1 kg    Examination: Constitutional: Not in acute distress Respiratory: Clear to auscultation bilaterally Cardiovascular: Tachycardia-irregularly irregular Abdomen: Nontender nondistended good bowel sounds Musculoskeletal: No edema noted Skin: No rashes seen Neurologic: CN 2-12 grossly intact.  And nonfocal Psychiatric: Normal judgment and insight. Alert  and oriented x 3. Normal mood.     Data Reviewed:   CBC: Recent Labs  Lab  02/23/19 0236 02/24/19 0305 02/25/19 0719 02/26/19 0446 02/27/19 0240  WBC 6.3 8.8 8.8 9.2 11.5*  HGB 12.1 11.2* 11.6* 11.3* 11.6*  HCT 37.0 34.4* 35.4* 34.4* 34.9*  MCV 79.9* 80.2 80.3 79.6* 78.6*  PLT 201 224 259 242 271   Basic Metabolic Panel: Recent Labs  Lab 02/22/19 1400 02/22/19 1855 02/23/19 0236 02/24/19 0305 02/25/19 0719 02/26/19 0446 02/27/19 0240  NA   < >  --  138 139 140 140 137  K   < >  --  3.8 4.7 4.9 4.8 4.6  CL   < >  --  102 104 105 109 106  CO2   < >  --  25 24 23 23 22   GLUCOSE   < >  --  188* 134* 155* 187* 295*  BUN   < >  --  9 19 28* 29* 36*  CREATININE   < >  --  1.03* 1.01* 1.26* 1.11* 1.34*  CALCIUM   < >  --  8.6* 8.7* 8.8* 8.4* 8.4*  MG  --  2.0  --  1.9 2.0 2.0 2.1   < > = values in this interval not displayed.   GFR: Estimated Creatinine Clearance (by C-G formula based on SCr of 1.34 mg/dL (H)) Female: 42 mL/min (A) Female: 51.4 mL/min (A) Liver Function Tests: Recent Labs  Lab 02/22/19 1855 02/23/19 0236  AST 18 18  ALT 13 13  ALKPHOS 37* 47  BILITOT 0.8 0.7  PROT 7.1 6.9  ALBUMIN 2.6* 2.6*   No results for input(s): LIPASE, AMYLASE in the last 168 hours. No results for input(s): AMMONIA in the last 168 hours. Coagulation Profile: No results for input(s): INR, PROTIME in the last 168 hours. Cardiac Enzymes: No results for input(s): CKTOTAL, CKMB, CKMBINDEX, TROPONINI in the last 168 hours. BNP (last 3 results) No results for input(s): PROBNP in the last 8760 hours. HbA1C: No results for input(s): HGBA1C in the last 72 hours. CBG: Recent Labs  Lab 02/24/19 0927 02/25/19 0724 02/26/19 0754 02/26/19 2046 02/27/19 0737  GLUCAP 183* 137* 169* 401* 170*   Lipid Profile: No results for input(s): CHOL, HDL, LDLCALC, TRIG, CHOLHDL, LDLDIRECT in the last 72 hours. Thyroid Function Tests: No results for input(s): TSH, T4TOTAL, FREET4, T3FREE, THYROIDAB in the last 72 hours. Anemia Panel: Recent Labs    02/26/19 0446  02/27/19 0240  FERRITIN 31 33   Sepsis Labs: Recent Labs  Lab 02/22/19 1722 02/23/19 0236 02/24/19 0305  PROCALCITON  --  <0.10 <0.10  LATICACIDVEN 1.3  --   --     Recent Results (from the past 240 hour(s))  SARS CORONAVIRUS 2 (TAT 6-24 HRS) Nasopharyngeal Nasopharyngeal Swab     Status: Abnormal   Collection Time: 02/22/19  4:27 PM   Specimen: Nasopharyngeal Swab  Result Value Ref Range Status   SARS Coronavirus 2 POSITIVE (A) NEGATIVE Final    Comment: RESULT CALLED TO, READ BACK BY AND VERIFIED WITH: D.YAO MD 2115 02/22/19 MCCORMICK K (NOTE) SARS-CoV-2 target nucleic acids are DETECTED. The SARS-CoV-2 RNA is generally detectable in upper and lower respiratory specimens during the acute phase of infection. Positive results are indicative of the presence of SARS-CoV-2 RNA. Clinical correlation with patient history and other diagnostic information is  necessary to determine patient infection status. Positive results do not rule out bacterial infection or co-infection with other  viruses.  The expected result is Negative. Fact Sheet for Patients: HairSlick.no Fact Sheet for Healthcare Providers: quierodirigir.com This test is not yet approved or cleared by the [place] FDA and  has been authorized for detection and/or diagnosis of SARS-CoV-2 by FDA under an Emergency Use Authorization (EUA). This EUA will remain  in effect (meaning this test can be used) for the  duration of the COVID-19 declaration under Section 564(b)(1) of the Act, 21 U.S.C. section 360bbb-3(b)(1), unless the authorization is terminated or revoked sooner. Performed at [facility], [address] [place], [place] [address]   Blood Culture (routine x 2)     Status: None (Preliminary result)   Collection Time: 02/22/19  7:09 PM   Specimen: BLOOD  Result Value Ref Range Status   Specimen Description BLOOD RIGHT ANTECUBITAL  Final    Special Requests   Final    BOTTLES DRAWN AEROBIC AND ANAEROBIC Blood Culture adequate volume   Culture   Final    NO GROWTH 4 DAYS Performed at [facility], [address], [place], [place] [address]    Report Status PENDING  Incomplete  Culture, Urine     Status: Abnormal   Collection Time: 02/22/19 10:41 PM   Specimen: Urine, Random  Result Value Ref Range Status   Specimen Description URINE, RANDOM  Final   Special Requests   Final    NONE Performed at [facility], [address], [place], [place] [address]    Culture (A)  Final    40,000 COLONIES/mL ESCHERICHIA COLI 40,000 COLONIES/mL KLEBSIELLA PNEUMONIAE Confirmed Extended Spectrum Beta-Lactamase Producer (ESBL).  In bloodstream infections from ESBL organisms, carbapenems are preferred over piperacillin/tazobactam. They are shown to have a lower risk of mortality.    Report Status 02/25/2019 FINAL  Final   Organism ID, Bacteria ESCHERICHIA COLI (A)  Final   Organism ID, Bacteria KLEBSIELLA PNEUMONIAE (A)  Final      Susceptibility   Escherichia coli - MIC*    AMPICILLIN >=32 RESISTANT Resistant     CEFAZOLIN >=64 RESISTANT Resistant     CEFTRIAXONE >=64 RESISTANT Resistant     CIPROFLOXACIN <=0.25 SENSITIVE Sensitive     GENTAMICIN >=16 RESISTANT Resistant     IMIPENEM <=0.25 SENSITIVE Sensitive     NITROFURANTOIN <=16 SENSITIVE Sensitive     TRIMETH/SULFA >=320 RESISTANT Resistant     AMPICILLIN/SULBACTAM >=32 RESISTANT Resistant     PIP/TAZO 8 SENSITIVE Sensitive     * 40,000 COLONIES/mL ESCHERICHIA COLI   Klebsiella pneumoniae - MIC*    AMPICILLIN >=32 RESISTANT Resistant     CEFAZOLIN >=64 RESISTANT Resistant     CEFTRIAXONE >=64 RESISTANT Resistant     CIPROFLOXACIN 0.5 SENSITIVE Sensitive     GENTAMICIN <=1 SENSITIVE Sensitive     IMIPENEM <=0.25 SENSITIVE Sensitive     NITROFURANTOIN 128 RESISTANT Resistant     TRIMETH/SULFA >=320 RESISTANT Resistant     AMPICILLIN/SULBACTAM >=32 RESISTANT  Resistant     PIP/TAZO 8 SENSITIVE Sensitive     * 40,000 COLONIES/mL KLEBSIELLA PNEUMONIAE  Blood Culture (routine x 2)     Status: None (Preliminary result)   Collection Time: 02/23/19  2:16 AM   Specimen: BLOOD LEFT HAND  Result Value Ref Range Status   Specimen Description BLOOD LEFT HAND  Final   Special Requests   Final    AEROBIC BOTTLE ONLY Blood Culture results may not be optimal due to an inadequate volume of blood received in culture  bottles   Culture   Final    NO GROWTH 3 DAYS Performed at [facility], [address] [place], [place] [address]    Report Status PENDING  Incomplete         Radiology Studies: No results found.      Scheduled Meds: . amLODipine  5 mg Oral Daily  . apixaban  5 mg Oral BID  . ascorbic acid  500 mg Oral BID  . fluticasone  2 spray Each Nare Daily  . gabapentin  100 mg Oral TID  . guaiFENesin  1,200 mg Oral BID  . Ipratropium-Albuterol  2 puff Inhalation BID  . loratadine  10 mg Oral Daily  . mouth rinse  15 mL Mouth Rinse BID  . methylPREDNISolone (SOLU-MEDROL) injection  60 mg Intravenous Q12H  . metoprolol tartrate  25 mg Oral Once  . metoprolol tartrate  50 mg Oral BID  . pantoprazole  40 mg Oral Q0600  . rosuvastatin  10 mg Oral Daily  . senna  1 tablet Oral BID  . sodium chloride flush  3 mL Intravenous Q12H  . triamcinolone cream   Topical BID  . zinc sulfate  220 mg Oral Daily   Continuous Infusions:    LOS: 5 days   Time spent= 35 mins    Clytie Shetley Joline Maxcy, MD [facility]  If 7PM-7AM, please contact night-coverage  02/27/2019, 11:12 AM

## 2019-02-27 NOTE — Progress Notes (Signed)
  Speech Language Pathology Treatment: Dysphagia  Patient Details Name: [name] [name] MRN: [id number] DOB: Feb 08, 1950 Today's Date: 02/27/2019 Time: 5456-2563 SLP Time Calculation (min) (ACUTE ONLY): 8 min  Assessment / Plan / Recommendation Clinical Impression  This therapist was able to communicate with pt in [place] for basic information re: swallow. She relayed she is coughing but not with liquids. Multiple straw sips water and solid trials consumed without difficulty. Respirations stable. She did not need cueing. Continue regular solids and thin liquids. D/C ST.   HPI HPI: 69 year old with history of HTN, chronic knee pain, CKD stage II came to the ER with complaints of worsening shortness of breath and cough for 7 days.  She called her PCP who sent her to the hospital for further evaluation.  Hypoxia down to 88% on room air in the ER per admitting provider.  Chest x-ray showing bilateral airspace infiltrate therefore admitted to the hospital.      SLP Plan  All goals met;Discharge SLP treatment due to (comment)       Recommendations  Diet recommendations: Regular;Thin liquid Liquids provided via: Cup;Straw Medication Administration: Whole meds with liquid Supervision: Patient able to self feed Compensations: Slow rate;Small sips/bites Postural Changes and/or Swallow Maneuvers: Seated upright 90 degrees                Oral Care Recommendations: Oral care BID Follow up Recommendations: None SLP Visit Diagnosis: Dysphagia, unspecified (R13.10) Plan: All goals met;Discharge SLP treatment due to (comment)       GO                Houston Siren 02/27/2019, 11:13 AM  Orbie Pyo Colvin Caroli.Ed Risk analyst [phone] Office [phone]

## 2019-02-27 NOTE — Care Management (Signed)
CM acknowledges consult for [name] [name] cost.  Pt is uninsured so if she does not obtain medication assistance cost will be >$400.  Pt is active with [facility] so she can request assistance during follow up visit.  TOC pharmacy will fill discharge medications - first 30 days of [name] [name] will be given free.  TOC will continue to follow

## 2019-02-27 NOTE — Progress Notes (Addendum)
Occupational Therapy Treatment Patient Details Name: [name] [name] MRN: [id number] DOB: [birth year] Today's Date: 02/27/2019    History of present illness Pt is a 69 y.o. female admitted 02/22/19 with worsening SOB and cough; (+) COVID-19. CXR indicated Bilateral airspace disease; favor congestive heart failure and edema over PNA. 02/25/19 developed afib with RVR PMH includes  HTN, chronic knee pain, CKD II.   OT comments  Pt making gradual progress towards OT goals. Pt completing room level mobility overall with close minguard-minA (+2 utilized for equipment/safety). Pt insistent not to use DME for mobility tasks and is able to do so, however feel pt will benefit from use of DME for increased stability, safety and to reduce bil knee pain. Despite education on benefits of using DME question pt's receptiveness to this option - will continue efforts. Pt completing toileting and x2 standing grooming ADL during session with minguard-minA throughout. Pt with new afib this admission with resting HR fluctuating 90-127, with activity HR up to 146; SpO2 >90% on RA. Feel POC remains appropriate at this time. Will continue to follow acutely.  Stratus audio interpreter utilized during session.   Follow Up Recommendations  Home health OT;Supervision - Intermittent    Equipment Recommendations  Tub/shower seat          Precautions / Restrictions Precautions Precautions: Fall Restrictions Weight Bearing Restrictions: No       Mobility Bed Mobility               General bed mobility comments: OOB in recliner   Transfers Overall transfer level: Needs assistance Equipment used: None Transfers: Sit to/from Stand;Stand Pivot Transfers Sit to Stand: Min guard         General transfer comment: min guard for power up and steadying    Balance Overall balance assessment: Needs assistance Sitting-balance support: Feet supported Sitting balance-Leahy Scale: Good     Standing  balance support: No upper extremity supported;During functional activity Standing balance-Leahy Scale: Good                             ADL either performed or assessed with clinical judgement   ADL Overall ADL's : Needs assistance/impaired     Grooming: Min guard;Minimal assistance;Wash/dry hands;Oral care;Standing Grooming Details (indicate cue type and reason): standing at sink, pt intermittently bending over and using bil forearms for support on sink ledge                  Toilet Transfer: Minimal assistance;+2 for safety/equipment;Ambulation   Toileting- Clothing Manipulation and Hygiene: Min guard;Sit to/from stand;Sitting/lateral lean Toileting - Clothing Manipulation Details (indicate cue type and reason): pt able to perform pericare and clothing management of gown/underwear with close guard for safety     Functional mobility during ADLs: Minimal assistance;+2 for safety/equipment General ADL Comments: pt continues to present with unsteadiness, knee pain; educated on benefit of using RW to increase stability and distance pt able to mobilize safely though unsure of pt understanding/receptiveness to education                        Cognition Arousal/Alertness: Awake/alert Behavior During Therapy: WFL for tasks assessed/performed Overall Cognitive Status: Difficult to assess                                 General Comments: utilized Writer #[email or web address];  pt motivated and excited to attempt mobility without DME, however question pt's understanding of improved stability/safety with use of DME when educated         Exercises     Shoulder Instructions       General Comments HR at rest 90-127bpm, with activity HR max 148bpm, Increased time for all tasks due to use of interpreter     Pertinent Vitals/ Pain       Pain Assessment: Faces Faces Pain Scale: Hurts little more Pain Location: bil knees and back Pain  Descriptors / Indicators: Aching;Discomfort;Sore Pain Intervention(s): Limited activity within patient's tolerance;Monitored during session;Repositioned  Home Living                                          Prior Functioning/Environment              Frequency  Min 2X/week        Progress Toward Goals  OT Goals(current goals can now be found in the care plan section)  Progress towards OT goals: Progressing toward goals  Acute Rehab OT Goals Patient Stated Goal: home soon OT Goal Formulation: With patient Time For Goal Achievement: 03/11/19 Potential to Achieve Goals: Good  Plan Discharge plan remains appropriate    Co-evaluation    PT/OT/SLP Co-Evaluation/Treatment: Yes Reason for Co-Treatment: For patient/therapist safety;To address functional/ADL transfers   OT goals addressed during session: ADL's and self-care      AM-PAC OT "6 Clicks" Daily Activity     Outcome Measure   Help from another person eating meals?: None Help from another person taking care of personal grooming?: A Little Help from another person toileting, which includes using toliet, bedpan, or urinal?: A Little Help from another person bathing (including washing, rinsing, drying)?: A Little Help from another person to put on and taking off regular upper body clothing?: None Help from another person to put on and taking off regular lower body clothing?: A Little 6 Click Score: 20    End of Session    OT Visit Diagnosis: Other abnormalities of gait and mobility (R26.89);Muscle weakness (generalized) (M62.81)   Activity Tolerance Patient tolerated treatment well   Patient Left in chair;with call bell/phone within reach   Nurse Communication Mobility status        Time: 8676-7209 OT Time Calculation (min): 36 min  Charges: OT General Charges $OT Visit: 1 Visit OT Treatments $Self Care/Home Management : 8-22 mins  Lou Cal, OT Supplemental Rehabilitation  Services Pager [phone] Office [phone]   Raymondo Band 02/27/2019, 5:19 PM

## 2019-02-28 ENCOUNTER — Telehealth: Payer: Self-pay

## 2019-02-28 ENCOUNTER — Other Ambulatory Visit: Payer: Self-pay

## 2019-02-28 ENCOUNTER — Inpatient Hospital Stay (HOSPITAL_COMMUNITY): Payer: HRSA Program

## 2019-02-28 DIAGNOSIS — I361 Nonrheumatic tricuspid (valve) insufficiency: Secondary | ICD-10-CM

## 2019-02-28 DIAGNOSIS — I10 Essential (primary) hypertension: Secondary | ICD-10-CM

## 2019-02-28 DIAGNOSIS — I340 Nonrheumatic mitral (valve) insufficiency: Secondary | ICD-10-CM

## 2019-02-28 DIAGNOSIS — I4891 Unspecified atrial fibrillation: Secondary | ICD-10-CM

## 2019-02-28 LAB — BASIC METABOLIC PANEL
Anion gap: 8 (ref 5–15)
BUN: 37 mg/dL — ABNORMAL HIGH (ref 8–23)
CO2: 21 mmol/L — ABNORMAL LOW (ref 22–32)
Calcium: 8.5 mg/dL — ABNORMAL LOW (ref 8.9–10.3)
Chloride: 106 mmol/L (ref 98–111)
Creatinine, Ser: 1.51 mg/dL — ABNORMAL HIGH (ref 0.44–1.00)
GFR calc Af Amer: 40 mL/min — ABNORMAL LOW (ref >60)
GFR calc non Af Amer: 35 mL/min — ABNORMAL LOW (ref >60)
Glucose, Bld: 419 mg/dL — ABNORMAL HIGH (ref 70–99)
Potassium: 4.9 mmol/L (ref 3.5–5.1)
Sodium: 135 mmol/L (ref 135–145)

## 2019-02-28 LAB — T4, FREE: Free T4: 1.47 ng/dL — ABNORMAL HIGH (ref 0.61–1.12)

## 2019-02-28 LAB — FERRITIN: Ferritin: 33 ng/mL (ref 11–307)

## 2019-02-28 LAB — D-DIMER, QUANTITATIVE: D-Dimer, Quant: 1.52 ug/mL-FEU — ABNORMAL HIGH (ref 0.00–0.50)

## 2019-02-28 LAB — GLUCOSE, CAPILLARY
Glucose-Capillary: 310 mg/dL — ABNORMAL HIGH (ref 70–99)
Glucose-Capillary: 319 mg/dL — ABNORMAL HIGH (ref 70–99)
Glucose-Capillary: 337 mg/dL — ABNORMAL HIGH (ref 70–99)
Glucose-Capillary: 345 mg/dL — ABNORMAL HIGH (ref 70–99)
Glucose-Capillary: 363 mg/dL — ABNORMAL HIGH (ref 70–99)

## 2019-02-28 LAB — HEMOGLOBIN A1C
Hgb A1c MFr Bld: 6.2 % — ABNORMAL HIGH (ref 4.8–5.6)
Mean Plasma Glucose: 131.24 mg/dL

## 2019-02-28 LAB — CULTURE, BLOOD (ROUTINE X 2): Culture: NO GROWTH

## 2019-02-28 LAB — CBC
HCT: 36.7 % (ref 36.0–46.0)
Hemoglobin: 12.2 g/dL (ref 12.0–15.0)
MCH: 26.3 pg (ref 26.0–34.0)
MCHC: 33.2 g/dL (ref 30.0–36.0)
MCV: 79.3 fL — ABNORMAL LOW (ref 80.0–100.0)
Platelets: 271 10*3/uL (ref 150–400)
RBC: 4.63 MIL/uL (ref 3.87–5.11)
RDW: 17.1 % — ABNORMAL HIGH (ref 11.5–15.5)
WBC: 9.3 10*3/uL (ref 4.0–10.5)
nRBC: 0 % (ref 0.0–0.2)

## 2019-02-28 LAB — ECHOCARDIOGRAM LIMITED
Height: 64 in
Weight: 3231.06 oz

## 2019-02-28 LAB — C-REACTIVE PROTEIN: CRP: 1 mg/dL — ABNORMAL HIGH (ref <1.0)

## 2019-02-28 LAB — MAGNESIUM: Magnesium: 2.2 mg/dL (ref 1.7–2.4)

## 2019-02-28 MED ORDER — INSULIN ASPART 100 UNIT/ML ~~LOC~~ SOLN
0.0000 [IU] | Freq: Three times a day (TID) | SUBCUTANEOUS | Status: DC
  Administered 2019-02-28: 7 [IU] via SUBCUTANEOUS
  Administered 2019-02-28: 18:00:00 9 [IU] via SUBCUTANEOUS
  Administered 2019-03-01: 13:00:00 5 [IU] via SUBCUTANEOUS
  Administered 2019-03-01: 08:00:00 3 [IU] via SUBCUTANEOUS

## 2019-02-28 MED ORDER — PNEUMOCOCCAL VAC POLYVALENT 25 MCG/0.5ML IJ INJ
0.5000 mL | INJECTION | INTRAMUSCULAR | Status: AC
  Administered 2019-03-01: 10:00:00 0.5 mL via INTRAMUSCULAR
  Filled 2019-02-28: qty 0.5

## 2019-02-28 MED ORDER — INSULIN ASPART 100 UNIT/ML ~~LOC~~ SOLN
0.0000 [IU] | SUBCUTANEOUS | Status: AC
  Administered 2019-02-28: 10:00:00 7 [IU] via SUBCUTANEOUS

## 2019-02-28 MED ORDER — WARFARIN SODIUM 7.5 MG PO TABS: 7.5000 mg | ORAL_TABLET | Freq: Once | ORAL | Status: DC

## 2019-02-28 MED ORDER — DILTIAZEM HCL 60 MG PO TABS
30.0000 mg | ORAL_TABLET | Freq: Four times a day (QID) | ORAL | Status: DC
  Administered 2019-02-28 – 2019-03-05 (×19): 30 mg via ORAL
  Filled 2019-02-28 (×19): qty 1

## 2019-02-28 MED ORDER — INSULIN ASPART 100 UNIT/ML ~~LOC~~ SOLN
0.0000 [IU] | Freq: Every day | SUBCUTANEOUS | Status: DC
  Administered 2019-03-01: 01:00:00 4 [IU] via SUBCUTANEOUS

## 2019-02-28 MED ORDER — INFLUENZA VAC A&B SA ADJ QUAD 0.5 ML IM PRSY
0.5000 mL | PREFILLED_SYRINGE | INTRAMUSCULAR | Status: AC
  Administered 2019-03-01: 10:00:00 0.5 mL via INTRAMUSCULAR
  Filled 2019-02-28: qty 0.5

## 2019-02-28 MED ORDER — WARFARIN - PHARMACIST DOSING INPATIENT: Freq: Every day | Status: DC

## 2019-02-28 MED ORDER — APIXABAN 5 MG PO TABS
5.0000 mg | ORAL_TABLET | Freq: Two times a day (BID) | ORAL | Status: DC
  Administered 2019-02-28 – 2019-03-05 (×11): 5 mg via ORAL
  Filled 2019-02-28: qty 1
  Filled 2019-02-28: qty 2
  Filled 2019-02-28 (×9): qty 1

## 2019-02-28 MED ORDER — INSULIN GLARGINE 100 UNIT/ML ~~LOC~~ SOLN
5.0000 [IU] | Freq: Two times a day (BID) | SUBCUTANEOUS | Status: DC
  Administered 2019-03-01 (×2): 5 [IU] via SUBCUTANEOUS
  Filled 2019-02-28 (×4): qty 0.05

## 2019-02-28 MED ORDER — INSULIN ASPART 100 UNIT/ML ~~LOC~~ SOLN: 4.0000 [IU] | Freq: Three times a day (TID) | SUBCUTANEOUS | Status: DC

## 2019-02-28 NOTE — Progress Notes (Signed)
Physical Therapy Treatment Patient Details Name: [name] [name] MRN: [id number] DOB: [birth year] Today's Date: 02/28/2019    History of Present Illness Pt is a 69 y.o. female admitted 02/22/19 with worsening SOB and cough; (+) COVID-19. CXR indicated Bilateral airspace disease; favor congestive heart failure and edema over PNA. 02/25/19 developed afib with RVR PMH includes  HTN, chronic knee pain, CKD II.    PT Comments    Utilized Marketing executive on iPad during session. Pt reports that she is having an ultrasound of her heart tomorrow. Pt reports she would like to practice walking. Pt is mod I for bed mobility, and min guard for transfers and ambulation. Pt only walked 5 feet away from the bed and her HR increased to 178bpm. Pt turned around and returned to bed. Supine back in bed HR returned to 110s. Once medically stable, d/c plans remain appropriate. PT will continue to follow acutely.   Follow Up Recommendations  Home health PT;Supervision - Intermittent           Precautions / Restrictions Precautions Precautions: Fall Restrictions Weight Bearing Restrictions: No    Mobility  Bed Mobility Overal bed mobility: Modified Independent             General bed mobility comments: use of bed rail to pull to EoB  Transfers Overall transfer level: Needs assistance Equipment used: None Transfers: Sit to/from Stand;Stand Pivot Transfers Sit to Stand: Min guard         General transfer comment: min guard for power up and steadying  Ambulation/Gait Ambulation/Gait assistance: Min guard Gait Distance (Feet): 10 Feet Assistive device: None Gait Pattern/deviations: Step-through pattern;Decreased stride length;Trunk flexed Gait velocity: slowed Gait velocity interpretation: <1.31 ft/sec, indicative of household ambulator General Gait Details: slow, unsteady steps, limited by increase in HR       Balance Overall balance assessment: Needs  assistance Sitting-balance support: Feet supported Sitting balance-Leahy Scale: Good     Standing balance support: No upper extremity supported;During functional activity Standing balance-Leahy Scale: Good                              Cognition Arousal/Alertness: Awake/alert Behavior During Therapy: WFL for tasks assessed/performed Overall Cognitive Status: Within Functional Limits for tasks assessed                                 General Comments: utilized Paramedic Comments General comments (skin integrity, edema, etc.): HR 110s on entry, with steps away from bed increased 150s-178bpm, returned to bed       Pertinent Vitals/Pain Pain Assessment: Faces Faces Pain Scale: Hurts a little bit Pain Location: bil knees and back Pain Descriptors / Indicators: Aching;Discomfort;Sore Pain Intervention(s): Monitored during session           PT Goals (current goals can now be found in the care plan section) Acute Rehab PT Goals Patient Stated Goal: home soon PT Goal Formulation: With patient Time For Goal Achievement: 03/09/19 Potential to Achieve Goals: Good Progress towards PT goals: Not progressing toward goals - comment(limited by afib HR 178bpm)    Frequency    Min 3X/week      PT Plan Discharge plan needs to be updated       AM-PAC PT "6 Clicks" Mobility   Outcome Measure  Help needed turning from your back to your side while in a flat bed without using bedrails?: None Help needed moving from lying on your back to sitting on the side of a flat bed without using bedrails?: None Help needed moving to and from a bed to a chair (including a wheelchair)?: None Help needed standing up from a chair using your arms (e.g., wheelchair or bedside chair)?: None Help needed to walk in hospital room?: None Help needed climbing 3-5 steps with a railing? : A Lot 6 Click Score: 22    End of Session  Equipment Utilized During Treatment: Gait belt Activity Tolerance: Patient tolerated treatment well Patient left: in chair;with call bell/phone within reach Nurse Communication: Mobility status PT Visit Diagnosis: Other abnormalities of gait and mobility (R26.89)     Time: 1459-1520 PT Time Calculation (min) (ACUTE ONLY): 21 min  Charges:  $Gait Training: 8-22 mins                     Miklos Bidinger B. Beverely Risen PT, DPT Acute Rehabilitation Services Pager [phone] Office [phone]    Elon Alas Fleet 02/28/2019, 4:03 PM

## 2019-02-28 NOTE — Progress Notes (Signed)
ANTICOAGULATION CONSULT NOTE - Initial Consult  Pharmacy Consult for Eliquis>>Coumadin Indication: atrial fibrillation  No Known Allergies  Patient Measurements: Height: 5\' 4"  (162.6 cm) Weight: 201 lb 15.1 oz (91.6 kg) IBW/kg (Calculated) : 54.7  Vital Signs: Temp: 97.5 F (36.4 C) (01/28 0800) Temp Source: Oral (01/28 0800) BP: 109/88 (01/28 0800) Pulse Rate: 79 (01/28 0800)  Labs: Recent Labs    02/26/19 0446 02/26/19 0446 02/27/19 0240 02/28/19 0324  HGB 11.3*   < > 11.6* 12.2  HCT 34.4*  --  34.9* 36.7  PLT 242  --  271 271  CREATININE 1.11*  --  1.34* 1.51*   < > = values in this interval not displayed.    Estimated Creatinine Clearance (by C-G formula based on SCr of 1.51 mg/dL (H)) Female: 38 mL/min (A) Female: 46.5 mL/min (A)   Medical History: Past Medical History:  Diagnosis Date  . Hypertension     Assessment:  Anticoag: d-dimer 1.52 down. BMI 33.  LMWH incr. to 90mg /12h after going into afib. CHADS2VASC 3. Change to Eliquis 1/27>>warfarin 1/28.  CBC stable.   Goal of Therapy:  Therapeutic oral anticoagulation   Plan:  D/c Eliqius $$$ Start warfarin 7.5mg  po x 1 tonight. Daily INR Needs improved glucose control - Watch rising Scr  Printed education in 07-22-1968 tubed up for patient.   Mona Ayars S. 2/28, PharmD, BCPS Clinical Staff Pharmacist [email or web address] [name], Merilynn Finland 02/28/2019,9:30 AM

## 2019-02-28 NOTE — Care Management (Signed)
CM contacted by [facility] liaison.  Clinic requests that pt be discharged home on Eliquis if appropriate as this will eliminate the need for frequent INR checks and pts maximum cost will be $10.  CM discussed this with pts guardian Ms Katrinka Blazing and she is in agreement.

## 2019-02-28 NOTE — Progress Notes (Signed)
Inpatient Diabetes Program Recommendations  AACE/ADA: New Consensus Statement on Inpatient Glycemic Control (2015)  Target Ranges:  Prepandial:   less than 140 mg/dL      Peak postprandial:   less than 180 mg/dL (1-2 hours)      Critically ill patients:  140 - 180 mg/dL   Lab Results  Component Value Date   GLUCAP 337 (H) 02/28/2019   HGBA1C 6.2 (H) 02/28/2019    Review of Glycemic Control Results for DURGA, SALDARRIAGA (MRN [id number]) as of 02/28/2019 15:49  Ref. Range 02/26/2019 20:46 02/27/2019 07:37 02/27/2019 12:16 02/28/2019 08:38 02/28/2019 12:18  Glucose-Capillary Latest Ref Range: 70 - 99 mg/dL 694 (H) 854 (H) 627 (H) 319 (H) 337 (H)   Diabetes history: None Outpatient Diabetes medications: None Current orders for Inpatient glycemic control:  Novolog sensitive tid with meals and HS Solumedrol 60 mg IV q 12 hours Inpatient Diabetes Program Recommendations:   While on steroids, consider adding Levemir 7 units bid.   Thanks  Beryl Meager, RN, BC-ADM Inpatient Diabetes Coordinator Pager [phone] (8a-5p)

## 2019-02-28 NOTE — Progress Notes (Signed)
  Echocardiogram 2D Echocardiogram has been performed.  Gerda Diss 02/28/2019, 4:48 PM

## 2019-02-28 NOTE — Progress Notes (Addendum)
PROGRESS NOTE  [name] [name] JEH:[id number] DOB: Feb 23, 1950 DOA: 02/22/2019 PCP: Cain Saupe, MD  HPI/Recap of past 51 hours: 69 year old with history of HTN, chronic knee pain, CKD stage II came to the ER with complaints of worsening shortness of breath and cough for 7 days.  She called her PCP who sent her to the hospital for further evaluation.  Hypoxia down to 88% on room air in the ER per admitting provider.  Chest x-ray showing bilateral airspace infiltrate therefore admitted to the hospital.  Started on routine treatment with remdesivir and Solu-Medrol, slowly weaned off oxygen.  Intermittently received Lasix which improved her symptoms as well.  Went into atrial fibrillation with RVR on 02/26/2019.  She was started on beta-blocker and anticoagulation.  02/28/19: Seen and examined.  States she still feels weak but better than prior.  Pleuritic pain and cough are also improved.  Assessment/Plan: Principal Problem:   Acute respiratory failure with hypoxia (HCC) Active Problems:   Hypertension   Knee osteoarthritis   Essential hypertension, benign   Flu-like symptoms   COVID-19   Atrial fibrillation with RVR (HCC)   Acute hypoxic respiratory failure, 2 L nasal cannula COVID-19 pneumonia -Oxygen levels-94% room Air -Remdesivir-completed 5 days of remdesivir on 02/27/2019. -Solumedrol-D7 -Routine: Labs have been reviewed including ferritin, LDH, CRP, d-dimer, fibrinogen.  Will need to trend this lab daily. -Vitamin C & Zinc. Prone >16hrs/day.  -procalcitonin-negative, BNP 190 -Chest x-ray-independently reviewed chest x-ray which showed bilateral pulmonary infiltrates.   -continue supportive care-antitussive, inhalers, I-S/flutter -CODE STATUS confirmed full code. Out of bed to chair.  Mobilize with assistance and fall precautions. OT/PT- Recommend HH OT  Newly diagnosed atrial fibrillation with RVR CHADs2VAsc 3 Continue metoprolol to 50 mg twice daily.   Started  on po diltiazem per cardiology.  Will transition to long acting tomorrow. Assistance provided for Eliquis. -Continue to monitor electrolytes and replete as deemed appropriate. -Obtained 2D echo, limited. -Cardiology consulted, Dr. Mayford Knife, following.  Hyperglycemia with no prior history of diabetes Likely exacerbated by steroid use Hemoglobin A1c 6.2 on 02/28/19 Start insulin sliding scale, sensitive Continue to monitor CBG Continue carb modified diet  Essential hypertension Blood pressure is at goal -Norvasc 5 mg daily  AKI on CKD 2  Baseline creatinine appears to be 1.0 with GFR greater than 60 Today her creatinine is 1.51 with GFR of 40. Avoid nephrotoxins Monitor urine output Continue BMP in the morning  Peripheral neuropathy -Gabapentin 3 times daily  Hyperlipidemia -Crestor  Bilateral knee osteoarthritis -Pain control.  DVT prophylaxis: Eliquis  Code Status: Full code    Family Communication: . Discussed with her friend Ms. Smith via phone [phone].    Disposition Plan:  Patient is from home.  Anticipate discharge to home in the next 24 to 48 hours once cardiology signs off.  Barrier to discharge: New cardiac medications for rate control with ongoing adjustments.    Consult: Cardiology on 02/28/2019.  Objective: Vitals:   02/28/19 0530 02/28/19 0600 02/28/19 0800 02/28/19 1100  BP: 105/79  109/88   Pulse: 64 78 79 67  Resp: (!) 28 18 16 20   Temp:   (!) 97.5 F (36.4 C)   TempSrc:   Oral   SpO2: 93% 93% 94% 94%  Weight:      Height:        Intake/Output Summary (Last 24 hours) at 02/28/2019 1452 Last data filed at 02/28/2019 0610 Gross per 24 hour  Intake 249 ml  Output 300 ml  Net -51  ml   Filed Weights   02/24/19 1100 02/28/19 0500  Weight: 88.1 kg 91.6 kg    Exam:  . General: 69 y.o. year-old adult well developed well nourished in no acute distress.  Alert and oriented x3. . Cardiovascular: Regular rate and rhythm with no rubs or  gallops.  No thyromegaly or JVD noted.   Marland Kitchen Respiratory: Mild rales at bases.  No wheezing noted.  Good inspiratory effort.  Abdomen: Soft nontender nondistended with normal bowel sounds x4 quadrants. . Musculoskeletal: No lower extremity edema. 2/4 pulses in all 4 extremities. Marland Kitchen Psychiatry: Mood is appropriate for condition and setting   Data Reviewed: CBC: Recent Labs  Lab 02/24/19 0305 02/25/19 0719 02/26/19 0446 02/27/19 0240 02/28/19 0324  WBC 8.8 8.8 9.2 11.5* 9.3  HGB 11.2* 11.6* 11.3* 11.6* 12.2  HCT 34.4* 35.4* 34.4* 34.9* 36.7  MCV 80.2 80.3 79.6* 78.6* 79.3*  PLT 224 259 242 271 440   Basic Metabolic Panel: Recent Labs  Lab 02/24/19 0305 02/25/19 0719 02/26/19 0446 02/27/19 0240 02/28/19 0324  NA 139 140 140 137 135  K 4.7 4.9 4.8 4.6 4.9  CL 104 105 109 106 106  CO2 24 23 23 22  21*  GLUCOSE 134* 155* 187* 295* 419*  BUN 19 28* 29* 36* 37*  CREATININE 1.01* 1.26* 1.11* 1.34* 1.51*  CALCIUM 8.7* 8.8* 8.4* 8.4* 8.5*  MG 1.9 2.0 2.0 2.1 2.2   GFR: Estimated Creatinine Clearance (by C-G formula based on SCr of 1.51 mg/dL (H)) Female: 38 mL/min (A) Female: 46.5 mL/min (A) Liver Function Tests: Recent Labs  Lab 02/22/19 1855 02/23/19 0236  AST 18 18  ALT 13 13  ALKPHOS 37* 47  BILITOT 0.8 0.7  PROT 7.1 6.9  ALBUMIN 2.6* 2.6*   No results for input(s): LIPASE, AMYLASE in the last 168 hours. No results for input(s): AMMONIA in the last 168 hours. Coagulation Profile: No results for input(s): INR, PROTIME in the last 168 hours. Cardiac Enzymes: No results for input(s): CKTOTAL, CKMB, CKMBINDEX, TROPONINI in the last 168 hours. BNP (last 3 results) No results for input(s): PROBNP in the last 8760 hours. HbA1C: Recent Labs    02/28/19 0324  HGBA1C 6.2*   CBG: Recent Labs  Lab 02/26/19 2046 02/27/19 0737 02/27/19 1216 02/28/19 0838 02/28/19 1218  GLUCAP 401* 170* 291* 319* 337*   Lipid Profile: No results for input(s): CHOL, HDL, LDLCALC,  TRIG, CHOLHDL, LDLDIRECT in the last 72 hours. Thyroid Function Tests: Recent Labs    02/27/19 0240 02/28/19 0324  TSH 0.080*  --   FREET4  --  1.47*   Anemia Panel: Recent Labs    02/27/19 0240 02/28/19 0324  FERRITIN 33 33   Urine analysis:    Component Value Date/Time   COLORURINE YELLOW 02/22/2019 2241   APPEARANCEUR CLEAR 02/22/2019 2241   LABSPEC 1.008 02/22/2019 2241   PHURINE 8.0 02/22/2019 2241   GLUCOSEU NEGATIVE 02/22/2019 2241   HGBUR NEGATIVE 02/22/2019 2241   BILIRUBINUR NEGATIVE 02/22/2019 2241   KETONESUR NEGATIVE 02/22/2019 2241   PROTEINUR NEGATIVE 02/22/2019 2241   NITRITE NEGATIVE 02/22/2019 2241   LEUKOCYTESUR NEGATIVE 02/22/2019 2241   Sepsis Labs: @LABRCNTIP (procalcitonin:4,lacticidven:4)  ) Recent Results (from the past 240 hour(s))  SARS CORONAVIRUS 2 (TAT 6-24 HRS) Nasopharyngeal Nasopharyngeal Swab     Status: Abnormal   Collection Time: 02/22/19  4:27 PM   Specimen: Nasopharyngeal Swab  Result Value Ref Range Status   SARS Coronavirus 2 POSITIVE (A) NEGATIVE Final  Comment: RESULT CALLED TO, READ BACK BY AND VERIFIED WITH: D.YAO MD 2115 02/22/19 MCCORMICK K (NOTE) SARS-CoV-2 target nucleic acids are DETECTED. The SARS-CoV-2 RNA is generally detectable in upper and lower respiratory specimens during the acute phase of infection. Positive results are indicative of the presence of SARS-CoV-2 RNA. Clinical correlation with patient history and other diagnostic information is  necessary to determine patient infection status. Positive results do not rule out bacterial infection or co-infection with other viruses.  The expected result is Negative. Fact Sheet for Patients: HairSlick.no Fact Sheet for Healthcare Providers: quierodirigir.com This test is not yet approved or cleared by the [place] FDA and  has been authorized for detection and/or diagnosis of SARS-CoV-2 by FDA under  an Emergency Use Authorization (EUA). This EUA will remain  in effect (meaning this test can be used) for the  duration of the COVID-19 declaration under Section 564(b)(1) of the Act, 21 U.S.C. section 360bbb-3(b)(1), unless the authorization is terminated or revoked sooner. Performed at [facility], [address], [place], [place] [address]   Blood Culture (routine x 2)     Status: None   Collection Time: 02/22/19  7:09 PM   Specimen: BLOOD  Result Value Ref Range Status   Specimen Description BLOOD RIGHT ANTECUBITAL  Final   Special Requests   Final    BOTTLES DRAWN AEROBIC AND ANAEROBIC Blood Culture adequate volume   Culture   Final    NO GROWTH 5 DAYS Performed at [facility], [address], [place], [place] [address]    Report Status 02/27/2019 FINAL  Final  Culture, Urine     Status: Abnormal   Collection Time: 02/22/19 10:41 PM   Specimen: Urine, Random  Result Value Ref Range Status   Specimen Description URINE, RANDOM  Final   Special Requests   Final    NONE Performed at [facility], [address], [place], [place] [address]    Culture (A)  Final    40,000 COLONIES/mL ESCHERICHIA COLI 40,000 COLONIES/mL KLEBSIELLA PNEUMONIAE Confirmed Extended Spectrum Beta-Lactamase Producer (ESBL).  In bloodstream infections from ESBL organisms, carbapenems are preferred over piperacillin/tazobactam. They are shown to have a lower risk of mortality.    Report Status 02/25/2019 FINAL  Final   Organism ID, Bacteria ESCHERICHIA COLI (A)  Final   Organism ID, Bacteria KLEBSIELLA PNEUMONIAE (A)  Final      Susceptibility   Escherichia coli - MIC*    AMPICILLIN >=32 RESISTANT Resistant     CEFAZOLIN >=64 RESISTANT Resistant     CEFTRIAXONE >=64 RESISTANT Resistant     CIPROFLOXACIN <=0.25 SENSITIVE Sensitive     GENTAMICIN >=16 RESISTANT Resistant     IMIPENEM <=0.25 SENSITIVE Sensitive     NITROFURANTOIN <=16 SENSITIVE Sensitive     TRIMETH/SULFA >=320  RESISTANT Resistant     AMPICILLIN/SULBACTAM >=32 RESISTANT Resistant     PIP/TAZO 8 SENSITIVE Sensitive     * 40,000 COLONIES/mL ESCHERICHIA COLI   Klebsiella pneumoniae - MIC*    AMPICILLIN >=32 RESISTANT Resistant     CEFAZOLIN >=64 RESISTANT Resistant     CEFTRIAXONE >=64 RESISTANT Resistant     CIPROFLOXACIN 0.5 SENSITIVE Sensitive     GENTAMICIN <=1 SENSITIVE Sensitive     IMIPENEM <=0.25 SENSITIVE Sensitive     NITROFURANTOIN 128 RESISTANT Resistant     TRIMETH/SULFA >=320 RESISTANT Resistant     AMPICILLIN/SULBACTAM >=32 RESISTANT Resistant     PIP/TAZO 8 SENSITIVE Sensitive     *  40,000 COLONIES/mL KLEBSIELLA PNEUMONIAE  Blood Culture (routine x 2)     Status: None   Collection Time: 02/23/19  2:16 AM   Specimen: BLOOD LEFT HAND  Result Value Ref Range Status   Specimen Description BLOOD LEFT HAND  Final   Special Requests   Final    AEROBIC BOTTLE ONLY Blood Culture results may not be optimal due to an inadequate volume of blood received in culture bottles   Culture   Final    NO GROWTH 5 DAYS Performed at [facility], [address], [place], [place] [address]    Report Status 02/28/2019 FINAL  Final      Studies: No results found.  Scheduled Meds: . apixaban  5 mg Oral BID  . ascorbic acid  500 mg Oral BID  . diltiazem  30 mg Oral Q6H  . fluticasone  2 spray Each Nare Daily  . gabapentin  100 mg Oral TID  . guaiFENesin  1,200 mg Oral BID  . insulin aspart  0-5 Units Subcutaneous QHS  . insulin aspart  0-9 Units Subcutaneous TID WC  . Ipratropium-Albuterol  2 puff Inhalation BID  . loratadine  10 mg Oral Daily  . mouth rinse  15 mL Mouth Rinse BID  . methylPREDNISolone (SOLU-MEDROL) injection  60 mg Intravenous Q12H  . metoprolol tartrate  50 mg Oral BID  . pantoprazole  40 mg Oral Q0600  . rosuvastatin  10 mg Oral Daily  . senna  1 tablet Oral BID  . sodium chloride flush  3 mL Intravenous Q12H  . triamcinolone cream   Topical BID  . zinc  sulfate  220 mg Oral Daily    Continuous Infusions:   LOS: 6 days     Darlin Drop, MD [facility] Pager [phone]  If 7PM-7AM, please contact night-coverage [email or web address] Password [facility] 02/28/2019, 2:52 PM

## 2019-02-28 NOTE — Progress Notes (Signed)
   02/27/19 2030  MEWS Score  Pulse Rate 95  ECG Heart Rate (!) 144  Resp (!) 22  SpO2 92 %  O2 Device Room Air  Patient Activity (if Appropriate) In bed  MEWS Score  MEWS Temp 0  MEWS Systolic 1  MEWS Pulse 3  MEWS RR 1  MEWS LOC 0  MEWS Score 5  MEWS Score Color Red  MEWS Assessment  Is this an acute change? No  Provider Notification  Provider Name/Title M. Katherina Right, NP  Date Provider Notified 02/27/19  Time Provider Notified 2128  Notification Type Page  Notification Reason Other (Comment) (HR 120s-140s, Resp 20s, BP 97/69. Patient resting in bed.)  Response No new orders  Date of Provider Response 02/27/19  Time of Provider Response 2146   M. Katherina Right, NP notified of RED MEWS. Patient given HS Metoprolol after repeat BP 99/75 in right arm and 109/76 in left arm. Patient denies dizziness. No distress noted. Will continue to monitor.

## 2019-02-28 NOTE — Discharge Instructions (Addendum)

## 2019-02-28 NOTE — TOC Initial Note (Addendum)
Transition of Care [facility]) - Initial/Assessment Note    Patient Details  Name: [name] [name] MRN: [id number] Date of Birth: [birth year]  [facility] CM/SW Contact:    Cherylann Parr, RN Phone Number: 02/28/2019, 2:57 PM  Clinical Narrative:  CM was unable to reach pt via phone.  CM was able to speak with pts appointed legal guardian Ms [name] [name].  Pt is from home with her friend [name] [name].  Per guardian pt will have the intermittent supervision recommend.  Pts guardian transports pt where she needs to go.  Pt is already established with [facility] and gets medication assistance.  Ms [name] [name] will take pt home and to clinic for meds at discharge.  Pt will not be able to get DME via charity due to undocumented status.  TOC will continue to follow        CM discussed pt with TCC liaison Erskine Squibb - pt has a very strong support system in the community.  Pt is associated with a local refugee program - program provides all of pts needs.           Expected Discharge Plan: Home w Home Health Services     Patient Goals and CMS Choice     Choice offered to / list presented to : (legal guardian)  Expected Discharge Plan and Services Expected Discharge Plan: Home w Home Health Services       Living arrangements for the past 2 months: Single Family Home Expected Discharge Date: 02/28/19                         HH Arranged: PT, OT HH Agency: [facility] Date [facility] Agency Contacted: 02/28/19 Time HH Agency Contacted: 1455 Representative spoke with at [facility] Agency: cory  Prior Living Arrangements/Services Living arrangements for the past 2 months: Single Family Home Lives with:: Friends(she stays with a friend names [name] [name]) Patient language and need for interpreter reviewed:: Yes(Pt speaks [name])        Need for Family Participation in Patient Care: (Support of friend Financial risk analyst and legal guardian Ms [name] [name]) Care giver support system in place?: Yes (comment)   Criminal  Activity/Legal Involvement Pertinent to Current Situation/Hospitalization: No - Comment as needed  Activities of Daily Living Home Assistive Devices/Equipment: None ADL Screening (condition at time of admission) Patient's cognitive ability adequate to safely complete daily activities?: Yes Is the patient deaf or have difficulty hearing?: No Does the patient have difficulty seeing, even when wearing glasses/contacts?: No Does the patient have difficulty concentrating, remembering, or making decisions?: No Patient able to express need for assistance with ADLs?: Yes Does the patient have difficulty dressing or bathing?: No Independently performs ADLs?: Yes (appropriate for developmental age) Does the patient have difficulty walking or climbing stairs?: No Weakness of Legs: None Weakness of Arms/Hands: None  Permission Sought/Granted         Permission granted to share info w AGENCY: [facility]        Emotional Assessment              Admission diagnosis:  Cough [R05] Hypoxia [R09.02] Flu-like symptoms [R68.89] COVID-19 [U07.1] Patient Active Problem List   Diagnosis Date Noted  . Atrial fibrillation with RVR (HCC) 02/26/2019  . Flu-like symptoms 02/22/2019  . Acute respiratory failure with hypoxia (HCC) 02/22/2019  . COVID-19 02/22/2019  . Essential hypertension, benign 05/03/2018  . Hypertension 10/24/2017  . Knee osteoarthritis 10/24/2017   PCP:  Cain Saupe, MD  Pharmacy:   [facility] [place], [place] [address] [place] [address] Phone: [phone] Fax: [phone]     Social Determinants of Health (SDOH) Interventions    Readmission Risk Interventions No flowsheet data found.

## 2019-02-28 NOTE — Consult Note (Signed)
Cardiology Consultation:   Due to the COVID-19 pandemic, this visit was completed with telemedicine (audio/video) technology to reduce patient and provider exposure as well as to preserve personal protective equipment.   Patient ID: [name] MRN: [id number]; DOB: Jul 22, 1950  Admit date: 02/22/2019 Date of Consult: 02/28/2019  Primary Care Provider: Antony Blackbird, MD Primary Cardiologist:None (NEW) Primary Electrophysiologist:  None    Patient Profile:   [name] [name] is a 69 y.o. adult with a hx of HTN and CKD stage 2  who is being seen today for the evaluation of new onset atrial fibrillation with RVR at the request of Dr. Irene Pap.  History of Present Illness:   [name] is a 69yo AAF with a hx of HTN and CKD stage 2 who was admitted on 02/22/2019 with a 7 day hx of SOB and cough.  She called her PCP and because of sx worrisome for COVID, she was sent to the ER.  She was hypoxic on arrival with O2 sats of 88% and Cxray showed bilateral airspace infiltrate therefore admitted to the hospital.  She was started on routine treatment with remdesivir and Solu-Medrol, slowly weaned off oxygen.  Intermittently received Lasix which improved her symptoms as well.  She went into atrial fibrillation with RVR on 02/26/2019.  She was started on beta-blocker and anticoagulation with Eliquis 5mg  BID for CHADS2VASC score of 3.    Cardiology is now asked to consult for guidance with therapy for afib due to persistently elevated HRs.  History and interview obtained through a [place] interpreter.  She denies any Chest pain or pressure, PND, orthopnea, LE edema, dizziness, palpitations or syncope.  Her SOB has improved.    Past Medical History:  Diagnosis Date  . Hypertension     Past Surgical History:  Procedure Laterality Date  . NO PAST SURGERIES       Home Medications:  Prior to Admission medications   Medication Sig Start Date End Date Taking? Authorizing Provider   acetaminophen-codeine (TYLENOL #3) 300-30 MG tablet Take 1 tablet by mouth every 6 (six) hours as needed for moderate pain. Patient taking differently: Take 1 tablet by mouth every 6 (six) hours as needed (arthritis pain).  08/08/18  Yes Fulp, Cammie, MD  amLODipine (NORVASC) 5 MG tablet Take 1 tablet (5 mg total) by mouth daily. To lower blood pressure 09/26/18  Yes Fulp, Cammie, MD  diclofenac sodium (VOLTAREN) 1 % GEL Apply 4 g topically 4 (four) times daily. Apply to feet 4 times daily PRN for pain. Patient taking differently: Apply 4 g topically 4 (four) times daily as needed (knee pain).  09/26/18  Yes Fulp, Cammie, MD  triamcinolone cream (KENALOG) 0.1 % Apply to dry skin 1-2 times daily sparingly Patient taking differently: Apply 1 application topically See admin instructions. Apply sparingly to dark spots on face as needed for itching. 09/26/18  Yes Fulp, Cammie, MD  docusate sodium (COLACE) 100 MG capsule Take one or two pills at bedtime to help with constipaiton Patient not taking: Reported on 02/24/2019 09/26/18   Fulp, Cammie, MD  gabapentin (NEURONTIN) 100 MG capsule Take 1 capsule (100 mg total) by mouth 3 (three) times daily. Patient not taking: Reported on 02/24/2019 08/08/18   Antony Blackbird, MD  mupirocin ointment (BACTROBAN) 2 % Apply bid to wound Patient not taking: Reported on 02/24/2019 09/26/18   Fulp, Ander Gaster, MD  rosuvastatin (CRESTOR) 10 MG tablet Take 1 tablet (10 mg total) by mouth daily. To lower cholesterol Patient  not taking: Reported on 02/24/2019 08/09/18   Cain Saupe, MD    Inpatient Medications: Scheduled Meds: . amLODipine  5 mg Oral Daily  . apixaban  5 mg Oral BID  . ascorbic acid  500 mg Oral BID  . fluticasone  2 spray Each Nare Daily  . gabapentin  100 mg Oral TID  . guaiFENesin  1,200 mg Oral BID  . insulin aspart  0-5 Units Subcutaneous QHS  . insulin aspart  0-9 Units Subcutaneous TID WC  . Ipratropium-Albuterol  2 puff Inhalation BID  . loratadine  10 mg  Oral Daily  . mouth rinse  15 mL Mouth Rinse BID  . methylPREDNISolone (SOLU-MEDROL) injection  60 mg Intravenous Q12H  . metoprolol tartrate  50 mg Oral BID  . pantoprazole  40 mg Oral Q0600  . rosuvastatin  10 mg Oral Daily  . senna  1 tablet Oral BID  . sodium chloride flush  3 mL Intravenous Q12H  . triamcinolone cream   Topical BID  . zinc sulfate  220 mg Oral Daily   Continuous Infusions:  PRN Meds: acetaminophen **OR** acetaminophen, acetaminophen-codeine, albuterol, diclofenac Sodium, ondansetron **OR** ondansetron (ZOFRAN) IV, polyethylene glycol, sodium phosphate, sorbitol  Allergies:   No Known Allergies  Social History:   Social History   Socioeconomic History  . Marital status: Widowed    Spouse name: Not on file  . Number of children: Not on file  . Years of education: Not on file  . Highest education level: Not on file  Occupational History  . Not on file  Tobacco Use  . Smoking status: Never Smoker  . Smokeless tobacco: Never Used  Substance and Sexual Activity  . Alcohol use: Never  . Drug use: Never  . Sexual activity: Not Currently  Other Topics Concern  . Not on file  Social History Narrative  . Not on file   Social Determinants of Health   Financial Resource Strain:   . Difficulty of Paying Living Expenses: Not on file  Food Insecurity:   . Worried About Programme researcher, broadcasting/film/video in the Last Year: Not on file  . Ran Out of Food in the Last Year: Not on file  Transportation Needs:   . Lack of Transportation (Medical): Not on file  . Lack of Transportation (Non-Medical): Not on file  Physical Activity:   . Days of Exercise per Week: Not on file  . Minutes of Exercise per Session: Not on file  Stress:   . Feeling of Stress : Not on file  Social Connections:   . Frequency of Communication with Friends and Family: Not on file  . Frequency of Social Gatherings with Friends and Family: Not on file  . Attends Religious Services: Not on file  . Active  Member of Clubs or Organizations: Not on file  . Attends Banker Meetings: Not on file  . Marital Status: Not on file  Intimate Partner Violence:   . Fear of Current or Ex-Partner: Not on file  . Emotionally Abused: Not on file  . Physically Abused: Not on file  . Sexually Abused: Not on file    Family History:    Family History  Family history unknown: Yes     ROS:  Please see the history of present illness.   All other ROS reviewed and negative.     Physical Exam/Data:   Vitals:   02/28/19 0530 02/28/19 0600 02/28/19 0800 02/28/19 1100  BP: 105/79  109/88  Pulse: 64 78 79 67  Resp: (!) 28 18 16 20   Temp:   (!) 97.5 F (36.4 C)   TempSrc:   Oral   SpO2: 93% 93% 94% 94%  Weight:      Height:        Intake/Output Summary (Last 24 hours) at 02/28/2019 1235 Last data filed at 02/28/2019 0610 Gross per 24 hour  Intake 489 ml  Output 300 ml  Net 189 ml   Last 3 Weights 02/28/2019 02/24/2019 09/26/2018  Weight (lbs) 201 lb 15.1 oz 194 lb 3.6 oz 194 lb 12.8 oz  Weight (kg) 91.6 kg 88.1 kg 88.361 kg     Body mass index is 34.66 kg/m.   VITAL SIGNS:  reviewed GEN:  WD WN in NAD EYES:  sclerae anicteric, EOMI - Extraocular Movements Intact RESPIRATORY:  no use of accessory muscles for breathing CARDIOVASCULAR:  no peripheral edema SKIN:  no rash, lesions or ulcers. MUSCULOSKELETAL:  no obvious deformities. NEURO:  alert and oriented x 3, no obvious focal deficit PSYCH:  normal affect  EKG:  The EKG was personally reviewed and demonstrates:  Atrial fibrillation with RVR with anterior and inferior ischemia Telemetry:  Telemetry was personally reviewed and demonstrates:  Atrial fibrillation with RVR in th 100-110'2  Relevant CV Studies: EKG  Laboratory Data:  Chemistry Recent Labs  Lab 02/26/19 0446 02/27/19 0240 02/28/19 0324  NA 140 137 135  K 4.8 4.6 4.9  CL 109 106 106  CO2 23 22 21*  GLUCOSE 187* 295* 419*  BUN 29* 36* 37*  CREATININE  1.11* 1.34* 1.51*  CALCIUM 8.4* 8.4* 8.5*  GFRNONAA 50* 40* 35*  GFRAA 58* 46* 40*  ANIONGAP 8 9 8     Recent Labs  Lab 02/22/19 1855 02/23/19 0236  PROT 7.1 6.9  ALBUMIN 2.6* 2.6*  AST 18 18  ALT 13 13  ALKPHOS 37* 47  BILITOT 0.8 0.7   Hematology Recent Labs  Lab 02/26/19 0446 02/27/19 0240 02/28/19 0324  WBC 9.2 11.5* 9.3  RBC 4.32 4.44 4.63  HGB 11.3* 11.6* 12.2  HCT 34.4* 34.9* 36.7  MCV 79.6* 78.6* 79.3*  MCH 26.2 26.1 26.3  MCHC 32.8 33.2 33.2  RDW 16.6* 16.7* 17.1*  PLT 242 271 271   Cardiac EnzymesNo results for input(s): TROPONINI in the last 168 hours. No results for input(s): TROPIPOC in the last 168 hours.  BNP Recent Labs  Lab 02/22/19 1620  BNP 190.4*    DDimer  Recent Labs  Lab 02/26/19 0446 02/27/19 0240 02/28/19 0324  DDIMER 2.25* 1.88* 1.52*    Radiology/Studies:  No results found.  @COVIDRISKCOMPLICATION @   Assessment and Plan:   1. New onset atrial fibrillation with RVR -HR remains elevated but improved on Lopresor 50mg  BID -continue Apixaban 5mg  BID for CHADS2VASC score of 3 (>65yo/HTN/female). -change Amlodipine to Cardizem 30mg  q6 hours for better BP control -2D echo pending -continue to follow on tele  2.  HTN -BP on the soft side and needs better HR control -stop amlodipine and start Cardizem 30mg  q6 hours     For questions or updates, please contact [facility] Please consult [email or web address] for contact info under     Signed, 03/01/19, MD  02/28/2019 12:35 PM

## 2019-02-28 NOTE — Telephone Encounter (Signed)
This CM spoke to Raynald Blend, RN CM and informed that the patient can apply for patient assistance program for eliquis at [facility] pharmacy. She has no income and can also apply for assistance with some medication costs through the [facility]). Some medications can be provided at no cost to the patient.  [facility] pharmacy can assist with these program applications. The availability of medications through [facility] will need to be confirmed at the time of discharge.   Discharge date has not been confirmed. Follow up appointment with PCP scheduled for 03/08/2019 but can be rescheduled if needed. This will be a televisit.

## 2019-03-01 LAB — D-DIMER, QUANTITATIVE: D-Dimer, Quant: 1.49 ug/mL-FEU — ABNORMAL HIGH (ref 0.00–0.50)

## 2019-03-01 LAB — CBC WITH DIFFERENTIAL/PLATELET
Abs Immature Granulocytes: 0.11 10*3/uL — ABNORMAL HIGH (ref 0.00–0.07)
Basophils Absolute: 0 10*3/uL (ref 0.0–0.1)
Basophils Relative: 0 %
Eosinophils Absolute: 0 10*3/uL (ref 0.0–0.5)
Eosinophils Relative: 0 %
HCT: 34.9 % — ABNORMAL LOW (ref 36.0–46.0)
Hemoglobin: 11.9 g/dL — ABNORMAL LOW (ref 12.0–15.0)
Immature Granulocytes: 1 %
Lymphocytes Relative: 11 %
Lymphs Abs: 1.1 10*3/uL (ref 0.7–4.0)
MCH: 26.5 pg (ref 26.0–34.0)
MCHC: 34.1 g/dL (ref 30.0–36.0)
MCV: 77.7 fL — ABNORMAL LOW (ref 80.0–100.0)
Monocytes Absolute: 0.3 10*3/uL (ref 0.1–1.0)
Monocytes Relative: 3 %
Neutro Abs: 9 10*3/uL — ABNORMAL HIGH (ref 1.7–7.7)
Neutrophils Relative %: 85 %
Platelets: 248 10*3/uL (ref 150–400)
RBC: 4.49 MIL/uL (ref 3.87–5.11)
RDW: 16.9 % — ABNORMAL HIGH (ref 11.5–15.5)
WBC: 10.5 10*3/uL (ref 4.0–10.5)
nRBC: 0 % (ref 0.0–0.2)

## 2019-03-01 LAB — COMPREHENSIVE METABOLIC PANEL
ALT: 16 U/L (ref 0–44)
AST: 11 U/L — ABNORMAL LOW (ref 15–41)
Albumin: 2.2 g/dL — ABNORMAL LOW (ref 3.5–5.0)
Alkaline Phosphatase: 65 U/L (ref 38–126)
Anion gap: 7 (ref 5–15)
BUN: 37 mg/dL — ABNORMAL HIGH (ref 8–23)
CO2: 24 mmol/L (ref 22–32)
Calcium: 8.4 mg/dL — ABNORMAL LOW (ref 8.9–10.3)
Chloride: 108 mmol/L (ref 98–111)
Creatinine, Ser: 1.39 mg/dL — ABNORMAL HIGH (ref 0.44–1.00)
GFR calc Af Amer: 44 mL/min — ABNORMAL LOW (ref >60)
GFR calc non Af Amer: 38 mL/min — ABNORMAL LOW (ref >60)
Glucose, Bld: 271 mg/dL — ABNORMAL HIGH (ref 70–99)
Potassium: 5.1 mmol/L (ref 3.5–5.1)
Sodium: 139 mmol/L (ref 135–145)
Total Bilirubin: 0.4 mg/dL (ref 0.3–1.2)
Total Protein: 5.8 g/dL — ABNORMAL LOW (ref 6.5–8.1)

## 2019-03-01 LAB — GLUCOSE, CAPILLARY
Glucose-Capillary: 247 mg/dL — ABNORMAL HIGH (ref 70–99)
Glucose-Capillary: 271 mg/dL — ABNORMAL HIGH (ref 70–99)
Glucose-Capillary: 425 mg/dL — ABNORMAL HIGH (ref 70–99)
Glucose-Capillary: 378 mg/dL — ABNORMAL HIGH (ref 70–99)
Glucose-Capillary: 210 mg/dL — ABNORMAL HIGH (ref 70–99)

## 2019-03-01 LAB — C-REACTIVE PROTEIN: CRP: 0.7 mg/dL (ref <1.0)

## 2019-03-01 LAB — FERRITIN: Ferritin: 31 ng/mL (ref 11–307)

## 2019-03-01 MED ORDER — INSULIN ASPART 100 UNIT/ML ~~LOC~~ SOLN
7.0000 [IU] | Freq: Three times a day (TID) | SUBCUTANEOUS | Status: DC
  Administered 2019-03-01 (×2): 7 [IU] via SUBCUTANEOUS

## 2019-03-01 MED ORDER — METHYLPREDNISOLONE SODIUM SUCC 40 MG IJ SOLR
40.0000 mg | Freq: Two times a day (BID) | INTRAMUSCULAR | Status: DC
  Administered 2019-03-01 – 2019-03-02 (×3): 40 mg via INTRAVENOUS
  Filled 2019-03-01 (×3): qty 1

## 2019-03-01 MED ORDER — INSULIN ASPART 100 UNIT/ML ~~LOC~~ SOLN
0.0000 [IU] | Freq: Three times a day (TID) | SUBCUTANEOUS | Status: DC
  Administered 2019-03-02: 08:00:00 11 [IU] via SUBCUTANEOUS
  Administered 2019-03-02 (×2): 15 [IU] via SUBCUTANEOUS
  Administered 2019-03-03: 5 [IU] via SUBCUTANEOUS
  Administered 2019-03-03: 2 [IU] via SUBCUTANEOUS
  Administered 2019-03-03 – 2019-03-04 (×2): 3 [IU] via SUBCUTANEOUS
  Administered 2019-03-04: 2 [IU] via SUBCUTANEOUS
  Administered 2019-03-04: 3 [IU] via SUBCUTANEOUS
  Administered 2019-03-05: 2 [IU] via SUBCUTANEOUS

## 2019-03-01 MED ORDER — INSULIN GLARGINE 100 UNIT/ML ~~LOC~~ SOLN
10.0000 [IU] | Freq: Two times a day (BID) | SUBCUTANEOUS | Status: DC
  Administered 2019-03-01 – 2019-03-02 (×2): 10 [IU] via SUBCUTANEOUS
  Filled 2019-03-01 (×3): qty 0.1

## 2019-03-01 MED ORDER — INSULIN ASPART 100 UNIT/ML ~~LOC~~ SOLN
0.0000 [IU] | Freq: Every day | SUBCUTANEOUS | Status: DC
  Administered 2019-03-01: 22:00:00 2 [IU] via SUBCUTANEOUS
  Administered 2019-03-02 – 2019-03-03 (×2): 3 [IU] via SUBCUTANEOUS

## 2019-03-01 MED ORDER — INSULIN ASPART 100 UNIT/ML ~~LOC~~ SOLN
15.0000 [IU] | Freq: Once | SUBCUTANEOUS | Status: AC
  Administered 2019-03-01: 17:00:00 15 [IU] via SUBCUTANEOUS

## 2019-03-01 MED ORDER — INSULIN ASPART 100 UNIT/ML ~~LOC~~ SOLN
10.0000 [IU] | Freq: Three times a day (TID) | SUBCUTANEOUS | Status: DC
  Administered 2019-03-01 – 2019-03-05 (×11): 10 [IU] via SUBCUTANEOUS

## 2019-03-01 MED ORDER — IPRATROPIUM-ALBUTEROL 20-100 MCG/ACT IN AERS
2.0000 | INHALATION_SPRAY | Freq: Three times a day (TID) | RESPIRATORY_TRACT | Status: DC
  Administered 2019-03-01 – 2019-03-05 (×13): 2 via RESPIRATORY_TRACT

## 2019-03-01 NOTE — Progress Notes (Signed)
Spoke to patient's close friend Early regarding patient's condition and discharge plan.

## 2019-03-01 NOTE — Progress Notes (Signed)
Occupational Therapy Treatment Patient Details Name: [name] [name] MRN: [id number] DOB: October 10, 1950 Today's Date: 03/01/2019    History of present illness Pt is a 69 y.o. female admitted 02/22/19 with worsening SOB and cough; (+) COVID-19. CXR indicated Bilateral airspace disease; favor congestive heart failure and edema over PNA. 02/25/19 developed afib with RVR PMH includes  HTN, chronic knee pain, CKD II.   OT comments  Used audio call on stratus interpreter to conduct session ( [place]). Pt making steady progress towards OT goals this session. Session focus on functional mobility as precursor to higher level ADLs and LB dressing. Overall, pt requires supervision for LB ADLs and min guard assist for functional mobility with no AD. Pt HR 120 bpm upon OT arrival with pt agreeable to session wanting to walk however pt HR increase to 154 bpm upon sit>stand. Assisted pt with stand pivot transfer to recliner and educated pt on increasing HR with pt stating that she does feel as if her heart is beating rapidly. Pt expresses frustration as pt is motivated to work towards returning to [employer]. Will follow acutely per POC. DC plan currently remains appropriate.    Follow Up Recommendations  Home health OT;Supervision - Intermittent    Equipment Recommendations  Tub/shower seat    Recommendations for Other Services      Precautions / Restrictions Precautions Precautions: Fall Restrictions Weight Bearing Restrictions: No       Mobility Bed Mobility Overal bed mobility: Modified Independent       Supine to sit: Modified independent (Device/Increase time)     General bed mobility comments: use of bed rail to pull to EoB  Transfers Overall transfer level: Needs assistance Equipment used: None Transfers: Sit to/from [facility] Sit to Stand: Min guard Stand pivot transfers: Min guard       General transfer comment: min guard for power up and steadying, assist to  manage lines during transfer    Balance Overall balance assessment: Needs assistance Sitting-balance support: Feet supported Sitting balance-Leahy Scale: Good     Standing balance support: No upper extremity supported;During functional activity Standing balance-Leahy Scale: Fair Standing balance comment: close min guard for safety                           ADL either performed or assessed with clinical judgement   ADL Overall ADL's : Needs assistance/impaired             Lower Body Bathing: Supervison/ safety;Bed level Lower Body Bathing Details (indicate cue type and reason): simulated bed level in long sitting; able to reach feet to pull up socks     Lower Body Dressing: Bed level;Supervision/safety Lower Body Dressing Details (indicate cue type and reason): able to pull up socks from bed level in long sittin g Toilet Transfer: Min Designer, jewellery Details (indicate cue type and reason): simulated via functional mobility from EOB>recliner with no AD as pt declined using RW         Functional mobility during ADLs: Min guard;Cueing for safety;Rolling walker General ADL Comments: session focus on functional mobility as precursor to highe level ADLs. pt HR continues to increase with minimal exertion increasing to 154bpm during stand pivot transfer to recliner     Vision       Perception     Praxis      Cognition Arousal/Alertness: Awake/alert Behavior During Therapy: WFL for tasks assessed/performed Overall Cognitive Status: Difficult to assess  General Comments: utilized Engineer, civil (consulting) [phone]        Exercises     Shoulder Instructions       General Comments Pt HR 120 bpm upon arrival increasing to 154 bpm during transfer from EOB>recliner. pt wanting to walk but provided education on increasing HR limiting session this date- RN aware    Pertinent Vitals/ Pain       Pain  Assessment: No/denies pain  Home Living                                          Prior Functioning/Environment              Frequency  Min 2X/week        Progress Toward Goals  OT Goals(current goals can now be found in the care plan section)  Progress towards OT goals: Progressing toward goals  Acute Rehab OT Goals Patient Stated Goal: home soon OT Goal Formulation: With patient Time For Goal Achievement: 03/11/19 Potential to Achieve Goals: Good  Plan Discharge plan remains appropriate    Co-evaluation                 AM-PAC OT "6 Clicks" Daily Activity     Outcome Measure   Help from another person eating meals?: None Help from another person taking care of personal grooming?: A Little Help from another person toileting, which includes using toliet, bedpan, or urinal?: A Little Help from another person bathing (including washing, rinsing, drying)?: A Little Help from another person to put on and taking off regular upper body clothing?: None Help from another person to put on and taking off regular lower body clothing?: A Little 6 Click Score: 20    End of Session    OT Visit Diagnosis: Other abnormalities of gait and mobility (R26.89);Muscle weakness (generalized) (M62.81)   Activity Tolerance Patient tolerated treatment well   Patient Left in chair;with call bell/phone within reach   Nurse Communication Mobility status        Time: 1025-8527 OT Time Calculation (min): 21 min  Charges: OT General Charges $OT Visit: 1 Visit OT Treatments $Therapeutic Activity: 8-22 mins  Audery Amel., COTA/L Acute Rehabilitation Services [phone]    Angelina Pih 03/01/2019, 2:45 PM

## 2019-03-01 NOTE — Progress Notes (Addendum)
Patient's BP 91/60, 1000 Metoprolol held. Dr. Margo Aye made aware during morning rounds.

## 2019-03-01 NOTE — Progress Notes (Addendum)
PROGRESS NOTE  [name] DuhamelVeronque Kabika [name] ZOX:[id number] DOB: 1950-03-09 DOA: 02/22/2019 PCP: Cain SaupeFulp, Cammie, MD  HPI/Recap of past 1124 hours: 69 year old with history of HTN, chronic knee arthritis, CKD stage II came to the ER with complaints of worsening shortness of breath and cough for 7 days.    Hypoxic on presentation with O2 saturation 88% on room air.  COVID-19 screening test positive on 02/22/2019.  Chest x-ray showing bilateral airspace infiltrate.  Started on COVID-19 directed therapies.  Slowly weaned off oxygen.  Intermittently received Lasix which improved her symptoms.  Went into atrial fibrillation with RVR on 02/26/2019.    CHA2DS2-VASc of 3.  She was started on beta-blocker and anticoagulation.  2D echo obtained revealed LVEF 60 to 65% with severely dilated left atrium.  Moderately increased left ventricular posterior wall thickness; left ventricular septal wall thickness was moderately increased.  Cardiology consulted and following.  Ongoing adjustment of rate control medications.  03/01/19: Seen and examined.  Hypotensive overnight.  Cardiac medications were held due to hypotension.  Ongoing adjustment of cardiac medications by cardiology.  She reports her breathing is improved however still feels weak.   Assessment/Plan: Principal Problem:   Acute respiratory failure with hypoxia (HCC) Active Problems:   Hypertension   Knee osteoarthritis   Essential hypertension, benign   Flu-like symptoms   COVID-19   Atrial fibrillation with RVR (HCC)   Acute hypoxic respiratory failure secondary to acute COVID-19 viral pneumonia. Positive COVID-19 test on 02/22/2019.   Not on oxygen supplementation at baseline. Hypoxia has improved and is now on room air with O2 saturation above 92%. Obtain home O2 evaluation prior to DC -Remdesivir-completed 5 days of remdesivir on 02/27/2019. -Solumedrol-D8/10 -Routine: Labs have been reviewed including ferritin, LDH, CRP, d-dimer, fibrinogen.      Inflammatory markers are trending down. Continue to trend inflammatory markers -Vitamin C & Zinc, vitamin D3. Prone if needed>16hrs/day.  -procalcitonin-negative, BNP 190 -continue supportive care-antitussive, inhalers, I-S/flutter -CODE STATUS confirmed full code. Out of bed to chair.  Mobilize with assistance and fall precautions. OT/PT- Recommend HH OT Case manager assisting with home health services.  Newly diagnosed atrial fibrillation with RVR CHADs2VAsc 3 Management per cardiology Ongoing adjustment of cardiac medications for rate control. Continue Eliquis for primary CVA prevention 2D echo with findings as noted above.  Hypotension in the setting of new cardiac medications Continue to closely monitor on telemetry Maintain MAP greater than 65 to avoid complications Ongoing adjustment of medications by cardiology.  Abnormal TSH, possibly contributed by acute illness TSH 0.080 Free T4 1.47 Patient to repeat TSH in 6 weeks when acute illness has resolved.  May need work-up outpatient if no improvement. Will need to Follow up with PCP  Hyperglycemia with no prior history of diabetes Likely exacerbated by steroid use Hemoglobin A1c 6.2 on 02/28/19 Continue sensitive insulin sliding scale Added Lantus 5 units twice daily and NovoLog 7 units 3 times daily. Continue to monitor CBG Continue carb modified diet  Essential hypertension, currently with hypotension Management as stated above  Improving AKI on CKD 2  Baseline creatinine appears to be 1.0 with GFR greater than 60 Today her creatinine is 1.39 from 1.51  Continue to avoid nephrotoxins Monitor urine output; will need to be documented. Continue BMP in the morning  Peripheral neuropathy -Gabapentin 3 times daily  Hyperlipidemia -Continue Crestor  Bilateral knee osteoarthritis -Continue local analgesic  DVT prophylaxis: Eliquis  Code Status: Full code    Family Communication: . Discussed with her  friend Ms.  Smith via phone [phone].    Disposition Plan:  Patient is from home.  Anticipate discharge to home likely tomorrow 03/02/2019 when cardiology signs off or when her blood pressure is stable on recommended cardiac medications.     Consult: Cardiology on 02/28/2019.  Objective: Vitals:   03/01/19 0000 03/01/19 0011 03/01/19 0457 03/01/19 0900  BP: 91/62 94/66 (!) 98/59 91/60  Pulse: 86 73 (!) 111 84  Resp: 17 16 19 16   Temp:   97.6 F (36.4 C)   TempSrc:   Oral   SpO2: 92% 91% 93% 93%  Weight:   91 kg   Height:        Intake/Output Summary (Last 24 hours) at 03/01/2019 1159 Last data filed at 02/28/2019 1900 Gross per 24 hour  Intake 240 ml  Output --  Net 240 ml   Filed Weights   02/24/19 1100 02/28/19 0500 03/01/19 0457  Weight: 88.1 kg 91.6 kg 91 kg    Exam:  . General: 69 y.o. year-old adult well-developed well-nourished no acute distress.  Alert oriented x3.   . Cardiovascular: Irregular rate and rhythm with no rubs or gallops. Marland Kitchen. Respiratory: Mild rales at bases no wheezing noted.  Good inspiratory effort.  Abdomen: Soft nontender nondistended with bowel sounds present.  Musculoskeletal: No lower extremity edema bilaterally.   Psychiatry: Mood is appropriate for condition and setting.  Data Reviewed: CBC: Recent Labs  Lab 02/25/19 0719 02/26/19 0446 02/27/19 0240 02/28/19 0324 03/01/19 0742  WBC 8.8 9.2 11.5* 9.3 10.5  NEUTROABS  --   --   --   --  9.0*  HGB 11.6* 11.3* 11.6* 12.2 11.9*  HCT 35.4* 34.4* 34.9* 36.7 34.9*  MCV 80.3 79.6* 78.6* 79.3* 77.7*  PLT 259 242 271 271 248   Basic Metabolic Panel: Recent Labs  Lab 02/24/19 0305 02/24/19 0305 02/25/19 0719 02/26/19 0446 02/27/19 0240 02/28/19 0324 03/01/19 0742  NA 139   < > 140 140 137 135 139  K 4.7   < > 4.9 4.8 4.6 4.9 5.1  CL 104   < > 105 109 106 106 108  CO2 24   < > 23 23 22  21* 24  GLUCOSE 134*   < > 155* 187* 295* 419* 271*  BUN 19   < > 28* 29* 36* 37* 37*   CREATININE 1.01*   < > 1.26* 1.11* 1.34* 1.51* 1.39*  CALCIUM 8.7*   < > 8.8* 8.4* 8.4* 8.5* 8.4*  MG 1.9  --  2.0 2.0 2.1 2.2  --    < > = values in this interval not displayed.   GFR: Estimated Creatinine Clearance (by C-G formula based on SCr of 1.39 mg/dL (H)) Female: 56.241.1 mL/min (A) Female: 50.3 mL/min (A) Liver Function Tests: Recent Labs  Lab 02/22/19 1855 02/23/19 0236 03/01/19 0742  AST 18 18 11*  ALT 13 13 16   ALKPHOS 37* 47 65  BILITOT 0.8 0.7 0.4  PROT 7.1 6.9 5.8*  ALBUMIN 2.6* 2.6* 2.2*   No results for input(s): LIPASE, AMYLASE in the last 168 hours. No results for input(s): AMMONIA in the last 168 hours. Coagulation Profile: No results for input(s): INR, PROTIME in the last 168 hours. Cardiac Enzymes: No results for input(s): CKTOTAL, CKMB, CKMBINDEX, TROPONINI in the last 168 hours. BNP (last 3 results) No results for input(s): PROBNP in the last 8760 hours. HbA1C: Recent Labs    02/28/19 0324  HGBA1C 6.2*   CBG: Recent Labs  Lab 02/28/19 1218  02/28/19 1717 02/28/19 2114 02/28/19 2356 03/01/19 0739  GLUCAP 337* 363* 345* 310* 247*   Lipid Profile: No results for input(s): CHOL, HDL, LDLCALC, TRIG, CHOLHDL, LDLDIRECT in the last 72 hours. Thyroid Function Tests: Recent Labs    02/27/19 0240 02/28/19 0324  TSH 0.080*  --   FREET4  --  1.47*   Anemia Panel: Recent Labs    02/28/19 0324 03/01/19 0742  FERRITIN 33 31   Urine analysis:    Component Value Date/Time   COLORURINE YELLOW 02/22/2019 2241   APPEARANCEUR CLEAR 02/22/2019 2241   LABSPEC 1.008 02/22/2019 2241   PHURINE 8.0 02/22/2019 2241   GLUCOSEU NEGATIVE 02/22/2019 2241   HGBUR NEGATIVE 02/22/2019 2241   BILIRUBINUR NEGATIVE 02/22/2019 2241   KETONESUR NEGATIVE 02/22/2019 2241   PROTEINUR NEGATIVE 02/22/2019 2241   NITRITE NEGATIVE 02/22/2019 2241   LEUKOCYTESUR NEGATIVE 02/22/2019 2241   Sepsis Labs: @LABRCNTIP (procalcitonin:4,lacticidven:4)  ) Recent Results  (from the past 240 hour(s))  SARS CORONAVIRUS 2 (TAT 6-24 HRS) Nasopharyngeal Nasopharyngeal Swab     Status: Abnormal   Collection Time: 02/22/19  4:27 PM   Specimen: Nasopharyngeal Swab  Result Value Ref Range Status   SARS Coronavirus 2 POSITIVE (A) NEGATIVE Final    Comment: RESULT CALLED TO, READ BACK BY AND VERIFIED WITH: D.YAO MD 2115 02/22/19 MCCORMICK K (NOTE) SARS-CoV-2 target nucleic acids are DETECTED. The SARS-CoV-2 RNA is generally detectable in upper and lower respiratory specimens during the acute phase of infection. Positive results are indicative of the presence of SARS-CoV-2 RNA. Clinical correlation with patient history and other diagnostic information is  necessary to determine patient infection status. Positive results do not rule out bacterial infection or co-infection with other viruses.  The expected result is Negative. Fact Sheet for Patients: 02/24/19 Fact Sheet for Healthcare Providers: HairSlick.no This test is not yet approved or cleared by the quierodirigir.com FDA and  has been authorized for detection and/or diagnosis of SARS-CoV-2 by FDA under an Emergency Use Authorization (EUA). This EUA will remain  in effect (meaning this test can be used) for the  duration of the COVID-19 declaration under Section 564(b)(1) of the Act, 21 U.S.C. section 360bbb-3(b)(1), unless the authorization is terminated or revoked sooner. Performed at [facility], [address], [place], [place]   Blood Culture (routine x 2)     Status: None   Collection Time: 02/22/19  7:09 PM   Specimen: BLOOD  Result Value Ref Range Status   Specimen Description BLOOD RIGHT ANTECUBITAL  Final   Special Requests   Final    BOTTLES DRAWN AEROBIC AND ANAEROBIC Blood Culture adequate volume   Culture   Final    NO GROWTH 5 DAYS Performed at [facility], [address] [place], [place]     Report Status 02/27/2019 FINAL  Final  Culture, Urine     Status: Abnormal   Collection Time: 02/22/19 10:41 PM   Specimen: Urine, Random  Result Value Ref Range Status   Specimen Description URINE, RANDOM  Final   Special Requests   Final    NONE Performed at [facility], [address], [place], [place]    Culture (A)  Final    40,000 COLONIES/mL ESCHERICHIA COLI 40,000 COLONIES/mL KLEBSIELLA PNEUMONIAE Confirmed Extended Spectrum Beta-Lactamase Producer (ESBL).  In bloodstream infections from ESBL organisms, carbapenems are preferred over piperacillin/tazobactam. They are shown to have a lower risk of mortality.    Report Status  02/25/2019 FINAL  Final   Organism ID, Bacteria ESCHERICHIA COLI (A)  Final   Organism ID, Bacteria KLEBSIELLA PNEUMONIAE (A)  Final      Susceptibility   Escherichia coli - MIC*    AMPICILLIN >=32 RESISTANT Resistant     CEFAZOLIN >=64 RESISTANT Resistant     CEFTRIAXONE >=64 RESISTANT Resistant     CIPROFLOXACIN <=0.25 SENSITIVE Sensitive     GENTAMICIN >=16 RESISTANT Resistant     IMIPENEM <=0.25 SENSITIVE Sensitive     NITROFURANTOIN <=16 SENSITIVE Sensitive     TRIMETH/SULFA >=320 RESISTANT Resistant     AMPICILLIN/SULBACTAM >=32 RESISTANT Resistant     PIP/TAZO 8 SENSITIVE Sensitive     * 40,000 COLONIES/mL ESCHERICHIA COLI   Klebsiella pneumoniae - MIC*    AMPICILLIN >=32 RESISTANT Resistant     CEFAZOLIN >=64 RESISTANT Resistant     CEFTRIAXONE >=64 RESISTANT Resistant     CIPROFLOXACIN 0.5 SENSITIVE Sensitive     GENTAMICIN <=1 SENSITIVE Sensitive     IMIPENEM <=0.25 SENSITIVE Sensitive     NITROFURANTOIN 128 RESISTANT Resistant     TRIMETH/SULFA >=320 RESISTANT Resistant     AMPICILLIN/SULBACTAM >=32 RESISTANT Resistant     PIP/TAZO 8 SENSITIVE Sensitive     * 40,000 COLONIES/mL KLEBSIELLA PNEUMONIAE  Blood Culture (routine x 2)     Status: None   Collection Time: 02/23/19  2:16 AM   Specimen: BLOOD LEFT HAND  Result  Value Ref Range Status   Specimen Description BLOOD LEFT HAND  Final   Special Requests   Final    AEROBIC BOTTLE ONLY Blood Culture results may not be optimal due to an inadequate volume of blood received in culture bottles   Culture   Final    NO GROWTH 5 DAYS Performed at [facility], [address] [place], [place] [address]    Report Status 02/28/2019 FINAL  Final      Studies: ECHOCARDIOGRAM LIMITED  Result Date: 02/28/2019   ECHOCARDIOGRAM LIMITED REPORT   Patient Name:   [name] [name] Date of Exam: 02/28/2019 Medical Rec #:  [id number]              Height:       64.0 in Accession #:    [id number]             Weight:       201.9 lb Date of Birth:  13-Mar-1950             BSA:          1.96 m Patient Age:    68 years               BP:           109/88 mmHg Patient Gender: F                      HR:           67 bpm. Exam Location:  Inpatient  Procedure: 2D Echo Indications:    Atrial fibrillation  History:        Patient has no prior history of Echocardiogram examinations.  Sonographer:    Ross Ludwig RDCS (AE) Referring Phys: [id number] Darlin Drop  Sonographer Comments: COVID-19. IMPRESSIONS  1. Left ventricular ejection fraction, by visual estimation, is 60 to 65%. Left ventricular septal wall thickness was moderately increased. Moderately increased left ventricular posterior wall thickness. There is moderately increased left ventricular wall thickness.  2. Left  atrial size was severely dilated.  3. Right atrial size was mildly dilated.  4. Mild mitral valve regurgitation.  5. Tricuspid valve regurgitation is mild.  6. Tricuspid valve regurgitation is mild.  7. Mildly elevated pulmonary artery systolic pressure.  8. The inferior vena cava is dilated in size with >50% respiratory variability, suggesting right atrial pressure of 8 mmHg. FINDINGS  Left Ventricle: Left ventricular ejection fraction, by visual estimation, is 60 to 65%. Left ventricular septal wall thickness was  moderately increased. Moderately increased left ventricular posterior wall thickness. There is moderately increased left ventricular wall thickness. Right Ventricle: The tricuspid regurgitant velocity is 2.41 m/s, and with an assumed right atrial pressure of 8 mmHg, the estimated right ventricular systolic pressure is mildly elevated at 31.2 mmHg. Left Atrium: Left atrial size was severely dilated. Right Atrium: Right atrial size was mildly dilated. Right atrial pressure is estimated at 8 mmHg. Mitral Valve: Mild mitral valve regurgitation. Tricuspid Valve: Tricuspid valve regurgitation is mild. Aortic Valve: The aortic valve is tricuspid. Aortic valve mean gradient measures 3.4 mmHg. Aortic valve peak gradient measures 6.5 mmHg. Aortic valve area, by VTI measures 1.67 cm. Venous: The inferior vena cava is dilated in size with greater than 50% respiratory variability, suggesting right atrial pressure of 8 mmHg.  LEFT VENTRICLE          Normals PLAX 2D LVIDd:         3.90 cm  3.6 cm LVIDs:         2.46 cm  1.7 cm LV PW:         1.57 cm  1.4 cm LV IVS:        1.66 cm  1.3 cm LVOT diam:     1.80 cm  2.0 cm LV SV:         44 ml    79 ml LV SV Index:   21.45    45 ml/m2 LVOT Area:     2.54 cm 3.14 cm2  IVC IVC diam: 2.96 cm LEFT ATRIUM              Index       RIGHT ATRIUM           Index LA diam:        3.20 cm  1.63 cm/m  RA Area:     20.20 cm LA Vol (A2C):   95.1 ml  48.41 ml/m RA Volume:   58.00 ml  29.53 ml/m LA Vol (A4C):   123.0 ml 62.61 ml/m LA Biplane Vol: 110.0 ml 56.00 ml/m  AORTIC VALVE                   Normals AV Area (Vmax):    1.65 cm AV Area (Vmean):   1.65 cm    3.06 cm2 AV Area (VTI):     1.67 cm AV Vmax:           127.00 cm/s AV Vmean:          82.960 cm/s 77 cm/s AV VTI:            0.203 m     3.15 cm2 AV Peak Grad:      6.5 mmHg AV Mean Grad:      3.4 mmHg    3 mmHg LVOT Vmax:         82.38 cm/s LVOT Vmean:        53.800 cm/s 75 cm/s LVOT VTI:  0.133 m     25.3 cm LVOT/AV VTI  ratio: 0.66        1  AORTA                 Normals Ao Root diam: 3.00 cm 31 mm TRICUSPID VALVE             Normals TR Peak grad:   23.2 mmHg TR Vmax:        309.00 cm/s 288 cm/s  SHUNTS Systemic VTI:  0.13 m Systemic Diam: 1.80 cm  Chilton Si MD Electronically signed by Chilton Si MD Signature Date/Time: 02/28/2019/6:59:37 PM    Final     Scheduled Meds: . apixaban  5 mg Oral BID  . ascorbic acid  500 mg Oral BID  . diltiazem  30 mg Oral Q6H  . fluticasone  2 spray Each Nare Daily  . gabapentin  100 mg Oral TID  . guaiFENesin  1,200 mg Oral BID  . insulin aspart  0-5 Units Subcutaneous QHS  . insulin aspart  0-9 Units Subcutaneous TID WC  . insulin aspart  7 Units Subcutaneous TID WC  . insulin glargine  5 Units Subcutaneous BID  . Ipratropium-Albuterol  2 puff Inhalation TID  . loratadine  10 mg Oral Daily  . mouth rinse  15 mL Mouth Rinse BID  . methylPREDNISolone (SOLU-MEDROL) injection  40 mg Intravenous Q12H  . metoprolol tartrate  50 mg Oral BID  . pantoprazole  40 mg Oral Q0600  . rosuvastatin  10 mg Oral Daily  . senna  1 tablet Oral BID  . sodium chloride flush  3 mL Intravenous Q12H  . triamcinolone cream   Topical BID  . zinc sulfate  220 mg Oral Daily    Continuous Infusions:   LOS: 7 days     Darlin Drop, MD [facility] Pager [phone]  If 7PM-7AM, please contact night-coverage [email or web address] Password [facility] 03/01/2019, 11:59 AM

## 2019-03-01 NOTE — Progress Notes (Addendum)
Patients blood sugar read 501 as nurse tech obtained. Finger not completely dried from alcohol pad. Blood sugar rechecked for 425. Dr. Margo Aye paged regarding. Will continue to monitor.  Addendum: Per Dr. Margo Aye, administer 15 units now and recheck blood sugar prior to administering meal coverage. Will continue to monitor.

## 2019-03-01 NOTE — Progress Notes (Signed)
Patient's BP 109/83, able to administered 1200 Cardizem. However following administration, pt's heart rate still elevated to 120's-130's at times.

## 2019-03-02 DIAGNOSIS — Z789 Other specified health status: Secondary | ICD-10-CM

## 2019-03-02 DIAGNOSIS — J9601 Acute respiratory failure with hypoxia: Secondary | ICD-10-CM

## 2019-03-02 DIAGNOSIS — N179 Acute kidney failure, unspecified: Secondary | ICD-10-CM

## 2019-03-02 DIAGNOSIS — U071 COVID-19: Principal | ICD-10-CM

## 2019-03-02 HISTORY — DX: Acute kidney failure, unspecified: N17.9

## 2019-03-02 LAB — CBC
HCT: 36 % (ref 36.0–46.0)
Hemoglobin: 12.3 g/dL (ref 12.0–15.0)
MCH: 26.7 pg (ref 26.0–34.0)
MCHC: 34.2 g/dL (ref 30.0–36.0)
MCV: 78.3 fL — ABNORMAL LOW (ref 80.0–100.0)
Platelets: 271 10*3/uL (ref 150–400)
RBC: 4.6 MIL/uL (ref 3.87–5.11)
RDW: 17.2 % — ABNORMAL HIGH (ref 11.5–15.5)
WBC: 12 10*3/uL — ABNORMAL HIGH (ref 4.0–10.5)
nRBC: 0.3 % — ABNORMAL HIGH (ref 0.0–0.2)

## 2019-03-02 LAB — COMPREHENSIVE METABOLIC PANEL
ALT: 18 U/L (ref 0–44)
AST: 14 U/L — ABNORMAL LOW (ref 15–41)
Albumin: 2.2 g/dL — ABNORMAL LOW (ref 3.5–5.0)
Alkaline Phosphatase: 74 U/L (ref 38–126)
Anion gap: 10 (ref 5–15)
BUN: 46 mg/dL — ABNORMAL HIGH (ref 8–23)
CO2: 18 mmol/L — ABNORMAL LOW (ref 22–32)
Calcium: 8 mg/dL — ABNORMAL LOW (ref 8.9–10.3)
Chloride: 106 mmol/L (ref 98–111)
Creatinine, Ser: 1.46 mg/dL — ABNORMAL HIGH (ref 0.44–1.00)
GFR calc Af Amer: 42 mL/min — ABNORMAL LOW (ref >60)
GFR calc non Af Amer: 36 mL/min — ABNORMAL LOW (ref >60)
Glucose, Bld: 448 mg/dL — ABNORMAL HIGH (ref 70–99)
Potassium: 5.2 mmol/L — ABNORMAL HIGH (ref 3.5–5.1)
Sodium: 134 mmol/L — ABNORMAL LOW (ref 135–145)
Total Bilirubin: 0.3 mg/dL (ref 0.3–1.2)
Total Protein: 5.8 g/dL — ABNORMAL LOW (ref 6.5–8.1)

## 2019-03-02 LAB — FERRITIN: Ferritin: 34 ng/mL (ref 11–307)

## 2019-03-02 LAB — GLUCOSE, CAPILLARY
Glucose-Capillary: 304 mg/dL — ABNORMAL HIGH (ref 70–99)
Glucose-Capillary: 377 mg/dL — ABNORMAL HIGH (ref 70–99)
Glucose-Capillary: 356 mg/dL — ABNORMAL HIGH (ref 70–99)
Glucose-Capillary: 285 mg/dL — ABNORMAL HIGH (ref 70–99)

## 2019-03-02 LAB — C-REACTIVE PROTEIN: CRP: 0.6 mg/dL (ref <1.0)

## 2019-03-02 LAB — D-DIMER, QUANTITATIVE: D-Dimer, Quant: 1.3 ug/mL-FEU — ABNORMAL HIGH (ref 0.00–0.50)

## 2019-03-02 MED ORDER — INSULIN GLARGINE 100 UNIT/ML ~~LOC~~ SOLN
15.0000 [IU] | Freq: Two times a day (BID) | SUBCUTANEOUS | Status: DC
  Administered 2019-03-02 – 2019-03-05 (×6): 15 [IU] via SUBCUTANEOUS
  Filled 2019-03-02 (×9): qty 0.15

## 2019-03-02 MED ORDER — AMIODARONE LOAD VIA INFUSION
150.0000 mg | Freq: Once | INTRAVENOUS | Status: AC
  Administered 2019-03-02: 11:00:00 150 mg via INTRAVENOUS
  Filled 2019-03-02: qty 83.34

## 2019-03-02 MED ORDER — DEXAMETHASONE SODIUM PHOSPHATE 10 MG/ML IJ SOLN
6.0000 mg | INTRAMUSCULAR | Status: AC
  Administered 2019-03-03 – 2019-03-04 (×2): 6 mg via INTRAVENOUS
  Filled 2019-03-02 (×2): qty 1

## 2019-03-02 MED ORDER — AMIODARONE HCL IN DEXTROSE 360-4.14 MG/200ML-% IV SOLN
60.0000 mg/h | INTRAVENOUS | Status: AC
  Administered 2019-03-02: 11:00:00 60 mg/h via INTRAVENOUS
  Filled 2019-03-02 (×2): qty 200

## 2019-03-02 MED ORDER — AMIODARONE HCL IN DEXTROSE 360-4.14 MG/200ML-% IV SOLN
30.0000 mg/h | INTRAVENOUS | Status: DC
  Administered 2019-03-02 – 2019-03-03 (×4): 30 mg/h via INTRAVENOUS
  Filled 2019-03-02 (×9): qty 200

## 2019-03-02 NOTE — Progress Notes (Signed)
Dr Norris Cross consult note reviewed, chart and telemetry reviewed. Patient with new onset afib in setting of COVID pneumonia. She is on eliquis 5mg  bid, dilt 30mg  every 6 hours, lopressor 50mg  bid for rate control. Rate control has been affected by low bp's. All dilt doses given, AM lopressor was held. BP's this AM 94/50, HR 120 by vitals.   We will start IV amiodarone, stop oral lopressor at this time, continue oral diltiazem. Plan would be for overall short course of amiodarone, as likely as her severe systemic illness resolves her drive for tachycardia and afib will lessen as well.  MD

## 2019-03-02 NOTE — Progress Notes (Signed)
CSW called pt's Case Worker at [employer] for [employer] [facility]) Miss [name] [name].  Miss [name] [name] states that despite her being referred to in two RN CM's notes as the pt's legal guardian Miss [name] [name] is on fact NOT THE patient's legal guardian and per Miss [name] [name] pt is HER OWN LEGAL GUARDIAN.  Miss [name] [name] Case Workr at the [facility] at ph: [phone] states she has questions about the pt's medications at D/C and would like a call about meds and where to pick them up for the pt at D/C.  1/30: TOC CSW will leave handoff for 1st shift TOC CM/CSW.  CSW will continue to follow for D/C needs.  Dorothe Pea. Astrid Vides, LCSW, LCAS, CSI Transitions of Care Clinical Social Worker Care Coordination Department Ph: [phone]

## 2019-03-02 NOTE — Progress Notes (Signed)
Patient's heart rate remains uncontrolled this morning. Amiodarone initiated per order with bolus administered at 1115. Will continue to monitor.

## 2019-03-02 NOTE — Progress Notes (Signed)
CSW received a call from pt's provider asking if there are indications of a legal guardian.  CSW was unable to see a note or FYI indicating a LG or a need thereof.  Provider updated.  CSW will continue to follow for D/C needs.  [name] [name]. Deshara Rossi, LCSW, LCAS, CSI Transitions of Care Clinical Social Worker Care Coordination Department Ph: [phone]

## 2019-03-02 NOTE — Progress Notes (Signed)
Patient's heart rate 90-110's on amiodarone gtt. Blood pressure maintaining. Will continue to monitor.  Lyndal Pulley, RN 03/02/2019 1:25 PM

## 2019-03-02 NOTE — Progress Notes (Signed)
Pt's blood pressure 94/50. 1000 Metoprolol held.

## 2019-03-02 NOTE — Progress Notes (Signed)
PROGRESS NOTE  [name] [name] LYY:[id number] DOB: [birth year] DOA: 02/22/2019 PCP: Cain Saupe, MD  HPI/Recap of past 91 hours: 69 year old with history of HTN, chronic knee arthritis, CKD stage II came to the ER with complaints of worsening shortness of breath and cough for 7 days.    Hypoxic on presentation with O2 saturation 88% on room air.  COVID-19 screening test positive on 02/22/2019.  Chest x-ray showing bilateral airspace infiltrate.  Started on COVID-19 directed therapies.  Slowly weaned off oxygen.  Intermittently received Lasix which improved her symptoms.  Went into atrial fibrillation with RVR on 02/26/2019.    CHA2DS2-VASc of 3.  She was started on beta-blocker and anticoagulation.  2D echo obtained revealed LVEF 60 to 65% with severely dilated left atrium.  Moderately increased left ventricular posterior wall thickness; left ventricular septal wall thickness was moderately increased.  Cardiology consulted and following.  Ongoing adjustment of rate control medications.  03/02/19: Remains in atrial fibrillation with inadequate rate control, BP low and metoprolol held. Cardiology initiated amiodarone gtt, appreciate recommendations.    Assessment/Plan: Principal Problem:   Acute respiratory failure with hypoxia (HCC) Active Problems:   Hypertension   Knee osteoarthritis   Essential hypertension, benign   Flu-like symptoms   COVID-19   Atrial fibrillation with RVR (HCC)   Acute hypoxic respiratory failure secondary to acute COVID-19 viral pneumonia. Positive COVID-19 test on 02/22/2019.   Not on oxygen supplementation at baseline. Hypoxia has improved and is now on room air with O2 saturation above 92%. Obtain home O2 evaluation prior to DC -Remdesivir-completed 5 days of remdesivir on 02/27/2019. -Solumedrol-D9/10, transitioned to dexamethasone given marked hyperglycemia- last dose 1/31, ordered.  -Routine: Labs have been reviewed including ferritin, CRP, d-dimer     Inflammatory markers are trending down. Continue to trend inflammatory markers -Vitamin C & Zinc, vitamin D3. Prone if needed>16hrs/day.  -procalcitonin-negative, BNP 190 -continue supportive care-antitussive, inhalers, I-S/flutter -CODE STATUS confirmed full code. Out of bed to chair.  Mobilize with assistance and fall precautions. OT/PT- Recommend HH OT Case manager assisting with home health services.  Newly diagnosed atrial fibrillation with RVR CHADs2VAsc 3 Management per cardiology Ongoing adjustment of cardiac medications for rate control. Continue Eliquis for primary CVA prevention 2D echo with normal EF, dilated LA and RA   AKI - Baseline creatinine ~ 1, likely due to diuresis, hold lasix at this time  - Strict I/O - Avoid nephrotoxic agents  -Hyponatremia hyperkalemia likely secondary to hyperglycemia as well as worsening kidney function.  Monitor closely  Hypotension in the setting of new cardiac medications Continue to closely monitor on telemetry Maintain MAP greater than 65 to avoid complications Ongoing adjustment of medications by cardiology.  Elevated TSH - Repeat TSH and T4 in 4-6 weeks, likely due to illness   Prediabetes with marked hyperglycemia due to steroids - A1C is 6.2 - Increased Lantus to 15 U BID---monitor BG closely as she discontinues steroids on 1/31 - Sliding scale and standing mealtime insulin  Continue to monitor CBG Continue carb modified diet  Essential hypertension, hold home agents   Peripheral neuropathy -Gabapentin 3 times daily  Hyperlipidemia -Continue Crestor  Bilateral knee osteoarthritis -Continue local analgesic  DVT prophylaxis: Eliquis  Code Status: Full code    Family Communication: .Patient only---will confirm with LCSW that patient has guardian?    Disposition Plan:  Patient is from home.  Anticipate discharge to home when rate controlled---currently on amiodarone infusion   Consult: Cardiology on  02/28/2019. The patient speaks  [place]  as their primary language.  An interpreter was used for the entire visit.   The patient reports she works all feels okay.  She reports some bothersome symptom is a cough.  She denies dyspnea or chest pain.  She denies dizziness upon standing.  Objective: Vitals:   03/02/19 1230 03/02/19 1245 03/02/19 1315 03/02/19 1330  BP: 103/70 100/71 (!) 107/58 108/79  Pulse: 77 65 92 86  Resp: (!) 21 18 (!) 22 (!) 21  Temp: 97.8 F (36.6 C)     TempSrc: Oral     SpO2: 94% 93% 90% 93%  Weight:      Height:        Intake/Output Summary (Last 24 hours) at 03/02/2019 1415 Last data filed at 03/02/2019 0855 Gross per 24 hour  Intake 480 ml  Output -  Net 480 ml   Filed Weights   02/24/19 1100 02/28/19 0500 03/01/19 0457  Weight: 88.1 kg 91.6 kg 91 kg    Exam:  . General: 69 y.o. year-old adult well-developed well-nourished no acute distress.  Alert oriented x3.   . Cardiovascular: Irregular and tachycardic rate and rhythm with no rubs or gallops. Marland Kitchen Respiratory: Mild rales at bases no wheezing noted.  Good inspiratory effort.  Abdomen: Soft nontender nondistended with bowel sounds present.  Musculoskeletal: No lower extremity edema bilaterally.   Psychiatry: Mood is appropriate for condition and setting.  Data Reviewed: CBC: Recent Labs  Lab 02/26/19 0446 02/27/19 0240 02/28/19 0324 03/01/19 0742 03/02/19 0925  WBC 9.2 11.5* 9.3 10.5 12.0*  NEUTROABS  --   --   --  9.0*  --   HGB 11.3* 11.6* 12.2 11.9* 12.3  HCT 34.4* 34.9* 36.7 34.9* 36.0  MCV 79.6* 78.6* 79.3* 77.7* 78.3*  PLT 242 271 271 248 271   Basic Metabolic Panel: Recent Labs  Lab 02/24/19 0305 02/24/19 0305 02/25/19 0719 02/25/19 0719 02/26/19 0446 02/27/19 0240 02/28/19 0324 03/01/19 0742 03/02/19 0925  NA 139   < > 140   < > 140 137 135 139 134*  K 4.7   < > 4.9   < > 4.8 4.6 4.9 5.1 5.2*  CL 104   < > 105   < > 109 106 106 108 106  CO2 24   < > 23   < > 23 22 21* 24  18*  GLUCOSE 134*   < > 155*   < > 187* 295* 419* 271* 448*  BUN 19   < > 28*   < > 29* 36* 37* 37* 46*  CREATININE 1.01*   < > 1.26*   < > 1.11* 1.34* 1.51* 1.39* 1.46*  CALCIUM 8.7*   < > 8.8*   < > 8.4* 8.4* 8.5* 8.4* 8.0*  MG 1.9  --  2.0  --  2.0 2.1 2.2  --   --    < > = values in this interval not displayed.   GFR: Estimated Creatinine Clearance (by C-G formula based on SCr of 1.46 mg/dL (H)) Female: 17.4 mL/min (A) Female: 47.9 mL/min (A) Liver Function Tests: Recent Labs  Lab 03/01/19 0742 03/02/19 0925  AST 11* 14*  ALT 16 18  ALKPHOS 65 74  BILITOT 0.4 0.3  PROT 5.8* 5.8*  ALBUMIN 2.2* 2.2*   No results for input(s): LIPASE, AMYLASE in the last 168 hours. No results for input(s): AMMONIA in the last 168 hours. Coagulation Profile: No results for input(s): INR, PROTIME in the last 168 hours. Cardiac Enzymes:  No results for input(s): CKTOTAL, CKMB, CKMBINDEX, TROPONINI in the last 168 hours. BNP (last 3 results) No results for input(s): PROBNP in the last 8760 hours. HbA1C: Recent Labs    02/28/19 0324  HGBA1C 6.2*   CBG: Recent Labs  Lab 03/01/19 1647 03/01/19 1759 03/01/19 2104 03/02/19 0805 03/02/19 1139  GLUCAP 425* 378* 210* 304* 377*   Lipid Profile: No results for input(s): CHOL, HDL, LDLCALC, TRIG, CHOLHDL, LDLDIRECT in the last 72 hours. Thyroid Function Tests: Recent Labs    02/28/19 0324  FREET4 1.47*   Anemia Panel: Recent Labs    03/01/19 0742 03/02/19 0324  FERRITIN 31 34   Urine analysis:    Component Value Date/Time   COLORURINE YELLOW 02/22/2019 2241   APPEARANCEUR CLEAR 02/22/2019 2241   LABSPEC 1.008 02/22/2019 2241   PHURINE 8.0 02/22/2019 2241   GLUCOSEU NEGATIVE 02/22/2019 2241   HGBUR NEGATIVE 02/22/2019 2241   BILIRUBINUR NEGATIVE 02/22/2019 2241   KETONESUR NEGATIVE 02/22/2019 2241   PROTEINUR NEGATIVE 02/22/2019 2241   NITRITE NEGATIVE 02/22/2019 2241   LEUKOCYTESUR NEGATIVE 02/22/2019 2241   Sepsis Labs:  @[email or web address] (procalcitonin:4,lacticidven:4)  ) Recent Results (from the past 240 hour(s))  SARS CORONAVIRUS 2 (TAT 6-24 HRS) Nasopharyngeal Nasopharyngeal Swab     Status: Abnormal   Collection Time: 02/22/19  4:27 PM   Specimen: Nasopharyngeal Swab  Result Value Ref Range Status   SARS Coronavirus 2 POSITIVE (A) NEGATIVE Final    Comment: RESULT CALLED TO, READ BACK BY AND VERIFIED WITH: D.YAO MD 2115 02/22/19 MCCORMICK K (NOTE) SARS-CoV-2 target nucleic acids are DETECTED. The SARS-CoV-2 RNA is generally detectable in upper and lower respiratory specimens during the acute phase of infection. Positive results are indicative of the presence of SARS-CoV-2 RNA. Clinical correlation with patient history and other diagnostic information is  necessary to determine patient infection status. Positive results do not rule out bacterial infection or co-infection with other viruses.  The expected result is Negative. Fact Sheet for Patients: 02/24/19 Fact Sheet for Healthcare Providers: HairSlick.no This test is not yet approved or cleared by the quierodirigir.com FDA and  has been authorized for detection and/or diagnosis of SARS-CoV-2 by FDA under an Emergency Use Authorization (EUA). This EUA will remain  in effect (meaning this test can be used) for the  duration of the COVID-19 declaration under Section 564(b)(1) of the Act, 21 U.S.C. section 360bbb-3(b)(1), unless the authorization is terminated or revoked sooner. Performed at [facility], [address], [place], [place]   Blood Culture (routine x 2)     Status: None   Collection Time: 02/22/19  7:09 PM   Specimen: BLOOD  Result Value Ref Range Status   Specimen Description BLOOD RIGHT ANTECUBITAL  Final   Special Requests   Final    BOTTLES DRAWN AEROBIC AND ANAEROBIC Blood Culture adequate volume   Culture   Final    NO GROWTH 5 DAYS Performed at [facility], [address] [place], [place]    Report Status 02/27/2019 FINAL  Final  Culture, Urine     Status: Abnormal   Collection Time: 02/22/19 10:41 PM   Specimen: Urine, Random  Result Value Ref Range Status   Specimen Description URINE, RANDOM  Final   Special Requests   Final    NONE Performed at [facility], [address] [place], [place]    Culture (A)  Final    40,000 COLONIES/mL ESCHERICHIA COLI 40,000  COLONIES/mL KLEBSIELLA PNEUMONIAE Confirmed Extended Spectrum Beta-Lactamase Producer (ESBL).  In bloodstream infections from ESBL organisms, carbapenems are preferred over piperacillin/tazobactam. They are shown to have a lower risk of mortality.    Report Status 02/25/2019 FINAL  Final   Organism ID, Bacteria ESCHERICHIA COLI (A)  Final   Organism ID, Bacteria KLEBSIELLA PNEUMONIAE (A)  Final      Susceptibility   Escherichia coli - MIC*    AMPICILLIN >=32 RESISTANT Resistant     CEFAZOLIN >=64 RESISTANT Resistant     CEFTRIAXONE >=64 RESISTANT Resistant     CIPROFLOXACIN <=0.25 SENSITIVE Sensitive     GENTAMICIN >=16 RESISTANT Resistant     IMIPENEM <=0.25 SENSITIVE Sensitive     NITROFURANTOIN <=16 SENSITIVE Sensitive     TRIMETH/SULFA >=320 RESISTANT Resistant     AMPICILLIN/SULBACTAM >=32 RESISTANT Resistant     PIP/TAZO 8 SENSITIVE Sensitive     * 40,000 COLONIES/mL ESCHERICHIA COLI   Klebsiella pneumoniae - MIC*    AMPICILLIN >=32 RESISTANT Resistant     CEFAZOLIN >=64 RESISTANT Resistant     CEFTRIAXONE >=64 RESISTANT Resistant     CIPROFLOXACIN 0.5 SENSITIVE Sensitive     GENTAMICIN <=1 SENSITIVE Sensitive     IMIPENEM <=0.25 SENSITIVE Sensitive     NITROFURANTOIN 128 RESISTANT Resistant     TRIMETH/SULFA >=320 RESISTANT Resistant     AMPICILLIN/SULBACTAM >=32 RESISTANT Resistant     PIP/TAZO 8 SENSITIVE Sensitive     * 40,000 COLONIES/mL KLEBSIELLA PNEUMONIAE  Blood Culture (routine x 2)     Status: None   Collection  Time: 02/23/19  2:16 AM   Specimen: BLOOD LEFT HAND  Result Value Ref Range Status   Specimen Description BLOOD LEFT HAND  Final   Special Requests   Final    AEROBIC BOTTLE ONLY Blood Culture results may not be optimal due to an inadequate volume of blood received in culture bottles   Culture   Final    NO GROWTH 5 DAYS Performed at [facility], [address], [place], [place] [address]    Report Status 02/28/2019 FINAL  Final      Studies: No results found.  Scheduled Meds: . apixaban  5 mg Oral BID  . ascorbic acid  500 mg Oral BID  . diltiazem  30 mg Oral Q6H  . fluticasone  2 spray Each Nare Daily  . gabapentin  100 mg Oral TID  . guaiFENesin  1,200 mg Oral BID  . insulin aspart  0-15 Units Subcutaneous TID WC  . insulin aspart  0-5 Units Subcutaneous QHS  . insulin aspart  10 Units Subcutaneous TID WC  . insulin glargine  10 Units Subcutaneous BID  . Ipratropium-Albuterol  2 puff Inhalation TID  . loratadine  10 mg Oral Daily  . mouth rinse  15 mL Mouth Rinse BID  . methylPREDNISolone (SOLU-MEDROL) injection  40 mg Intravenous Q12H  . pantoprazole  40 mg Oral Q0600  . rosuvastatin  10 mg Oral Daily  . senna  1 tablet Oral BID  . sodium chloride flush  3 mL Intravenous Q12H  . triamcinolone cream   Topical BID  . zinc sulfate  220 mg Oral Daily    Continuous Infusions: . amiodarone 60 mg/hr (03/02/19 1115)   Followed by  . amiodarone       LOS: 8 days     Martyn Malay, MD [facility] Pager [phone]  If 7PM-7AM, please contact night-coverage [email or web address] Password [email or web address] 03/02/2019, 2:15 PM

## 2019-03-03 LAB — COMPREHENSIVE METABOLIC PANEL
ALT: 20 U/L (ref 0–44)
AST: 17 U/L (ref 15–41)
Albumin: 2.3 g/dL — ABNORMAL LOW (ref 3.5–5.0)
Alkaline Phosphatase: 60 U/L (ref 38–126)
Anion gap: 8 (ref 5–15)
BUN: 39 mg/dL — ABNORMAL HIGH (ref 8–23)
CO2: 22 mmol/L (ref 22–32)
Calcium: 8.2 mg/dL — ABNORMAL LOW (ref 8.9–10.3)
Chloride: 106 mmol/L (ref 98–111)
Creatinine, Ser: 1.34 mg/dL — ABNORMAL HIGH (ref 0.44–1.00)
GFR calc Af Amer: 46 mL/min — ABNORMAL LOW (ref >60)
GFR calc non Af Amer: 40 mL/min — ABNORMAL LOW (ref >60)
Glucose, Bld: 169 mg/dL — ABNORMAL HIGH (ref 70–99)
Potassium: 5 mmol/L (ref 3.5–5.1)
Sodium: 136 mmol/L (ref 135–145)
Total Bilirubin: 0.6 mg/dL (ref 0.3–1.2)
Total Protein: 5.9 g/dL — ABNORMAL LOW (ref 6.5–8.1)

## 2019-03-03 LAB — D-DIMER, QUANTITATIVE: D-Dimer, Quant: 1.33 ug/mL-FEU — ABNORMAL HIGH (ref 0.00–0.50)

## 2019-03-03 LAB — GLUCOSE, CAPILLARY
Glucose-Capillary: 150 mg/dL — ABNORMAL HIGH (ref 70–99)
Glucose-Capillary: 198 mg/dL — ABNORMAL HIGH (ref 70–99)
Glucose-Capillary: 213 mg/dL — ABNORMAL HIGH (ref 70–99)
Glucose-Capillary: 254 mg/dL — ABNORMAL HIGH (ref 70–99)

## 2019-03-03 LAB — C-REACTIVE PROTEIN: CRP: 0.7 mg/dL (ref <1.0)

## 2019-03-03 LAB — FERRITIN: Ferritin: 31 ng/mL (ref 11–307)

## 2019-03-03 MED ORDER — AMIODARONE LOAD VIA INFUSION
150.0000 mg | Freq: Once | INTRAVENOUS | Status: AC
  Administered 2019-03-03: 150 mg via INTRAVENOUS
  Filled 2019-03-03: qty 83.34

## 2019-03-03 NOTE — Progress Notes (Signed)
Dr Norris Cross consult note reviewed, chart and telemetry reviewed. Patient with new onset afib in setting of COVID pneumonia. She is on eliquis 5mg  bid. Rate control has been complicated by low bp's. Lopressor stopped yesterday, continued on diltiazem, started on IV amiodarone.    BP's soft but improved. Able to get all of her dilt scheduled doses. Amio bolused at 150, now on drip at rate 30mg /hr. Telemetry shows afib variable rates 90s to 130s. Rebolus amio 150mg  today, contniue drip. Continue oral diltiazem. Plan would be for overall short course of amiodarone, as likely as her severe systemic illness resolves her drive for tachycardia and afib will lessen as well.   MD

## 2019-03-03 NOTE — Progress Notes (Signed)
Patients heart rate controlled on amio gtt while resting. When patient does any movement, such as eating, HR rises to 110's-120's.

## 2019-03-03 NOTE — Progress Notes (Signed)
PROGRESS NOTE    [name] [name]  ERD:[id number] DOB: Dec 11, 1950 DOA: 02/22/2019 PCP: Antony Blackbird, MD    Brief Narrative:  69 year old with history of HTN, chronic knee arthritis, CKD stage II came to the ER with complaints of worsening shortness of breath and cough for 7 days.   Hypoxic on presentation with O2 saturation 88% on room air.  COVID-19 screening test positive on 02/22/2019.  Chest x-ray showing bilateral airspace infiltrate.  Started on COVID-19 directed therapies.  Slowly weaned off oxygen. Intermittently received Lasix which improved her symptoms. Went into atrial fibrillation with RVR on 02/26/2019.  CHA2DS2-VASc of 3.  She was started on beta-blocker and anticoagulation.  2D echo obtained revealed LVEF 60 to 65% with severely dilated left atrium.  Moderately increased left ventricular posterior wall thickness; left ventricular septal wall thickness was moderately increased.  Cardiology consulted and following.  Ongoing adjustment of rate control medications.  03/03/19: Remains in atrial fibrillation with inadequate rate control, BP low and metoprolol held. Cardiology initiated amiodarone gtt, appreciate recommendations.    Assessment & Plan:   Principal Problem:   Acute respiratory failure with hypoxia (HCC) Active Problems:   Hypertension   Knee osteoarthritis   Essential hypertension, benign   COVID-19   Atrial fibrillation with RVR (HCC)   Acute kidney injury ([place])  Acute hypoxic respiratory failure secondary to acute COVID-19 viral pneumonia. Positive COVID-19 test on 02/22/2019.   Not on oxygen supplementation at baseline. Hypoxia has improved and is now on room air with O2 saturation above 92%. Obtain home O2 evaluation prior to DC -Remdesivir-completed 5 days of remdesivir on 02/27/2019. -Solumedrol-D10/10, transitioned to dexamethasone given marked hyperglycemia- last dose 1/31.  -Routine: Labs have been reviewed including ferritin, CRP, d-dimer     Inflammatory markers are trending down. Continue to trend inflammatory markers -Vitamin C &Zinc, vitamin D3. Prone if needed>16hrs/day.  -procalcitonin-negative, BNP 190 -continue supportive care-antitussive, inhalers, I-S/flutter -CODE STATUS confirmed full code. Out of bed to chair.  Mobilize with assistance and fall precautions. OT/PT- Recommend HH OT Case manager assisting with home health services.  Newly diagnosed atrial fibrillation with RVR CHADs2VAsc 3 Management per cardiology--on amiodarone gtt now Ongoing adjustment of cardiac medications for rate control. Continue Eliquis for primary CVA prevention 2D echo with normal EF, dilated LA and RA   AKI - Baseline creatinine ~ 1, likely due to diuresis, hold lasix at this time  - Strict I/O - Avoid nephrotoxic agents  -Hyponatremia hyperkalemia likely secondary to hyperglycemia as well as worsening kidney function.  Monitor closely  Hypotension in the setting of new cardiac medications Continue to closely monitor on telemetry Maintain MAP greater than 65 to avoid complications Ongoing adjustment of medications by cardiology.  Elevated TSH - Repeat TSH and T4 in 4-6 weeks, likely due to illness   Prediabetes with marked hyperglycemia due to steroids - A1C is 6.2 - Increased Lantus to 15 U BID---monitor BG closely as she discontinues steroids on 1/31 - Sliding scale and standing mealtime insulin  Continue to monitor CBG Continue carb modified diet  Essential hypertension, hold home agents   Peripheral neuropathy -Gabapentin 3 times daily  Hyperlipidemia -Continue Crestor  Bilateral knee osteoarthritis -Continue local analgesic  DVT prophylaxis: HU:[id number] Code Status: Full code  Family Communication: Patient Disposition Plan: home once off drips and pending continued improvement  Consultants:   Cardiology  Procedures:  ECHO  Antimicrobials: Anti-infectives (From admission, onward)   Start      Dose/Rate Route Frequency Ordered Stop  02/25/19 0800  cephALEXin (KEFLEX) capsule 500 mg  Status:  Discontinued     500 mg Oral Every 8 hours 02/25/19 0749 02/25/19 1214   02/24/19 1000  remdesivir 100 mg in sodium chloride 0.9 % 100 mL IVPB     100 mg 200 mL/hr over 30 Minutes Intravenous Daily 02/23/19 0756 02/27/19 1900   02/23/19 0900  remdesivir 200 mg in sodium chloride 0.9% 250 mL IVPB     200 mg 580 mL/hr over 30 Minutes Intravenous Once 02/23/19 0756 02/23/19 2159       Subjective: Feels well. Mild cough. Breathing feels normal. Off O2. Completed Covid meds  Objective: Vitals:   03/02/19 2100 03/03/19 0200 03/03/19 0802 03/03/19 0954  BP: 104/73 101/68 107/71 95/66  Pulse: 75 68 77 79  Resp: 18 17 19 19   Temp:      TempSrc:      SpO2: 95% 93% 98% 92%  Weight:      Height:        Intake/Output Summary (Last 24 hours) at 03/03/2019 1034 Last data filed at 03/03/2019 0900 Gross per 24 hour  Intake 897.95 ml  Output -  Net 897.95 ml   Filed Weights   02/24/19 1100 02/28/19 0500 03/01/19 0457  Weight: 88.1 kg 91.6 kg 91 kg    Examination:  General exam: Appears calm and comfortable  Respiratory system: Clear to auscultation. Respiratory effort normal. Cardiovascular system: S1 & S2 heard, RRR.  Gastrointestinal system: Abdomen is nondistended, soft and nontender.  Central nervous system: Alert and oriented. No focal neurological deficits. Extremities: Symmetric  Skin: No rashes Psychiatry: Judgement and insight appear normal. Mood & affect appropriate.   Data Reviewed: I have personally reviewed following labs and imaging studies  CBC: Recent Labs  Lab 02/26/19 0446 02/27/19 0240 02/28/19 0324 03/01/19 0742 03/02/19 0925  WBC 9.2 11.5* 9.3 10.5 12.0*  NEUTROABS  --   --   --  9.0*  --   HGB 11.3* 11.6* 12.2 11.9* 12.3  HCT 34.4* 34.9* 36.7 34.9* 36.0  MCV 79.6* 78.6* 79.3* 77.7* 78.3*  PLT 242 271 271 248 271   Basic Metabolic Panel: Recent  Labs  Lab 02/25/19 0719 02/25/19 0719 02/26/19 0446 02/27/19 0240 02/28/19 0324 03/01/19 0742 03/02/19 0925  NA 140   < > 140 137 135 139 134*  K 4.9   < > 4.8 4.6 4.9 5.1 5.2*  CL 105   < > 109 106 106 108 106  CO2 23   < > 23 22 21* 24 18*  GLUCOSE 155*   < > 187* 295* 419* 271* 448*  BUN 28*   < > 29* 36* 37* 37* 46*  CREATININE 1.26*   < > 1.11* 1.34* 1.51* 1.39* 1.46*  CALCIUM 8.8*   < > 8.4* 8.4* 8.5* 8.4* 8.0*  MG 2.0  --  2.0 2.1 2.2  --   --    < > = values in this interval not displayed.   GFR: Estimated Creatinine Clearance (by C-G formula based on SCr of 1.46 mg/dL (H)) Female: 03/04/19 mL/min (A) Female: 47.9 mL/min (A) Liver Function Tests: Recent Labs  Lab 03/01/19 0742 03/02/19 0925  AST 11* 14*  ALT 16 18  ALKPHOS 65 74  BILITOT 0.4 0.3  PROT 5.8* 5.8*  ALBUMIN 2.2* 2.2*   CBG: Recent Labs  Lab 03/02/19 0805 03/02/19 1139 03/02/19 1619 03/02/19 2134 03/03/19 0739  GLUCAP 304* 377* 356* 285* 150*   Anemia Panel: Recent Labs  03/02/19 0324 03/03/19 0205  FERRITIN 34 31   Sepsis Labs: No results for input(s): PROCALCITON, LATICACIDVEN in the last 168 hours.  Recent Results (from the past 240 hour(s))  SARS CORONAVIRUS 2 (TAT 6-24 HRS) Nasopharyngeal Nasopharyngeal Swab     Status: Abnormal   Collection Time: 02/22/19  4:27 PM   Specimen: Nasopharyngeal Swab  Result Value Ref Range Status   SARS Coronavirus 2 POSITIVE (A) NEGATIVE Final    Comment: RESULT CALLED TO, READ BACK BY AND VERIFIED WITH: D.YAO MD 2115 02/22/19 MCCORMICK K (NOTE) SARS-CoV-2 target nucleic acids are DETECTED. The SARS-CoV-2 RNA is generally detectable in upper and lower respiratory specimens during the acute phase of infection. Positive results are indicative of the presence of SARS-CoV-2 RNA. Clinical correlation with patient history and other diagnostic information is  necessary to determine patient infection status. Positive results do not rule out bacterial  infection or co-infection with other viruses.  The expected result is Negative. Fact Sheet for Patients: HairSlick.no Fact Sheet for Healthcare Providers: quierodirigir.com This test is not yet approved or cleared by the [place] FDA and  has been authorized for detection and/or diagnosis of SARS-CoV-2 by FDA under an Emergency Use Authorization (EUA). This EUA will remain  in effect (meaning this test can be used) for the  duration of the COVID-19 declaration under Section 564(b)(1) of the Act, 21 U.S.C. section 360bbb-3(b)(1), unless the authorization is terminated or revoked sooner. Performed at [facility], [address] [place], [place] [address]   Blood Culture (routine x 2)     Status: None   Collection Time: 02/22/19  7:09 PM   Specimen: BLOOD  Result Value Ref Range Status   Specimen Description BLOOD RIGHT ANTECUBITAL  Final   Special Requests   Final    BOTTLES DRAWN AEROBIC AND ANAEROBIC Blood Culture adequate volume   Culture   Final    NO GROWTH 5 DAYS Performed at [facility], [address], [place], [place] [address]    Report Status 02/27/2019 FINAL  Final  Culture, Urine     Status: Abnormal   Collection Time: 02/22/19 10:41 PM   Specimen: Urine, Random  Result Value Ref Range Status   Specimen Description URINE, RANDOM  Final   Special Requests   Final    NONE Performed at [facility], [address] [place], [place] [address]    Culture (A)  Final    40,000 COLONIES/mL ESCHERICHIA COLI 40,000 COLONIES/mL KLEBSIELLA PNEUMONIAE Confirmed Extended Spectrum Beta-Lactamase Producer (ESBL).  In bloodstream infections from ESBL organisms, carbapenems are preferred over piperacillin/tazobactam. They are shown to have a lower risk of mortality.    Report Status 02/25/2019 FINAL  Final   Organism ID, Bacteria ESCHERICHIA COLI (A)  Final   Organism ID, Bacteria KLEBSIELLA PNEUMONIAE  (A)  Final      Susceptibility   Escherichia coli - MIC*    AMPICILLIN >=32 RESISTANT Resistant     CEFAZOLIN >=64 RESISTANT Resistant     CEFTRIAXONE >=64 RESISTANT Resistant     CIPROFLOXACIN <=0.25 SENSITIVE Sensitive     GENTAMICIN >=16 RESISTANT Resistant     IMIPENEM <=0.25 SENSITIVE Sensitive     NITROFURANTOIN <=16 SENSITIVE Sensitive     TRIMETH/SULFA >=320 RESISTANT Resistant     AMPICILLIN/SULBACTAM >=32 RESISTANT Resistant     PIP/TAZO 8 SENSITIVE Sensitive     * 40,000 COLONIES/mL ESCHERICHIA COLI   Klebsiella pneumoniae - MIC*    AMPICILLIN >=  32 RESISTANT Resistant     CEFAZOLIN >=64 RESISTANT Resistant     CEFTRIAXONE >=64 RESISTANT Resistant     CIPROFLOXACIN 0.5 SENSITIVE Sensitive     GENTAMICIN <=1 SENSITIVE Sensitive     IMIPENEM <=0.25 SENSITIVE Sensitive     NITROFURANTOIN 128 RESISTANT Resistant     TRIMETH/SULFA >=320 RESISTANT Resistant     AMPICILLIN/SULBACTAM >=32 RESISTANT Resistant     PIP/TAZO 8 SENSITIVE Sensitive     * 40,000 COLONIES/mL KLEBSIELLA PNEUMONIAE  Blood Culture (routine x 2)     Status: None   Collection Time: 02/23/19  2:16 AM   Specimen: BLOOD LEFT HAND  Result Value Ref Range Status   Specimen Description BLOOD LEFT HAND  Final   Special Requests   Final    AEROBIC BOTTLE ONLY Blood Culture results may not be optimal due to an inadequate volume of blood received in culture bottles   Culture   Final    NO GROWTH 5 DAYS Performed at [facility], [address], [place], [place] [address]    Report Status 02/28/2019 FINAL  Final      Radiology Studies: No results found.   Scheduled Meds: . apixaban  5 mg Oral BID  . ascorbic acid  500 mg Oral BID  . dexamethasone (DECADRON) injection  6 mg Intravenous Q24H  . diltiazem  30 mg Oral Q6H  . fluticasone  2 spray Each Nare Daily  . gabapentin  100 mg Oral TID  . guaiFENesin  1,200 mg Oral BID  . insulin aspart  0-15 Units Subcutaneous TID WC  . insulin aspart  0-5  Units Subcutaneous QHS  . insulin aspart  10 Units Subcutaneous TID WC  . insulin glargine  15 Units Subcutaneous BID  . Ipratropium-Albuterol  2 puff Inhalation TID  . loratadine  10 mg Oral Daily  . mouth rinse  15 mL Mouth Rinse BID  . pantoprazole  40 mg Oral Q0600  . rosuvastatin  10 mg Oral Daily  . senna  1 tablet Oral BID  . sodium chloride flush  3 mL Intravenous Q12H  . triamcinolone cream   Topical BID  . zinc sulfate  220 mg Oral Daily   Continuous Infusions: . amiodarone 30 mg/hr (03/03/19 0952)     LOS: 9 days    Reva Bores, MD 03/03/2019 10:34 AM [phone] [facility] If 7PM-7AM, please contact night-coverage 03/03/2019, 10:34 AM

## 2019-03-04 LAB — COMPREHENSIVE METABOLIC PANEL
ALT: 23 U/L (ref 0–44)
AST: 18 U/L (ref 15–41)
Albumin: 2.3 g/dL — ABNORMAL LOW (ref 3.5–5.0)
Alkaline Phosphatase: 61 U/L (ref 38–126)
Anion gap: 8 (ref 5–15)
BUN: 39 mg/dL — ABNORMAL HIGH (ref 8–23)
CO2: 21 mmol/L — ABNORMAL LOW (ref 22–32)
Calcium: 8.2 mg/dL — ABNORMAL LOW (ref 8.9–10.3)
Chloride: 106 mmol/L (ref 98–111)
Creatinine, Ser: 1.49 mg/dL — ABNORMAL HIGH (ref 0.44–1.00)
GFR calc Af Amer: 41 mL/min — ABNORMAL LOW (ref >60)
GFR calc non Af Amer: 35 mL/min — ABNORMAL LOW (ref >60)
Glucose, Bld: 172 mg/dL — ABNORMAL HIGH (ref 70–99)
Potassium: 5.1 mmol/L (ref 3.5–5.1)
Sodium: 135 mmol/L (ref 135–145)
Total Bilirubin: 0.9 mg/dL (ref 0.3–1.2)
Total Protein: 5.7 g/dL — ABNORMAL LOW (ref 6.5–8.1)

## 2019-03-04 LAB — GLUCOSE, CAPILLARY
Glucose-Capillary: 501 mg/dL (ref 70–99)
Glucose-Capillary: 136 mg/dL — ABNORMAL HIGH (ref 70–99)
Glucose-Capillary: 165 mg/dL — ABNORMAL HIGH (ref 70–99)
Glucose-Capillary: 176 mg/dL — ABNORMAL HIGH (ref 70–99)
Glucose-Capillary: 156 mg/dL — ABNORMAL HIGH (ref 70–99)

## 2019-03-04 LAB — C-REACTIVE PROTEIN: CRP: 0.6 mg/dL (ref <1.0)

## 2019-03-04 LAB — FERRITIN: Ferritin: 36 ng/mL (ref 11–307)

## 2019-03-04 LAB — D-DIMER, QUANTITATIVE: D-Dimer, Quant: 1.31 ug/mL-FEU — ABNORMAL HIGH (ref 0.00–0.50)

## 2019-03-04 NOTE — Plan of Care (Signed)
Amiodarone gtt on hold at this time. Gtt was infusing at 16.71mL/hr. HR down into the 50s. Provider on-call notified of HR and recommended holding gtt. Patient is asymptomatic at this time, denies complaints of dizziness, fatigue or SOB. Will continue to monitor.

## 2019-03-04 NOTE — Progress Notes (Addendum)
Physical Therapy Treatment Patient Details Name: [name] [name] MRN: [id number] DOB: 09-03-1950 Today's Date: 03/04/2019    History of Present Illness Pt is a 69 y.o. female admitted 02/22/19 with worsening SOB and cough; (+) COVID-19. CXR indicated Bilateral airspace disease; favor congestive heart failure and edema over PNA. 02/25/19 developed afib with RVR PMH includes  HTN, chronic knee pain, CKD II.    PT Comments    Pt admitted with above diagnosis. Pt was able to ambulate with RW and incr distance today.  Pt HR stable as well.  No LOB with RW and encouaraged pt to use her RW at home and she agrees.  Pt did have bil Knee pain and Nurse and MD aware.  MD:  Pt did ask if she could have a diet to be put on to lose weight.  Told pt This PT would ask MD.  Also pt asked if a SW could find her a place to stay where there were not so many people.  Pt also asked if she can have meds for blood circulation  - MD please address above with pt.  Pt currently with functional limitations due to balance and endurance deficits. Pt will benefit from skilled PT to increase their independence and safety with mobility to allow discharge to the venue listed below.     Follow Up Recommendations  Home health PT;Supervision - Intermittent     Equipment Recommendations  None recommended by PT    Recommendations for Other Services OT consult     Precautions / Restrictions Precautions Precautions: Fall Restrictions Weight Bearing Restrictions: No    Mobility  Bed Mobility Overal bed mobility: Modified Independent Bed Mobility: Supine to Sit     Supine to sit: Modified independent (Device/Increase time)     General bed mobility comments: use of bed rail to pull to EoB  Transfers Overall transfer level: Needs assistance Equipment used: None Transfers: Sit to/from [employer] Sit to Stand: Min guard Stand pivot transfers: Min guard       General transfer comment: min guard  for power up and steadying, assist to manage lines during transfer  Ambulation/Gait Ambulation/Gait assistance: Min guard Gait Distance (Feet): 200 Feet Assistive device: Rolling walker (2 wheeled) Gait Pattern/deviations: Step-through pattern;Decreased stride length;Trunk flexed Gait velocity: slowed Gait velocity interpretation: <1.31 ft/sec, indicative of household ambulator General Gait Details: Pt did well with RW. States she has one at home she will use   Social research officer, government [employer] (Stroke Patients Only)       Balance Overall balance assessment: Needs assistance Sitting-balance support: Feet supported;No upper extremity supported Sitting balance-Leahy Scale: Good     Standing balance support: During functional activity;Bilateral upper extremity supported;No upper extremity supported Standing balance-Leahy Scale: Fair Standing balance comment: close min guard for safety                            Cognition Arousal/Alertness: Awake/alert Behavior During Therapy: WFL for tasks assessed/performed Overall Cognitive Status: Difficult to assess                                 General Comments: utilized Engineer, civil (consulting) [phone]      Exercises General Exercises - Lower Extremity Ankle Circles/Pumps: AROM;Both;5 reps;Supine Long Arc Quad: AROM;Both;10 reps;Seated  General Comments General comments (skin integrity, edema, etc.): Hr 66-89 bpm      Pertinent Vitals/Pain Pain Assessment: 0-10 Pain Score: 10-Worst pain ever Pain Location: bil knees  Pain Descriptors / Indicators: Aching;Discomfort;Sore;Grimacing;Guarding Pain Intervention(s): Limited activity within patient's tolerance;Monitored during session;Repositioned    Home Living                      Prior Function            PT Goals (current goals can now be found in the care plan section) Acute Rehab PT Goals Patient  Stated Goal: home soon Progress towards PT goals: Progressing toward goals    Frequency    Min 3X/week      PT Plan Current plan remains appropriate    Co-evaluation              AM-PAC PT "6 Clicks" Mobility   Outcome Measure  Help needed turning from your back to your side while in a flat bed without using bedrails?: None Help needed moving from lying on your back to sitting on the side of a flat bed without using bedrails?: None Help needed moving to and from a bed to a chair (including a wheelchair)?: None Help needed standing up from a chair using your arms (e.g., wheelchair or bedside chair)?: None Help needed to walk in hospital room?: A Little Help needed climbing 3-5 steps with a railing? : A Little 6 Click Score: 22    End of Session Equipment Utilized During Treatment: Gait belt Activity Tolerance: Patient limited by fatigue Patient left: in chair;with call bell/phone within reach Nurse Communication: Mobility status PT Visit Diagnosis: Other abnormalities of gait and mobility (R26.89)     Time: 9371-6967 PT Time Calculation (min) (ACUTE ONLY): 20 min  Charges:  $Gait Training: 8-22 mins                     [name] [name] W,PT Acute Rehabilitation Services Pager:  [phone]  Office:  [place] 03/04/2019, 1:52 PM

## 2019-03-04 NOTE — Progress Notes (Signed)
PROGRESS NOTE  [name] [name] ZSW:[id number] DOB: 1950-10-11 DOA: 02/22/2019 PCP: Cain Saupe, MD  Brief History   The patient is a 69 yr old woman who presented to [facility] on 02/22/2019 with worsening SOB and cough x 7 days. . She was found to be COVID-19 positive and hypoxic due to bilateral airspace disease on CXR suggestive of pulmonary edema over PNA. She was admitted to a telemetry bed and treated with remdesivir, steroids, and supplementation with Vitamin C, Zinc, and Vitamin D3. Procalcitonin was negative.  On 02/25/2019 the patient was found to be in atrial fibrillation with RVR. Cardiology has been consulted. The patient is currently receiving oral diltiazem and IV amiodarone. Heart rate is under fair control, if a little low, at rest, and oxygen requirements are at baseline. Cardiology plans for a short course of amiodarone as they feel that cause for atrial fibrillation will decrease as her COVID-19 symptoms resolve.  Consultants  . Cardiology  Procedures  . None  Antibiotics   Anti-infectives (From admission, onward)   Start     Dose/Rate Route Frequency Ordered Stop   02/25/19 0800  cephALEXin (KEFLEX) capsule 500 mg  Status:  Discontinued     500 mg Oral Every 8 hours 02/25/19 0749 02/25/19 1214   02/24/19 1000  remdesivir 100 mg in sodium chloride 0.9 % 100 mL IVPB     100 mg 200 mL/hr over 30 Minutes Intravenous Daily 02/23/19 0756 02/27/19 1900   02/23/19 0900  remdesivir 200 mg in sodium chloride 0.9% 250 mL IVPB     200 mg 580 mL/hr over 30 Minutes Intravenous Once 02/23/19 0756 02/23/19 2159    .   Subjective  Patient is complaining of left knee pain. This is not a new pain for her, but she states that it is worse as she is spending so much time in bed.  Objective   Vitals:  Vitals:   03/04/19 0547 03/04/19 0550  BP: 122/81 122/81  Pulse:    Resp:    Temp:    SpO2:     Exam:  Constitutional:  . The patient is awake, alert, and oriented x 3. No  acute distress. Respiratory:  . No increased work of breathing. . No wheezes, rales, or rhonchi . No tactile fremitus Cardiovascular:  . Irregular rate and rhythm, slow rate . No murmurs, ectopy, or gallups. . No lateral PMI. No thrills. Abdomen:  . Abdomen is soft, non-tender, non-distended . No hernias, masses, or organomegaly . Normoactive bowel sounds.  Musculoskeletal:  . No cyanosis, clubbing, or edema Skin:  . No rashes, lesions, ulcers . palpation of skin: no induration or nodules Neurologic:  . CN 2-12 intact . Sensation all 4 extremities intact Psychiatric:  . Mental status o Mood, affect appropriate o Orientation to person, place, time  . judgment and insight appear intact I have personally reviewed the following:   Today's Data  . Vitals, CMP  Micro Data  . Urine culture positive for e. Coli.  . Blood cultures negative for growth  Imaging  . CXR 02/22/2019  Cardiology Data  . EKG 02/22/2019 and 02/27/2019  Scheduled Meds: . apixaban  5 mg Oral BID  . ascorbic acid  500 mg Oral BID  . diltiazem  30 mg Oral Q6H  . fluticasone  2 spray Each Nare Daily  . gabapentin  100 mg Oral TID  . guaiFENesin  1,200 mg Oral BID  . insulin aspart  0-15 Units Subcutaneous TID WC  . insulin  aspart  0-5 Units Subcutaneous QHS  . insulin aspart  10 Units Subcutaneous TID WC  . insulin glargine  15 Units Subcutaneous BID  . Ipratropium-Albuterol  2 puff Inhalation TID  . loratadine  10 mg Oral Daily  . mouth rinse  15 mL Mouth Rinse BID  . pantoprazole  40 mg Oral Q0600  . rosuvastatin  10 mg Oral Daily  . senna  1 tablet Oral BID  . sodium chloride flush  3 mL Intravenous Q12H  . triamcinolone cream   Topical BID  . zinc sulfate  220 mg Oral Daily   Continuous Infusions: . amiodarone 30 mg/hr (03/04/19 0156)    Principal Problem:   Acute respiratory failure with hypoxia (HCC) Active Problems:   Hypertension   Knee osteoarthritis   Essential hypertension,  benign   COVID-19   Atrial fibrillation with RVR ([place])   Acute kidney injury ([place])   LOS: 10 days   A & P  Acute hypoxic respiratory failure secondary to acute COVID-19 and pulmonary edema Positive COVID-19 test on 02/22/2019. Pt currently saturating at 92% on room air. She has completed 5 days of remdesivir on 02/27/2019. She has completed 10 days of 10 on steroids. Last day was 03/03/2019. CRP and ferritin are trending down. Procalcitonin is negative. We will continue supportive care-antitussive, inhalers, I-S/flutter. The patient will be mobilized with assistance and fall precautions. Pt has recommended discharge to home with [facility] OT.   Newly diagnosed atrial fibrillation with RVR: CHADs2VAsc 3. Management per cardiology. The patient is on amiodarone gtt now with ongoing adjustment of cardiac medications for rate control. She is receiving Eliquis for CVA prophylaxis. Echocardiogram has demonstrate dilated LA and RA with normal ejection fraction.  AKI: Baseline creatinine ~ 1, likely due to diuresis, hold lasix at this time . Monitor creatinine, electrolytes, and volume status. Avoid nephrotoxic agents and hypotension.   Hypotension in the setting of new cardiac medications: Continue to closely monitor on telemetry. Maintain MAP greater than 65 to avoid complications. Ongoing adjustment of medications by cardiology.  Elevated TSH: Likely euthyroid sick syndrome. Pt should have TSH, T4 and T3 checked in 6 weeks by PCP.  Prediabetes with marked hyperglycemia due to steroids: HbA1C is 6.2. The patient is on Lantus which has been increased to 15 U BID. Monitor glucoses as steroids are tapered down. High risk forhypoglycemia. She is continuing on sliding scale and standing mealtime insulinand a carbohydrate modified diet.   Essential hypertension: BP actually low.   Peripheral neuropathy: Continue Gabapentin 3 times daily as at home.  Hyperlipidemia: Continue Crestor  Bilateral knee  osteoarthritis: Pt complaining of increased pain in left knee due to inactivity. I will have the patient ambulated in the hall every shift.   I have seen and examined this patient myself. I have spent 34 minutes in her evaluation and care.  DVT prophylaxis: ZO:[id number] Code Status: Full code  Family Communication: Patient Disposition Plan: home once off drips and pending continued improvement  [name] Wannamaker, DO [facility] Direct contact: see [email or web address]  7PM-7AM contact night coverage as above 03/04/2019, 3:26 PM  LOS: 10 days

## 2019-03-04 NOTE — Progress Notes (Addendum)
Occupational Therapy Treatment Patient Details Name: [name] [name] MRN: [id number] DOB: [birth year] Today's Date: 03/04/2019    History of present illness Pt is a 69 y.o. female admitted 02/22/19 with worsening SOB and cough; (+) COVID-19. CXR indicated Bilateral airspace disease; favor congestive heart failure and edema over PNA. 02/25/19 developed afib with RVR PMH includes  HTN, chronic knee pain, CKD II.   OT comments  Pt progressing and motivated to work with therapies. Pt completing room level mobility without AD at minguard assist level. Educated in additional seated UB/LB exercises which pt can complete on her own when seated in recliner or sitting up in bed for continued strengthening/endurance as precursor to completing ADL/mobility tasks. Pt return demonstrating therex with min cues and tolerating activity well. HR 71-89 bpm with activity. Will continue per POC at this time.   Follow Up Recommendations  Home health OT;Supervision - Intermittent    Equipment Recommendations  Tub/shower seat          Precautions / Restrictions Precautions Precautions: Fall Restrictions Weight Bearing Restrictions: No       Mobility Bed Mobility               General bed mobility comments: received OOB in recliner  Transfers Overall transfer level: Needs assistance Equipment used: None Transfers: Sit to/from Stand Sit to Stand: Min guard;Supervision         General transfer comment: for safety, lines and balance    Balance Overall balance assessment: Needs assistance Sitting-balance support: Feet supported;No upper extremity supported Sitting balance-Leahy Scale: Good     Standing balance support: During functional activity;Bilateral upper extremity supported;No upper extremity supported Standing balance-Leahy Scale: Fair Standing balance comment: close min guard for safety                           ADL either performed or assessed with clinical  judgement   ADL Overall ADL's : Needs assistance/impaired                                     Functional mobility during ADLs: Min guard General ADL Comments: pt engaging in room level mobility, seated/standing therex                       Cognition Arousal/Alertness: Awake/alert Behavior During Therapy: WFL for tasks assessed/performed Overall Cognitive Status: Difficult to assess                                 General Comments: utilized Marketing executive [phone]        Exercises Exercises: General Lower Extremity;General Upper Extremity;Other exercises General Exercises - Upper Extremity Shoulder Flexion: AROM;Both;20 reps;Seated General Exercises - Lower Extremity Ankle Circles/Pumps: AROM;Both;15 reps;Seated Long Arc Quad: AROM;Both;Seated;20 reps Hip ABduction/ADduction: AROM;Both;15 reps;Seated Hip Flexion/Marching: AROM;Both;15 reps;Seated Other Exercises Other Exercises: sit<>stand x5 from recliner   Shoulder Instructions       General Comments HR 71-89 bpm during activity, BP 106/63 start of session    Pertinent Vitals/ Pain       Pain Assessment: Faces Faces Pain Scale: No hurt Pain Intervention(s): Limited activity within patient's tolerance;Monitored during session  Home Living  Prior Functioning/Environment              Frequency  Min 2X/week        Progress Toward Goals  OT Goals(current goals can now be found in the care plan section)  Progress towards OT goals: Progressing toward goals  Acute Rehab OT Goals Patient Stated Goal: home soon OT Goal Formulation: With patient Time For Goal Achievement: 03/11/19 Potential to Achieve Goals: Good ADL Goals Pt Will Perform Grooming: with modified independence;standing Pt Will Perform Lower Body Bathing: with modified independence;sit to/from stand Pt Will Perform Lower Body Dressing: with  modified independence;sit to/from stand Pt Will Transfer to Toilet: with modified independence;ambulating Pt Will Perform Toileting - Clothing Manipulation and hygiene: with modified independence;sit to/from stand  Plan Discharge plan remains appropriate    Co-evaluation                 AM-PAC OT "6 Clicks" Daily Activity     Outcome Measure   Help from another person eating meals?: None Help from another person taking care of personal grooming?: A Little Help from another person toileting, which includes using toliet, bedpan, or urinal?: A Little Help from another person bathing (including washing, rinsing, drying)?: A Little Help from another person to put on and taking off regular upper body clothing?: None Help from another person to put on and taking off regular lower body clothing?: A Little 6 Click Score: 20    End of Session    OT Visit Diagnosis: Other abnormalities of gait and mobility (R26.89);Muscle weakness (generalized) (M62.81)   Activity Tolerance Patient tolerated treatment well   Patient Left in chair;with call bell/phone within reach   Nurse Communication Mobility status        Time: 9242-6834 OT Time Calculation (min): 41 min  Charges: OT General Charges $OT Visit: 1 Visit OT Treatments $Self Care/Home Management : 8-22 mins $Therapeutic Activity: 23-37 mins  Marcy Siren, OT Supplemental Rehabilitation Services Pager [phone] Office [phone]    [name] [name] 03/04/2019, 5:10 PM

## 2019-03-04 NOTE — Plan of Care (Signed)
  Problem: Education: Goal: Knowledge of General Education information will improve Description: Including pain rating scale, medication(s)/side effects and non-pharmacologic comfort measures Outcome: Progressing   Problem: Clinical Measurements: Goal: Will remain free from infection Outcome: Progressing   Problem: Clinical Measurements: Goal: Respiratory complications will improve Outcome: Progressing   

## 2019-03-05 LAB — D-DIMER, QUANTITATIVE: D-Dimer, Quant: 1.08 ug/mL-FEU — ABNORMAL HIGH (ref 0.00–0.50)

## 2019-03-05 LAB — COMPREHENSIVE METABOLIC PANEL
ALT: 25 U/L (ref 0–44)
AST: 16 U/L (ref 15–41)
Albumin: 2.2 g/dL — ABNORMAL LOW (ref 3.5–5.0)
Alkaline Phosphatase: 58 U/L (ref 38–126)
Anion gap: 8 (ref 5–15)
BUN: 39 mg/dL — ABNORMAL HIGH (ref 8–23)
CO2: 23 mmol/L (ref 22–32)
Calcium: 8 mg/dL — ABNORMAL LOW (ref 8.9–10.3)
Chloride: 107 mmol/L (ref 98–111)
Creatinine, Ser: 1.22 mg/dL — ABNORMAL HIGH (ref 0.44–1.00)
GFR calc Af Amer: 52 mL/min — ABNORMAL LOW (ref >60)
GFR calc non Af Amer: 45 mL/min — ABNORMAL LOW (ref >60)
Glucose, Bld: 145 mg/dL — ABNORMAL HIGH (ref 70–99)
Potassium: 4.9 mmol/L (ref 3.5–5.1)
Sodium: 138 mmol/L (ref 135–145)
Total Bilirubin: 0.6 mg/dL (ref 0.3–1.2)
Total Protein: 5.7 g/dL — ABNORMAL LOW (ref 6.5–8.1)

## 2019-03-05 LAB — GLUCOSE, CAPILLARY
Glucose-Capillary: 149 mg/dL — ABNORMAL HIGH (ref 70–99)
Glucose-Capillary: 98 mg/dL (ref 70–99)

## 2019-03-05 LAB — FERRITIN: Ferritin: 35 ng/mL (ref 11–307)

## 2019-03-05 LAB — C-REACTIVE PROTEIN: CRP: 0.8 mg/dL (ref <1.0)

## 2019-03-05 MED ORDER — ASCORBIC ACID 500 MG PO TABS: 500.0000 mg | ORAL_TABLET | Freq: Two times a day (BID) | ORAL | 0 refills | Status: DC

## 2019-03-05 MED ORDER — PANTOPRAZOLE SODIUM 40 MG PO TBEC: 40.0000 mg | DELAYED_RELEASE_TABLET | Freq: Every day | ORAL | 0 refills | Status: DC

## 2019-03-05 MED ORDER — APIXABAN 5 MG PO TABS: 5.0000 mg | ORAL_TABLET | Freq: Two times a day (BID) | ORAL | 0 refills | Status: DC

## 2019-03-05 MED ORDER — ZINC SULFATE 220 (50 ZN) MG PO CAPS: 220.0000 mg | ORAL_CAPSULE | Freq: Every day | ORAL | 0 refills | Status: DC

## 2019-03-05 MED ORDER — IPRATROPIUM-ALBUTEROL 20-100 MCG/ACT IN AERS: 2.0000 | INHALATION_SPRAY | Freq: Three times a day (TID) | RESPIRATORY_TRACT | 0 refills | Status: DC

## 2019-03-05 MED ORDER — DILTIAZEM HCL ER COATED BEADS 120 MG PO CP24: 120.0000 mg | ORAL_CAPSULE | Freq: Every day | ORAL | 11 refills | Status: DC

## 2019-03-05 MED ORDER — LORATADINE 10 MG PO TABS: 10.0000 mg | ORAL_TABLET | Freq: Every day | ORAL | 0 refills | Status: DC

## 2019-03-05 NOTE — Progress Notes (Signed)
[name] [name] to be D/C'd Home per MD order.  Discussed with the patient and all questions fully answered.  VSS, Skin clean, dry and intact without evidence of skin break down, no evidence of skin tears noted. IV catheter discontinued intact. Site without signs and symptoms of complications. Dressing and pressure applied.  An After Visit Summary was printed and given to the patient. Patient received prescription.  D/c education completed with patient/family including follow up instructions, medication list, d/c activities limitations if indicated, with other d/c instructions as indicated by MD - patient able to verbalize understanding, all questions fully answered.   Patient instructed to return to ED, call 911, or call MD for any changes in condition.   Patient escorted via WC, and D/C home via private auto.  Translator was provided and information was given in the pt language related to medication and Home health.   Conley Canal Montejano 03/05/2019 2:07 PM

## 2019-03-05 NOTE — Care Management (Addendum)
Pt deemed stable for discharge home today.  CM dicussed discharge with Ms [name] [name] - she confirms she will pick pt up at discharge and she confirms she will pay for medications at [facility] pharmacy. CM confirmed this am that [facility] pharmacy can fill Eliquis prescription - however CM is now un able to reach pharmacy to provide free 30 day benefit via phone.  CM printed free 30 day coupon for Eliquis and informed bedside nurse to provide to Ms [name] [name] when she picks pt up.  Frances Furbish informed that pt will discharge home today.  Ms [name] [name] will purchase shower seat.  Ms [name] [name] confirms she wants to be the POC  contact for [facility] and Telehealth visit - both Frances Furbish and [facility] aware.  No other CM needs determined- CM signing off

## 2019-03-05 NOTE — Discharge Summary (Addendum)
Physician Discharge Summary  [name] [name] XTG:[id number] DOB: [birth year] DOA: 02/22/2019  PCP: [name] Blackbird, MD  Admit date: 02/22/2019 Discharge date: 03/05/2019  Recommendations for Outpatient Follow-up:  Discharge to home with home health PT/OT Tub/shower seat ordered Follow up with PCP in 7-10 days.  Follow-up Information     [name] [name] Follow up.   Why: Home Health          Discharge Diagnoses: Principal diagnosis is #1 Acute hypoxic respiratory failure Atrial fibrillation with RVR AKI Hypotension Euthyroid sick syndrome Hyperglycemia due to steroids Essential hypertension Peripheral neuropathy Hyperlipidemia Osteoarthritis of bilateral knees Pulmonary edema COVID pneumonia  Discharge Condition: Fair  Disposition: Home with home health PT/OT  Diet recommendation: Heart Healthy  Filed Weights   02/28/19 0500 03/01/19 0457 03/05/19 0500  Weight: 91.6 kg 91 kg 91.8 kg    History of present illness:  The patient is a 69 yr old woman who presented to [name] on 02/22/2019 with worsening SOB and cough x 7 days. . She was found to be COVID-19 positive and hypoxic due to bilateral airspace disease on CXR suggestive of pulmonary edema over PNA. She was admitted to a telemetry bed and treated with remdesivir, steroids, and supplementation with Vitamin C, Zinc, and Vitamin D3. Procalcitonin was negative.   On 02/25/2019 the patient was found to be in atrial fibrillation with RVR. Cardiology has been consulted. The patient is currently receiving oral diltiazem and IV amiodarone. Heart rate is under fair control, if a little low, at rest, and oxygen requirements are at baseline. Cardiology plans for a short course of amiodarone as they feel that cause for atrial fibrillation will decrease as her COVID-19 symptoms resolve.  [name] Course:   [name] [name] is a 69 y.o. adult with medical history significant of hypertension, chronic knee pain,, chronic  kidney disease stage II(per PCPs note), presenting today ED with a 7-day history of worsening symptoms of productive cough of yellowish sputum, chest discomfort associated with coughing, myalgias, headache, intermittent chills, generalized weakness, decreased appetite, decreased oral intake, worsening shortness of breath.  Patient denies any documented fevers.  Patient denies any nausea, no vomiting, no abdominal pain, no melena, no hematemesis, no hematochezia, no syncopal episodes, no focal neurological deficits.  Patient underwent a virtual visit via telephone note with PCP on the day of admission and due to patient symptoms was subsequently sent to the ED.  Patient did state was recently tested for Covid however has not received results yet. Patient recently emigrated from [place] of [place] nearly 2 years ago and primarily speaks [place] and also some [place].  Per ED PA close friend interpreted for ED PA however at my visit patient's friend had left and as such history obtained via interpreter via iPad. Per ED PA patient noted to have sats of 88% on room air and as such patient subsequently placed on 2 L nasal cannula O2 and due to concerns for hypoxia hospitalists were called to admit the patient.   ED Course: Patient seen in the ED metabolic profile obtained unremarkable.  High-sensitivity troponin was 18.  Obtained unremarkable.  BNP pending.  SARS COVID-19 pending.  Chest x-ray done with bilateral airspace disease favoring CHF and edema over pneumonia.  BNP pending.  Hospitalist were called to admit the patient for further evaluation and management.  Today's assessment: S: The patient is resting comfortably. No new complaints O: Vitals:  Vitals:   03/05/19 0633 03/05/19 1209  BP: 118/81 124/88  Pulse:  Resp:    Temp:    SpO2:     Constitutional:  The patient is awake, alert, and oriented x 3. No acute distress. Respiratory:  No increased work of breathing. No wheezes,  rales, or rhonchi No tactile fremitus Cardiovascular:  Irregular rate and rhythm, slow rate No murmurs, ectopy, or gallups. No lateral PMI. No thrills. Abdomen:  Abdomen is soft, non-tender, non-distended No hernias, masses, or organomegaly Normoactive bowel sounds.  Musculoskeletal:  No cyanosis, clubbing, or edema Skin:  No rashes, lesions, ulcers palpation of skin: no induration or nodules Neurologic:  CN 2-12 intact Sensation all 4 extremities intact Psychiatric:  Mental status Mood, affect appropriate Orientation to person, place, time  judgment and insight appear intact  Discharge Instructions  Discharge Instructions     Activity as tolerated - No restrictions   Complete by: As directed    Call MD for:  difficulty breathing, headache or visual disturbances   Complete by: As directed    Call MD for:  persistant dizziness or light-headedness   Complete by: As directed    Call MD for:  temperature >100.4   Complete by: As directed    Diet - low sodium heart healthy   Complete by: As directed    Discharge instructions   Complete by: As directed    Discharge to home with home health PT/OT Tub/shower seat ordered Follow up with PCP in 7-10 days.   Increase activity slowly   Complete by: As directed       Allergies as of 03/05/2019   No Known Allergies      Medication List     STOP taking these medications    docusate sodium 100 MG capsule Commonly known as: COLACE   mupirocin ointment 2 % Commonly known as: BACTROBAN       TAKE these medications    acetaminophen-codeine 300-30 MG tablet Commonly known as: TYLENOL #3 Take 1 tablet by mouth every 6 (six) hours as needed for moderate pain. What changed: reasons to take this   amLODipine 5 MG tablet Commonly known as: NORVASC Take 1 tablet (5 mg total) by mouth daily. To lower blood pressure   apixaban 5 MG Tabs tablet Commonly known as: ELIQUIS Take 1 tablet (5 mg total) by mouth 2 (two)  times daily.   ascorbic acid 500 MG tablet Commonly known as: VITAMIN C Take 1 tablet (500 mg total) by mouth 2 (two) times daily.   diclofenac sodium 1 % Gel Commonly known as: VOLTAREN Apply 4 g topically 4 (four) times daily. Apply to feet 4 times daily PRN for pain. What changed:  when to take this reasons to take this additional instructions   diltiazem 120 MG 24 hr capsule Commonly known as: Cardizem CD Take 1 capsule (120 mg total) by mouth daily.   gabapentin 100 MG capsule Commonly known as: Neurontin Take 1 capsule (100 mg total) by mouth 3 (three) times daily.   Ipratropium-Albuterol 20-100 MCG/ACT Aers respimat Commonly known as: COMBIVENT Inhale 2 puffs into the lungs 3 (three) times daily.   loratadine 10 MG tablet Commonly known as: CLARITIN Take 1 tablet (10 mg total) by mouth daily.   pantoprazole 40 MG tablet Commonly known as: PROTONIX Take 1 tablet (40 mg total) by mouth daily at 6 (six) AM. Start taking on: March 06, 2019   rosuvastatin 10 MG tablet Commonly known as: CRESTOR Take 1 tablet (10 mg total) by mouth daily. To lower cholesterol   triamcinolone cream 0.1 %  Commonly known as: KENALOG Apply to dry skin 1-2 times daily sparingly What changed:  how much to take how to take this when to take this additional instructions   zinc sulfate 220 (50 Zn) MG capsule Take 1 capsule (220 mg total) by mouth daily.               Durable Medical Equipment  (From admission, onward)           Start     Ordered   03/05/19 0921  DME Tub Bench  Once     03/05/19 [id number]   02/28/19 1442  For home use only DME Tub bench  Once     02/28/19 1441           No Known Allergies  The results of significant diagnostics from this hospitalization (including imaging, microbiology, ancillary and laboratory) are listed below for reference.    Significant Diagnostic Studies: DG Chest 2 View  Result Date: 02/22/2019 CLINICAL DATA:  Chest pain  EXAM: CHEST - 2 VIEW COMPARISON:  07/17/2018 FINDINGS: Cardiac enlargement with vascular congestion. Bibasilar atelectasis/infiltrate. Probable small pleural effusions. IMPRESSION: Bilateral airspace disease. Favor congestive heart failure and edema over pneumonia. Electronically Signed   By: Marlan Palauharles  Clark M.D.   On: 02/22/2019 14:54   ECHOCARDIOGRAM LIMITED  Result Date: 02/28/2019   ECHOCARDIOGRAM LIMITED REPORT   Patient Name:   Jetty DuhamelVERONQUE KABIKA KALALA Date of Exam: 02/28/2019 Medical Rec #:  [id number]              Height:       64.0 in Accession #:    [id number]             Weight:       201.9 lb Date of Birth:  1950/12/27             BSA:          1.96 m Patient Age:    68 years               BP:           109/88 mmHg Patient Gender: F                      HR:           67 bpm. Exam Location:  Inpatient  Procedure: 2D Echo Indications:    Atrial fibrillation  History:        Patient has no prior history of Echocardiogram examinations.  Sonographer:    Ross LudwigArthur Guy RDCS (AE) Referring Phys: [id number] Darlin DropAROLE N HALL  Sonographer Comments: COVID-19. IMPRESSIONS  1. Left ventricular ejection fraction, by visual estimation, is 60 to 65%. Left ventricular septal wall thickness was moderately increased. Moderately increased left ventricular posterior wall thickness. There is moderately increased left ventricular wall thickness.  2. Left atrial size was severely dilated.  3. Right atrial size was mildly dilated.  4. Mild mitral valve regurgitation.  5. Tricuspid valve regurgitation is mild.  6. Tricuspid valve regurgitation is mild.  7. Mildly elevated pulmonary artery systolic pressure.  8. The inferior vena cava is dilated in size with >50% respiratory variability, suggesting right atrial pressure of 8 mmHg. FINDINGS  Left Ventricle: Left ventricular ejection fraction, by visual estimation, is 60 to 65%. Left ventricular septal wall thickness was moderately increased. Moderately increased left ventricular posterior  wall thickness. There is moderately increased left ventricular wall thickness. Right Ventricle: The tricuspid regurgitant velocity is 2.41 m/s, and  with an assumed right atrial pressure of 8 mmHg, the estimated right ventricular systolic pressure is mildly elevated at 31.2 mmHg. Left Atrium: Left atrial size was severely dilated. Right Atrium: Right atrial size was mildly dilated. Right atrial pressure is estimated at 8 mmHg. Mitral Valve: Mild mitral valve regurgitation. Tricuspid Valve: Tricuspid valve regurgitation is mild. Aortic Valve: The aortic valve is tricuspid. Aortic valve mean gradient measures 3.4 mmHg. Aortic valve peak gradient measures 6.5 mmHg. Aortic valve area, by VTI measures 1.67 cm. Venous: The inferior vena cava is dilated in size with greater than 50% respiratory variability, suggesting right atrial pressure of 8 mmHg.  LEFT VENTRICLE          Normals PLAX 2D LVIDd:         3.90 cm  3.6 cm LVIDs:         2.46 cm  1.7 cm LV PW:         1.57 cm  1.4 cm LV IVS:        1.66 cm  1.3 cm LVOT diam:     1.80 cm  2.0 cm LV SV:         44 ml    79 ml LV SV Index:   21.45    45 ml/m2 LVOT Area:     2.54 cm 3.14 cm2  IVC IVC diam: 2.96 cm LEFT ATRIUM              Index       RIGHT ATRIUM           Index LA diam:        3.20 cm  1.63 cm/m  RA Area:     20.20 cm LA Vol (A2C):   95.1 ml  48.41 ml/m RA Volume:   58.00 ml  29.53 ml/m LA Vol (A4C):   123.0 ml 62.61 ml/m LA Biplane Vol: 110.0 ml 56.00 ml/m  AORTIC VALVE                   Normals AV Area (Vmax):    1.65 cm AV Area (Vmean):   1.65 cm    3.06 cm2 AV Area (VTI):     1.67 cm AV Vmax:           127.00 cm/s AV Vmean:          82.960 cm/s 77 cm/s AV VTI:            0.203 m     3.15 cm2 AV Peak Grad:      6.5 mmHg AV Mean Grad:      3.4 mmHg    3 mmHg LVOT Vmax:         82.38 cm/s LVOT Vmean:        53.800 cm/s 75 cm/s LVOT VTI:          0.133 m     25.3 cm LVOT/AV VTI ratio: 0.66        1  AORTA                 Normals Ao Root diam: 3.00 cm  31 mm TRICUSPID VALVE             Normals TR Peak grad:   23.2 mmHg TR Vmax:        309.00 cm/s 288 cm/s  SHUNTS Systemic VTI:  0.13 m Systemic Diam: 1.80 cm  Chilton Si MD Electronically signed by Chilton Si MD Signature Date/Time: 02/28/2019/6:59:37 PM  Final     Microbiology: No results found for this or any previous visit (from the past 240 hour(s)).   Labs: Basic Metabolic Panel: Recent Labs  Lab 02/27/19 0240 02/27/19 0240 02/28/19 0324 02/28/19 0324 03/01/19 0742 03/02/19 0925 03/03/19 1315 03/04/19 0247 03/05/19 0203  NA 137   < > 135   < > 139 134* 136 135 138  K 4.6   < > 4.9   < > 5.1 5.2* 5.0 5.1 4.9  CL 106   < > 106   < > 108 106 106 106 107  CO2 22   < > 21*   < > 24 18* 22 21* 23  GLUCOSE 295*   < > 419*   < > 271* 448* 169* 172* 145*  BUN 36*   < > 37*   < > 37* 46* 39* 39* 39*  CREATININE 1.34*   < > 1.51*   < > 1.39* 1.46* 1.34* 1.49* 1.22*  CALCIUM 8.4*   < > 8.5*   < > 8.4* 8.0* 8.2* 8.2* 8.0*  MG 2.1  --  2.2  --   --   --   --   --   --    < > = values in this interval not displayed.   Liver Function Tests: Recent Labs  Lab 03/01/19 0742 03/02/19 0925 03/03/19 1315 03/04/19 0247 03/05/19 0203  AST 11* 14* 17 18 16   ALT 16 18 20 23 25   ALKPHOS 65 74 60 61 58  BILITOT 0.4 0.3 0.6 0.9 0.6  PROT 5.8* 5.8* 5.9* 5.7* 5.7*  ALBUMIN 2.2* 2.2* 2.3* 2.3* 2.2*   No results for input(s): LIPASE, AMYLASE in the last 168 hours. No results for input(s): AMMONIA in the last 168 hours. CBC: Recent Labs  Lab 02/27/19 0240 02/28/19 0324 03/01/19 0742 03/02/19 0925  WBC 11.5* 9.3 10.5 12.0*  NEUTROABS  --   --  9.0*  --   HGB 11.6* 12.2 11.9* 12.3  HCT 34.9* 36.7 34.9* 36.0  MCV 78.6* 79.3* 77.7* 78.3*  PLT 271 271 248 271   Cardiac Enzymes: No results for input(s): CKTOTAL, CKMB, CKMBINDEX, TROPONINI in the last 168 hours. BNP: BNP (last 3 results) Recent Labs    02/22/19 1620  BNP 190.4*    ProBNP (last 3 results) No results  for input(s): PROBNP in the last 8760 hours.  CBG: Recent Labs  Lab 03/04/19 1222 03/04/19 1526 03/04/19 2233 03/05/19 0803 03/05/19 1149  GLUCAP 165* 176* 156* 149* 98    Principal Problem:   Acute respiratory failure with hypoxia (HCC) Active Problems:   Hypertension   Knee osteoarthritis   Essential hypertension, benign   COVID-19   Atrial fibrillation with RVR (HCC)   Acute kidney injury (HCC)   Time coordinating discharge: 38 minutes.  Signed:        Jillien Yakel, DO [facility]  03/05/2019, 5:09 PM

## 2019-03-06 ENCOUNTER — Telehealth: Payer: Self-pay

## 2019-03-06 NOTE — Telephone Encounter (Signed)
Transition Care Management Follow-up Telephone Call  The patient does not have a phone.  The information was provided by Shann Medal, RN/Congregational Nurse Program.  The phone number listed for the paitent  is for [name]/Community health worker    Date of discharge and from where: 03/05/2019, [facility]   Any questions or concerns? [place] explained that the patient may be loosing her housing and she will be contacting the [employer] for resources.   The patient is uninsured and is not eligible for medicare/ medicaid /disability due to her asylum status.   [name]/Community Health Worker with [facility] has been her primary support in the community.  Items Reviewed:  Did the pt receive and understand the discharge instructions provided?[place] to review and communicate with Ms. Smith as needed. .  Medications obtained and verified? [place] to check with Ms Katrinka Blazing.    Any new allergies since your discharge?   None reported  Do you have support at home? Only consistent support is Ms Katrinka Blazing   Other (ie: DME, Home Health, etc) referral was placed to [facility] for home PT/OT  Shower seat also ordered. Ms Katrinka Blazing to pick up  Functional Questionnaire: (I = Independent and D = Dependent) ADL's: independent , has RW for ambulation.  Activity limited by knee pain.    Follow up appointments reviewed:    PCP Hospital f/u appt confirmed? At request of Dr Jillyn Hidden, appointment made at respiratory clinic  - 03/13/2019 @ 1745.   Congregational nurse to schedule transportation   [facility] f/u appt confirmed? None scheduled at this time  Are transportation arrangements needed? Congregational nurse to arrange cab transport  If their condition worsens, is the pt aware to call  their PCP or go to the ED? Yes, or she will call her community health worker  Was the patient provided with contact information for the PCP's office or ED?[place] to provide necessary  information to patient who has no phone.   Was the pt encouraged to call back with questions or concerns? [place] or Ms Katrinka Blazing will contact this office with questions/concerns/

## 2019-03-07 NOTE — Telephone Encounter (Signed)
Call received from [place] RN /CNP noting that the patient has all of her medications including the eliquis.  She plans to follow up with the patient twice a week and is also in regular contact with the patient's community health worker.  [name] [name] was in agreement to cancelling the appointment with Dr Jillyn Hidden 03/08/2019 as the patient has an appointment at the respiratory clinic on 03/13/2019

## 2019-03-08 ENCOUNTER — Ambulatory Visit: Payer: Self-pay | Admitting: Family Medicine

## 2019-03-13 ENCOUNTER — Ambulatory Visit (INDEPENDENT_AMBULATORY_CARE_PROVIDER_SITE_OTHER): Payer: Self-pay | Admitting: Family Medicine

## 2019-03-13 ENCOUNTER — Telehealth: Payer: Self-pay

## 2019-03-13 VITALS — BP 168/92 | HR 86 | Temp 100.2°F | Resp 14 | Ht 64.0 in | Wt 196.0 lb

## 2019-03-13 DIAGNOSIS — Z8616 Personal history of COVID-19: Secondary | ICD-10-CM

## 2019-03-13 DIAGNOSIS — Z758 Other problems related to medical facilities and other health care: Secondary | ICD-10-CM

## 2019-03-13 DIAGNOSIS — R7989 Other specified abnormal findings of blood chemistry: Secondary | ICD-10-CM

## 2019-03-13 DIAGNOSIS — U071 COVID-19: Secondary | ICD-10-CM

## 2019-03-13 DIAGNOSIS — I4891 Unspecified atrial fibrillation: Secondary | ICD-10-CM

## 2019-03-13 DIAGNOSIS — I10 Essential (primary) hypertension: Secondary | ICD-10-CM

## 2019-03-13 DIAGNOSIS — R0600 Dyspnea, unspecified: Secondary | ICD-10-CM

## 2019-03-13 DIAGNOSIS — Z789 Other specified health status: Secondary | ICD-10-CM

## 2019-03-13 MED ORDER — FUROSEMIDE 20 MG PO TABS: 20.0000 mg | ORAL_TABLET | Freq: Every day | ORAL | 0 refills | Status: DC

## 2019-03-13 NOTE — Telephone Encounter (Signed)
Call received from [facility], RN/CNP she explained that she plans to meet the patient at the respiratory clinic this afternoon to insure that she arrives at the correct plan.  She is also arranging transportation for the patient   [name] [name] will also be meeting with [name]/Community Health worker to discuss options for more permanent housing for the patient

## 2019-03-13 NOTE — Progress Notes (Signed)
Patient ID: [name] [name], adult    DOB: [birth year], 69 y.o.   MRN: [id number]  PCP: Antony Blackbird, MD  Chief Complaint  Patient presents with  . COVID 19    HIGH RISK   . Fever    Subjective:  HPI  [name] [name] is a 69 y.o. adult presents to [facility] Respiratory clinic for hospital follow-up following an impatient admission related to Hypoxia related to COVID-19.  Interpreter friend used to assist with communication during visit.  Medical history significant for Atrial Fibrillation, Uncontrolled Hypertension, PAD, Hypercholesterolemia    Patient seen today for face to face evaluation following an admission to impatient services due to respiratory failure associated with COVID-19.  Patient has completed a telephonic visit with primary care provider to address follow-up items recommended following discharge from recent admission.  Patient was hospitalized from 02/21/2019 for acute respiratory distress.  Chest x-ray findings were concerning for CHF and pulmonary edema over pneumonia.  Patient has a history of cardiovascular disease. Blood pressure is elevated today. Patient doesn't check BP at home, however is followed by conversational nurse who monitors patient's blood pressure at least once weekly.  She speaks [place] and is native of [place].  Patient's friend/ Education officer, museum has assisted patient with obtaining medications and she has all medications with her today. She endorses taking all medication as prescribed. She is homeless and residing in temporary housing. She denies any issues with shortness of breath, coughing, chest pain or weakness. She was unclear as to the reason she was recommended to follow-up here at the [place] clinic. She is afebrile today. Her main concern is leg swelling. In review of EMR this is a chronic problem and she has been seen by Vascular specialist recently for this problem and no vascular intervention was recommended.    Review of Systems Pertinent  negatives listed in HPI  Patient Active Problem List   Diagnosis Date Noted  . Acute kidney injury ([place]) 03/02/2019  . Atrial fibrillation with RVR ([place]) 02/26/2019  . Acute respiratory failure with hypoxia ([place]) 02/22/2019  . COVID-19 02/22/2019  . Essential hypertension, benign 05/03/2018  . Hypertension 10/24/2017  . Knee osteoarthritis 10/24/2017      Prior to Admission medications   Medication Sig Start Date End Date Taking? Authorizing Provider  acetaminophen-codeine (TYLENOL #3) 300-30 MG tablet Take 1 tablet by mouth every 6 (six) hours as needed for moderate pain. Patient taking differently: Take 1 tablet by mouth every 6 (six) hours as needed (arthritis pain).  08/08/18  Yes Fulp, Cammie, MD  amLODipine (NORVASC) 5 MG tablet Take 1 tablet (5 mg total) by mouth daily. To lower blood pressure 09/26/18  Yes Fulp, Cammie, MD  apixaban (ELIQUIS) 5 MG TABS tablet Take 1 tablet (5 mg total) by mouth 2 (two) times daily. 03/05/19  Yes Swayze, Ava, DO  ascorbic acid (VITAMIN C) 500 MG tablet Take 1 tablet (500 mg total) by mouth 2 (two) times daily. 03/05/19  Yes Swayze, Ava, DO  diclofenac sodium (VOLTAREN) 1 % GEL Apply 4 g topically 4 (four) times daily. Apply to feet 4 times daily PRN for pain. Patient taking differently: Apply 4 g topically 4 (four) times daily as needed (knee pain).  09/26/18  Yes Fulp, Cammie, MD  diltiazem (CARDIZEM CD) 120 MG 24 hr capsule Take 1 capsule (120 mg total) by mouth daily. 03/05/19 03/04/20 Yes Swayze, Ava, DO  gabapentin (NEURONTIN) 100 MG capsule Take 1 capsule (100 mg total) by mouth 3 (three) times  daily. 08/08/18  Yes Fulp, Cammie, MD  Ipratropium-Albuterol (COMBIVENT) 20-100 MCG/ACT AERS respimat Inhale 2 puffs into the lungs 3 (three) times daily. 03/05/19  Yes Swayze, Ava, DO  loratadine (CLARITIN) 10 MG tablet Take 1 tablet (10 mg total) by mouth daily. 03/05/19  Yes Swayze, Ava, DO  pantoprazole (PROTONIX) 40 MG tablet Take 1 tablet (40 mg total) by mouth  daily at 6 (six) AM. 03/06/19  Yes Swayze, Ava, DO  rosuvastatin (CRESTOR) 10 MG tablet Take 1 tablet (10 mg total) by mouth daily. To lower cholesterol 08/09/18  Yes Fulp, Cammie, MD  triamcinolone cream (KENALOG) 0.1 % Apply to dry skin 1-2 times daily sparingly Patient taking differently: Apply 1 application topically See admin instructions. Apply sparingly to dark spots on face as needed for itching. 09/26/18  Yes Fulp, Cammie, MD  zinc sulfate 220 (50 Zn) MG capsule Take 1 capsule (220 mg total) by mouth daily. 03/05/19  Yes Swayze, Ava, DO    Past Medical, Surgical Family and Social History reviewed and updated.    Objective:   Today's Vitals   03/13/19 1743  BP: (!) 168/92  Pulse: 86  Resp: 14  Temp: 100.2 F (37.9 C)  TempSrc: Oral  Weight: 196 lb (88.9 kg)  Height: 5\' 4"  (1.626 m)    Wt Readings from Last 3 Encounters:  03/13/19 196 lb (88.9 kg)  03/05/19 202 lb 6.1 oz (91.8 kg)  09/26/18 194 lb 12.8 oz (88.4 kg)    Physical Exam Constitutional:      Appearance: She is ill-appearing.     Comments: Chronically ill-appearing   HENT:     Head: Normocephalic.     Nose: Congestion present.  Eyes:     Comments: Light yellowing of  bilateral eyes   Cardiovascular:     Rate and Rhythm: Normal rate and regular rhythm.  Pulmonary:     Breath sounds: Decreased air movement present. Decreased breath sounds present. No rhonchi.  Musculoskeletal:     Right lower leg: 3+ Pitting Edema present.     Left lower leg: 2+ Edema present.  Lymphadenopathy:     Cervical: No cervical adenopathy.  Skin:    General: Skin is warm.  Neurological:     General: No focal deficit present.     Comments: Antalgic gait   Psychiatric:        Mood and Affect: Affect is flat.     Assessment & Plan:  1. Essential hypertension, uncontrolled, no recent BP logs available. Given patient has BLE edema present today, will trial 7 days of Lasix 20 mg once daily to improve BP control and edema  management. Recent K+ level normal.  Unable to collect labs due to patient dehydrated and lab unable to collect an adequate amount of blood for a specimen Evaluate BP next week, 1 week follow-up here at [facility] clinic. - Comprehensive metabolic panel  2. Language barrier -Use of language interpreter service in future oppose to friend for appropriate conveyance of medical treatment and plan   3. COVID-19, mildly febrile today  - CBC with Differential, unable to collect today  - DG Chest 2 View; Future, pending to rule out persistently active PNA  4. Atrial fibrillation, unspecified type [facility]) - Ambulatory referral to Cardiology, needs management at [facility]   5. Elevated LFTs, mildly jaundice today  - Comprehensive metabolic panel  6. Dyspnea, unspecified type, reports resolution  -Continue Combivent as prescribed  - DG Chest 2 View; Future - Ambulatory referral to  Cardiology   Unfortunately, our staff was unable to draw labs for patient due to poor hydration status. This visit was further complicated as the patient had a significant complications following directions related to language barrier and refused to use out language service and desired to utilize her friend who is her Child psychotherapist for interpreter service. Social worker/friend required instruction multiple times to interpret and not speak for patient. This visit required >40 minutes to complete due to obstacles.   Patient advised to return in 1 week for follow-up, Blood pressure, will attempt to obtain labs if unable to have labs drawn at [facility], and CXR.  -The patient was given clear instructions to go to ER or return to medical center if symptoms do not improve, worsen or new problems develop. The patient verbalized understanding.     Joaquin Courts, FNP-C [facility], PRN Provider  [facility]. [place], [place]  Clinic Phone: [phone] Clinic Fax: [phone] Clinic Hours: 5:30 pm -7:30 pm  (Monday-Friday)

## 2019-03-13 NOTE — Patient Instructions (Addendum)
Take Lasix 20 mg daily in the morning. If leg swelling worsens, go immediately to emergency department.   8:00 am -4:30 pm For x-ray imaging: Go [facility]  [address] [place], [place], [place] [address] Enter through Main Entrance and request x-ray department. You will be contacted either via phone or via Mychart with your x-ray result.     Peripheral Edema  Peripheral edema is swelling that is caused by a buildup of fluid. Peripheral edema most often affects the lower legs, ankles, and feet. It can also develop in the arms, hands, and face. The area of the body that has peripheral edema will look swollen. It may also feel heavy or warm. Your clothes may start to feel tight. Pressing on the area may make a temporary dent in your skin. You may not be able to move your swollen arm or leg as much as usual. There are many causes of peripheral edema. It can happen because of a complication of other conditions such as congestive heart failure, kidney disease, or a problem with your blood circulation. It also can be a side effect of certain medicines or because of an infection. It often happens to women during pregnancy. Sometimes, the cause is not known. Follow these instructions at home: Managing pain, stiffness, and swelling   Raise (elevate) your legs while you are sitting or lying down.  Move around often to prevent stiffness and to lessen swelling.  Do not sit or stand for long periods of time.  Wear support stockings as told by your health care provider. Medicines  Take over-the-counter and prescription medicines only as told by your health care provider.  Your health care provider may prescribe medicine to help your body get rid of excess water (diuretic). General instructions  Pay attention to any changes in your symptoms.  Follow instructions from your health care provider about limiting salt (sodium) in your diet. Sometimes, eating less salt may reduce  swelling.  Moisturize skin daily to help prevent skin from cracking and draining.  Keep all follow-up visits as told by your health care provider. This is important. Contact a health care provider if you have:  A fever.  Edema that starts suddenly or is getting worse, especially if you are pregnant or have a medical condition.  Swelling in only one leg.  Increased swelling, redness, or pain in one or both of your legs.  Drainage or sores at the area where you have edema. Get help right away if you:  Develop shortness of breath, especially when you are lying down.  Have pain in your chest or abdomen.  Feel weak.  Feel faint. Summary  Peripheral edema is swelling that is caused by a buildup of fluid. Peripheral edema most often affects the lower legs, ankles, and feet.  Move around often to prevent stiffness and to lessen swelling. Do not sit or stand for long periods of time.  Pay attention to any changes in your symptoms.  Contact a health care provider if you have edema that starts suddenly or is getting worse, especially if you are pregnant or have a medical condition.  Get help right away if you develop shortness of breath, especially when lying down. This information is not intended to replace advice given to you by your health care provider. Make sure you discuss any questions you have with your health care provider. Document Revised: 10/11/2017 Document Reviewed: 10/11/2017 Elsevier Patient Education  2020 [employer].

## 2019-03-15 ENCOUNTER — Encounter: Payer: Self-pay | Admitting: Pediatric Intensive Care

## 2019-03-18 NOTE — Congregational Nurse Program (Signed)
  Dept: [phone]   Congregational Nurse Program Note  Date of Encounter: 03/15/2019  Past Medical History: Past Medical History:  Diagnosis Date  . Hypertension     Encounter Details:Follow up a home for client. BBS- CTA, no cough noted. No WOB noted. HR- noted irregular beats. No complaints or dizziness noted. Meds organized into pill box and reviewed with client and caregiver Joelle. Client has no amlodipne or gabapentin. Client will have follow up at Respiratory care clinic on WEdnesday. CN will meet client at [facility] on Wednesday AM for CXR. Shann Medal RN BSN CNP [phone]

## 2019-03-19 ENCOUNTER — Other Ambulatory Visit: Payer: Self-pay | Admitting: Family Medicine

## 2019-03-19 DIAGNOSIS — M545 Low back pain, unspecified: Secondary | ICD-10-CM

## 2019-03-19 DIAGNOSIS — G8929 Other chronic pain: Secondary | ICD-10-CM

## 2019-03-19 DIAGNOSIS — I10 Essential (primary) hypertension: Secondary | ICD-10-CM

## 2019-03-19 DIAGNOSIS — M170 Bilateral primary osteoarthritis of knee: Secondary | ICD-10-CM

## 2019-03-19 DIAGNOSIS — B948 Sequelae of other specified infectious and parasitic diseases: Secondary | ICD-10-CM

## 2019-03-19 DIAGNOSIS — R5381 Other malaise: Secondary | ICD-10-CM

## 2019-03-19 MED ORDER — AMLODIPINE BESYLATE 2.5 MG PO TABS: 5.0000 mg | ORAL_TABLET | Freq: Every day | ORAL | 3 refills | Status: DC

## 2019-03-19 NOTE — Progress Notes (Signed)
Patient ID: [name] [name], adult   DOB: Feb 16, 1950, 69 y.o.   MRN: [id number]   Message received that patient needs PT referral placed as well as new RX for amlodipine 2.5 mg to take two per day

## 2019-03-20 ENCOUNTER — Other Ambulatory Visit: Payer: Self-pay | Admitting: Pharmacist

## 2019-03-20 ENCOUNTER — Ambulatory Visit (INDEPENDENT_AMBULATORY_CARE_PROVIDER_SITE_OTHER): Payer: Self-pay | Admitting: Family Medicine

## 2019-03-20 ENCOUNTER — Ambulatory Visit (HOSPITAL_COMMUNITY)
Admission: RE | Admit: 2019-03-20 | Discharge: 2019-03-20 | Disposition: A | Discharge status: Home / Self Care | Payer: HRSA Program | Source: Ambulatory Visit | Attending: Family Medicine | Admitting: Family Medicine

## 2019-03-20 ENCOUNTER — Telehealth: Payer: Self-pay | Admitting: Pediatric Intensive Care

## 2019-03-20 ENCOUNTER — Other Ambulatory Visit: Payer: Self-pay

## 2019-03-20 VITALS — BP 144/92 | HR 92 | Wt 196.0 lb

## 2019-03-20 DIAGNOSIS — R0600 Dyspnea, unspecified: Secondary | ICD-10-CM | POA: Insufficient documentation

## 2019-03-20 DIAGNOSIS — I10 Essential (primary) hypertension: Secondary | ICD-10-CM

## 2019-03-20 DIAGNOSIS — J189 Pneumonia, unspecified organism: Secondary | ICD-10-CM

## 2019-03-20 DIAGNOSIS — U071 COVID-19: Secondary | ICD-10-CM | POA: Diagnosis present

## 2019-03-20 DIAGNOSIS — R600 Localized edema: Secondary | ICD-10-CM

## 2019-03-20 DIAGNOSIS — I4891 Unspecified atrial fibrillation: Secondary | ICD-10-CM

## 2019-03-20 DIAGNOSIS — R7309 Other abnormal glucose: Secondary | ICD-10-CM

## 2019-03-20 MED ORDER — FUROSEMIDE 20 MG PO TABS: 20.0000 mg | ORAL_TABLET | Freq: Every day | ORAL | 0 refills | Status: DC | PRN

## 2019-03-20 MED ORDER — CEFDINIR 300 MG PO CAPS: 300.0000 mg | ORAL_CAPSULE | Freq: Two times a day (BID) | ORAL | 0 refills | Status: AC

## 2019-03-20 MED ORDER — APIXABAN 5 MG PO TABS: 5.0000 mg | ORAL_TABLET | Freq: Two times a day (BID) | ORAL | 2 refills | Status: DC

## 2019-03-20 NOTE — Telephone Encounter (Signed)
Return call from [facility]. CN advised that client is imminent fro losing housing in 2 weeks. Please advise of temporray housing situation. CN gave client name for further documentation. [name] Medal RN BSN CNP [phone]

## 2019-03-20 NOTE — Patient Instructions (Addendum)
Start Omnicef 300 mg twice daily with food for 10 days.  Schedule a follow-up with Dr. Jillyn Hidden PCP around 04/17/19 for hospital follow-up and follow-up chest x-ray.  Furosemide 20 mg is only taking daily for leg swelling.   Our staff will notify you of any abnormal labs and any additional follow-up needed.

## 2019-03-20 NOTE — Progress Notes (Signed)
Patient ID: [name] [name], adult    DOB: 03-06-1950, 69 y.o.   MRN: [id number]  PCP: Antony Blackbird, MD  Chief Complaint  Patient presents with  . Follow-up    Subjective:  HPI [name] [name] is a 69 y.o. adult presents to [facility] Res[facility] clinic for labs s/p discharge from impatient admission related to COVID-19, and right leg edema follow-up.  [place] [employer] # [phone]   Patient seen 1 week ago today for post discharge follow-up. Due to dehydration last week patient, we were unable to collect blood specimen to run labs.  She did obtain a follow-up chest x-ray that revealed persistent pneumonia. She continues to endorses shortness of breath and fatigue with ambulation only. She feels well at rest. She is using inhaler as prescribed. During her visit last week on exam she was noted to have significant right leg swelling. She is anticoagulated with Eliquis. She was treated with Lasix 20 mg daily for management of swelling and endorses significant improvement. She is free of active chest pain. Continues to experience constipation however, has stool softeners now. She doesn't drink water in sufficient quantities, although is trying to increase intake.   Review of Systems Pertinent negatives listed in HPI Patient Active Problem List   Diagnosis Date Noted  . Acute kidney injury ([place]) 03/02/2019  . Atrial fibrillation with RVR ([place]) 02/26/2019  . Acute respiratory failure with hypoxia ([place]) 02/22/2019  . COVID-19 02/22/2019  . Essential hypertension, benign 05/03/2018  . Hypertension 10/24/2017  . Knee osteoarthritis 10/24/2017      Prior to Admission medications   Medication Sig Start Date End Date Taking? Authorizing Provider  acetaminophen-codeine (TYLENOL #3) 300-30 MG tablet Take 1 tablet by mouth every 6 (six) hours as needed for moderate pain. Patient taking differently: Take 1 tablet by mouth every 6 (six) hours as needed (arthritis pain).  08/08/18  Yes Fulp,  Cammie, MD  amLODipine (NORVASC) 2.5 MG tablet Take 2 tablets (5 mg total) by mouth daily. To lower blood pressure 03/19/19  Yes Fulp, Cammie, MD  apixaban (ELIQUIS) 5 MG TABS tablet Take 1 tablet (5 mg total) by mouth 2 (two) times daily. 03/20/19  Yes Fulp, Cammie, MD  ascorbic acid (VITAMIN C) 500 MG tablet Take 1 tablet (500 mg total) by mouth 2 (two) times daily. 03/05/19  Yes Swayze, Ava, DO  diclofenac sodium (VOLTAREN) 1 % GEL Apply 4 g topically 4 (four) times daily. Apply to feet 4 times daily PRN for pain. Patient taking differently: Apply 4 g topically 4 (four) times daily as needed (knee pain).  09/26/18  Yes Fulp, Cammie, MD  diltiazem (CARDIZEM CD) 120 MG 24 hr capsule Take 1 capsule (120 mg total) by mouth daily. 03/05/19 03/04/20 Yes Swayze, Ava, DO  furosemide (LASIX) 20 MG tablet Take 1 tablet (20 mg total) by mouth daily as needed. 03/20/19  Yes Scot Jun, FNP  gabapentin (NEURONTIN) 100 MG capsule Take 1 capsule (100 mg total) by mouth 3 (three) times daily. 08/08/18  Yes Fulp, Cammie, MD  Ipratropium-Albuterol (COMBIVENT) 20-100 MCG/ACT AERS respimat Inhale 2 puffs into the lungs 3 (three) times daily. 03/05/19  Yes Swayze, Ava, DO  loratadine (CLARITIN) 10 MG tablet Take 1 tablet (10 mg total) by mouth daily. 03/05/19  Yes Swayze, Ava, DO  pantoprazole (PROTONIX) 40 MG tablet Take 1 tablet (40 mg total) by mouth daily at 6 (six) AM. 03/06/19  Yes Swayze, Ava, DO  rosuvastatin (CRESTOR) 10 MG tablet Take 1 tablet (10  mg total) by mouth daily. To lower cholesterol 08/09/18  Yes Fulp, Cammie, MD  triamcinolone cream (KENALOG) 0.1 % Apply to dry skin 1-2 times daily sparingly Patient taking differently: Apply 1 application topically See admin instructions. Apply sparingly to dark spots on face as needed for itching. 09/26/18  Yes Fulp, Cammie, MD  zinc sulfate 220 (50 Zn) MG capsule Take 1 capsule (220 mg total) by mouth daily. 03/05/19  Yes Swayze, Ava, DO  cefdinir (OMNICEF) 300 MG capsule  Take 1 capsule (300 mg total) by mouth 2 (two) times daily for 10 days. 03/20/19 03/30/19  Scot Jun, FNP    Past Medical, Surgical Family and Social History reviewed and updated.    Objective:   Today's Vitals   03/20/19 1823  BP: (!) 144/92  Pulse: 92  SpO2: 98%  Weight: 196 lb (88.9 kg)    Wt Readings from Last 3 Encounters:  03/20/19 196 lb (88.9 kg)  03/13/19 196 lb (88.9 kg)  03/05/19 202 lb 6.1 oz (91.8 kg)   Physical Exam Constitutional:      Comments: Chronically ill-appearing   HENT:     Head: Normocephalic.  Cardiovascular:     Rate and Rhythm: Rhythm irregular.  Pulmonary:     Effort: Pulmonary effort is normal.     Breath sounds: Decreased air movement present. No decreased breath sounds.  Musculoskeletal:     Cervical back: Normal range of motion.     Right lower leg: 1+ Edema present.     Left lower leg: No edema.  Skin:    General: Skin is warm.  Neurological:     Mental Status: She is oriented to person, place, and time.  Psychiatric:        Attention and Perception: Attention normal.        Mood and Affect: Mood normal.       Assessment & Plan:  1. Atrial fibrillation, unspecified type ([place]), rate controlled today Patient is scheduled to follow-up with cardiology 04/17/19 Continue Eliquis and Cardizem  Check CBC and CMP  2. Essential hypertension, elevated today Continue Amlodipine and Cardizem  Check Comp Met (CMET) and CBC with Differential/Platelet  3. Dyspnea, unspecified type, secondary to persistent COVID PNA  - CBC with Differential/Platelet -Continue Combivent   4. Persistent pneumonia, secondary to COVID-19 -Start Omnicef 300 mg twice daily x 10 days  -Continue Combivent   5. Elevated Glucose -Required insulin during hospitalization. Last A1C 6.2 -Repeat A1C today -CMP pending   6. Leg Edema, Right, resolved -Continue Furosemide 20 mg PRN at the onset of leg swelling     Meds ordered this encounter  Medications   . cefdinir (OMNICEF) 300 MG capsule    Sig: Take 1 capsule (300 mg total) by mouth 2 (two) times daily for 10 days.    Dispense:  20 capsule    Refill:  0  . furosemide (LASIX) 20 MG tablet    Sig: Take 1 tablet (20 mg total) by mouth daily as needed.    Dispense:  30 tablet    Refill:  0    -The patient was given clear instructions to go to ER or return to medical center if symptoms do not improve, worsen or new problems develop. The patient verbalized understanding.     Molli Barrows, FNP-C [facility], PRN Provider  [facility]. [place], [place] Clinic Phone: [phone] Clinic Fax: [phone] Clinic Hours: 5:30 pm -7:30 pm (Monday-Friday)

## 2019-03-21 LAB — COMPREHENSIVE METABOLIC PANEL
ALT: 17 IU/L (ref 0–32)
AST: 15 IU/L (ref 0–40)
Albumin/Globulin Ratio: 0.9 — ABNORMAL LOW (ref 1.2–2.2)
Albumin: 3.6 g/dL — ABNORMAL LOW (ref 3.8–4.8)
Alkaline Phosphatase: 100 IU/L (ref 39–117)
BUN/Creatinine Ratio: 10 — ABNORMAL LOW (ref 12–28)
BUN: 9 mg/dL (ref 8–27)
Bilirubin Total: <0.2 mg/dL (ref 0.0–1.2)
CO2: 17 mmol/L — ABNORMAL LOW (ref 20–29)
Calcium: 9.5 mg/dL (ref 8.7–10.3)
Chloride: 109 mmol/L — ABNORMAL HIGH (ref 96–106)
Creatinine, Ser: 0.91 mg/dL (ref 0.57–1.00)
GFR calc Af Amer: 74 mL/min/{1.73_m2} (ref >59)
GFR calc non Af Amer: 64 mL/min/{1.73_m2} (ref >59)
Globulin, Total: 4.1 g/dL (ref 1.5–4.5)
Glucose: 141 mg/dL — ABNORMAL HIGH (ref 65–99)
Potassium: 3.8 mmol/L (ref 3.5–5.2)
Sodium: 148 mmol/L — ABNORMAL HIGH (ref 134–144)
Total Protein: 7.7 g/dL (ref 6.0–8.5)

## 2019-03-21 LAB — CBC WITH DIFFERENTIAL/PLATELET
Basophils Absolute: 0 10*3/uL (ref 0.0–0.2)
Basos: 0 %
EOS (ABSOLUTE): 0.1 10*3/uL (ref 0.0–0.4)
Eos: 2 %
Hematocrit: 41 % (ref 34.0–46.6)
Hemoglobin: 13 g/dL (ref 11.1–15.9)
Immature Grans (Abs): 0 10*3/uL (ref 0.0–0.1)
Immature Granulocytes: 1 %
Lymphocytes Absolute: 2.1 10*3/uL (ref 0.7–3.1)
Lymphs: 38 %
MCH: 26.4 pg — ABNORMAL LOW (ref 26.6–33.0)
MCHC: 31.7 g/dL (ref 31.5–35.7)
MCV: 83 fL (ref 79–97)
Monocytes Absolute: 0.4 10*3/uL (ref 0.1–0.9)
Monocytes: 8 %
Neutrophils Absolute: 2.7 10*3/uL (ref 1.4–7.0)
Neutrophils: 51 %
Platelets: 274 10*3/uL (ref 150–450)
RBC: 4.92 x10E6/uL (ref 3.77–5.28)
RDW: 16.4 % — ABNORMAL HIGH (ref 11.7–15.4)
WBC: 5.4 10*3/uL (ref 3.4–10.8)

## 2019-03-21 LAB — HEMOGLOBIN A1C
Est. average glucose Bld gHb Est-mCnc: 157 mg/dL
Hgb A1c MFr Bld: 7.1 % — ABNORMAL HIGH (ref 4.8–5.6)

## 2019-03-21 NOTE — Progress Notes (Signed)
Thank you :)

## 2019-03-22 NOTE — Progress Notes (Signed)
See attached labs

## 2019-03-27 ENCOUNTER — Encounter: Payer: Self-pay | Admitting: Pediatric Intensive Care

## 2019-03-27 NOTE — Congregational Nurse Program (Signed)
  Dept: [place] Nurse Program Note  Date of Encounter: 03/27/2019  Past Medical History: Past Medical History:  Diagnosis Date  . Hypertension     Encounter Details:Home visit to refill medication boxes, patient education. Interpretation via client roommate Joelle. Client states that she is feeling stronger, denies dizziness with position changes. She has been taking medications as directed and states good response from furosemide. She continues to have problems with BMs. HRR- 70s. Pulse equal, strong. CRT less than 2sec. Minimal lower extremity edema noted. BBS- CTA except right base. Good air movement noted. Cn gave extensive explanation of recent pneumonia diagnosis in simple terms and client states that she understands  now. CN explained antibiotic course- client to start first dose tonight. CN will return Wednesday of next week for follow up. CN advised client of cardiology appointment and folow up appointment with Dr Chapman Fitch on 3/18. Lisette Abu RN BSN CNP [phone]

## 2019-04-02 ENCOUNTER — Encounter: Payer: Self-pay | Admitting: Pediatric Intensive Care

## 2019-04-02 NOTE — Congregational Nurse Program (Signed)
  Dept: [phone]   Congregational Nurse Program Note  Date of Encounter: 04/02/2019  Past Medical History: Past Medical History:  Diagnosis Date  . Hypertension     Encounter Details:Home visit to fill medication boxes. Interpretation via [employer] # [id number] Client states she feels well. States her breathing is good. BBS- CTA, good movement except right base is decreased. CN advised client that she will return tomorrow with prescription refills for amlodipine and protonix. Shann Medal RN BSN CNP [phone]

## 2019-04-03 ENCOUNTER — Telehealth: Payer: Self-pay | Admitting: Pediatric Intensive Care

## 2019-04-03 ENCOUNTER — Telehealth: Payer: Self-pay

## 2019-04-03 MED ORDER — PANTOPRAZOLE SODIUM 40 MG PO TBEC: 40.0000 mg | DELAYED_RELEASE_TABLET | Freq: Every day | ORAL | 2 refills | Status: DC

## 2019-04-03 NOTE — Telephone Encounter (Signed)
Call to [facility] at [facility]- need refill for diltiazem. She will call back when ready. Shann Medal Rn BSn CNP [phone]

## 2019-04-03 NOTE — Telephone Encounter (Signed)
Rx sent 

## 2019-04-03 NOTE — Telephone Encounter (Signed)
Shann Medal, RN/ congregational nurse is requesting refill of protonix for the patient.

## 2019-04-04 ENCOUNTER — Encounter: Payer: Self-pay | Admitting: Pediatric Intensive Care

## 2019-04-04 NOTE — Congregational Nurse Program (Signed)
  Dept: [phone]   Congregational Nurse Program Note  Date of Encounter: 04/04/2019  Past Medical History: Past Medical History:  Diagnosis Date  . Hypertension     Encounter Details:Home visit to arrange medication. Client does not have amlodipine or gabapentin.  Client states that her roommate is asking her to leave again. Shann Medal RN BSN CNP [phone]

## 2019-04-17 ENCOUNTER — Ambulatory Visit: Payer: Self-pay | Admitting: Cardiology

## 2019-04-19 ENCOUNTER — Ambulatory Visit: Payer: Self-pay | Admitting: Family Medicine

## 2019-04-29 ENCOUNTER — Ambulatory Visit: Payer: Self-pay | Attending: Internal Medicine

## 2019-04-29 DIAGNOSIS — Z23 Encounter for immunization: Secondary | ICD-10-CM

## 2019-04-29 NOTE — Progress Notes (Signed)
   Covid-19 Vaccination Clinic  Name:  Roneka Gilpin    MRN: [id number] DOB: 25-Mar-1950  04/29/2019  Ms. Martie Muhlbauer was observed post Covid-19 immunization for 15 minutes without incident. She was provided with Vaccine Information Sheet and instruction to access the V-Safe system.   Ms. Kailan Carmen was instructed to call 911 with any severe reactions post vaccine: Marland Kitchen Difficulty breathing  . Swelling of face and throat  . A fast heartbeat  . A bad rash all over body  . Dizziness and weakness   Immunizations Administered    Name Date Dose VIS Date Route   JANSSEN COVID-19 VACCINE 04/29/2019 11:13 AM 0.5 mL 03/30/2019 Intramuscular   Manufacturer: [employer]   Lot: [id number]   NDC: [phone]

## 2019-04-30 NOTE — Telephone Encounter (Signed)
disregard

## 2019-05-08 ENCOUNTER — Ambulatory Visit: Payer: Self-pay | Admitting: Family Medicine

## 2019-05-14 ENCOUNTER — Encounter: Payer: Self-pay | Admitting: Pediatric Intensive Care

## 2019-05-14 ENCOUNTER — Telehealth: Payer: Self-pay | Admitting: Pediatric Intensive Care

## 2019-05-14 NOTE — Telephone Encounter (Signed)
Call to pharmacy to refill medication. Shann Medal RN BSN CNP [phone]

## 2019-05-14 NOTE — Progress Notes (Signed)
Chief Complaint  Patient presents with  . New Patient (Initial Visit)    History of Present Illness: 69 yo female with history of paroxysmal atrial fibrillation, HTN, CKD and arthritis who is here today for follow up. She was seen in the hospital in January 2021 by Dr. Mayford Knife for new onset atrial fibrillation with RVR. She was admitted with Covid 19 related pneumonia. She was rate controlled with Cardizem and was started on Eliquis. Echo January 2021 with LVEF=60-65%. Mild MR. She had lower exremity   She is here today for follow up. The patient denies any chest pain, dyspnea, palpitations, lower extremity edema, orthopnea, PND, dizziness, near syncope or syncope.   Primary Care Physician: Cain Saupe, MD  Primary Cardiologist: Armanda Magic  Past Medical History:  Diagnosis Date  . Acute kidney injury (HCC) 03/02/2019  . Acute respiratory failure with hypoxia (HCC) 02/22/2019  . Atrial fibrillation with RVR (HCC) 02/26/2019  . COVID-19 02/22/2019  . Flu-like symptoms 02/22/2019  . Hypertension   . Knee osteoarthritis 10/24/2017    Past Surgical History:  Procedure Laterality Date  . NO PAST SURGERIES     Current Outpatient Medications  Medication Sig Dispense Refill  . acetaminophen-codeine (TYLENOL #3) 300-30 MG tablet Take 1 tablet by mouth every 6 (six) hours as needed for moderate pain. (Patient taking differently: Take 1 tablet by mouth every 6 (six) hours as needed (arthritis pain). ) 60 tablet 3  . amLODipine (NORVASC) 2.5 MG tablet Take 2 tablets (5 mg total) by mouth daily. To lower blood pressure 180 tablet 3  . apixaban (ELIQUIS) 5 MG TABS tablet Take 1 tablet (5 mg total) by mouth 2 (two) times daily. 60 tablet 2  . ascorbic acid (VITAMIN C) 500 MG tablet Take 1 tablet (500 mg total) by mouth 2 (two) times daily. 60 tablet 0  . diclofenac sodium (VOLTAREN) 1 % GEL Apply 4 g topically 4 (four) times daily. Apply to feet 4 times daily PRN for pain. (Patient taking  differently: Apply 4 g topically 4 (four) times daily as needed (knee pain). ) 100 g prn  . diltiazem (CARDIZEM CD) 120 MG 24 hr capsule Take 1 capsule (120 mg total) by mouth daily. 30 capsule 11  . furosemide (LASIX) 20 MG tablet Take 1 tablet (20 mg total) by mouth daily as needed. 30 tablet 0  . gabapentin (NEURONTIN) 100 MG capsule Take 1 capsule (100 mg total) by mouth 3 (three) times daily. 90 capsule 6  . Ipratropium-Albuterol (COMBIVENT) 20-100 MCG/ACT AERS respimat Inhale 2 puffs into the lungs 3 (three) times daily. 4 g 0  . loratadine (CLARITIN) 10 MG tablet Take 1 tablet (10 mg total) by mouth daily. 30 tablet 0  . MAGNESIUM PO Take 1 tablet by mouth daily.    . pantoprazole (PROTONIX) 40 MG tablet Take 1 tablet (40 mg total) by mouth daily at 6 (six) AM. 30 tablet 2  . rosuvastatin (CRESTOR) 10 MG tablet Take 1 tablet (10 mg total) by mouth daily. To lower cholesterol 90 tablet 1  . triamcinolone cream (KENALOG) 0.1 % Apply to dry skin 1-2 times daily sparingly (Patient taking differently: Apply 1 application topically See admin instructions. Apply sparingly to dark spots on face as needed for itching.) 30 g 2  . zinc sulfate 220 (50 Zn) MG capsule Take 1 capsule (220 mg total) by mouth daily. 30 capsule 0   No current facility-administered medications for this visit.    No Known Allergies  Social History   Socioeconomic History  . Marital status: Widowed    Spouse name: Not on file  . Number of children: Not on file  . Years of education: Not on file  . Highest education level: Not on file  Occupational History  . Not on file  Tobacco Use  . Smoking status: Never Smoker  . Smokeless tobacco: Never Used  Substance and Sexual Activity  . Alcohol use: Never  . Drug use: Never  . Sexual activity: Not Currently  Other Topics Concern  . Not on file  Social History Narrative  . Not on file   Social Determinants of Health   Financial Resource Strain:   . Difficulty of  Paying Living Expenses:   Food Insecurity:   . Worried About Programme researcher, broadcasting/film/video in the Last Year:   . Barista in the Last Year:   Transportation Needs:   . Freight forwarder (Medical):   Marland Kitchen Lack of Transportation (Non-Medical):   Physical Activity:   . Days of Exercise per Week:   . Minutes of Exercise per Session:   Stress:   . Feeling of Stress :   Social Connections:   . Frequency of Communication with Friends and Family:   . Frequency of Social Gatherings with Friends and Family:   . Attends Religious Services:   . Active Member of Clubs or Organizations:   . Attends Banker Meetings:   Marland Kitchen Marital Status:   Intimate Partner Violence:   . Fear of Current or Ex-Partner:   . Emotionally Abused:   Marland Kitchen Physically Abused:   . Sexually Abused:     Family History  Family history unknown: Yes    Review of Systems:  As stated in the HPI and otherwise negative.   BP 128/74   Pulse 70   Ht 5\' 4"  (1.626 m)   Wt 192 lb 9.6 oz (87.4 kg)   SpO2 96%   BMI 33.06 kg/m   Physical Examination: General: Well developed, well nourished, NAD  HEENT: OP clear, mucus membranes moist  SKIN: warm, dry. No rashes. Neuro: No focal deficits  Musculoskeletal: Muscle strength 5/5 all ext  Psychiatric: Mood and affect normal  Neck: No JVD, no carotid bruits, no thyromegaly, no lymphadenopathy.  Lungs:Clear bilaterally, no wheezes, rhonci, crackles Cardiovascular: Regular rate and rhythm. No murmurs, gallops or rubs. Abdomen:Soft. Bowel sounds present. Non-tender.  Extremities: No lower extremity edema. Pulses are 2 + in the bilateral DP/PT.  EKG:  EKG is ordered today. The ekg ordered today demonstrates sinus  Recent Labs: 02/22/2019: B Natriuretic Peptide 190.4 02/27/2019: TSH 0.080 02/28/2019: Magnesium 2.2 03/20/2019: ALT 17; BUN 9; Creatinine, Ser 0.91; Hemoglobin 13.0; Platelets 274; Potassium 3.8; Sodium 148   Lipid Panel    Component Value Date/Time   CHOL  211 (H) 08/08/2018 1145   TRIG 102 02/22/2019 1703   HDL 45 08/08/2018 1145   CHOLHDL 4.7 (H) 08/08/2018 1145   LDLCALC 150 (H) 08/08/2018 1145     Wt Readings from Last 3 Encounters:  05/15/19 192 lb 9.6 oz (87.4 kg)  03/20/19 196 lb (88.9 kg)  03/13/19 196 lb (88.9 kg)     Assessment and Plan:   1. Paroxysmal atrial fibrillation: Sinus today. CHADS VASC score 3. Will continue Cardizem and Eliquis. Echo with normal LV systolic function.   2. Chronic diastolic CHF: No LE edema today. Lasix as needed.   Current medicines are reviewed at length with the patient today.  The patient does not have concerns regarding medicines.  The following changes have been made:  no change  Labs/ tests ordered today include:   Orders Placed This Encounter  Procedures  . EKG 12-Lead     Disposition:   FU with Dr. Radford Pax in 6 months.    Signed, Lauree Chandler, MD 05/15/2019 10:57 AM    [facility] [address], [place], [place]  [address] Phone: [phone]; Fax: [phone]

## 2019-05-15 ENCOUNTER — Ambulatory Visit (INDEPENDENT_AMBULATORY_CARE_PROVIDER_SITE_OTHER): Payer: Self-pay | Admitting: Cardiovascular Disease

## 2019-05-15 ENCOUNTER — Other Ambulatory Visit: Payer: Self-pay

## 2019-05-15 ENCOUNTER — Encounter: Payer: Self-pay | Admitting: Cardiovascular Disease

## 2019-05-15 VITALS — BP 128/74 | HR 70 | Ht 64.0 in | Wt 192.6 lb

## 2019-05-15 DIAGNOSIS — I5032 Chronic diastolic (congestive) heart failure: Secondary | ICD-10-CM

## 2019-05-15 DIAGNOSIS — I480 Paroxysmal atrial fibrillation: Secondary | ICD-10-CM

## 2019-05-15 NOTE — Patient Instructions (Signed)
Medication Instructions:  No changes *If you need a refill on your cardiac medications before your next appointment, please call your pharmacy*   Lab Work: none If you have labs (blood work) drawn today and your tests are completely normal, you will receive your results only by: Marland Kitchen MyChart Message (if you have MyChart) OR . A paper copy in the mail If you have any lab test that is abnormal or we need to change your treatment, we will call you to review the results.   Testing/Procedures: none   Follow-Up: At [facility], you and your health needs are our priority.  As part of our continuing mission to provide you with exceptional heart care, we have created designated Provider Care Teams.  These Care Teams include your primary Cardiologist (physician) and Advanced Practice Providers (APPs -  Physician Assistants and Nurse Practitioners) who all work together to provide you with the care you need, when you need it.  We recommend signing up for the patient portal called "MyChart".  Sign up information is provided on this After Visit Summary.  MyChart is used to connect with patients for Virtual Visits (Telemedicine).  Patients are able to view lab/test results, encounter notes, upcoming appointments, etc.  Non-urgent messages can be sent to your provider as well.   To learn more about what you can do with MyChart, go to [email or web address].    Your next appointment:   6 month(s)  The format for your next appointment:   In Person  Provider:   You may see Armanda Magic, MD or one of the following Advanced Practice Providers on your designated Care Team:    Ronie Spies, PA-C  Jacolyn Reedy, PA-C    Other Instructions

## 2019-05-16 ENCOUNTER — Ambulatory Visit: Payer: Self-pay | Attending: Family Medicine | Admitting: Physician Assistant

## 2019-05-16 DIAGNOSIS — E7800 Pure hypercholesterolemia, unspecified: Secondary | ICD-10-CM

## 2019-05-16 DIAGNOSIS — M25561 Pain in right knee: Secondary | ICD-10-CM

## 2019-05-16 DIAGNOSIS — E1165 Type 2 diabetes mellitus with hyperglycemia: Secondary | ICD-10-CM

## 2019-05-16 DIAGNOSIS — M25562 Pain in left knee: Secondary | ICD-10-CM

## 2019-05-16 DIAGNOSIS — G8929 Other chronic pain: Secondary | ICD-10-CM

## 2019-05-16 DIAGNOSIS — R600 Localized edema: Secondary | ICD-10-CM

## 2019-05-16 MED ORDER — ROSUVASTATIN CALCIUM 10 MG PO TABS: 10.0000 mg | ORAL_TABLET | Freq: Every day | ORAL | 1 refills | Status: DC

## 2019-05-16 MED ORDER — GLIPIZIDE 5 MG PO TABS: 2.5000 mg | ORAL_TABLET | Freq: Every day | ORAL | 3 refills | Status: DC

## 2019-05-16 MED ORDER — FUROSEMIDE 20 MG PO TABS: 20.0000 mg | ORAL_TABLET | Freq: Every day | ORAL | 1 refills | Status: DC | PRN

## 2019-05-16 NOTE — Progress Notes (Signed)
Out pt for leg [facility]

## 2019-05-16 NOTE — Progress Notes (Signed)
Patient ID: [name] [name], adult   DOB: December 23, 1950, 69 y.o.   MRN: [id number] Virtual Visit via Telephone Note  I connected with [name] [name] on 05/16/19 at  8:30 AM EDT by telephone and verified that I am speaking with the correct person using two identifiers.   I discussed the limitations, risks, security and privacy concerns of performing an evaluation and management service by telephone and the availability of in person appointments. I also discussed with the patient that there may be a patient responsible charge related to this service. The patient expressed understanding and agreed to proceed.  PATIENT visit by telephone virtually in the context of Covid-19 pandemic. Patient location:  home My Location:  [facility] office Persons on the call:  Me, the patient, and her nurse/translator   History of Present Illness:  Patient is requesting PT for B knees.  She has been told she needs B knee replacement and that PT might help.  Patient's last A1C was 7.1 03/2019.  Not on meds.  GFR is impaired. Denies s/sx hyper/hypoglycemia.      Observations/Objective:  NAD.  A&Ox3   Assessment and Plan: 1. Type 2 diabetes mellitus with hyperglycemia, unspecified whether long term insulin use (HCC) I have had a lengthy discussion and provided education about insulin resistance and the intake of too much sugar/refined carbohydrates.  I have advised the patient to work at a goal of eliminating sugary drinks, candy, desserts, sweets, refined sugars, processed foods, and white carbohydrates.  The patient expresses understanding.  - glipiZIDE (GLUCOTROL) 5 MG tablet; Take 0.5 tablets (2.5 mg total) by mouth daily before breakfast. For high blood sugar  Dispense: 30 tablet; Refill: 3  2. Chronic pain of both knees - Ambulatory referral to Physical Therapy  3. Leg edema, right - furosemide (LASIX) 20 MG tablet; Take 1 tablet (20 mg total) by mouth daily as needed. Prn swelling  Dispense: 30  tablet; Refill: 1  4. Hypercholesterolemia - rosuvastatin (CRESTOR) 10 MG tablet; Take 1 tablet (10 mg total) by mouth daily. To lower cholesterol  Dispense: 90 tablet; Refill: 1   Follow Up Instructions: See PCP in 2-3 months;  Chronic conditions   I discussed the assessment and treatment plan with the patient. The patient was provided an opportunity to ask questions and all were answered. The patient agreed with the plan and demonstrated an understanding of the instructions.   The patient was advised to call back or seek an in-person evaluation if the symptoms worsen or if the condition fails to improve as anticipated.  I provided 12 minutes of non-face-to-face time during this encounter.   Georgian Co, PA-C

## 2019-05-20 NOTE — Congregational Nurse Program (Signed)
  Dept: [phone]   Congregational Nurse Program Note  Date of Encounter: 05/14/2019  Past Medical History: Past Medical History:  Diagnosis Date  . Acute kidney injury (HCC) 03/02/2019  . Acute respiratory failure with hypoxia (HCC) 02/22/2019  . Atrial fibrillation with RVR (HCC) 02/26/2019  . COVID-19 02/22/2019  . Flu-like symptoms 02/22/2019  . Hypertension   . Knee osteoarthritis 10/24/2017    Encounter Details:Visit to set up medication for client. Shann Medal RN BSN CNP [phone]

## 2019-05-22 ENCOUNTER — Encounter: Payer: Self-pay | Admitting: Pediatric Intensive Care

## 2019-05-22 NOTE — Congregational Nurse Program (Signed)
  Dept: [phone]   Congregational Nurse Program Note  Date of Encounter: 05/22/2019  Past Medical History: Past Medical History:  Diagnosis Date  . Acute kidney injury (HCC) 03/02/2019  . Acute respiratory failure with hypoxia (HCC) 02/22/2019  . Atrial fibrillation with RVR (HCC) 02/26/2019  . COVID-19 02/22/2019  . Flu-like symptoms 02/22/2019  . Hypertension   . Knee osteoarthritis 10/24/2017    Encounter Details:[facility]ia [employer] interpretation - lingala interpreter, explained that she would start glipizide at breakfast each day for improved blood sugar control. CN reviewed portion size for foods with starch

## 2019-05-24 ENCOUNTER — Encounter: Payer: Self-pay | Admitting: Physical Therapy

## 2019-05-24 ENCOUNTER — Other Ambulatory Visit: Payer: Self-pay

## 2019-05-24 ENCOUNTER — Ambulatory Visit: Payer: Self-pay | Attending: Physician Assistant | Admitting: Physical Therapy

## 2019-05-24 DIAGNOSIS — R262 Difficulty in walking, not elsewhere classified: Secondary | ICD-10-CM | POA: Insufficient documentation

## 2019-05-24 DIAGNOSIS — M25561 Pain in right knee: Secondary | ICD-10-CM | POA: Insufficient documentation

## 2019-05-24 DIAGNOSIS — M25562 Pain in left knee: Secondary | ICD-10-CM | POA: Insufficient documentation

## 2019-05-24 DIAGNOSIS — R600 Localized edema: Secondary | ICD-10-CM | POA: Insufficient documentation

## 2019-05-24 DIAGNOSIS — G8929 Other chronic pain: Secondary | ICD-10-CM | POA: Insufficient documentation

## 2019-05-24 DIAGNOSIS — M6281 Muscle weakness (generalized): Secondary | ICD-10-CM | POA: Insufficient documentation

## 2019-05-24 NOTE — Therapy (Signed)
[place], [place], [address] Phone: [phone]   Fax:  [phone]  Physical Therapy Evaluation  Patient Details  Name: [name] [name] MRN: [id number] Date of Birth: [birth year] Referring Provider (PT): Jens Som, NP    Encounter Date: 05/24/2019  PT End of Session - 05/24/19 1032    Visit Number  1    Number of Visits  16    Date for PT Re-Evaluation  07/19/19    Authorization Type  CAFA    PT Start Time  0835    PT Stop Time  0915    PT Time Calculation (min)  40 min    Activity Tolerance  Patient tolerated treatment well;Patient limited by pain    Behavior During Therapy  [facility] for tasks assessed/performed       Past Medical History:  Diagnosis Date  . Acute kidney injury ([place]) 03/02/2019  . Acute respiratory failure with hypoxia ([place]) 02/22/2019  . Atrial fibrillation with RVR ([place]) 02/26/2019  . COVID-19 02/22/2019  . Flu-like symptoms 02/22/2019  . Hypertension   . Knee osteoarthritis 10/24/2017    Past Surgical History:  Procedure Laterality Date  . NO PAST SURGERIES      There were no vitals filed for this visit.   Subjective Assessment - 05/24/19 0839    Subjective  Patient presents with bilateral knee pain which she reports increased following a 2 week hospital stay for [place] in Jan/Feb 2021.  Prior she walked without the walker.  She is mostly concerned with the Rt knee , and how crooked it is.    Patient is accompained by:  Interpreter    Pertinent History  COVID, hospitalization 2 weeks with HHPT upon discharge, A-fib, knee OA, HTN    Limitations  Standing;Walking;House hold activities;Lifting    Diagnostic tests  Dr. Marlou Sa XR: Varus alignment is present, severe on Rt.  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    Patient Stated Goals  Pt wants to be able to walk more.    Currently in Pain?  Yes    Pain Score  10-Worst pain  ever    Pain Location  Knee    Pain Orientation  Right    Pain Descriptors / Indicators  Aching;Tightness    Pain Type  Chronic pain    Pain Onset  More than a month ago    Pain Frequency  Constant    Aggravating Factors   walking, bending knee    Pain Relieving Factors  rest, sitting    Effect of Pain on Daily Activities  painful all the time    Multiple Pain Sites  No         [facility] PT Assessment - 05/24/19 0001      Assessment   Medical Diagnosis  bilateral knee pain     Referring Provider (PT)  Jens Som, NP     Next MD Visit  has appt for [facility] next wek     Prior Therapy  HHPT       Precautions   Precautions  None      Restrictions   Weight Bearing Restrictions  No      Balance Screen   Has the patient fallen in the past 6 months  No      [place] residence    Living Arrangements  Non-relatives/Friends    Home Access  Level entry  Additional Comments  lives with [employer] roommates? she is well cared for       Prior Function   Level of Independence  Independent;Independent with household mobility with device;Independent with community mobility with device      Cognition   Overall Cognitive Status  Within Functional Limits for tasks assessed      Observation/Other Assessments   Focus on Therapeutic Outcomes (FOTO)   NT language       Circumferential Edema   Circumferential - Right  18.25 inch     Circumferential - Left   17 .25 inch       Sensation   Light Touch  Appears Intact      Posture/Postural Control   Posture/Postural Control  Postural limitations    Postural Limitations  Rounded Shoulders;Forward head;Flexed trunk    Posture Comments  signifiant Rt >L genu varus significant R>L.       AROM   Right Knee Extension  0    Right Knee Flexion  105   pain    Left Knee Extension  0    Left Knee Flexion  110      Strength   Right Knee Flexion  4-/5    Right Knee Extension  4/5   pain    Left Knee Flexion   3+/5    Left Knee Extension  4/5      Palpation   Patella mobility  painful , hypomobile     Palpation comment  ant medial knee discomfort       Transfers   Five time sit to stand comments   26 sec       Ambulation/Gait   Ambulation Distance (Feet)  100 Feet    Assistive device  Rolling walker    Ambulation Surface  Level;Indoor          Objective measurements completed on examination: See above findings.      PT Education - 05/24/19 1032    Education Details  POC, PT and HEP    Person(s) Educated  Patient    Methods  Explanation;Handout    Comprehension  Verbalized understanding;Returned demonstration       PT Short Term Goals - 05/24/19 1033      PT SHORT TERM GOAL #1   Title  Pt will be I with initial HEP for knee AROM and strength    Time  4    Period  Weeks    Status  New    Target Date  06/21/19      PT SHORT TERM GOAL #2   Title  Pt will be able to perform SLR on Rt LE without assist for improved sit to supine    Time  4    Period  Weeks    Status  New    Target Date  06/21/19      PT SHORT TERM GOAL #3   Title  Pt will report less pain in Rt knee when sitting for improved comfort and rest at night    Time  4    Period  Weeks    Status  New    Target Date  06/21/19        PT Long Term Goals - 05/24/19 1036      PT LONG TERM GOAL #1   Title  Pt will be I with HEP for LE strength and flexibility    Time  8    Period  Weeks    Status  New  Target Date  07/19/19      PT LONG TERM GOAL #2   Title  Pt will be able to perform sit to stand x 5 in under 20 sec to demo increased functional mobility    Time  8    Period  Weeks    Status  New    Target Date  07/19/19      PT LONG TERM GOAL #3   Title  Pt will be able to walk in her home without the walker and min increase in pain from baseline.    Time  8    Period  Weeks    Status  New    Target Date  07/19/19      PT LONG TERM GOAL #4   Title  Pt will be able to walk in the community  with walker (15 min ) and min increase in pain in knees    Time  8    Period  Weeks    Status  New    Target Date  07/19/19      PT LONG TERM GOAL #5   Title  Pt will be able to increase bilateral knee strength by 1/2 muscle grade in flexion and extension.    Time  8    Period  Weeks    Status  New    Target Date  07/19/19             Plan - 05/24/19 1042    Clinical Impression Statement  Pt presents for low complexity eval of knee pain which has worsened since being deconditioned after COVID infection.  She has significant varus deformity in bilateral knees, Rt sided knee pain is more intense than Lt.  She is limited in knee ROM and strength as well.  Needs walker for pain relief and stability in the community.  She will have limited progress due to severity but according to her her knees were not this painful or "crooked" prior to being hospitalized.  She may be able to progress to a cane but will likely not be free of an assistive device in 8 weeks.    Personal Factors and Comorbidities  Age;Comorbidity 3+;Past/Current Experience;Social Background    Comorbidities  post COVID, obesity, HTN and Diabetes, knee OA    Examination-Activity Limitations  Stairs;Stand;Transfers;Locomotion Level;Squat    Examination-Participation Restrictions  Interpersonal Relationship;Community Activity    Stability/Clinical Decision Making  Stable/Uncomplicated    Clinical Decision Making  Low    Rehab Potential  Good    PT Frequency  2x / week    PT Duration  8 weeks    PT Treatment/Interventions  ADLs/Self Care Home Management;Electrical Stimulation;Cryotherapy;Iontophoresis 4mg /ml Dexamethasone;Moist Heat;Ultrasound;Gait training;Functional mobility training;Therapeutic activities;Neuromuscular re-education;Therapeutic exercise;Passive range of motion;Taping;Manual techniques;Patient/family education;DME Instruction;Balance training    PT Next Visit Plan  NuStep, 2 min vs 6 min walk test, progress  ther ex as tol    PT Home Exercise Plan  LAQ, quad set, bridge    Consulted and Agree with Plan of Care  Patient       Patient will benefit from skilled therapeutic intervention in order to improve the following deficits and impairments:  Decreased mobility, Difficulty walking, Increased edema, Obesity, Postural dysfunction, Impaired flexibility, Pain, Increased fascial restricitons, Hypomobility, Decreased strength, Decreased range of motion, Decreased endurance, Abnormal gait, Decreased activity tolerance  Visit Diagnosis: Chronic pain of right knee  Chronic pain of left knee  Muscle weakness (generalized)  Localized edema  Difficulty in walking, not  elsewhere classified     Problem List Patient Active Problem List   Diagnosis Date Noted  . Acute kidney injury (HCC) 03/02/2019  . Atrial fibrillation with RVR (HCC) 02/26/2019  . Acute respiratory failure with hypoxia (HCC) 02/22/2019  . COVID-19 02/22/2019  . Essential hypertension, benign 05/03/2018  . Hypertension 10/24/2017  . Knee osteoarthritis 10/24/2017    Kingjames Coury 05/24/2019, 10:53 AM  [facility] [address] [place], [place], [address] Phone: [phone]   Fax:  [phone]  Name: Brittnay Pigman MRN: [id number] Date of Birth: 08-01-1950   Karie Mainland, PT 05/24/19 10:55 AM Phone: [phone] Fax: [phone]

## 2019-05-24 NOTE — Patient Instructions (Signed)
[place] [employer] la jambe opre et essayer de la tenir leve ___5_ [employer]. Utiliser un poids de __0__ kg sur la [place]. Rpter ___10-20 _ fois. Faire ___2_ sances par jour.  http://gt2.exer.us/311   Copyright  VHI. All rights reserved.  Quad Set    Redresser une jambe et serrer les muscles de la cuisse lentement, en comptant jusqu' ___10_  haute voix. Relcher. Rpter [employer]. Rpter ___10_ [name] [name]. Faire __2__ sances par jour.  http://gt2.exer.us/276   Copyright  VHI. All rights reserved.  Bridging    Lever les fesses du sol lentement, en gardant le ventre serr. Rpter _10___ [name] [name] par srie. Faire ___2_ sries par sance. Faire _2___ sances par jour.  http://orth.exer.us/1097   Copyright  VHI. All rights reserved.

## 2019-05-29 ENCOUNTER — Encounter: Payer: Self-pay | Admitting: Pediatric Intensive Care

## 2019-05-29 NOTE — Congregational Nurse Program (Signed)
  Dept: [phone]   Congregational Nurse Program Note  Date of Encounter: 05/29/2019  Past Medical History: Past Medical History:  Diagnosis Date  . Acute kidney injury (HCC) 03/02/2019  . Acute respiratory failure with hypoxia (HCC) 02/22/2019  . Atrial fibrillation with RVR (HCC) 02/26/2019  . COVID-19 02/22/2019  . Flu-like symptoms 02/22/2019  . Hypertension   . Knee osteoarthritis 10/24/2017    Encounter Details: Medication and BP check. No Lingala interpretation available- [employer] interpreter-French-#[id number]. Client states that she has appointments tomorrow and Friday. Financial assistance appointment will need to be rescheduled to complete paperwork. Transportation services arranged for PT appointment on Friday. CN will visit WEdnesday 5/5 to check BG/BP and assist client with filling medication boxes. Client will need vision appointment but will bring readers for interim. Shann Medal RN BSN CNP [phone]

## 2019-05-30 ENCOUNTER — Ambulatory Visit: Payer: Self-pay

## 2019-05-31 ENCOUNTER — Ambulatory Visit: Payer: Self-pay | Admitting: Physical Therapy

## 2019-05-31 ENCOUNTER — Encounter: Payer: Self-pay | Admitting: Physical Therapy

## 2019-05-31 ENCOUNTER — Other Ambulatory Visit: Payer: Self-pay

## 2019-05-31 DIAGNOSIS — M25561 Pain in right knee: Secondary | ICD-10-CM

## 2019-05-31 DIAGNOSIS — R262 Difficulty in walking, not elsewhere classified: Secondary | ICD-10-CM

## 2019-05-31 DIAGNOSIS — G8929 Other chronic pain: Secondary | ICD-10-CM

## 2019-05-31 DIAGNOSIS — R600 Localized edema: Secondary | ICD-10-CM

## 2019-05-31 DIAGNOSIS — M6281 Muscle weakness (generalized): Secondary | ICD-10-CM

## 2019-05-31 NOTE — Patient Instructions (Signed)
Reprinted HEP from initial visit

## 2019-05-31 NOTE — Therapy (Addendum)
[place], [place], [address] Phone: [phone]   Fax:  [phone]  Physical Therapy Treatment  Patient Details  Name: [name] [name] MRN: [id number] Date of Birth: [birth year] Referring Provider (PT): Jens Som, NP    Encounter Date: 05/31/2019  PT End of Session - 05/31/19 1140    Visit Number  2    Number of Visits  16    Date for PT Re-Evaluation  07/19/19    Authorization Type  CAFA    PT Start Time PT End Time Total Time  6144   3154    63        Past Medical History:  Diagnosis Date  . Acute kidney injury ([place]) 03/02/2019  . Acute respiratory failure with hypoxia ([place]) 02/22/2019  . Atrial fibrillation with RVR ([place]) 02/26/2019  . COVID-19 02/22/2019  . Flu-like symptoms 02/22/2019  . Hypertension   . Knee osteoarthritis 10/24/2017    Past Surgical History:  Procedure Laterality Date  . NO PAST SURGERIES      There were no vitals filed for this visit.  Subjective Assessment - 05/31/19 1144    Subjective  Pt reports 10/10 pain in right knee.    Patient is accompained by:  Interpreter   on site interpreter: Jocelne   Pertinent History  COVID, hospitalization 2 weeks with HHPT upon discharge, A-fib, knee OA, HTN    Diagnostic tests  Dr. Marlou Sa XR: Varus alignment is present, severe on Rt.  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    Patient Stated Goals  Pt wants to be able to walk more.    Currently in Pain?  Yes    Pain Score  10-Worst pain ever    Pain Location  Knee    Pain Orientation  Right    Pain Descriptors / Indicators  Aching;Tightness    Aggravating Factors   walking, standing    Pain Relieving Factors  rest, sitting                       [facility] Adult PT Treatment/Exercise - 05/31/19 0001      Ambulation/Gait   Ambulation Distance (Feet)  67 Feet    Assistive device  Rolling walker    Ambulation Surface  Level;Indoor    Gait velocity  67 ft in 2 minutes    increased pain after standing at front desk to check in     Knee/Hip Exercises: Aerobic   Nustep  7 minutes L2 LE only       Knee/Hip Exercises: Seated   Long Arc Quad  20 reps;Right;Left    Ball Squeeze  x 20     Marching  20 reps    Hamstring Curl  20 reps    Hamstring Limitations  red band       Knee/Hip Exercises: Supine   Quad Sets  10 reps    Heel Slides  20 reps    Heel Slides Limitations  rt/left     Straight Leg Raises  --    Straight Leg Raises Limitations  --             PT Education - 05/31/19 1214    Education Details  Verbally reviewed initial HEP    Person(s) Educated  Patient    Methods  Explanation;Handout    Comprehension  Verbalized understanding       PT Short Term Goals - 05/24/19 1033  PT SHORT TERM GOAL #1   Title  Pt will be I with initial HEP for knee AROM and strength    Time  4    Period  Weeks    Status  New    Target Date  06/21/19      PT SHORT TERM GOAL #2   Title  Pt will be able to perform SLR on Rt LE without assist for improved sit to supine    Time  4    Period  Weeks    Status  New    Target Date  06/21/19      PT SHORT TERM GOAL #3   Title  Pt will report less pain in Rt knee when sitting for improved comfort and rest at night    Time  4    Period  Weeks    Status  New    Target Date  06/21/19        PT Long Term Goals - 05/24/19 1036      PT LONG TERM GOAL #1   Title  Pt will be I with HEP for LE strength and flexibility    Time  8    Period  Weeks    Status  New    Target Date  07/19/19      PT LONG TERM GOAL #2   Title  Pt will be able to perform sit to stand x 5 in under 20 sec to demo increased functional mobility    Time  8    Period  Weeks    Status  New    Target Date  07/19/19      PT LONG TERM GOAL #3   Title  Pt will be able to walk in her home without the walker and min increase in pain from baseline.    Time  8     Period  Weeks    Status  New    Target Date  07/19/19      PT LONG TERM GOAL #4   Title  Pt will be able to walk in the community with walker (15 min ) and min increase in pain in knees    Time  8    Period  Weeks    Status  New    Target Date  07/19/19      PT LONG TERM GOAL #5   Title  Pt will be able to increase bilateral knee strength by 1/2 muscle grade in flexion and extension.    Time  8    Period  Weeks    Status  New    Target Date  07/19/19            Plan - 05/31/19 1156    Clinical Impression Statement  Pt arrives with RW and stands for prolonged period at desk for Check-in. Attempted 2 minute walk test after brief rest period and she completed 67 ft and c/o increased right knee pain. Pt performed seated and supine AROM exercises with tactile and verbal cues. Able to perform all therex requested but with increased pain reported. Reprinted HEP and reviewed reps and sets as she reported she did not know what to do.    PT Next Visit Plan  NuStep, progress ther ex as tol, continue gait in clinic    PT Home Exercise Plan  LAQ, quad set, bridge       Patient will benefit from skilled therapeutic intervention in order to improve the following  deficits and impairments:  Decreased mobility, Difficulty walking, Increased edema, Obesity, Postural dysfunction, Impaired flexibility, Pain, Increased fascial restricitons, Hypomobility, Decreased strength, Decreased range of motion, Decreased endurance, Abnormal gait, Decreased activity tolerance  Visit Diagnosis: Chronic pain of right knee  Chronic pain of left knee  Muscle weakness (generalized)  Localized edema  Difficulty in walking, not elsewhere classified     Problem List Patient Active Problem List   Diagnosis Date Noted  . Acute kidney injury ([facility]) 03/02/2019  . Atrial fibrillation with RVR ([facility]) 02/26/2019  . Acute respiratory failure with hypoxia ([facility]) 02/22/2019  . COVID-19 02/22/2019  . Essential  hypertension, benign 05/03/2018  . Hypertension 10/24/2017  . Knee osteoarthritis 10/24/2017    Sherrie Mustache, PTA 05/31/2019, 12:19 PM  [facility] [address] [place], [place], [address] Phone: [phone]   Fax:  [phone]  Name: [name] [name] MRN: [id number] Date of Birth: [birth year]

## 2019-06-04 ENCOUNTER — Other Ambulatory Visit: Payer: Self-pay | Admitting: Family Medicine

## 2019-06-04 ENCOUNTER — Encounter: Payer: Self-pay | Admitting: Physical Therapy

## 2019-06-04 ENCOUNTER — Ambulatory Visit: Payer: Self-pay | Attending: Physician Assistant | Admitting: Physical Therapy

## 2019-06-04 ENCOUNTER — Other Ambulatory Visit: Payer: Self-pay

## 2019-06-04 DIAGNOSIS — M6281 Muscle weakness (generalized): Secondary | ICD-10-CM | POA: Insufficient documentation

## 2019-06-04 DIAGNOSIS — G8929 Other chronic pain: Secondary | ICD-10-CM | POA: Insufficient documentation

## 2019-06-04 DIAGNOSIS — R262 Difficulty in walking, not elsewhere classified: Secondary | ICD-10-CM | POA: Insufficient documentation

## 2019-06-04 DIAGNOSIS — R600 Localized edema: Secondary | ICD-10-CM | POA: Insufficient documentation

## 2019-06-04 DIAGNOSIS — M25561 Pain in right knee: Secondary | ICD-10-CM | POA: Insufficient documentation

## 2019-06-04 DIAGNOSIS — M25562 Pain in left knee: Secondary | ICD-10-CM | POA: Insufficient documentation

## 2019-06-04 NOTE — Therapy (Signed)
[place], [place], [address] Phone: [phone]   Fax:  [phone]  Physical Therapy Treatment  Patient Details  Name: [name] [name] MRN: [id number] Date of Birth: May 23, 1950 Referring Provider (PT): Jens Som, NP    Encounter Date: 06/04/2019  PT End of Session - 06/04/19 0852    Visit Number  3    Number of Visits  16    Date for PT Re-Evaluation  07/19/19    Authorization Type  CAFA    PT Start Time  0845    PT Stop Time  0928    PT Time Calculation (min)  43 min       Past Medical History:  Diagnosis Date  . Acute kidney injury ([place]) 03/02/2019  . Acute respiratory failure with hypoxia ([place]) 02/22/2019  . Atrial fibrillation with RVR ([place]) 02/26/2019  . COVID-19 02/22/2019  . Flu-like symptoms 02/22/2019  . Hypertension   . Knee osteoarthritis 10/24/2017    Past Surgical History:  Procedure Laterality Date  . NO PAST SURGERIES      There were no vitals filed for this visit.  Subjective Assessment - 06/04/19 0851    Subjective  I am feeling a little better    Patient is accompained by:  Interpreter   In person interpreter:Helene   Diagnostic tests  Dr. Marlou Sa XR: Varus alignment is present, severe on Rt.  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    Currently in Pain?  Yes    Pain Score  8     Pain Location  Knee    Pain Orientation  Right    Pain Descriptors / Indicators  Aching;Tightness    Pain Type  Chronic pain                       [facility] Adult PT Treatment/Exercise - 06/04/19 0001      Ambulation/Gait   Ambulation Distance (Feet)  202 Feet    Assistive device  Rolling walker    Ambulation Surface  Level;Indoor      Knee/Hip Exercises: Aerobic   Nustep  7 minutes L4 LE only       Knee/Hip Exercises: Seated   Long Arc Quad  Right;Left   30 reps each   Long Arc Quad Weight  2 lbs.    Ball Squeeze  x  30   also 10 LAQ with ball squeeze   Marching  --   30 reps   Marching Limitations  2lbs     Hamstring Curl  --   25 reps each   Hamstring Limitations  green band     Sit to [employer]  10 reps   without UE support      Knee/Hip Exercises: Supine   Quad Sets  15 reps    Heel Slides  20 reps    Heel Slides Limitations  rt/left     Straight Leg Raises  Both;10 reps      Knee/Hip Exercises: Sidelying   Clams  x 20 reps each side                PT Short Term Goals - 05/24/19 1033      PT SHORT TERM GOAL #1   Title  Pt will be I with initial HEP for knee AROM and strength    Time  4    Period  Weeks    Status  New  Target Date  06/21/19      PT SHORT TERM GOAL #2   Title  Pt will be able to perform SLR on Rt LE without assist for improved sit to supine    Time  4    Period  Weeks    Status  New    Target Date  06/21/19      PT SHORT TERM GOAL #3   Title  Pt will report less pain in Rt knee when sitting for improved comfort and rest at night    Time  4    Period  Weeks    Status  New    Target Date  06/21/19        PT Long Term Goals - 05/24/19 1036      PT LONG TERM GOAL #1   Title  Pt will be I with HEP for LE strength and flexibility    Time  8    Period  Weeks    Status  New    Target Date  07/19/19      PT LONG TERM GOAL #2   Title  Pt will be able to perform sit to stand x 5 in under 20 sec to demo increased functional mobility    Time  8    Period  Weeks    Status  New    Target Date  07/19/19      PT LONG TERM GOAL #3   Title  Pt will be able to walk in her home without the walker and min increase in pain from baseline.    Time  8    Period  Weeks    Status  New    Target Date  07/19/19      PT LONG TERM GOAL #4   Title  Pt will be able to walk in the community with walker (15 min ) and min increase in pain in knees    Time  8    Period  Weeks    Status  New    Target Date  07/19/19      PT LONG TERM GOAL #5   Title  Pt will be able  to increase bilateral knee strength by 1/2 muscle grade in flexion and extension.    Time  8    Period  Weeks    Status  New    Target Date  07/19/19            Plan - 06/04/19 0931    Clinical Impression Statement  Improved tolerance to gait and therex today as well as decreased pain intensity rating. Pain still rated 8/10. Improved gait distance and able to tolerate resistance for open chain therex. Worked on sit-stands and she was able to perform without use of UE.    PT Next Visit Plan  NuStep, progress ther ex as tol, continue gait in clinic    PT Home Exercise Plan  LAQ, quad set, bridge       Patient will benefit from skilled therapeutic intervention in order to improve the following deficits and impairments:  Decreased mobility, Difficulty walking, Increased edema, Obesity, Postural dysfunction, Impaired flexibility, Pain, Increased fascial restricitons, Hypomobility, Decreased strength, Decreased range of motion, Decreased endurance, Abnormal gait, Decreased activity tolerance  Visit Diagnosis: Chronic pain of left knee  Muscle weakness (generalized)  Difficulty in walking, not elsewhere classified  Chronic pain of right knee  Localized edema     Problem List Patient Active Problem List  Diagnosis Date Noted  . Acute kidney injury ([facility]) 03/02/2019  . Atrial fibrillation with RVR ([facility]) 02/26/2019  . Acute respiratory failure with hypoxia ([facility]) 02/22/2019  . COVID-19 02/22/2019  . Essential hypertension, benign 05/03/2018  . Hypertension 10/24/2017  . Knee osteoarthritis 10/24/2017    Sherrie Mustache, PTA 06/04/2019, 9:33 AM  [facility] [address] [place], [place], [address] Phone: [phone]   Fax:  [phone]  Name: [name] [name] MRN: [id number] Date of Birth: 1950-03-19

## 2019-06-05 ENCOUNTER — Encounter: Payer: Self-pay | Admitting: Pediatric Intensive Care

## 2019-06-05 DIAGNOSIS — E119 Type 2 diabetes mellitus without complications: Secondary | ICD-10-CM

## 2019-06-05 LAB — GLUCOSE, POCT (MANUAL RESULT ENTRY): POC Glucose: 58 mg/dl — AB (ref 70–99)

## 2019-06-06 NOTE — Congregational Nurse Program (Signed)
  Dept: [phone]   Congregational Nurse Program Note  Date of Encounter: 06/05/2019  Past Medical History: Past Medical History:  Diagnosis Date  . Acute kidney injury ([facility]) 03/02/2019  . Acute respiratory failure with hypoxia ([facility]) 02/22/2019  . Atrial fibrillation with RVR ([facility]) 02/26/2019  . COVID-19 02/22/2019  . Flu-like symptoms 02/22/2019  . Hypertension   . Knee osteoarthritis 10/24/2017    Encounter Details: CNP Questionnaire - 06/05/19 0930      Questionnaire   Patient Status  Immigrant    Race  African    Location Patient Served At  [employer]  Not Applicable    Uninsured  Uninsured (Subsequent visits/quarter)    Food  No food insecurities    Transportation  Yes, need transportation assistance    Interpersonal Safety  Yes, feel physically and emotionally safe where you currently live    Medication  Yes, have medication insecurities    Medical Provider  Yes    Referrals  [facility]    ED Visit Averted  Not Applicable    Life-Saving Intervention Made  Not Applicable      Visit with client for BP, BG check, medication management. [place] interpretation # [id number]. Client states that she feels fine- has not eaten since 8pm last night but took all medication this morning. Client ate orange slices after BG check. She states that she has some right knee swelling- mainly after PT sessions. CN advised ice and elevate after PT. CN will follow up with client on Wednesday 5/12 at 0900. Shann Medal RN BSN CNP [phone]

## 2019-06-07 ENCOUNTER — Ambulatory Visit: Payer: Self-pay | Admitting: Physical Therapy

## 2019-06-07 ENCOUNTER — Other Ambulatory Visit: Payer: Self-pay

## 2019-06-07 ENCOUNTER — Encounter: Payer: Self-pay | Admitting: Physical Therapy

## 2019-06-07 DIAGNOSIS — M25562 Pain in left knee: Secondary | ICD-10-CM

## 2019-06-07 DIAGNOSIS — R262 Difficulty in walking, not elsewhere classified: Secondary | ICD-10-CM

## 2019-06-07 DIAGNOSIS — M6281 Muscle weakness (generalized): Secondary | ICD-10-CM

## 2019-06-07 DIAGNOSIS — G8929 Other chronic pain: Secondary | ICD-10-CM

## 2019-06-07 DIAGNOSIS — R600 Localized edema: Secondary | ICD-10-CM

## 2019-06-07 DIAGNOSIS — M25561 Pain in right knee: Secondary | ICD-10-CM

## 2019-06-07 NOTE — Therapy (Signed)
[facility] [address] [place], [place], [address] Phone: [phone]   Fax:  [phone]  Physical Therapy Treatment  Patient Details  Name: [name] [name] MRN: [id number] Date of Birth: 10/17/1950 Referring Provider (PT): Janne Lab, NP    Encounter Date: 06/07/2019  PT End of Session - 06/07/19 0842    Visit Number  4    Number of Visits  16    Date for PT Re-Evaluation  07/19/19    Authorization Type  CAFA    PT Start Time  0832    PT Stop Time  0925    PT Time Calculation (min)  53 min    Activity Tolerance  Patient tolerated treatment well;Patient limited by pain    Behavior During Therapy  [facility] for tasks assessed/performed       Past Medical History:  Diagnosis Date  . Acute kidney injury ([facility]) 03/02/2019  . Acute respiratory failure with hypoxia ([facility]) 02/22/2019  . Atrial fibrillation with RVR ([facility]) 02/26/2019  . COVID-19 02/22/2019  . Flu-like symptoms 02/22/2019  . Hypertension   . Knee osteoarthritis 10/24/2017    Past Surgical History:  Procedure Laterality Date  . NO PAST SURGERIES      There were no vitals filed for this visit.  Subjective Assessment - 06/07/19 0835    Subjective  I have a little bit of pain in both knees.  PT is going well. I have been keeping up with my exercises. I am noticing a little change in my L knee.    Currently in Pain?  Yes    Pain Location  Knee    Pain Orientation  Right;Left    Pain Descriptors / Indicators  Discomfort    Pain Type  Chronic pain    Pain Onset  More than a month ago    Pain Frequency  Constant    Aggravating Factors   walking, standing, bending    Pain Relieving Factors  rest, sitting         [facility] Adult PT Treatment/Exercise - 06/07/19 0001      Knee/Hip Exercises: Aerobic   Nustep  5 min L 6 UE and LE       Knee/Hip Exercises: Standing   Hip Abduction  Stengthening;Both;2 sets;10 reps    Abduction Limitations  cues for alignment      Forward Step Up  Both;1 set;10 reps    Forward Step Up Limitations  pain on Rt unable     Functional Squat  1 set;10 reps      Knee/Hip Exercises: Supine   Quad Sets  15 reps    Straight Leg Raises  Both;10 reps      Knee/Hip Exercises: Sidelying   Hip ABduction  Strengthening;Both;1 set;10 reps    Clams  x 20 reps each side       Modalities   Modalities  Cryotherapy      Cryotherapy   Number Minutes Cryotherapy  8 Minutes   R   Cryotherapy Location  Knee    Type of Cryotherapy  Ice pack             PT Education - 06/07/19 0912    Education Details  added hip HEP, purpose of PT    Person(s) Educated  Patient    Methods  Explanation;Demonstration;Handout    Comprehension  Verbalized understanding;Verbal cues required;Tactile cues required       PT Short Term Goals - 05/24/19 1033  PT SHORT TERM GOAL #1   Title  Pt will be I with initial HEP for knee AROM and strength    Time  4    Period  Weeks    Status  New    Target Date  06/21/19      PT SHORT TERM GOAL #2   Title  Pt will be able to perform SLR on Rt LE without assist for improved sit to supine    Time  4    Period  Weeks    Status  New    Target Date  06/21/19      PT SHORT TERM GOAL #3   Title  Pt will report less pain in Rt knee when sitting for improved comfort and rest at night    Time  4    Period  Weeks    Status  New    Target Date  06/21/19        PT Long Term Goals - 05/24/19 1036      PT LONG TERM GOAL #1   Title  Pt will be I with HEP for LE strength and flexibility    Time  8    Period  Weeks    Status  New    Target Date  07/19/19      PT LONG TERM GOAL #2   Title  Pt will be able to perform sit to stand x 5 in under 20 sec to demo increased functional mobility    Time  8    Period  Weeks    Status  New    Target Date  07/19/19      PT LONG TERM GOAL #3   Title  Pt will be able to walk in her home without the walker and min increase in pain from baseline.    Time   8    Period  Weeks    Status  New    Target Date  07/19/19      PT LONG TERM GOAL #4   Title  Pt will be able to walk in the community with walker (15 min ) and min increase in pain in knees    Time  8    Period  Weeks    Status  New    Target Date  07/19/19      PT LONG TERM GOAL #5   Title  Pt will be able to increase bilateral knee strength by 1/2 muscle grade in flexion and extension.    Time  8    Period  Weeks    Status  New    Target Date  07/19/19            Plan - 06/07/19 0842    Clinical Impression Statement  Closed chain exercises increase her pain especially in Rt knee.  Unable to do step ups on Rt LE.  She was introduced to new hip HEP.  She needs cues to activate quads, especially on the Rt side.    PT Treatment/Interventions  ADLs/Self Care Home Management;Electrical Stimulation;Cryotherapy;Iontophoresis 4mg /ml Dexamethasone;Moist Heat;Ultrasound;Gait training;Functional mobility training;Therapeutic activities;Neuromuscular re-education;Therapeutic exercise;Passive range of motion;Taping;Manual techniques;Patient/family education;DME Instruction;Balance training    PT Next Visit Plan  NuStep, progress ther ex as tol, continue gait in clinic. Goal check, ice post ?    PT Home Exercise Plan  LAQ, quad set, bridge, added 5/7 clam, SLR    Consulted and Agree with Plan of Care  Patient  Patient will benefit from skilled therapeutic intervention in order to improve the following deficits and impairments:  Decreased mobility, Difficulty walking, Increased edema, Obesity, Postural dysfunction, Impaired flexibility, Pain, Increased fascial restricitons, Hypomobility, Decreased strength, Decreased range of motion, Decreased endurance, Abnormal gait, Decreased activity tolerance  Visit Diagnosis: Chronic pain of left knee  Muscle weakness (generalized)  Difficulty in walking, not elsewhere classified  Chronic pain of right knee  Localized  edema     Problem List Patient Active Problem List   Diagnosis Date Noted  . Acute kidney injury (HCC) 03/02/2019  . Atrial fibrillation with RVR (HCC) 02/26/2019  . Acute respiratory failure with hypoxia (H[facility]) 02/22/2019  . COVID-19 02/22/2019  . Essential hypertension, benign 05/03/2018  . Hypertension 10/24/2017  . Knee osteoarthritis 10/24/2017    Brinda Focht 06/07/2019, 9:20 AM  [facility] [address] [place], [place], [address] Phone: [phone]   Fax:  [phone]  Name: Ndea Kilroy MRN: [id number] Date of Birth: [birth year]  Karie Mainland, PT 06/07/19 9:20 AM Phone: [phone] Fax: [phone]

## 2019-06-07 NOTE — Patient Instructions (Signed)
    Karie Mainland, PT 06/07/19 9:09 AM Phone: [phone] Fax: [phone] Abduction: Clam (Eccentric) - Side-Lying    Lie on side with knees bent. Lift top knee, keeping feet together. Keep trunk steady. Slowly lower for 3-5 seconds. _20__ reps per set, __1-2_ sets per day, __5_ days per week. Add ___ lbs when you achieve ___ repetitions.  http://ecce.exer.us/64   Copyright  VHI. All rights reserved.  Hip Abduction: Modified    [employer] ct droit avec un oreiller entre les cuisses, lever la jambe suprieure de Editor, commissioning, en la pivotant lgrement vers l'extrieur. Rpter ___10-20_ fois par srie. Faire ___2_ sries par sance. Faire __2__ sances par jour.  http://orth.exer.us/705   Copyright  VHI. All rights reserved.  Straight Leg Raise    Flchir une jambe. Lever l'autre jambe  _15___ cm avec le genou raide. Expirer et serrer les muscles de la cuisse en levant la [place]. Rpter [employer]. Rpter __10 x 2__ fois. Faire __2__ sances par jour.  http://gt2.exer.us/269   Copyright  VHI. All rights reserved.

## 2019-06-11 ENCOUNTER — Other Ambulatory Visit: Payer: Self-pay

## 2019-06-11 ENCOUNTER — Ambulatory Visit: Payer: Self-pay | Admitting: Physical Therapy

## 2019-06-11 ENCOUNTER — Encounter: Payer: Self-pay | Admitting: Physical Therapy

## 2019-06-11 DIAGNOSIS — M25562 Pain in left knee: Secondary | ICD-10-CM

## 2019-06-11 DIAGNOSIS — M6281 Muscle weakness (generalized): Secondary | ICD-10-CM

## 2019-06-11 DIAGNOSIS — M25561 Pain in right knee: Secondary | ICD-10-CM

## 2019-06-11 DIAGNOSIS — G8929 Other chronic pain: Secondary | ICD-10-CM

## 2019-06-11 DIAGNOSIS — R600 Localized edema: Secondary | ICD-10-CM

## 2019-06-11 DIAGNOSIS — R262 Difficulty in walking, not elsewhere classified: Secondary | ICD-10-CM

## 2019-06-11 NOTE — Therapy (Signed)
[facility] [address] [place], [place], [address] Phone: [phone]   Fax:  [phone]  Physical Therapy Treatment  Patient Details  Name: [name] [name] MRN: [id number] Date of Birth: 13-Feb-1950 Referring Provider (PT): Janne Lab, NP    Encounter Date: 06/11/2019  PT End of Session - 06/11/19 0923    Visit Number  5    Number of Visits  16    Date for PT Re-Evaluation  07/19/19    Authorization Type  CAFA    PT Start Time  0918    PT Stop Time  1000    PT Time Calculation (min)  42 min    Activity Tolerance  Patient tolerated treatment well;Patient limited by pain    Behavior During Therapy  [facility] for tasks assessed/performed       Past Medical History:  Diagnosis Date  . Acute kidney injury (HCC) 03/02/2019  . Acute respiratory failure with hypoxia (HCC) 02/22/2019  . Atrial fibrillation with RVR (HCC) 02/26/2019  . COVID-19 02/22/2019  . Flu-like symptoms 02/22/2019  . Hypertension   . Knee osteoarthritis 10/24/2017    Past Surgical History:  Procedure Laterality Date  . NO PAST SURGERIES      There were no vitals filed for this visit.  Subjective Assessment - 06/11/19 0921    Subjective  Pain is min today , Rt knee > L.    Currently in Pain?  Yes    Pain Score  6     Pain Location  Knee    Pain Orientation  Right;Left    Pain Descriptors / Indicators  Sore    Pain Type  Chronic pain    Pain Onset  More than a month ago         [facility] Adult PT Treatment/Exercise - 06/11/19 0001      Ambulation/Gait   Gait Comments  3:35 for 217 feet       Knee/Hip Exercises: Aerobic   Nustep  6 min L 6 UE and LE       Knee/Hip Exercises: Standing   Heel Raises  Both;1 set;20 reps    Heel Raises Limitations  holding walker     Hip Abduction  Stengthening;Both;2 sets;10 reps    Abduction Limitations  cues for alignment     Forward Step Up  Both;1 set;10 reps    Functional Squat  2 sets;10 reps    Functional Squat Limitations  with and without walker       Knee/Hip Exercises: Seated   Long Arc Quad  Strengthening;Both;1 set;20 reps;Weights    Long Arc Quad Weight  3 lbs.    Marching  Strengthening;Both;1 set;10 reps    Marching Weights  3 lbs.    Sit to Sand  1 set;10 reps;without UE support      Knee/Hip Exercises: Supine   Bridges  Strengthening;2 sets;10 reps    Straight Leg Raises  Both;2 sets;10 reps      Knee/Hip Exercises: Sidelying   Clams  2 x 10  reps each side       Cryotherapy   Number Minutes Cryotherapy  10 Minutes    Cryotherapy Location  Knee    Type of Cryotherapy  Ice pack               PT Short Term Goals - 05/24/19 1033      PT SHORT TERM GOAL #1   Title  Pt will be I with initial HEP for knee  AROM and strength    Time  4    Period  Weeks    Status  New    Target Date  06/21/19      PT SHORT TERM GOAL #2   Title  Pt will be able to perform SLR on Rt LE without assist for improved sit to supine    Time  4    Period  Weeks    Status  New    Target Date  06/21/19      PT SHORT TERM GOAL #3   Title  Pt will report less pain in Rt knee when sitting for improved comfort and rest at night    Time  4    Period  Weeks    Status  New    Target Date  06/21/19        PT Long Term Goals - 05/24/19 1036      PT LONG TERM GOAL #1   Title  Pt will be I with HEP for LE strength and flexibility    Time  8    Period  Weeks    Status  New    Target Date  07/19/19      PT LONG TERM GOAL #2   Title  Pt will be able to perform sit to stand x 5 in under 20 sec to demo increased functional mobility    Time  8    Period  Weeks    Status  New    Target Date  07/19/19      PT LONG TERM GOAL #3   Title  Pt will be able to walk in her home without the walker and min increase in pain from baseline.    Time  8    Period  Weeks    Status  New    Target Date  07/19/19      PT LONG TERM GOAL #4   Title  Pt will be able to walk in the community  with walker (15 min ) and min increase in pain in knees    Time  8    Period  Weeks    Status  New    Target Date  07/19/19      PT LONG TERM GOAL #5   Title  Pt will be able to increase bilateral knee strength by 1/2 muscle grade in flexion and extension.    Time  8    Period  Weeks    Status  New    Target Date  07/19/19            Plan - 06/11/19 1341    Clinical Impression Statement  Patient able to work during session with min increase in Rt knee pain .  Asked to perform her exercises more slowly and to add another set. She is vry concerned about the shape of her Rt knee (bowing out) and I reinforced that no amt of PT will change this.    PT Treatment/Interventions  ADLs/Self Care Home Management;Electrical Stimulation;Cryotherapy;Iontophoresis 4mg /ml Dexamethasone;Moist Heat;Ultrasound;Gait training;Functional mobility training;Therapeutic activities;Neuromuscular re-education;Therapeutic exercise;Passive range of motion;Taping;Manual techniques;Patient/family education;DME Instruction;Balance training    PT Next Visit Plan  NuStep, progress ther ex as tol, continue gait in clinic. Goal check, ice post ?    PT Home Exercise Plan  LAQ, quad set, bridge, added 5/7 clam, SLR    Consulted and Agree with Plan of Care  Patient       Patient will benefit from skilled  therapeutic intervention in order to improve the following deficits and impairments:  Decreased mobility, Difficulty walking, Increased edema, Obesity, Postural dysfunction, Impaired flexibility, Pain, Increased fascial restricitons, Hypomobility, Decreased strength, Decreased range of motion, Decreased endurance, Abnormal gait, Decreased activity tolerance  Visit Diagnosis: Chronic pain of left knee  Muscle weakness (generalized)  Difficulty in walking, not elsewhere classified  Chronic pain of right knee  Localized edema     Problem List Patient Active Problem List   Diagnosis Date Noted  . Acute kidney  injury (HCC) 03/02/2019  . Atrial fibrillation with RVR (HCC) 02/26/2019  . Acute respiratory failure with hypoxia (HCC) 02/22/2019  . COVID-19 02/22/2019  . Essential hypertension, benign 05/03/2018  . Hypertension 10/24/2017  . Knee osteoarthritis 10/24/2017    Milli Woolridge 06/11/2019, 1:43 PM  [facility] [address] [place], [place], [address] Phone: [phone]   Fax:  [phone]  Name: Kara Melching MRN: [id number] Date of Birth: 1950-10-26  Karie Mainland, PT 06/11/19 1:43 PM Phone: [phone] Fax: [phone]

## 2019-06-12 ENCOUNTER — Encounter: Payer: Self-pay | Admitting: Pediatric Intensive Care

## 2019-06-14 ENCOUNTER — Encounter: Payer: Self-pay | Admitting: Physical Therapy

## 2019-06-14 ENCOUNTER — Ambulatory Visit: Payer: Self-pay | Admitting: Physical Therapy

## 2019-06-14 ENCOUNTER — Other Ambulatory Visit: Payer: Self-pay

## 2019-06-14 DIAGNOSIS — R262 Difficulty in walking, not elsewhere classified: Secondary | ICD-10-CM

## 2019-06-14 DIAGNOSIS — M6281 Muscle weakness (generalized): Secondary | ICD-10-CM

## 2019-06-14 DIAGNOSIS — M25562 Pain in left knee: Secondary | ICD-10-CM

## 2019-06-14 DIAGNOSIS — G8929 Other chronic pain: Secondary | ICD-10-CM

## 2019-06-14 DIAGNOSIS — R600 Localized edema: Secondary | ICD-10-CM

## 2019-06-14 NOTE — Therapy (Signed)
[place], [place], [address] Phone: [phone]   Fax:  [phone]  Physical Therapy Treatment  Patient Details  Name: [name] [name] MRN: [id number] Date of Birth: 03-Jan-1951 Referring Provider (PT): Jens Som, NP    Encounter Date: 06/14/2019  PT End of Session - 06/14/19 0843    Visit Number  6    Number of Visits  16    Date for PT Re-Evaluation  07/19/19    Authorization Type  CAFA    PT Start Time  0833    PT Stop Time  0917    PT Time Calculation (min)  44 min    Activity Tolerance  Patient tolerated treatment well    Behavior During Therapy  [facility] for tasks assessed/performed       Past Medical History:  Diagnosis Date  . Acute kidney injury ([place]) 03/02/2019  . Acute respiratory failure with hypoxia ([place]) 02/22/2019  . Atrial fibrillation with RVR ([place]) 02/26/2019  . COVID-19 02/22/2019  . Flu-like symptoms 02/22/2019  . Hypertension   . Knee osteoarthritis 10/24/2017    Past Surgical History:  Procedure Laterality Date  . NO PAST SURGERIES      There were no vitals filed for this visit.  Subjective Assessment - 06/14/19 0842    Subjective  Min pain this morning.    Currently in Pain?  Yes    Pain Score  4     Pain Location  Knee         [facility] PT Assessment - 06/14/19 0001      AROM   Right Knee Flexion  110    Left Knee Flexion  118      Strength   Right Knee Flexion  4/5    Right Knee Extension  4-/5    Left Knee Flexion  4/5    Left Knee Extension  4/5        [facility] Adult PT Treatment/Exercise - 06/14/19 0001      Knee/Hip Exercises: Stretches   Knee: Self-Stretch to increase Flexion  Both;5 reps    Knee: Self-Stretch Limitations  sheet AAROM       Knee/Hip Exercises: Aerobic   Nustep  6 min L 6 UE and LE       Knee/Hip Exercises: Seated   Long Arc Quad  Strengthening;Both;1 set;20 reps;Weights    Long Arc Quad Weight  5 lbs.    Hamstring Curl   Strengthening;Both;1 set;15 reps    Hamstring Limitations  red     Sit to Sand  1 set;10 reps;without UE support   red band x 10 good control      Knee/Hip Exercises: Supine   Quad Sets  Strengthening;Both;1 set;20 reps    Short Arc Quad Sets  Strengthening;Both;1 set;20 reps    Short Arc [employer] Limitations  5 lbs     Bridges  Strengthening;2 sets;10 reps    Bridges Limitations  on heels     Straight Leg Raises  Both;2 sets;10 reps               PT Short Term Goals - 06/14/19 0848      PT SHORT TERM GOAL #1   Title  Pt will be I with initial HEP for knee AROM and strength    Status  Achieved      PT SHORT TERM GOAL #2   Title  Pt will be able to perform SLR on Rt LE without assist  for improved sit to supine    Status  Achieved      PT SHORT TERM GOAL #3   Title  Pt will report less pain in Rt knee when sitting for improved comfort and rest at night    Status  Achieved        PT Long Term Goals - 05/24/19 1036      PT LONG TERM GOAL #1   Title  Pt will be I with HEP for LE strength and flexibility    Time  8    Period  Weeks    Status  New    Target Date  07/19/19      PT LONG TERM GOAL #2   Title  Pt will be able to perform sit to stand x 5 in under 20 sec to demo increased functional mobility    Time  8    Period  Weeks    Status  New    Target Date  07/19/19      PT LONG TERM GOAL #3   Title  Pt will be able to walk in her home without the walker and min increase in pain from baseline.    Time  8    Period  Weeks    Status  New    Target Date  07/19/19      PT LONG TERM GOAL #4   Title  Pt will be able to walk in the community with walker (15 min ) and min increase in pain in knees    Time  8    Period  Weeks    Status  New    Target Date  07/19/19      PT LONG TERM GOAL #5   Title  Pt will be able to increase bilateral knee strength by 1/2 muscle grade in flexion and extension.    Time  8    Period  Weeks    Status  New    Target Date   07/19/19            Plan - 06/14/19 0849    Clinical Impression Statement  Patient feels that she is improving and feels she can rest better, less pain in AM hours.  She has increased her AROM, cont to lack quad strength. Pushes through pain during exercises.    PT Treatment/Interventions  ADLs/Self Care Home Management;Electrical Stimulation;Cryotherapy;Iontophoresis 4mg /ml Dexamethasone;Moist Heat;Ultrasound;Gait training;Functional mobility training;Therapeutic activities;Neuromuscular re-education;Therapeutic exercise;Passive range of motion;Taping;Manual techniques;Patient/family education;DME Instruction;Balance training    PT Next Visit Plan  NuStep, progress ther ex as tol, continue gait in clinic. ice post ?    PT Home Exercise Plan  LAQ, quad set, bridge, added 5/7 clam, SLR    Consulted and Agree with Plan of Care  Patient       Patient will benefit from skilled therapeutic intervention in order to improve the following deficits and impairments:  Decreased mobility, Difficulty walking, Increased edema, Obesity, Postural dysfunction, Impaired flexibility, Pain, Increased fascial restricitons, Hypomobility, Decreased strength, Decreased range of motion, Decreased endurance, Abnormal gait, Decreased activity tolerance  Visit Diagnosis: Chronic pain of left knee  Muscle weakness (generalized)  Difficulty in walking, not elsewhere classified  Chronic pain of right knee  Localized edema     Problem List Patient Active Problem List   Diagnosis Date Noted  . Acute kidney injury (HCC) 03/02/2019  . Atrial fibrillation with RVR (HCC) 02/26/2019  . Acute respiratory failure with hypoxia (HCC) 02/22/2019  . COVID-19 02/22/2019  .  Essential hypertension, benign 05/03/2018  . Hypertension 10/24/2017  . Knee osteoarthritis 10/24/2017    Kristopher Delk 06/14/2019, 9:24 AM  [facility] [address] [place], [place],  [address] Phone: [phone]   Fax:  [phone]  Name: [name] [name] MRN: [id number] Date of Birth: 02-20-1950  Karie Mainland, PT 06/14/19 9:24 AM Phone: [phone] Fax: [phone]

## 2019-06-18 ENCOUNTER — Ambulatory Visit: Payer: Self-pay | Admitting: Physical Therapy

## 2019-06-18 ENCOUNTER — Encounter: Payer: Self-pay | Admitting: Physical Therapy

## 2019-06-18 ENCOUNTER — Other Ambulatory Visit: Payer: Self-pay

## 2019-06-18 ENCOUNTER — Encounter: Payer: Self-pay | Admitting: Pediatric Intensive Care

## 2019-06-18 DIAGNOSIS — M6281 Muscle weakness (generalized): Secondary | ICD-10-CM

## 2019-06-18 DIAGNOSIS — R600 Localized edema: Secondary | ICD-10-CM

## 2019-06-18 DIAGNOSIS — G8929 Other chronic pain: Secondary | ICD-10-CM

## 2019-06-18 DIAGNOSIS — R262 Difficulty in walking, not elsewhere classified: Secondary | ICD-10-CM

## 2019-06-18 DIAGNOSIS — M25561 Pain in right knee: Secondary | ICD-10-CM

## 2019-06-18 NOTE — Therapy (Signed)
[place], [place], [address] Phone: [phone]   Fax:  [phone]  Physical Therapy Treatment  Patient Details  Name: [name] [name] MRN: [id number] Date of Birth: 05/10/1950 Referring Provider (PT): Jens Som, NP    Encounter Date: 06/18/2019  PT End of Session - 06/18/19 2706    Visit Number  7    Number of Visits  16    Date for PT Re-Evaluation  07/19/19    Authorization Type  CAFA    PT Start Time  0830    PT Stop Time  0925    PT Time Calculation (min)  55 min    Activity Tolerance  Patient tolerated treatment well;Patient limited by pain    Behavior During Therapy  [facility] for tasks assessed/performed       Past Medical History:  Diagnosis Date  . Acute kidney injury ([place]) 03/02/2019  . Acute respiratory failure with hypoxia ([place]) 02/22/2019  . Atrial fibrillation with RVR ([place]) 02/26/2019  . COVID-19 02/22/2019  . Flu-like symptoms 02/22/2019  . Hypertension   . Knee osteoarthritis 10/24/2017    Past Surgical History:  Procedure Laterality Date  . NO PAST SURGERIES      There were no vitals filed for this visit.  Subjective Assessment - 06/18/19 0835    Subjective  I have increased pain at home. Pt does her exercises and then some other exercises, too.  (You Tube videos) . Her Rt ankle was swollen after last session, pain ful.    Patient is accompained by:  Interpreter    Currently in Pain?  No/denies   Good today        [facility] Adult PT Treatment/Exercise - 06/18/19 0001      Self-Care   Self-Care  Retrograde Massage;Heat/Ice Application;Other Self-Care Comments    Retrograde Massage  soft tissue work, edema , RICE     Other Self-Care Comments   see education       Knee/Hip Exercises: Aerobic   Nustep  6 min L 6  LE only        Knee/Hip Exercises: Standing   Hip Flexion  Stengthening;Both;1 set;10 reps    Hip Flexion Limitations  march high knees in parallel bars     Hip Abduction  Stengthening;Both;2 sets;10 reps    Abduction Limitations  pain WB on Rt LE     Functional Squat  1 set;10 reps    Functional Squat Limitations  cues needed       Knee/Hip Exercises: Seated   Sit to Sand  1 set;15 reps;with UE support      Knee/Hip Exercises: Supine   Straight Leg Raises  Both;1 set;10 reps      Modalities   Modalities  Cryotherapy      Cryotherapy   Number Minutes Cryotherapy  8 Minutes    Cryotherapy Location  Knee    Type of Cryotherapy  Ice pack      Manual Therapy   Manual Therapy  Soft tissue mobilization;Other (comment)    Soft tissue mobilization  Quadriceps, ITB Rt > Lt. LE     Other Manual Therapy  patellar mobilization              PT Education - 06/18/19 1040    Education Details  knee OA, causes of, reduce frequency of exercise to 2 times.    Person(s) Educated  Patient    Methods  Explanation;Demonstration    Comprehension  Verbalized understanding;Returned demonstration  PT Short Term Goals - 06/14/19 0848      PT SHORT TERM GOAL #1   Title  Pt will be I with initial HEP for knee AROM and strength    Status  Achieved      PT SHORT TERM GOAL #2   Title  Pt will be able to perform SLR on Rt LE without assist for improved sit to supine    Status  Achieved      PT SHORT TERM GOAL #3   Title  Pt will report less pain in Rt knee when sitting for improved comfort and rest at night    Status  Achieved        PT Long Term Goals - 05/24/19 1036      PT LONG TERM GOAL #1   Title  Pt will be I with HEP for LE strength and flexibility    Time  8    Period  Weeks    Status  New    Target Date  07/19/19      PT LONG TERM GOAL #2   Title  Pt will be able to perform sit to stand x 5 in under 20 sec to demo increased functional mobility    Time  8    Period  Weeks    Status  New    Target Date  07/19/19      PT LONG TERM GOAL #3   Title  Pt will be able to walk in her home without the walker and min increase  in pain from baseline.    Time  8    Period  Weeks    Status  New    Target Date  07/19/19      PT LONG TERM GOAL #4   Title  Pt will be able to walk in the community with walker (15 min ) and min increase in pain in knees    Time  8    Period  Weeks    Status  New    Target Date  07/19/19      PT LONG TERM GOAL #5   Title  Pt will be able to increase bilateral knee strength by 1/2 muscle grade in flexion and extension.    Time  8    Period  Weeks    Status  New    Target Date  07/19/19            Plan - 06/18/19 0843    Clinical Impression Statement  She had an injection(s) in her R knee in 2018 at home with min benefit overall.  She performed sit to stand x 10 in 16 sec but used UE for min assist. Manual therapy much appreciated by patient for pain relief and soft tissue mobilization, edema.    PT Treatment/Interventions  ADLs/Self Care Home Management;Electrical Stimulation;Cryotherapy;Iontophoresis 4mg /ml Dexamethasone;Moist Heat;Ultrasound;Gait training;Functional mobility training;Therapeutic activities;Neuromuscular re-education;Therapeutic exercise;Passive range of motion;Taping;Manual techniques;Patient/family education;DME Instruction;Balance training    PT Next Visit Plan  standing as tolerated , LE strength , parallel bars for support, Manual    PT Home Exercise Plan  LAQ, quad set, bridge, added 5/7 clam, SLR    Consulted and Agree with Plan of Care  Patient       Patient will benefit from skilled therapeutic intervention in order to improve the following deficits and impairments:  Decreased mobility, Difficulty walking, Increased edema, Obesity, Postural dysfunction, Impaired flexibility, Pain, Increased fascial restricitons, Hypomobility, Decreased strength, Decreased range of motion,  Decreased endurance, Abnormal gait, Decreased activity tolerance  Visit Diagnosis: Chronic pain of left knee  Muscle weakness (generalized)  Difficulty in walking, not elsewhere  classified  Chronic pain of right knee  Localized edema     Problem List Patient Active Problem List   Diagnosis Date Noted  . Acute kidney injury (HCC) 03/02/2019  . Atrial fibrillation with RVR (HCC) 02/26/2019  . Acute respiratory failure with hypoxia (HCC) 02/22/2019  . COVID-19 02/22/2019  . Essential hypertension, benign 05/03/2018  . Hypertension 10/24/2017  . Knee osteoarthritis 10/24/2017    [name] [name] 06/18/2019, 10:45 AM  [facility] [address] [place], [place], [address] Phone: [phone]   Fax:  [phone]  Name: [name] [name] MRN: [id number] Date of Birth: 1950-09-14  Karie Mainland, PT 06/18/19 10:46 AM Phone: [phone] Fax: [phone]

## 2019-06-21 ENCOUNTER — Encounter: Payer: Self-pay | Admitting: Physical Therapy

## 2019-06-21 ENCOUNTER — Ambulatory Visit: Payer: Self-pay | Admitting: Physical Therapy

## 2019-06-21 ENCOUNTER — Other Ambulatory Visit: Payer: Self-pay

## 2019-06-21 DIAGNOSIS — G8929 Other chronic pain: Secondary | ICD-10-CM

## 2019-06-21 DIAGNOSIS — R600 Localized edema: Secondary | ICD-10-CM

## 2019-06-21 DIAGNOSIS — M6281 Muscle weakness (generalized): Secondary | ICD-10-CM

## 2019-06-21 DIAGNOSIS — R262 Difficulty in walking, not elsewhere classified: Secondary | ICD-10-CM

## 2019-06-21 NOTE — Therapy (Signed)
[place], [place], [address] Phone: [phone]   Fax:  [phone]  Physical Therapy Treatment  Patient Details  Name: [name] [name] MRN: [id number] Date of Birth: 1950/03/01 Referring Provider (PT): Jens Som, NP    Encounter Date: 06/21/2019  PT End of Session - 06/21/19 0847    Visit Number  8    Number of Visits  16    Date for PT Re-Evaluation  07/19/19    Authorization Type  CAFA    PT Start Time  0835    PT Stop Time  0922    PT Time Calculation (min)  47 min       Past Medical History:  Diagnosis Date  . Acute kidney injury ([place]) 03/02/2019  . Acute respiratory failure with hypoxia ([place]) 02/22/2019  . Atrial fibrillation with RVR ([place]) 02/26/2019  . COVID-19 02/22/2019  . Flu-like symptoms 02/22/2019  . Hypertension   . Knee osteoarthritis 10/24/2017    Past Surgical History:  Procedure Laterality Date  . NO PAST SURGERIES      There were no vitals filed for this visit.  Subjective Assessment - 06/21/19 0845    Subjective  The pain is much less today.  Rt knee is "so-so".         [facility] Adult PT Treatment/Exercise - 06/21/19 0001      Knee/Hip Exercises: Stretches   Active Hamstring Stretch  Right;2 reps;30 seconds    Knee: Self-Stretch to increase Flexion  Both;5 reps    Knee: Self-Stretch Limitations  sheet AAROM     ITB Stretch  Right;2 reps    ITB Stretch Limitations         Knee/Hip Exercises: Standing   Heel Raises  Both;1 set;20 reps    Heel Raises Limitations  holding walker       Knee/Hip Exercises: Supine   Bridges  Strengthening;Both;1 set;15 reps    Bridges Limitations  with ball     Straight Leg Raises  Both;2 sets;10 reps    Straight Leg Raises Limitations  did 1 set x 1       Knee/Hip Exercises: Sidelying   Hip ABduction  Strengthening;Both;1 set;15 reps      Manual Therapy   Soft tissue mobilization  Quadriceps, ITB Rt > Lt. LE     Other  Manual Therapy  patellar mobilization                PT Short Term Goals - 06/14/19 0848      PT SHORT TERM GOAL #1   Title  Pt will be I with initial HEP for knee AROM and strength    Status  Achieved      PT SHORT TERM GOAL #2   Title  Pt will be able to perform SLR on Rt LE without assist for improved sit to supine    Status  Achieved      PT SHORT TERM GOAL #3   Title  Pt will report less pain in Rt knee when sitting for improved comfort and rest at night    Status  Achieved        PT Long Term Goals - 05/24/19 1036      PT LONG TERM GOAL #1   Title  Pt will be I with HEP for LE strength and flexibility    Time  8    Period  Weeks    Status  New    Target Date  07/19/19      PT LONG TERM GOAL #2   Title  Pt will be able to perform sit to stand x 5 in under 20 sec to demo increased functional mobility    Time  8    Period  Weeks    Status  New    Target Date  07/19/19      PT LONG TERM GOAL #3   Title  Pt will be able to walk in her home without the walker and min increase in pain from baseline.    Time  8    Period  Weeks    Status  New    Target Date  07/19/19      PT LONG TERM GOAL #4   Title  Pt will be able to walk in the community with walker (15 min ) and min increase in pain in knees    Time  8    Period  Weeks    Status  New    Target Date  07/19/19      PT LONG TERM GOAL #5   Title  Pt will be able to increase bilateral knee strength by 1/2 muscle grade in flexion and extension.    Time  8    Period  Weeks    Status  New    Target Date  07/19/19            Plan - 06/21/19 1911    Clinical Impression Statement  No in person interpreter, used AMN.  Performed supine exercises to prevent increase in pain.  Offered more stretching to hamstring and ITB today. Weakness in lateral hip mm evident.  Explained again that her pain will likley not be completely resolved but her function should contiue to improve.    PT Treatment/Interventions   ADLs/Self Care Home Management;Electrical Stimulation;Cryotherapy;Iontophoresis 4mg /ml Dexamethasone;Moist Heat;Ultrasound;Gait training;Functional mobility training;Therapeutic activities;Neuromuscular re-education;Therapeutic exercise;Passive range of motion;Taping;Manual techniques;Patient/family education;DME Instruction;Balance training    PT Next Visit Plan  standing as tolerated , LE strength , parallel bars for support, Manual    PT Home Exercise Plan  LAQ, quad set, bridge, added 5/7 clam, SLR    Consulted and Agree with Plan of Care  Patient       Patient will benefit from skilled therapeutic intervention in order to improve the following deficits and impairments:  Decreased mobility, Difficulty walking, Increased edema, Obesity, Postural dysfunction, Impaired flexibility, Pain, Increased fascial restricitons, Hypomobility, Decreased strength, Decreased range of motion, Decreased endurance, Abnormal gait, Decreased activity tolerance  Visit Diagnosis: Chronic pain of left knee  Muscle weakness (generalized)  Localized edema  Difficulty in walking, not elsewhere classified  Chronic pain of right knee     Problem List Patient Active Problem List   Diagnosis Date Noted  . Acute kidney injury ([facility]) 03/02/2019  . Atrial fibrillation with RVR ([facility]) 02/26/2019  . Acute respiratory failure with hypoxia ([facility]) 02/22/2019  . COVID-19 02/22/2019  . Essential hypertension, benign 05/03/2018  . Hypertension 10/24/2017  . Knee osteoarthritis 10/24/2017    Aleah Ahlgrim 06/21/2019, 7:15 PM  [facility] [address] [place], [place], [place] Phone: [phone]   Fax:  [phone]  Name: Kellie Murrill MRN: [name] Date of Birth: May 12, 1950   01/27/1951, PT 06/21/19 7:15 PM Phone: [phone] Fax: [phone]

## 2019-06-25 ENCOUNTER — Ambulatory Visit: Payer: Self-pay | Admitting: Physical Therapy

## 2019-06-25 ENCOUNTER — Other Ambulatory Visit: Payer: Self-pay

## 2019-06-25 ENCOUNTER — Encounter: Payer: Self-pay | Admitting: Physical Therapy

## 2019-06-25 DIAGNOSIS — M6281 Muscle weakness (generalized): Secondary | ICD-10-CM

## 2019-06-25 DIAGNOSIS — G8929 Other chronic pain: Secondary | ICD-10-CM

## 2019-06-25 DIAGNOSIS — R262 Difficulty in walking, not elsewhere classified: Secondary | ICD-10-CM

## 2019-06-25 DIAGNOSIS — R600 Localized edema: Secondary | ICD-10-CM

## 2019-06-25 DIAGNOSIS — M25562 Pain in left knee: Secondary | ICD-10-CM

## 2019-06-25 NOTE — Therapy (Signed)
[place], [place], [address] Phone: [phone]   Fax:  [phone]  Physical Therapy Treatment  Patient Details  Name: [name] [name] MRN: [id number] Date of Birth: August 15, 1950 Referring Provider (PT): Jens Som, NP    Encounter Date: 06/25/2019  PT End of Session - 06/25/19 0840    Visit Number  9    Number of Visits  16    Date for PT Re-Evaluation  07/19/19    Authorization Type  CAFA    PT Start Time  0835    PT Stop Time  0925    PT Time Calculation (min)  50 min    Activity Tolerance  Patient tolerated treatment well    Behavior During Therapy  [facility] for tasks assessed/performed       Past Medical History:  Diagnosis Date  . Acute kidney injury ([place]) 03/02/2019  . Acute respiratory failure with hypoxia ([place]) 02/22/2019  . Atrial fibrillation with RVR ([place]) 02/26/2019  . COVID-19 02/22/2019  . Flu-like symptoms 02/22/2019  . Hypertension   . Knee osteoarthritis 10/24/2017    Past Surgical History:  Procedure Laterality Date  . NO PAST SURGERIES      There were no vitals filed for this visit.  Subjective Assessment - 06/25/19 0839    Subjective  I feel pretty good today.  R knee is getting better.  Has a cane (describes as a Lofstrand?) at home.  May bring it in last time.    Currently in Pain?  No/denies          [facility] Adult PT Treatment/Exercise - 06/25/19 0001      Knee/Hip Exercises: Standing   Heel Raises  Both;1 set;20 reps    Heel Raises Limitations  5 ;lbs cuff wgt    Hip Flexion  Stengthening;Both;1 set;10 reps;Knee bent    Hip Flexion Limitations  5 lbs     Step Down Limitations  step taps 4 inch step with cane and supervision       Knee/Hip Exercises: Seated   Long Arc Quad  Strengthening;Both;2 sets;15 reps;Weights    Long Arc Quad Weight  5 lbs.    Long Arc [employer] Limitations  ball 2nd Research scientist (life sciences)  Strengthening;Both;1 set;15 reps;Weights    [employer]  5 lbs.    Sit to [employer]  2 sets;10 reps      Knee/Hip Exercises: Supine   Short Arc Quad Sets Limitations  ball hold with alt knee ext x 10 each     Bridges  Strengthening;Both;1 set;15 reps    Bridges Limitations  with ball       Knee/Hip Exercises: Sidelying   Hip ABduction  Strengthening;Both;1 set;15 reps    Clams  x 20     Other Sidelying Knee/Hip Exercises  circles x 10              PT Education - 06/25/19 0956    Education Details  gait with cane    Person(s) Educated  Patient    Methods  Explanation    Comprehension  Verbalized understanding       PT Short Term Goals - 06/14/19 0848      PT SHORT TERM GOAL #1   Title  Pt will be I with initial HEP for knee AROM and strength    Status  Achieved      PT SHORT TERM GOAL #2   Title  Pt will be able to  perform SLR on Rt LE without assist for improved sit to supine    Status  Achieved      PT SHORT TERM GOAL #3   Title  Pt will report less pain in Rt knee when sitting for improved comfort and rest at night    Status  Achieved        PT Long Term Goals - 06/25/19 0959      PT LONG TERM GOAL #1   Title  Pt will be I with HEP for LE strength and flexibility    Status  On-going      PT LONG TERM GOAL #2   Title  Pt will be able to perform sit to stand x 5 in under 20 sec to demo increased functional mobility    Status  On-going      PT LONG TERM GOAL #3   Title  Pt will be able to walk in her home without the walker and min increase in pain from baseline.    Baseline  uses cane, unable to rate pain    Status  On-going      PT LONG TERM GOAL #4   Title  Pt will be able to walk in the community with walker (15 min ) and min increase in pain in knees    Status  On-going      PT LONG TERM GOAL #5   Title  Pt will be able to increase bilateral knee strength by 1/2 muscle grade in flexion and extension.    Status  Unable to assess            Plan - 06/25/19 1015    Clinical Impression  Statement  Less discomfort in Rt knee as session progressed. She walked with cane with supervision, cues for posture and sequencing.  She plans to  bring in her "cane" at home to practice.  What she describes sounds like a Lofstrand crutch.  Tolerating exercises well.    PT Treatment/Interventions  ADLs/Self Care Home Management;Electrical Stimulation;Cryotherapy;Iontophoresis 4mg /ml Dexamethasone;Moist Heat;Ultrasound;Gait training;Functional mobility training;Therapeutic activities;Neuromuscular re-education;Therapeutic exercise;Passive range of motion;Taping;Manual techniques;Patient/family education;DME Instruction;Balance training    PT Next Visit Plan  standing as tolerated , LE strength , parallel bars for support, Manual    PT Home Exercise Plan  LAQ, quad set, bridge, added 5/7 clam, SLR    Consulted and Agree with Plan of Care  Patient       Patient will benefit from skilled therapeutic intervention in order to improve the following deficits and impairments:  Decreased mobility, Difficulty walking, Increased edema, Obesity, Postural dysfunction, Impaired flexibility, Pain, Increased fascial restricitons, Hypomobility, Decreased strength, Decreased range of motion, Decreased endurance, Abnormal gait, Decreased activity tolerance  Visit Diagnosis: Chronic pain of left knee  Muscle weakness (generalized)  Localized edema  Difficulty in walking, not elsewhere classified  Chronic pain of right knee     Problem List Patient Active Problem List   Diagnosis Date Noted  . Acute kidney injury ([facility]) 03/02/2019  . Atrial fibrillation with RVR ([facility]) 02/26/2019  . Acute respiratory failure with hypoxia ([facility]) 02/22/2019  . COVID-19 02/22/2019  . Essential hypertension, benign 05/03/2018  . Hypertension 10/24/2017  . Knee osteoarthritis 10/24/2017    Takoya Jonas 06/25/2019, 12:26 PM  [facility] [address] [place], [place],  [place] Phone: [phone]   Fax:  [phone]  Name: Taralee Marcus MRN: [name] Date of Birth: 06/26/1950   01/27/1951, PT 06/25/19 12:26 PM Phone:  [phone] Fax: [phone]

## 2019-06-26 ENCOUNTER — Encounter: Payer: Self-pay | Admitting: Pediatric Intensive Care

## 2019-06-26 NOTE — Congregational Nurse Program (Signed)
  Dept: [phone]   Congregational Nurse Program Note  Date of Encounter: 06/26/2019  Past Medical History: Past Medical History:  Diagnosis Date  . Acute kidney injury ([facility]) 03/02/2019  . Acute respiratory failure with hypoxia ([facility]) 02/22/2019  . Atrial fibrillation with RVR ([facility]) 02/26/2019  . COVID-19 02/22/2019  . Flu-like symptoms 02/22/2019  . Hypertension   . Knee osteoarthritis 10/24/2017    Encounter Details:Medication assistance. Follow up 2 weeks. Shann Medal RN BSN CNP [phone]

## 2019-06-26 NOTE — Congregational Nurse Program (Signed)
  Dept: [phone]   Congregational Nurse Program Note  Date of Encounter: 06/18/2019  Past Medical History: Past Medical History:  Diagnosis Date  . Acute kidney injury ([facility]) 03/02/2019  . Acute respiratory failure with hypoxia ([facility]) 02/22/2019  . Atrial fibrillation with RVR ([facility]) 02/26/2019  . COVID-19 02/22/2019  . Flu-like symptoms 02/22/2019  . Hypertension   . Knee osteoarthritis 10/24/2017    Encounter Details: Medication check. Client has appointment for ID for Friday. Her letter of support for [employer] is completed and notarized. F[place]ow up 1 week. Shann Medal RN BSN CNP [phone]

## 2019-06-26 NOTE — Congregational Nurse Program (Signed)
  Dept: [phone]   Congregational Nurse Program Note  Date of Encounter: 06/12/2019  Past Medical History: Past Medical History:  Diagnosis Date  . Acute kidney injury (H[facility]) 03/02/2019  . Acute respiratory failure with hypoxia ([facility]) 02/22/2019  . Atrial fibrillation with RVR (H[facility]) 02/26/2019  . COVID-19 02/22/2019  . Flu-like symptoms 02/22/2019  . Hypertension   . Knee osteoarthritis 10/24/2017    Encounter Details: Medication check. Client referred to [facility] to have an ID made as her passport is expired. She is doing well with PT. Follow up with client next week. Shann Medal RN BSN CNP [phone]

## 2019-07-05 ENCOUNTER — Other Ambulatory Visit: Payer: Self-pay

## 2019-07-05 ENCOUNTER — Ambulatory Visit: Payer: Self-pay | Attending: Physician Assistant | Admitting: Physical Therapy

## 2019-07-05 ENCOUNTER — Encounter: Payer: Self-pay | Admitting: Physical Therapy

## 2019-07-05 DIAGNOSIS — M25562 Pain in left knee: Secondary | ICD-10-CM | POA: Insufficient documentation

## 2019-07-05 DIAGNOSIS — R262 Difficulty in walking, not elsewhere classified: Secondary | ICD-10-CM | POA: Insufficient documentation

## 2019-07-05 DIAGNOSIS — M6281 Muscle weakness (generalized): Secondary | ICD-10-CM | POA: Insufficient documentation

## 2019-07-05 DIAGNOSIS — R600 Localized edema: Secondary | ICD-10-CM | POA: Insufficient documentation

## 2019-07-05 DIAGNOSIS — G8929 Other chronic pain: Secondary | ICD-10-CM | POA: Insufficient documentation

## 2019-07-05 DIAGNOSIS — M25561 Pain in right knee: Secondary | ICD-10-CM | POA: Insufficient documentation

## 2019-07-05 NOTE — Therapy (Signed)
[place], [place], [address] Phone: [phone]   Fax:  [phone]  Physical Therapy Treatment  Patient Details  Name: [name] [name] MRN: [id number] Date of Birth: Aug 14, 1950 Referring Provider (PT): Jens Som, NP    Encounter Date: 07/05/2019  PT End of Session - 07/05/19 0936    Visit Number  10    Number of Visits  16    Date for PT Re-Evaluation  07/19/19    Authorization Type  CAFA    PT Start Time  0928    PT Stop Time  1013    PT Time Calculation (min)  45 min       Past Medical History:  Diagnosis Date  . Acute kidney injury ([place]) 03/02/2019  . Acute respiratory failure with hypoxia ([place]) 02/22/2019  . Atrial fibrillation with RVR ([place]) 02/26/2019  . COVID-19 02/22/2019  . Flu-like symptoms 02/22/2019  . Hypertension   . Knee osteoarthritis 10/24/2017    Past Surgical History:  Procedure Laterality Date  . NO PAST SURGERIES      There were no vitals filed for this visit.  Subjective Assessment - 07/05/19 0930    Subjective  Pt arrives with Lofstrand crutch and reports she started using it yesterday. She feels like her pain is less so that she can tolerate using the Lofstrand crutch. She reports 6/10 pain in knees and attributes it to the way she slept.    Patient is accompained by:  Interpreter    Currently in Pain?  Yes    Pain Score  6     Pain Location  Knee    Pain Orientation  Right;Left    Pain Descriptors / Indicators  Sore;Aching    Pain Type  Chronic pain    Aggravating Factors   walking, standing,bending    Pain Relieving Factors  rest, sitting         [facility] PT Assessment - 07/05/19 0001      Strength   Right Knee Flexion  4+/5    Right Knee Extension  4-/5    Left Knee Flexion  4+/5    Left Knee Extension  4/5                    [facility] Adult PT Treatment/Exercise - 07/05/19 0001      Ambulation/Gait   Ambulation/Gait  Yes    Ambulation/Gait Assistance  6: Modified independent (Device/Increase time)    Ambulation Distance (Feet)  100 Feet    Assistive device  L Forearm Crutch    Gait Pattern  Step-through pattern    Ambulation Surface  Indoor    Pre-Gait Activities  in parallel bars- step to gait with no UE, antalgic       Knee/Hip Exercises: Aerobic   Recumbent Bike  L1 x 5 minutes     Nustep  L5 5 minutes LE only       Knee/Hip Exercises: Standing   Heel Raises  Both;1 set;20 reps    Heel Raises Limitations  5 ;lbs cuff wgt    Hip Flexion  Stengthening;Both;1 set;10 reps;Knee bent    Hip Flexion Limitations  5 lbs     Other Standing Knee Exercises  five time STS 30 seconds without UE support     Other Standing Knee Exercises  On [employer]: static balance, narrow balance with head turns, marching with 2 UE , attmepted 1 UE however unable to lift RLE without LUE assist.  Knee/Hip Exercises: Seated   Long Arc Quad  Strengthening;Both;2 sets;15 reps;Weights    Long Arc Quad Weight  5 lbs.    Sit to [employer]  10 reps               PT Short Term Goals - 06/14/19 0848      PT SHORT TERM GOAL #1   Title  Pt will be I with initial HEP for knee AROM and strength    Status  Achieved      PT SHORT TERM GOAL #2   Title  Pt will be able to perform SLR on Rt LE without assist for improved sit to supine    Status  Achieved      PT SHORT TERM GOAL #3   Title  Pt will report less pain in Rt knee when sitting for improved comfort and rest at night    Status  Achieved        PT Long Term Goals - 07/05/19 1026      PT LONG TERM GOAL #1   Title  Pt will be I with HEP for LE strength and flexibility    Time  8    Period  Weeks    Status  On-going      PT LONG TERM GOAL #2   Title  Pt will be able to perform sit to stand x 5 in under 20 sec to demo increased functional mobility    Baseline  30 sec on 07/05/19    Time  8    Period  Weeks    Status  On-going      PT LONG TERM GOAL #3   Title  Pt will  be able to walk in her home without the walker and min increase in pain from baseline.    Baseline  using lofstrand crutch for 1 day without much increase in pain.    Time  8    Period  Weeks    Status  On-going      PT LONG TERM GOAL #4   Title  Pt will be able to walk in the community with walker (15 min ) and min increase in pain in knees    Time  8    Period  Weeks    Status  Unable to assess      PT LONG TERM GOAL #5   Title  Pt will be able to increase bilateral knee strength by 1/2 muscle grade in flexion and extension.    Baseline  knee flexion improved to 4+/5    Time  8    Period  Weeks    Status  Partially Met            Plan - 07/05/19 0936    Clinical Impression Statement  Pt reports improvement in overall pain allowing her to use Lofstrand Crutch in home and was able to bring it to session today. She reaches for assistance at walls and interpreter as she ambulates in and around clinic. Recommended she continue with RW outside of home for now. Continued with gait and mostly closed chain LE strength and proprioception. Her knee flexion strength has improved bilaterally. She is progressing toward her ambulation and AD goals.    PT Next Visit Plan  standing as tolerated , LE strength , parallel bars for support, Manual    PT Home Exercise Plan  LAQ, quad set, bridge, added 5/7 clam, SLR       Patient will  benefit from skilled therapeutic intervention in order to improve the following deficits and impairments:  Decreased mobility, Difficulty walking, Increased edema, Obesity, Postural dysfunction, Impaired flexibility, Pain, Increased fascial restricitons, Hypomobility, Decreased strength, Decreased range of motion, Decreased endurance, Abnormal gait, Decreased activity tolerance  Visit Diagnosis: Chronic pain of left knee  Muscle weakness (generalized)  Localized edema  Difficulty in walking, not elsewhere classified  Chronic pain of right knee     Problem  List Patient Active Problem List   Diagnosis Date Noted  . Acute kidney injury ([place]) 03/02/2019  . Atrial fibrillation with RVR ([place]) 02/26/2019  . Acute respiratory failure with hypoxia ([place]) 02/22/2019  . COVID-19 02/22/2019  . Essential hypertension, benign 05/03/2018  . Hypertension 10/24/2017  . Knee osteoarthritis 10/24/2017    Dorene Ar, PTA 07/05/2019, 10:32 AM  [facility] [address] [place], [place], [address] Phone: [phone]   Fax:  [phone]  Name: [name] [name] MRN: [id number] Date of Birth: 02/18/1950

## 2019-07-08 ENCOUNTER — Other Ambulatory Visit: Payer: Self-pay | Admitting: Family Medicine

## 2019-07-09 ENCOUNTER — Telehealth: Payer: Self-pay

## 2019-07-09 ENCOUNTER — Other Ambulatory Visit: Payer: Self-pay

## 2019-07-09 ENCOUNTER — Encounter: Payer: Self-pay | Admitting: Physical Therapy

## 2019-07-09 ENCOUNTER — Ambulatory Visit: Payer: Self-pay | Admitting: Physical Therapy

## 2019-07-09 DIAGNOSIS — R600 Localized edema: Secondary | ICD-10-CM

## 2019-07-09 DIAGNOSIS — M25561 Pain in right knee: Secondary | ICD-10-CM

## 2019-07-09 DIAGNOSIS — R262 Difficulty in walking, not elsewhere classified: Secondary | ICD-10-CM

## 2019-07-09 DIAGNOSIS — M6281 Muscle weakness (generalized): Secondary | ICD-10-CM

## 2019-07-09 DIAGNOSIS — G8929 Other chronic pain: Secondary | ICD-10-CM

## 2019-07-09 MED ORDER — PANTOPRAZOLE SODIUM 40 MG PO TBEC: 40.0000 mg | DELAYED_RELEASE_TABLET | Freq: Every day | ORAL | 2 refills | Status: DC

## 2019-07-09 NOTE — Therapy (Signed)
[place], [place], [address] Phone: [phone]   Fax:  [phone]  Physical Therapy Treatment  Patient Details  Name: [name] [name] MRN: [id number] Date of Birth: 05-21-1950 Referring Provider (PT): Jens Som, NP    Encounter Date: 07/09/2019  PT End of Session - 07/09/19 0936    Visit Number  11    Number of Visits  16    Date for PT Re-Evaluation  07/19/19    Authorization Type  CAFA    PT Start Time  0930    PT Stop Time  1015    PT Time Calculation (min)  45 min       Past Medical History:  Diagnosis Date  . Acute kidney injury ([place]) 03/02/2019  . Acute respiratory failure with hypoxia ([place]) 02/22/2019  . Atrial fibrillation with RVR ([place]) 02/26/2019  . COVID-19 02/22/2019  . Flu-like symptoms 02/22/2019  . Hypertension   . Knee osteoarthritis 10/24/2017    Past Surgical History:  Procedure Laterality Date  . NO PAST SURGERIES      There were no vitals filed for this visit.  Subjective Assessment - 07/09/19 0935    Subjective  Pt arrives with Lofstrand crutch. She reports less pain in knees.    Currently in Pain?  Yes    Pain Score  5     Pain Location  Knee    Pain Orientation  Right;Left    Pain Descriptors / Indicators  Aching;Sore    Pain Type  Chronic pain                        [facility] Adult PT Treatment/Exercise - 07/09/19 0001      Knee/Hip Exercises: Aerobic   Recumbent Bike  L2 x 7 minutes       Knee/Hip Exercises: Standing   Heel Raises  Right;Left;20 reps    Other Standing Knee Exercises  side stepping in parallel bars with UE support , forward gait in parallel bars, needs UE with WB on RLE     Other Standing Knee Exercises  On Airex: marching with UE, step on and off foam pad x 10 each with UE support, standing hip abduction on airex x 10 each       Knee/Hip Exercises: Seated   Long Arc Quad  20 reps   2 ets   Long Arc Quad Weight  5 lbs.     Marching  20 reps   2 sets    Marching Weights  5 lbs.    Sit to [employer]  10 reps               PT Short Term Goals - 06/14/19 0848      PT SHORT TERM GOAL #1   Title  Pt will be I with initial HEP for knee AROM and strength    Status  Achieved      PT SHORT TERM GOAL #2   Title  Pt will be able to perform SLR on Rt LE without assist for improved sit to supine    Status  Achieved      PT SHORT TERM GOAL #3   Title  Pt will report less pain in Rt knee when sitting for improved comfort and rest at night    Status  Achieved        PT Long Term Goals - 07/05/19 1026      PT LONG TERM GOAL #  1   Title  Pt will be I with HEP for LE strength and flexibility    Time  8    Period  Weeks    Status  On-going      PT LONG TERM GOAL #2   Title  Pt will be able to perform sit to stand x 5 in under 20 sec to demo increased functional mobility    Baseline  30 sec on 07/05/19    Time  8    Period  Weeks    Status  On-going      PT LONG TERM GOAL #3   Title  Pt will be able to walk in her home without the walker and min increase in pain from baseline.    Baseline  using lofstrand crutch for 1 day without much increase in pain.    Time  8    Period  Weeks    Status  On-going      PT LONG TERM GOAL #4   Title  Pt will be able to walk in the community with walker (15 min ) and min increase in pain in knees    Time  8    Period  Weeks    Status  Unable to assess      PT LONG TERM GOAL #5   Title  Pt will be able to increase bilateral knee strength by 1/2 muscle grade in flexion and extension.    Baseline  knee flexion improved to 4+/5    Time  8    Period  Weeks    Status  Partially Met            Plan - 07/09/19 0959    Clinical Impression Statement  Pt arrives with Lofstrand crutch and reports continued improvement in tolerance to walking. Continued with LE strengthening, gait and balance. She tolerated the session well.    PT Next Visit Plan  standing as  tolerated , LE strength , parallel bars for support, Manual,    PT Home Exercise Plan  LAQ, quad set, bridge, added 5/7 clam, SLR       Patient will benefit from skilled therapeutic intervention in order to improve the following deficits and impairments:  Decreased mobility, Difficulty walking, Increased edema, Obesity, Postural dysfunction, Impaired flexibility, Pain, Increased fascial restricitons, Hypomobility, Decreased strength, Decreased range of motion, Decreased endurance, Abnormal gait, Decreased activity tolerance  Visit Diagnosis: Chronic pain of left knee  Muscle weakness (generalized)  Localized edema  Difficulty in walking, not elsewhere classified  Chronic pain of right knee     Problem List Patient Active Problem List   Diagnosis Date Noted  . Acute kidney injury ([place]) 03/02/2019  . Atrial fibrillation with RVR ([place]) 02/26/2019  . Acute respiratory failure with hypoxia ([place]) 02/22/2019  . COVID-19 02/22/2019  . Essential hypertension, benign 05/03/2018  . Hypertension 10/24/2017  . Knee osteoarthritis 10/24/2017    Dorene Ar, PTA 07/09/2019, 10:41 AM  [facility] [address] [place], [place], [address] Phone: [phone]   Fax:  [phone]  Name: Chania Kochanski MRN: [id number] Date of Birth: [birth year]

## 2019-07-09 NOTE — Telephone Encounter (Signed)
Refill sent.

## 2019-07-09 NOTE — Telephone Encounter (Signed)
Call received from Turkey Hussey,RN/Congregational nurse requesting refill of protonix for patient

## 2019-07-12 ENCOUNTER — Other Ambulatory Visit: Payer: Self-pay

## 2019-07-12 ENCOUNTER — Encounter: Payer: Self-pay | Admitting: Physical Therapy

## 2019-07-12 ENCOUNTER — Ambulatory Visit: Payer: Self-pay | Admitting: Physical Therapy

## 2019-07-12 DIAGNOSIS — R600 Localized edema: Secondary | ICD-10-CM

## 2019-07-12 DIAGNOSIS — G8929 Other chronic pain: Secondary | ICD-10-CM

## 2019-07-12 DIAGNOSIS — M6281 Muscle weakness (generalized): Secondary | ICD-10-CM

## 2019-07-12 DIAGNOSIS — R262 Difficulty in walking, not elsewhere classified: Secondary | ICD-10-CM

## 2019-07-12 NOTE — Therapy (Signed)
[place], [place], [address] Phone: [phone]   Fax:  [phone]  Physical Therapy Treatment  Patient Details  Name: [name] [name] MRN: [id number] Date of Birth: 09/15/1950 Referring Provider (PT): Jens Som, NP    Encounter Date: 07/12/2019   PT End of Session - 07/12/19 0926    Visit Number 12    Number of Visits 16    Date for PT Re-Evaluation 07/19/19    Authorization Type CAFA    PT Start Time 0920    PT Stop Time 1012    PT Time Calculation (min) 52 min    Activity Tolerance Patient tolerated treatment well    Behavior During Therapy [facility] for tasks assessed/performed           Past Medical History:  Diagnosis Date  . Acute kidney injury ([place]) 03/02/2019  . Acute respiratory failure with hypoxia ([place]) 02/22/2019  . Atrial fibrillation with RVR ([place]) 02/26/2019  . COVID-19 02/22/2019  . Flu-like symptoms 02/22/2019  . Hypertension   . Knee osteoarthritis 10/24/2017    Past Surgical History:  Procedure Laterality Date  . NO PAST SURGERIES      There were no vitals filed for this visit.   Subjective Assessment - 07/12/19 0922    Subjective Some days I have pain, some days it hurts alot.  She would like to cont PT a bit more.    Currently in Pain? Yes    Pain Location Knee    Pain Orientation Right;Left    Pain Descriptors / Indicators Sore;Tightness    Pain Type Chronic pain    Pain Frequency Intermittent    Aggravating Factors  walking, not doing enough, standing too long    Pain Relieving Factors usually a bit of exercise makes it better, meds,    Effect of Pain on Daily Activities has to be cautious                 [facility] Adult PT Treatment/Exercise - 07/12/19 0001      Ambulation/Gait   Assistive device Crutches    Gait Pattern Decreased stance time - right;Decreased weight shift to right;Right hip hike    Gait Comments leans far to L side, feel stress on L  hip.        Self-Care   Other Self-Care Comments  began to discuss plans for exercise post PT       Knee/Hip Exercises: Stretches   Active Hamstring Stretch Both;2 reps;30 seconds      Knee/Hip Exercises: Standing   Hip Flexion Stengthening;Both;1 set;15 reps    Hip Flexion Limitations high knee march with 1 UE support     Functional Squat 1 set;10 reps      Knee/Hip Exercises: Seated   Sit to Sand 10 reps      Knee/Hip Exercises: Supine   Bridges Strengthening;Both;1 set;10 reps    Straight Leg Raises Strengthening;Both;1 set;10 reps    Straight Leg Raise with External Rotation Strengthening;Both;10 reps      Knee/Hip Exercises: Sidelying   Hip ABduction Strengthening;Both;2 sets;10 reps    Clams x 20 red band            cold pack 10 min bil. Knees          PT Short Term Goals - 06/14/19 0848      PT SHORT TERM GOAL #1   Title Pt will be I with initial HEP for knee AROM and strength  Status Achieved      PT SHORT TERM GOAL #2   Title Pt will be able to perform SLR on Rt LE without assist for improved sit to supine    Status Achieved      PT SHORT TERM GOAL #3   Title Pt will report less pain in Rt knee when sitting for improved comfort and rest at night    Status Achieved             PT Long Term Goals - 07/05/19 1026      PT LONG TERM GOAL #1   Title Pt will be I with HEP for LE strength and flexibility    Time 8    Period Weeks    Status On-going      PT LONG TERM GOAL #2   Title Pt will be able to perform sit to stand x 5 in under 20 sec to demo increased functional mobility    Baseline 30 sec on 07/05/19    Time 8    Period Weeks    Status On-going      PT LONG TERM GOAL #3   Title Pt will be able to walk in her home without the walker and min increase in pain from baseline.    Baseline using lofstrand crutch for 1 day without much increase in pain.    Time 8    Period Weeks    Status On-going      PT LONG TERM GOAL #4   Title Pt  will be able to walk in the community with walker (15 min ) and min increase in pain in knees    Time 8    Period Weeks    Status Unable to assess      PT LONG TERM GOAL #5   Title Pt will be able to increase bilateral knee strength by 1/2 muscle grade in flexion and extension.    Baseline knee flexion improved to 4+/5    Time 8    Period Weeks    Status Partially Met                 Plan - 07/12/19 0926    Clinical Impression Statement Patient is showing greater independence with gait and mobility.  She does her HEP consistently. She reports she has less pain than previously and can get up and down stairs with less difficulty.  Suggested she make an appt to discuss options for further intervention for severe knee OA.    PT Treatment/Interventions ADLs/Self Care Home Management;Electrical Stimulation;Cryotherapy;Iontophoresis 43m/ml Dexamethasone;Moist Heat;Ultrasound;Gait training;Functional mobility training;Therapeutic activities;Neuromuscular re-education;Therapeutic exercise;Passive range of motion;Taping;Manual techniques;Patient/family education;DME Instruction;Balance training    PT Next Visit Plan standing as tolerated , LE strength , parallel bars for support, Manual    PT Home Exercise Plan LAQ, quad set, bridge, added 5/7 clam, SLR    Consulted and Agree with Plan of Care Patient           Patient will benefit from skilled therapeutic intervention in order to improve the following deficits and impairments:  Decreased mobility, Difficulty walking, Increased edema, Obesity, Postural dysfunction, Impaired flexibility, Pain, Increased fascial restricitons, Hypomobility, Decreased strength, Decreased range of motion, Decreased endurance, Abnormal gait, Decreased activity tolerance  Visit Diagnosis: Chronic pain of left knee  Muscle weakness (generalized)  Localized edema  Difficulty in walking, not elsewhere classified  Chronic pain of right knee     Problem  List Patient Active Problem List   Diagnosis Date  Noted  . Acute kidney injury ([place]) 03/02/2019  . Atrial fibrillation with RVR ([place]) 02/26/2019  . Acute respiratory failure with hypoxia ([place]) 02/22/2019  . COVID-19 02/22/2019  . Essential hypertension, benign 05/03/2018  . Hypertension 10/24/2017  . Knee osteoarthritis 10/24/2017    Dellie Piasecki 07/12/2019, 10:09 AM  [facility] [address] [place], [place], [address] Phone: [phone]   Fax:  [phone]  Name: Talecia Sherlin MRN: [id number] Date of Birth: Jul 17, 1950  Raeford Razor, PT 07/12/19 10:10 AM Phone: [phone] Fax: [phone]

## 2019-07-15 ENCOUNTER — Ambulatory Visit: Payer: Self-pay

## 2019-07-16 ENCOUNTER — Ambulatory Visit: Payer: Self-pay | Admitting: Physical Therapy

## 2019-07-16 ENCOUNTER — Other Ambulatory Visit: Payer: Self-pay

## 2019-07-16 DIAGNOSIS — R262 Difficulty in walking, not elsewhere classified: Secondary | ICD-10-CM

## 2019-07-16 DIAGNOSIS — R600 Localized edema: Secondary | ICD-10-CM

## 2019-07-16 DIAGNOSIS — M6281 Muscle weakness (generalized): Secondary | ICD-10-CM

## 2019-07-16 DIAGNOSIS — G8929 Other chronic pain: Secondary | ICD-10-CM

## 2019-07-16 NOTE — Therapy (Signed)
[place], [place], [address] Phone: [phone]   Fax:  [phone]  Physical Therapy Treatment  Patient Details  Name: [name] [name] MRN: [id number] Date of Birth: [birth year] Referring Provider (PT): Jens Som, NP    Encounter Date: 07/16/2019   PT End of Session - 07/16/19 1104    Visit Number 13    Number of Visits 16    Date for PT Re-Evaluation 07/19/19    Authorization Type CAFA    PT Start Time 0920    PT Stop Time 1020   not charged for last 15 min she was online with interpreter   PT Time Calculation (min) 60 min    Activity Tolerance Patient tolerated treatment well    Behavior During Therapy [facility] for tasks assessed/performed           Past Medical History:  Diagnosis Date  . Acute kidney injury ([place]) 03/02/2019  . Acute respiratory failure with hypoxia ([place]) 02/22/2019  . Atrial fibrillation with RVR ([place]) 02/26/2019  . COVID-19 02/22/2019  . Flu-like symptoms 02/22/2019  . Hypertension   . Knee osteoarthritis 10/24/2017    Past Surgical History:  Procedure Laterality Date  . NO PAST SURGERIES      There were no vitals filed for this visit.   Subjective Assessment - 07/16/19 0926    Subjective Took >10 min to get [place] interpreter via audio. #35.  She missed her financial assist appt yesterday transportation did not get her . Pain in knes after therapy prevents me from walking much at home.  I have so many muscle aches.    Currently in Pain? Yes    Pain Score 5     Pain Location Knee    Pain Orientation Right;Left    Pain Descriptors / Indicators Aching;Sore    Pain Type Chronic pain    Pain Onset More than a month ago    Pain Frequency Intermittent    Aggravating Factors  sometimes exercises, standing and walking    Pain Relieving Factors meds, rest, stretching              [facility] PT Assessment - 07/16/19 0001      Strength   Right Knee Flexion 5/5    Right  Knee Extension 4/5   pain   Left Knee Flexion 5/5    Left Knee Extension 4+/5            [facility] Adult PT Treatment/Exercise - 07/16/19 0001      Self-Care   Other Self-Care Comments  options for further treatment, orthopedic       Knee/Hip Exercises: Stretches   Active Hamstring Stretch Limitations 3 x 30 sec seated    foot on bolster Edge of Mat      Knee/Hip Exercises: Aerobic   Stationary Bike 5 min end of session L1    Nustep --      Knee/Hip Exercises: Seated   Long Arc Quad Strengthening;Both;1 set;15 reps      Knee/Hip Exercises: Supine   Quad Sets Strengthening;Both;1 set;10 reps    Short Arc Quad Sets Limitations 3 lbs x 20 R/L    Heel Slides --   used ball x 10    Heel Slides Limitations AROM Rt knee     Bridges Limitations legs on ball x 10     Straight Leg Raises Strengthening;Both;1 set;10 reps  PT Short Term Goals - 06/14/19 0848      PT SHORT TERM GOAL #1   Title Pt will be I with initial HEP for knee AROM and strength    Status Achieved      PT SHORT TERM GOAL #2   Title Pt will be able to perform SLR on Rt LE without assist for improved sit to supine    Status Achieved      PT SHORT TERM GOAL #3   Title Pt will report less pain in Rt knee when sitting for improved comfort and rest at night    Status Achieved             PT Long Term Goals - 07/05/19 1026      PT LONG TERM GOAL #1   Title Pt will be I with HEP for LE strength and flexibility    Time 8    Period Weeks    Status On-going      PT LONG TERM GOAL #2   Title Pt will be able to perform sit to stand x 5 in under 20 sec to demo increased functional mobility    Baseline 30 sec on 07/05/19    Time 8    Period Weeks    Status On-going      PT LONG TERM GOAL #3   Title Pt will be able to walk in her home without the walker and min increase in pain from baseline.    Baseline using lofstrand crutch for 1 day without much increase in pain.    Time 8     Period Weeks    Status On-going      PT LONG TERM GOAL #4   Title Pt will be able to walk in the community with walker (15 min ) and min increase in pain in knees    Time 8    Period Weeks    Status Unable to assess      PT LONG TERM GOAL #5   Title Pt will be able to increase bilateral knee strength by 1/2 muscle grade in flexion and extension.    Baseline knee flexion improved to 4+/5    Time 8    Period Weeks    Status Partially Met                 Plan - 07/16/19 0944    Clinical Impression Statement Patient is eager to exercise but reports pain and soreness 24 hours post PT.  She has 1 more visit in this episode of PT. I recommended she follow up with Dr. Marlou Sa to explore additional options for her, asking about a cortisone injection as an adjunct to PT.  Kee flexion to 90 deg with physio ball heel slides. Rt knee strength 4-/5 to 4/5 in quads with pain .    PT Treatment/Interventions ADLs/Self Care Home Management;Electrical Stimulation;Cryotherapy;Iontophoresis 40m/ml Dexamethasone;Moist Heat;Ultrasound;Gait training;Functional mobility training;Therapeutic activities;Neuromuscular re-education;Therapeutic exercise;Passive range of motion;Taping;Manual techniques;Patient/family education;DME Instruction;Balance training    PT Next Visit Plan standing as tolerated , LE strength in OKC/CKC  , parallel bars for support, Manual    PT Home Exercise Plan LAQ, quad set, bridge, added 5/7 clam, SLR    Recommended Other Services ortho    Consulted and Agree with Plan of Care Patient           Patient will benefit from skilled therapeutic intervention in order to improve the following deficits and impairments:  Decreased mobility, Difficulty walking, Increased  edema, Obesity, Postural dysfunction, Impaired flexibility, Pain, Increased fascial restricitons, Hypomobility, Decreased strength, Decreased range of motion, Decreased endurance, Abnormal gait, Decreased activity  tolerance  Visit Diagnosis: Chronic pain of left knee  Chronic pain of right knee  Muscle weakness (generalized)  Localized edema  Difficulty in walking, not elsewhere classified     Problem List Patient Active Problem List   Diagnosis Date Noted  . Acute kidney injury ([place]) 03/02/2019  . Atrial fibrillation with RVR ([place]) 02/26/2019  . Acute respiratory failure with hypoxia ([place]) 02/22/2019  . COVID-19 02/22/2019  . Essential hypertension, benign 05/03/2018  . Hypertension 10/24/2017  . Knee osteoarthritis 10/24/2017    Armanie Ullmer 07/16/2019, 11:10 AM  [facility] [address] [place], [place], [address] Phone: [phone]   Fax:  [phone]  Name: [name] [name] MRN: [id number] Date of Birth: December 23, 1950  Raeford Razor, PT 07/16/19 11:11 AM Phone: [phone] Fax: [phone]

## 2019-07-19 ENCOUNTER — Ambulatory Visit: Payer: Self-pay | Admitting: Physical Therapy

## 2019-07-19 DIAGNOSIS — M6281 Muscle weakness (generalized): Secondary | ICD-10-CM

## 2019-07-19 DIAGNOSIS — M25562 Pain in left knee: Secondary | ICD-10-CM

## 2019-07-19 DIAGNOSIS — R262 Difficulty in walking, not elsewhere classified: Secondary | ICD-10-CM

## 2019-07-19 DIAGNOSIS — M25561 Pain in right knee: Secondary | ICD-10-CM

## 2019-07-19 NOTE — Therapy (Signed)
[facility] [address] [place], [place], [address] Phone: [phone]   Fax:  [phone]  Physical Therapy Treatment  Patient Details  Name: [name] [name] MRN: [id number] Date of Birth: [birth year] Referring Provider (PT): Janne Lab, NP    Encounter Date: 07/19/2019   PT End of Session - 07/19/19 1041    Visit Number 14    Number of Visits 16    Date for PT Re-Evaluation 07/19/19    Authorization Type CAFA    PT Start Time 0916    PT Stop Time 1015    PT Time Calculation (min) 59 min    Activity Tolerance Patient tolerated treatment well    Behavior During Therapy [facility] for tasks assessed/performed           Past Medical History:  Diagnosis Date  . Acute kidney injury ([facility]) 03/02/2019  . Acute respiratory failure with hypoxia ([facility]) 02/22/2019  . Atrial fibrillation with RVR ([facility]) 02/26/2019  . COVID-19 02/22/2019  . Flu-like symptoms 02/22/2019  . Hypertension   . Knee osteoarthritis 10/24/2017    Past Surgical History:  Procedure Laterality Date  . NO PAST SURGERIES      There were no vitals filed for this visit.   Subjective Assessment - 07/19/19 1015    Subjective I feel good today.  my R knee is so painful sometimes. Uses crutch at home.    Currently in Pain? Yes    Pain Score 5                  [facility] Adult PT Treatment/Exercise - 07/19/19 0001      Ambulation/Gait   Ambulation/Gait Yes    Ambulation/Gait Assistance 3: Mod assist    Ambulation/Gait Assistance Details HHA     Ambulation Distance (Feet) 25 Feet    Gait Pattern Antalgic    Gait Comments heavy lean to LLE due to Rt knee pain       Knee/Hip Exercises: Aerobic   Stationary Bike 8 min UE and LE    L 5      Knee/Hip Exercises: Standing   Heel Raises Both;1 set;20 reps    Hip Abduction Stengthening;Both;2 sets;10 reps    Abduction Limitations 2    Hip Extension Stengthening;Both;2 sets;10 reps    Extension Limitations 2         Knee/Hip Exercises: Seated   Long Arc Quad Both;1 set;20 reps;Weights    Long Arc Quad Weight 2 lbs.    Marching Strengthening;Both;1 set;10 reps    Marching Limitations unable to bear wgt on R without bilateral UE assist     Sit to Sand 1 set;10 reps      Knee/Hip Exercises: Sidelying   Hip ABduction Strengthening;Both;1 set;10 reps    Hip ABduction Limitations 2       Cryotherapy   Number Minutes Cryotherapy 10 Minutes    Cryotherapy Location Knee    Type of Cryotherapy Ice pack                    PT Short Term Goals - 07/19/19 6389      PT SHORT TERM GOAL #1   Title Pt will be I with initial HEP for knee AROM and strength    Status Achieved      PT SHORT TERM GOAL #2   Title Pt will be able to perform SLR on Rt LE without assist for improved sit to supine    Status Achieved  PT SHORT TERM GOAL #3   Title Pt will report less pain in Rt knee when sitting for improved comfort and rest at night    Status Achieved             PT Long Term Goals - 07/19/19 3818      PT LONG TERM GOAL #1   Title Pt will be I with HEP for LE strength and flexibility    Status On-going      PT LONG TERM GOAL #2   Title Pt will be able to perform sit to stand x 5 in under 20 sec to demo increased functional mobility    Baseline 16 sec 07/19/19    Status Achieved      PT LONG TERM GOAL #3   Title Pt will be able to walk in her home without the walker and min increase in pain from baseline.    Status Achieved      PT LONG TERM GOAL #4   Title Pt will be able to walk in the community with walker (15 min ) and min increase in pain in knees    Status Achieved      PT LONG TERM GOAL #5   Title Pt will be able to increase bilateral knee strength by 1/2 muscle grade in flexion and extension.                 Plan - 07/19/19 0921    Clinical Impression Statement Patient reports feeling good since Tuesday.  Sit to stand time much improved.  She continues to walk  slowly with significantly antalgic gait, too painful to do without crutch.  Lacks full knee extension with SLR despite cues .    PT Treatment/Interventions ADLs/Self Care Home Management;Electrical Stimulation;Cryotherapy;Iontophoresis 4mg /ml Dexamethasone;Moist Heat;Ultrasound;Gait training;Functional mobility training;Therapeutic activities;Neuromuscular re-education;Therapeutic exercise;Passive range of motion;Taping;Manual techniques;Patient/family education;DME Instruction;Balance training    PT Next Visit Plan standing as tolerated , LE strength in OKC/CKC  , parallel bars for support, Manual    PT Home Exercise Plan LAQ, quad set, bridge, added 5/7 clam, SLR    Consulted and Agree with Plan of Care Patient           Patient will benefit from skilled therapeutic intervention in order to improve the following deficits and impairments:  Decreased mobility, Difficulty walking, Increased edema, Obesity, Postural dysfunction, Impaired flexibility, Pain, Increased fascial restricitons, Hypomobility, Decreased strength, Decreased range of motion, Decreased endurance, Abnormal gait, Decreased activity tolerance  Visit Diagnosis: Chronic pain of left knee  Chronic pain of right knee  Muscle weakness (generalized)  Difficulty in walking, not elsewhere classified     Problem List Patient Active Problem List   Diagnosis Date Noted  . Acute kidney injury ([place]) 03/02/2019  . Atrial fibrillation with RVR ([place]) 02/26/2019  . Acute respiratory failure with hypoxia ([place]) 02/22/2019  . COVID-19 02/22/2019  . Essential hypertension, benign 05/03/2018  . Hypertension 10/24/2017  . Knee osteoarthritis 10/24/2017    Cyara Devoto 07/19/2019, 11:18 AM  [place], [place], [address] Phone: [phone]   Fax:  [phone]  Name: Alveena Taira MRN: [id number] Date of Birth: 10/18/1950  Raeford Razor,  PT 07/19/19 11:18 AM Phone: [phone] Fax: [phone]

## 2019-07-22 ENCOUNTER — Telehealth: Payer: Self-pay

## 2019-07-22 ENCOUNTER — Other Ambulatory Visit: Payer: Self-pay | Admitting: Internal Medicine

## 2019-07-22 ENCOUNTER — Encounter: Payer: Self-pay | Admitting: Internal Medicine

## 2019-07-22 ENCOUNTER — Other Ambulatory Visit: Payer: Self-pay

## 2019-07-22 ENCOUNTER — Ambulatory Visit: Payer: Self-pay | Attending: Internal Medicine | Admitting: Internal Medicine

## 2019-07-22 VITALS — BP 136/78 | HR 66 | Temp 98.1°F | Resp 16 | Wt 212.0 lb

## 2019-07-22 DIAGNOSIS — E669 Obesity, unspecified: Secondary | ICD-10-CM | POA: Insufficient documentation

## 2019-07-22 DIAGNOSIS — M170 Bilateral primary osteoarthritis of knee: Secondary | ICD-10-CM

## 2019-07-22 DIAGNOSIS — I10 Essential (primary) hypertension: Secondary | ICD-10-CM

## 2019-07-22 DIAGNOSIS — E1169 Type 2 diabetes mellitus with other specified complication: Secondary | ICD-10-CM

## 2019-07-22 DIAGNOSIS — Z23 Encounter for immunization: Secondary | ICD-10-CM

## 2019-07-22 DIAGNOSIS — E1165 Type 2 diabetes mellitus with hyperglycemia: Secondary | ICD-10-CM

## 2019-07-22 DIAGNOSIS — E7800 Pure hypercholesterolemia, unspecified: Secondary | ICD-10-CM

## 2019-07-22 DIAGNOSIS — Z1231 Encounter for screening mammogram for malignant neoplasm of breast: Secondary | ICD-10-CM

## 2019-07-22 DIAGNOSIS — Z0120 Encounter for dental examination and cleaning without abnormal findings: Secondary | ICD-10-CM

## 2019-07-22 LAB — POCT GLYCOSYLATED HEMOGLOBIN (HGB A1C): HbA1c POC (<> result, manual entry): 5.5 % (ref 4.0–5.6)

## 2019-07-22 LAB — GLUCOSE, POCT (MANUAL RESULT ENTRY): POC Glucose: 94 mg/dl (ref 70–99)

## 2019-07-22 MED ORDER — ROSUVASTATIN CALCIUM 10 MG PO TABS: 10.0000 mg | ORAL_TABLET | Freq: Every day | ORAL | 1 refills | Status: DC

## 2019-07-22 MED ORDER — GLIPIZIDE 5 MG PO TABS: 2.5000 mg | ORAL_TABLET | Freq: Every day | ORAL | 6 refills | Status: DC

## 2019-07-22 NOTE — Progress Notes (Signed)
Patient ID: [name] [name], adult    DOB: 10-Nov-1950  MRN: [id number]  CC: Diabetes and Hypertension   Subjective: [name] [name] is a 69 y.o. adult who presents for chronic disease management.  Previous PCP was Dr. Jillyn Hidden who is currently on leave. Her concerns today include:  Patient with history of HTN, atrial fibrillation, OA of the knees, history of COVID-19 infection, DM, HL  Pt does not have meds with her does not know the names.  Congregational nurse assist with set up of her medications per patient.  DM: pt was not aware that she has DM.  However she has DM based on previous A1C.  Suppose to be on low dose Glucotrol but she does not have meds with her. -reports she is trying to eat healthy and lose wgh.  Eats one solid meal a day. Gained 16 lbs since 03/2019.  Wants to lose wgh because she was told it will help decrease pain in knees. Not much activity due to end stage OA knees.  No falls.  Ambulates with cane.  She is currently doing some physical therapy but does not find it to be helpful in terms of pain relief.  I see Tylenol with codeine on her med list to use for pain.  She is not sure whether she has it at home. -She had seen orthopedics Dr. August Saucer last year.  He recommended getting her teeth fixed prior to doing any type of surgery.  She has not seen a dentist.   HTN/A.fib:  Reports compliance with meds though she does not know what they are.  She is also on a blood thinner Eliquis.  She denies any bruising or bleeding Has both Norvasc and Cardizem on list.  She is not sure what she is taking.  The most recent prescription seems to be for Norvasc. Denies any chest pains or shortness of breath.  She tries to limit salt in the foods.  Patient Active Problem List   Diagnosis Date Noted  . Acute kidney injury (HCC) 03/02/2019  . Atrial fibrillation with RVR (HCC) 02/26/2019  . Acute respiratory failure with hypoxia (HCC) 02/22/2019  . COVID-19 02/22/2019  .  Essential hypertension, benign 05/03/2018  . Hypertension 10/24/2017  . Knee osteoarthritis 10/24/2017     Current Outpatient Medications on File Prior to Visit  Medication Sig Dispense Refill  . acetaminophen-codeine (TYLENOL #3) 300-30 MG tablet Take 1 tablet by mouth every 6 (six) hours as needed for moderate pain. (Patient taking differently: Take 1 tablet by mouth every 6 (six) hours as needed (arthritis pain). ) 60 tablet 3  . amLODipine (NORVASC) 2.5 MG tablet Take 2 tablets (5 mg total) by mouth daily. To lower blood pressure 180 tablet 3  . ascorbic acid (VITAMIN C) 500 MG tablet Take 1 tablet (500 mg total) by mouth 2 (two) times daily. 60 tablet 0  . diclofenac sodium (VOLTAREN) 1 % GEL Apply 4 g topically 4 (four) times daily. Apply to feet 4 times daily PRN for pain. (Patient taking differently: Apply 4 g topically 4 (four) times daily as needed (knee pain). ) 100 g prn  . diltiazem (CARDIZEM CD) 120 MG 24 hr capsule Take 1 capsule (120 mg total) by mouth daily. 30 capsule 11  . ELIQUIS 5 MG TABS tablet TAKE 1 TABLET (5 MG TOTAL) BY MOUTH 2 (TWO) TIMES DAILY. 60 tablet 2  . furosemide (LASIX) 20 MG tablet Take 1 tablet (20 mg total) by mouth daily as  needed. Prn swelling 30 tablet 1  . gabapentin (NEURONTIN) 100 MG capsule Take 1 capsule (100 mg total) by mouth 3 (three) times daily. 90 capsule 6  . Ipratropium-Albuterol (COMBIVENT) 20-100 MCG/ACT AERS respimat Inhale 2 puffs into the lungs 3 (three) times daily. 4 g 0  . loratadine (CLARITIN) 10 MG tablet Take 1 tablet (10 mg total) by mouth daily. 30 tablet 0  . MAGNESIUM PO Take 1 tablet by mouth daily.    . pantoprazole (PROTONIX) 40 MG tablet Take 1 tablet (40 mg total) by mouth daily at 6 (six) AM. 30 tablet 2  . triamcinolone cream (KENALOG) 0.1 % Apply to dry skin 1-2 times daily sparingly (Patient taking differently: Apply 1 application topically See admin instructions. Apply sparingly to dark spots on face as needed for  itching.) 30 g 2  . zinc sulfate 220 (50 Zn) MG capsule Take 1 capsule (220 mg total) by mouth daily. 30 capsule 0   No current facility-administered medications on file prior to visit.    No Known Allergies  Social History   Socioeconomic History  . Marital status: Widowed    Spouse name: Not on file  . Number of children: Not on file  . Years of education: Not on file  . Highest education level: Not on file  Occupational History  . Not on file  Tobacco Use  . Smoking status: Never Smoker  . Smokeless tobacco: Never Used  Vaping Use  . Vaping Use: Never used  Substance and Sexual Activity  . Alcohol use: Never  . Drug use: Never  . Sexual activity: Not Currently  Other Topics Concern  . Not on file  Social History Narrative  . Not on file   Social Determinants of Health   Financial Resource Strain:   . Difficulty of Paying Living Expenses:   Food Insecurity:   . Worried About Charity fundraiser in the Last Year:   . Arboriculturist in the Last Year:   Transportation Needs:   . Film/video editor (Medical):   Marland Kitchen Lack of Transportation (Non-Medical):   Physical Activity:   . Days of Exercise per Week:   . Minutes of Exercise per Session:   Stress:   . Feeling of Stress :   Social Connections:   . Frequency of Communication with Friends and Family:   . Frequency of Social Gatherings with Friends and Family:   . Attends Religious Services:   . Active Member of Clubs or Organizations:   . Attends Archivist Meetings:   Marland Kitchen Marital Status:   Intimate Partner Violence:   . Fear of Current or Ex-Partner:   . Emotionally Abused:   Marland Kitchen Physically Abused:   . Sexually Abused:     Family History  Family history unknown: Yes    Past Surgical History:  Procedure Laterality Date  . NO PAST SURGERIES      ROS: Review of Systems Negative except as stated above  PHYSICAL EXAM: BP 136/78   Pulse 66   Temp 98.1 F (36.7 C)   Resp 16   Wt 212 lb  (96.2 kg)   SpO2 95%   BMI 36.39 kg/m   Wt Readings from Last 3 Encounters:  07/22/19 212 lb (96.2 kg)  05/15/19 192 lb 9.6 oz (87.4 kg)  03/20/19 196 lb (88.9 kg)    Physical Exam  General appearance - alert, well appearing, and in no distress Mental status - normal mood, behavior, speech,  dress, motor activity, and thought processes Neck - supple, no significant adenopathy Chest - clear to auscultation, no wheezes, rales or rhonchi, symmetric air entry Heart - normal rate, regular rhythm, normal S1, S2, no murmurs, rubs, clicks or gallops Musculoskeletal -enlargement of both knees.  She has moderate to severe discomfort with attempted passive range of motion.  She ambulates with a cane  extremities -no lower extremity edema.  Results for orders placed or performed in visit on 07/22/19  POCT glucose (manual entry)  Result Value Ref Range   POC Glucose 94 70 - 99 mg/dl  POCT glycosylated hemoglobin (Hb A1C)  Result Value Ref Range   Hemoglobin A1C     HbA1c POC (<> result, manual entry) 5.5 4.0 - 5.6 %   HbA1c, POC (prediabetic range)     HbA1c, POC (controlled diabetic range)      CMP Latest Ref Rng & Units 03/20/2019 03/05/2019 03/04/2019  Glucose 65 - 99 mg/dL 485(I) 627(O) 350(K)  BUN 8 - 27 mg/dL 9 93(G) 18(E)  Creatinine 0.57 - 1.00 mg/dL 9.93 7.16(R) 6.78(L)  Sodium 134 - 144 mmol/L 148(H) 138 135  Potassium 3.5 - 5.2 mmol/L 3.8 4.9 5.1  Chloride 96 - 106 mmol/L 109(H) 107 106  CO2 20 - 29 mmol/L 17(L) 23 21(L)  Calcium 8.7 - 10.3 mg/dL 9.5 8.0(L) 8.2(L)  Total Protein 6.0 - 8.5 g/dL 7.7 5.7(L) 5.7(L)  Total Bilirubin 0.0 - 1.2 mg/dL <3.8 0.6 0.9  Alkaline Phos 39 - 117 IU/L 100 58 61  AST 0 - 40 IU/L 15 16 18   ALT 0 - 32 IU/L 17 25 23    Lipid Panel     Component Value Date/Time   CHOL 211 (H) 08/08/2018 1145   TRIG 102 02/22/2019 1703   HDL 45 08/08/2018 1145   CHOLHDL 4.7 (H) 08/08/2018 1145   LDLCALC 150 (H) 08/08/2018 1145    CBC    Component Value  Date/Time   WBC 5.4 03/20/2019 1948   WBC 12.0 (H) 03/02/2019 0925   RBC 4.92 03/20/2019 1948   RBC 4.60 03/02/2019 0925   HGB 13.0 03/20/2019 1948   HCT 41.0 03/20/2019 1948   PLT 274 03/20/2019 1948   MCV 83 03/20/2019 1948   MCH 26.4 (L) 03/20/2019 1948   MCH 26.7 03/02/2019 0925   MCHC 31.7 03/20/2019 1948   MCHC 34.2 03/02/2019 0925   RDW 16.4 (H) 03/20/2019 1948   LYMPHSABS 2.1 03/20/2019 1948   MONOABS 0.3 03/01/2019 0742   EOSABS 0.1 03/20/2019 1948   BASOSABS 0.0 03/20/2019 1948    ASSESSMENT AND PLAN: 1. Type 2 diabetes mellitus with hyperglycemia, unspecified whether long term insulin use (HCC) At goal.  Continue low-dose glipizide.  Healthy eating habits discussed. - POCT glucose (manual entry) - POCT glycosylated hemoglobin (Hb A1C) - Microalbumin / creatinine urine ratio - glipiZIDE (GLUCOTROL) 5 MG tablet; Take 0.5 tablets (2.5 mg total) by mouth daily before breakfast. For high blood sugar  Dispense: 30 tablet; Refill: 6  2. Primary osteoarthritis of both knees Very limited and adversely affects her ability to carry out ADLs.  I will refer her to the dentist to see if her dental issues can be addressed so that we can send her back to Dr. 03/22/2019 to be considered for knee replacement surgery  3. Essential hypertension Close to goal.  However I have asked her to bring all of her medicines with her in 2 weeks to meet with our clinical pharmacist so that he can  do a medication reconciliation.  Of note I see that she has Cardizem and Norvasc on the med list and she should not be on both.  4. Hypercholesterolemia - rosuvastatin (CRESTOR) 10 MG tablet; Take 1 tablet (10 mg total) by mouth daily. To lower cholesterol  Dispense: 90 tablet; Refill: 1  5. Encounter for screening mammogram for malignant neoplasm of breast - MM Digital Screening; Future  6. Need for Tdap vaccination Given today  7. Encounter for routine dental examination - Ambulatory referral to  Dentistry    Patient was given the opportunity to ask questions.  Patient verbalized understanding of the plan and was able to repeat key elements of the plan.   Orders Placed This Encounter  Procedures  . MM Digital Screening  . Tdap vaccine greater than or equal to 7yo IM  . Microalbumin / creatinine urine ratio  . Ambulatory referral to Dentistry  . POCT glucose (manual entry)  . POCT glycosylated hemoglobin (Hb A1C)     Requested Prescriptions   Signed Prescriptions Disp Refills  . glipiZIDE (GLUCOTROL) 5 MG tablet 30 tablet 6    Sig: Take 0.5 tablets (2.5 mg total) by mouth daily before breakfast. For high blood sugar  . rosuvastatin (CRESTOR) 10 MG tablet 90 tablet 1    Sig: Take 1 tablet (10 mg total) by mouth daily. To lower cholesterol    Return in about 4 months (around 11/21/2019) for  f/u with [facility] in 4 mths.  Give appt with Franky Macho in 1 week for med reconciliation.  Jonah Blue, MD, FACP

## 2019-07-22 NOTE — Telephone Encounter (Signed)
Patient in need of transportation home.  She said to contact the congregational nurse.  As per Shann Medal, RN/ CNP, she did not arrange the transportation.  This CM spoke to [employer], patient's contact and she said that she did not arrange transportation.   Call placed to [employer], spoke to [place] who said that they did not arrange the ride for the patient to [facility] today and they have no schedule for pick up.  This CM then arranged pickup.  The driver, [place], the car make and license plate number were confirmed when driver arrived. The patient's address was also confirmed -[address] [place].

## 2019-07-22 NOTE — Patient Instructions (Signed)
Td (Tetanus, Diphtheria) Vaccine: What You Need to Know 1. Why get vaccinated? Td vaccine can prevent tetanus and diphtheria. Tetanus enters the body through cuts or wounds. Diphtheria spreads from person to person.  TETANUS (T) causes painful stiffening of the muscles. Tetanus can lead to serious health problems, including being unable to open the mouth, having trouble swallowing and breathing, or death.  DIPHTHERIA (D) can lead to difficulty breathing, heart failure, paralysis, or death. 2. Td vaccine Td is only for children 7 years and older, adolescents, and adults.  Td is usually given as a booster dose every 10 years, but it can also be given earlier after a severe and dirty wound or burn. Another vaccine, called Tdap, that protects against pertussis, also known as "whooping cough," in addition to tetanus and diphtheria, may be used instead of Td.  Td may be given at the same time as other vaccines. 3. Talk with your health care provider Tell your vaccine provider if the person getting the vaccine:  Has had an allergic reaction after a previous dose of any vaccine that protects against tetanus or diphtheria, or has any severe, life-threatening allergies.  Has ever had Guillain-Barr Syndrome (also called GBS).  Has had severe pain or swelling after a previous dose of any vaccine that protects against tetanus or diphtheria. In some cases, your health care provider may decide to postpone Td vaccination to a future visit.  People with minor illnesses, such as a cold, may be vaccinated. People who are moderately or severely ill should usually wait until they recover before getting Td vaccine.  Your health care provider can give you more information. 4. Risks of a vaccine reaction  Pain, redness, or swelling where the shot was given, mild fever, headache, feeling tired, and nausea, vomiting, diarrhea, or stomachache sometimes happen after Td vaccine. People sometimes faint after medical  procedures, including vaccination. Tell your provider if you feel dizzy or have vision changes or ringing in the ears.  As with any medicine, there is a very remote chance of a vaccine causing a severe allergic reaction, other serious injury, or death. 5. What if there is a serious problem? An allergic reaction could occur after the vaccinated person leaves the clinic. If you see signs of a severe allergic reaction (hives, swelling of the face and throat, difficulty breathing, a fast heartbeat, dizziness, or weakness), call 9-1-1 and get the person to the nearest hospital.  For other signs that concern you, call your health care provider.  Adverse reactions should be reported to the Vaccine Adverse Event Reporting System (VAERS). Your health care provider will usually file this report, or you can do it yourself. Visit the VAERS website at www.vaers.hhs.gov or call 1-800-822-7967. VAERS is only for reporting reactions, and VAERS staff do not give medical advice. 6. The National Vaccine Injury Compensation Program The National Vaccine Injury Compensation Program (VICP) is a federal program that was created to compensate people who may have been injured by certain vaccines. Visit the VICP website at www.hrsa.gov/vaccinecompensation or call 1-800-338-2382 to learn about the program and about filing a claim. There is a time limit to file a claim for compensation. 7. How can I learn more?  Ask your health care provider.  Call your local or state health department.  Contact the Centers for Disease Control and Prevention (CDC): ? Call 1-800-232-4636 (1-800-CDC-INFO) or ? Visit CDC's website at www.cdc.gov/vaccines Vaccine Information Statement Td Vaccine (05/02/18) This information is not intended to replace advice given   to you by your health care provider. Make sure you discuss any questions you have with your health care provider. Document Revised: 06/11/2018 Document Reviewed: 05/14/2018 Elsevier  Patient Education  2020 Elsevier Inc.  

## 2019-07-23 ENCOUNTER — Ambulatory Visit: Payer: Self-pay | Admitting: Physical Therapy

## 2019-07-23 DIAGNOSIS — R600 Localized edema: Secondary | ICD-10-CM

## 2019-07-23 DIAGNOSIS — M25562 Pain in left knee: Secondary | ICD-10-CM

## 2019-07-23 DIAGNOSIS — M6281 Muscle weakness (generalized): Secondary | ICD-10-CM

## 2019-07-23 DIAGNOSIS — G8929 Other chronic pain: Secondary | ICD-10-CM

## 2019-07-23 DIAGNOSIS — R262 Difficulty in walking, not elsewhere classified: Secondary | ICD-10-CM

## 2019-07-23 NOTE — Patient Instructions (Signed)
Standing Hip Adbduction    Lever et carter une jambe de ct. Revenir. Utiliser un poids de _0___ kg autour de la [place]. Rpter [employer]. Faire __2 x 10 __ sries. Faire ____2  Coralie Carpen par jour.  http://gt2.exer.us/393   Copyright  VHI. All rights reserved.  Functional Quadriceps: Sit to Stand    [employer] bord [facility], les pieds  plat sur le sol. Se lever debout en tendant les genox compltement. Rpter ___2 x 10 _ fois par srie. Faire __2__ sries par sance. Faire __2__ sances par jour.  http://orth.exer.us/734     Copyright  VHI. All rights reserved.

## 2019-07-23 NOTE — Therapy (Signed)
[place], [place], [address] Phone: [phone]   Fax:  [phone]  Physical Therapy Treatment/Discharge Patient Details  Name: Oneika Simonian MRN: [id number] Date of Birth: 1950/04/08 Referring Provider (PT): Jens Som, NP    Encounter Date: 07/23/2019   PT End of Session - 07/23/19 0926    Visit Number 15    Date for PT Re-Evaluation 07/19/19    Authorization Type CAFA    PT Start Time 0922    PT Stop Time 1002    PT Time Calculation (min) 40 min    Activity Tolerance Patient tolerated treatment well    Behavior During Therapy [facility] for tasks assessed/performed           Past Medical History:  Diagnosis Date  . Acute kidney injury ([place]) 03/02/2019  . Acute respiratory failure with hypoxia ([place]) 02/22/2019  . Atrial fibrillation with RVR ([place]) 02/26/2019  . COVID-19 02/22/2019  . Flu-like symptoms 02/22/2019  . Hypertension   . Knee osteoarthritis 10/24/2017    Past Surgical History:  Procedure Laterality Date  . NO PAST SURGERIES      There were no vitals filed for this visit.   Subjective Assessment - 07/23/19 0924    Subjective Rt knee discomfort/pain today              [facility] PT Assessment - 07/23/19 0001      Strength   Right Knee Flexion 5/5    Right Knee Extension 4+/5    Left Knee Flexion 5/5    Left Knee Extension 4+/5                         [facility] Adult PT Treatment/Exercise - 07/23/19 0001      Knee/Hip Exercises: Aerobic   Stationary Bike 6 min L1       Knee/Hip Exercises: Standing   Hip Abduction Stengthening;Both;2 sets;10 reps      Knee/Hip Exercises: Seated   Long Arc Quad Both;1 set;20 reps;Weights    Sit to [employer] 1 set;10 reps      Knee/Hip Exercises: Supine   Bridges Strengthening;Both;1 set;20 reps    Bridges Limitations hold 5 sec      Knee/Hip Exercises: Sidelying   Clams green band x 15           Increased time today for  audio interpreter.  Delay and poor connection at times.  Reinforced the need for her to see Dr. Marlou Sa 08/08/19 and to continue HEP.         PT Education - 07/23/19 1004    Education Details DC and HEP final, goals and progress    Person(s) Educated Patient    Methods Explanation;Handout    Comprehension Verbalized understanding;Returned demonstration;Verbal cues required            PT Short Term Goals - 07/19/19 0921      PT SHORT TERM GOAL #1   Title Pt will be I with initial HEP for knee AROM and strength    Status Achieved      PT SHORT TERM GOAL #2   Title Pt will be able to perform SLR on Rt LE without assist for improved sit to supine    Status Achieved      PT SHORT TERM GOAL #3   Title Pt will report less pain in Rt knee when sitting for improved comfort and rest at night    Status Achieved  PT Long Term Goals - 07/23/19 1005      PT LONG TERM GOAL #1   Title Pt will be I with HEP for LE strength and flexibility    Status Achieved      PT LONG TERM GOAL #2   Title Pt will be able to perform sit to stand x 5 in under 20 sec to demo increased functional mobility    Baseline 16 sec 07/19/19    Status Achieved      PT LONG TERM GOAL #3   Title Pt will be able to walk in her home without the walker and min increase in pain from baseline.    Baseline using lofstrand crutch for 1 day without much increase in pain., varies in pain    Status Partially Met      PT LONG TERM GOAL #4   Title Pt will be able to walk in the community with walker (15 min ) and min increase in pain in knees    Baseline uses crutch, pain can be moderate at times    Status Partially Met      PT LONG TERM GOAL #5   Title Pt will be able to increase bilateral knee strength by 1/2 muscle grade in flexion and extension.    Baseline knee flexion improved to 5/5, and ext 4+/5    Status Achieved                 Plan - 07/23/19 0936    Clinical Impression Statement Patient  has met her LTGs . Despite pain she has good strength in her knees.  She is walking with 1 crutch in and out of her home.  She will be returning to Dr. Marlou Sa in a couple of weeks to discuss further options for significant OA in Rt >Lt. knee. DC from PT    PT Treatment/Interventions ADLs/Self Care Home Management;Electrical Stimulation;Cryotherapy;Iontophoresis 86m/ml Dexamethasone;Moist Heat;Ultrasound;Gait training;Functional mobility training;Therapeutic activities;Neuromuscular re-education;Therapeutic exercise;Passive range of motion;Taping;Manual techniques;Patient/family education;DME Instruction;Balance training    PT Next Visit Plan DC    PT Home Exercise Plan LAQ, quad set, bridge, clam, SLR. Give green band.  Sit to stand.           Patient will benefit from skilled therapeutic intervention in order to improve the following deficits and impairments:  Decreased mobility, Difficulty walking, Increased edema, Obesity, Postural dysfunction, Impaired flexibility, Pain, Increased fascial restricitons, Hypomobility, Decreased strength, Decreased range of motion, Decreased endurance, Abnormal gait, Decreased activity tolerance  Visit Diagnosis: Chronic pain of left knee  Chronic pain of right knee  Muscle weakness (generalized)  Difficulty in walking, not elsewhere classified  Localized edema     Problem List Patient Active Problem List   Diagnosis Date Noted  . Type 2 diabetes mellitus with obesity ([place] 07/22/2019  . Acute kidney injury ([place] 03/02/2019  . Atrial fibrillation with RVR ([place] 02/26/2019  . Acute respiratory failure with hypoxia ([place] 02/22/2019  . COVID-19 02/22/2019  . Essential hypertension, benign 05/03/2018  . Hypertension 10/24/2017  . Knee osteoarthritis 10/24/2017    Mirenda Baltazar 07/23/2019, 10:10 AM  [facility] [address]Phone: [phone]  Fax:   [phone] Name: VZynasia BurklowMRN: [id number] of Birth: 101-17-1952  PHYSICAL THERAPY DISCHARGE SUMMARY  Visits from Start of Care: 15  Current functional level related to goals / functional outcomes: See above    Remaining deficits: Hip strength, poor core control.  Knee pain. ROM.   Education / Equipment: HEP, knee OA  Plan: Patient agrees to discharge.  Patient goals were met. Patient is being discharged due to meeting the stated rehab goals.  ?????      Raeford Razor, PT 07/23/19 10:10 AM Phone: [phone] Fax: [phone]

## 2019-07-24 ENCOUNTER — Ambulatory Visit: Payer: Self-pay | Admitting: Family Medicine

## 2019-07-24 ENCOUNTER — Other Ambulatory Visit: Payer: Self-pay

## 2019-07-24 ENCOUNTER — Ambulatory Visit: Payer: Self-pay

## 2019-07-26 ENCOUNTER — Ambulatory Visit: Payer: Self-pay | Admitting: Physical Therapy

## 2019-07-26 ENCOUNTER — Ambulatory Visit: Payer: Self-pay

## 2019-07-27 ENCOUNTER — Telehealth: Payer: Self-pay | Admitting: Internal Medicine

## 2019-07-27 DIAGNOSIS — M170 Bilateral primary osteoarthritis of knee: Secondary | ICD-10-CM

## 2019-07-27 NOTE — Telephone Encounter (Signed)
Note from congregational nurse reviewed.  Pt has already seen dentist and had procedures done.  Therefore, I will send her back to Dr. August Saucer to be considered for TKR.

## 2019-07-27 NOTE — Telephone Encounter (Signed)
-----   Message from Robyne Peers, RN sent at 07/24/2019  3:14 PM EDT ----- Dr Laural Benes,  Lorain Childes - this is from the congregational nurse  ----- Message ----- From: Tama High, RN Sent: 07/24/2019  10:06 AM EDT To: Robyne Peers, RN  Abran Duke- I was reviewing Dr Henriette Combs note. Ms Randell Patient was seen at [facility] and had procedures there. She does not need another referral.  Thanks!

## 2019-07-30 ENCOUNTER — Encounter: Payer: Self-pay | Admitting: Pediatric Intensive Care

## 2019-08-07 ENCOUNTER — Encounter: Payer: Self-pay | Admitting: Pediatric Intensive Care

## 2019-08-07 NOTE — Congregational Nurse Program (Signed)
  Dept: [phone]   Congregational Nurse Program Note  Date of Encounter: 07/30/2019  Past Medical History: Past Medical History:  Diagnosis Date  . Acute kidney injury (HCC) 03/02/2019  . Acute respiratory failure with hypoxia (HCC) 02/22/2019  . Atrial fibrillation with RVR (HCC) 02/26/2019  . COVID-19 02/22/2019  . Flu-like symptoms 02/22/2019  . Hypertension   . Knee osteoarthritis 10/24/2017    Encounter Details: Encounter for review of medications. Client completed CAFA appointment and has renewed blue card. Shann Medal RN BSN CNP [phone]

## 2019-08-08 ENCOUNTER — Ambulatory Visit (INDEPENDENT_AMBULATORY_CARE_PROVIDER_SITE_OTHER): Payer: Self-pay | Admitting: Orthopedic Surgery

## 2019-08-08 VITALS — Ht 62.75 in | Wt 210.0 lb

## 2019-08-08 DIAGNOSIS — M1711 Unilateral primary osteoarthritis, right knee: Secondary | ICD-10-CM

## 2019-08-08 DIAGNOSIS — M1712 Unilateral primary osteoarthritis, left knee: Secondary | ICD-10-CM

## 2019-08-09 ENCOUNTER — Telehealth: Payer: Self-pay | Admitting: *Deleted

## 2019-08-09 NOTE — Telephone Encounter (Signed)
   [facility] Pre-operative Risk Assessment    HEARTCARE STAFF: - Please ensure there is not already an duplicate clearance open for this procedure. - Under Visit Info/Reason for Call, type in Other and utilize the format Clearance MM/DD/YY or Clearance TBD. Do not use dashes or single digits. - If request is for dental extraction, please clarify the # of teeth to be extracted.  Request for surgical clearance:  1. What type of surgery is being performed? Right total knee athroplasty   2. When is this surgery scheduled? TBD   3. What type of clearance is required (medical clearance vs. Pharmacy clearance to hold med vs. Both)? Both  4. Are there any medications that need to be held prior to surgery and how long? Eliquis   5. Practice name and name of physician performing surgery? [facility]; Dr Alphonzo Severance   6. What is the office phone number? [phone]   7.   What is the office fax number? [name] [name]  8.   Anesthesia type (None, local, MAC, general) ? Spinal   [name] [name] 08/09/2019, 4:04 PM  _________________________________________________________________   (provider comments below)

## 2019-08-09 NOTE — Telephone Encounter (Signed)
Clinical pharmacist to review Eliquis 

## 2019-08-10 ENCOUNTER — Encounter: Payer: Self-pay | Admitting: Orthopedic Surgery

## 2019-08-10 NOTE — Progress Notes (Signed)
Office Visit Note   Patient: [name] [name]           Date of Birth: Jun 02, 1950           MRN: [id number] Visit Date: 08/08/2019 Requested by: Cain Saupe, MD [address] [place],  [place] [address] PCP: Cain Saupe, MD  Subjective: Chief Complaint  Patient presents with  . Left Knee - Pain  . Right Knee - Pain    HPI: There is a patient with bilateral knee pain right worse than left.  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  States that the pain is constant.  She will wake with pain every night.  It is difficult for her to weight-bear.  She has diminished walking endurance.  She was seen about a year ago but with Covid she has essentially gone in the hibernation set of speak.  She has had a dentist see her regarding her teeth.  This is information through translator and per her caregiver.  She speaks some variant of [name].  She is taking Neurontin and Tylenol 3.  She is on blood thinner for new onset atrial fibrillation this year.  She also has type 2 diabetes which is reasonably well controlled.  She would like to have surgical intervention due to the unrelenting pain in the right knee.  She denies any radicular components or back pain.  She denies any previous knee surgery and denies any history of DVT or pulmonary embolism in her or her family.              ROS: All systems reviewed are negative as they relate to the chief complaint within the history of present illness.  Patient denies  fevers or chills.   Assessment & Plan: Visit Diagnoses: No diagnosis found.  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  After a very long discussion with [name] today which spanned well over 30 minutes via translation and questions and answers she would like to proceed with total knee replacement.  The risk and benefits are discussed including but not limited to infection nerve  vessel damage knee stiffness incomplete pain relief as well as the potential need for revision.  I would favor cemented knee replacement in this patient due to her deformity and the potential for constrained or semiconstrained components.  Patient understands the vigorous and rigorous nature of the rehabilitative process required in order to achieve a good result.  All questions answered.  We do need to get cardiac with stratification and instructions for anticoagulant management.  Once that is achieved we will schedule her for total knee replacement.  All questions answered.  Follow-Up Instructions: No follow-ups on file.   Orders:  No orders of the defined types were placed in this encounter.  No orders of the defined types were placed in this encounter.     Procedures: No procedures performed   Clinical Data: No additional findings.  Objective: Vital Signs: Ht 5' 2.75" (1.594 m)   Wt 210 lb (95.3 kg)   BMI 37.50 kg/m   Physical Exam:   Constitutional: Patient appears well-developed HEENT:  Head: Normocephalic Eyes:EOM are normal Neck: Normal range of motion Cardiovascular: Normal rate Pulmonary/chest: Effort normal Neurologic: Patient is alert Skin: Skin is warm Psychiatric: Patient has normal mood and affect    Ortho Exam: Ortho exam demonstrates palpable pedal pulses bilaterally with varus alignment bilaterally worse on the right  compared to the left.  She has no groin pain on the right with internal extra rotation of the leg.  Lacks about 5 to 10 degrees of extension and flexes to about 10 5-1 10.  Extensor mechanism is intact.  No masses lymphadenopathy or skin changes noted in that right knee region.  Specialty Comments:  No specialty comments available.  Imaging: No results found.   PMFS History: Patient Active Problem List   Diagnosis Date Noted  . 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 Acute kidney injury (HCC) 03/02/2019  . Atrial  fibrillation with RVR (HCC) 02/26/2019  . Acute respiratory failure with hypoxia (HCC) 02/22/2019  . COVID-19 02/22/2019  . Essential hypertension, benign 05/03/2018  . Hypertension 10/24/2017  . Knee osteoarthritis 10/24/2017   Past Medical History:  Diagnosis Date  . Acute kidney injury (HCC) 03/02/2019  . Acute respiratory failure with hypoxia (HCC) 02/22/2019  . Atrial fibrillation with RVR (HCC) 02/26/2019  . COVID-19 02/22/2019  . Flu-like symptoms 02/22/2019  . Hypertension   . Knee osteoarthritis 10/24/2017    Family History  Family history unknown: Yes    Past Surgical History:  Procedure Laterality Date  . NO PAST SURGERIES     Social History   Occupational History  . Not on file  Tobacco Use  . Smoking status: Never Smoker  . Smokeless tobacco: Never Used  Vaping Use  . Vaping Use: Never used  Substance and Sexual Activity  . Alcohol use: Never  . Drug use: Never  . Sexual activity: Not Currently

## 2019-08-12 NOTE — Congregational Nurse Program (Signed)
  Dept: [phone]   Congregational Nurse Program Note  Date of Encounter: 08/07/2019  Past Medical History: Past Medical History:  Diagnosis Date  . Acute kidney injury (HCC) 03/02/2019  . Acute respiratory failure with hypoxia (HCC) 02/22/2019  . Atrial fibrillation with RVR (HCC) 02/26/2019  . COVID-19 02/22/2019  . Flu-like symptoms 02/22/2019  . Hypertension   . Knee osteoarthritis 10/24/2017    Encounter Details:Visit for medication assistance. Client has appointment with ortho tomorrow to discuss knee replacement surgery. Community Health Worker Early Katrinka Blazing will take client to appointment and provide interpretation. CN will follow up with client next week. Shann Medal Rn BSN CNP [phone]

## 2019-08-12 NOTE — Telephone Encounter (Signed)
   Primary Cardiologist: Armanda Magic, MD  Chart reviewed as part of pre-operative protocol coverage.   Left a voicemail with [name] (DPR) to assist with preoperative assessment.   Per pharmacy recommendations, patient cal hold eliquis 3 days prior to her upcoming knee surgery with plans to restart therapeutic dosing as soon as she is cleared to do so by her orthopedic surgeon.    [name], PA-C 08/12/2019, 11:17 AM

## 2019-08-12 NOTE — Telephone Encounter (Signed)
Patient with diagnosis of PAF on Eliquis for anticoagulation.    Procedure:  Right total knee athroplasty Date of procedure: TBD  CHADS2-VASc score of  5 (CHF, HTN, AGE, DM2,  female)  CrCl 63 mL/min using adjusted body weight  Per office protocol, patient can hold Eliquis for 3 days prior to procedure.    Patient will not need bridging with Lovenox (enoxaparin) around procedure.  If not bridging, patient should restart Eliquis on the evening of procedure or day after, at discretion of procedure MD  For orthopedic procedures please be sure to resume therapeutic (not prophylactic) dosing.

## 2019-08-14 ENCOUNTER — Encounter: Payer: Self-pay | Admitting: Pediatric Intensive Care

## 2019-08-14 NOTE — Telephone Encounter (Signed)
[name] is returning [name] [name]'s call. Please advise.

## 2019-08-15 NOTE — Telephone Encounter (Signed)
   Primary Cardiologist: Armanda Magic, MD  Chart reviewed as part of pre-operative protocol coverage. Patient was contacted 08/15/2019 in reference to pre-operative risk assessment for pending surgery as outlined below.  [name] [name] was last seen on 05/15/19 by Dr. Clifton James. [name] (DPR), assisted with history taking. Since that day, [name] [name] has done fine from a cardiac standpoint. She is limited in activity by knee pain, but otherwise is able to complete 4 METs without anginal complaints.  Therefore, based on ACC/AHA guidelines, the patient would be at acceptable risk for the planned procedure without further cardiovascular testing.   Per pharmacy recommendations, patient cal hold eliquis 3 days prior to her upcoming knee surgery with plans to restart therapeutic dosing as soon as she is cleared to do so by her orthopedic surgeon.   I will route this recommendation to the requesting party via Epic fax function and remove from pre-op pool. Please call with questions.  [name], PA-C 08/15/2019, 8:56 AM

## 2019-08-20 ENCOUNTER — Other Ambulatory Visit: Payer: Self-pay | Admitting: Family Medicine

## 2019-08-20 DIAGNOSIS — L853 Xerosis cutis: Secondary | ICD-10-CM

## 2019-08-23 ENCOUNTER — Other Ambulatory Visit: Payer: Self-pay | Admitting: Family Medicine

## 2019-08-23 DIAGNOSIS — Z1231 Encounter for screening mammogram for malignant neoplasm of breast: Secondary | ICD-10-CM

## 2019-08-27 ENCOUNTER — Ambulatory Visit: Admit: 2019-08-27 | Payer: Self-pay | Admitting: Orthopedic Surgery

## 2019-08-27 ENCOUNTER — Other Ambulatory Visit: Payer: Self-pay | Admitting: Family Medicine

## 2019-08-27 DIAGNOSIS — M545 Low back pain, unspecified: Secondary | ICD-10-CM

## 2019-08-27 DIAGNOSIS — M79672 Pain in left foot: Secondary | ICD-10-CM

## 2019-08-27 SURGERY — ARTHROPLASTY, KNEE, TOTAL
Anesthesia: Spinal | Site: Knee | Laterality: Right

## 2019-08-27 NOTE — Congregational Nurse Program (Signed)
  Dept: [phone]   Congregational Nurse Program Note  Date of Encounter: 08/14/2019  Past Medical History: Past Medical History:  Diagnosis Date  . Acute kidney injury (HCC) 03/02/2019  . Acute respiratory failure with hypoxia (HCC) 02/22/2019  . Atrial fibrillation with RVR (HCC) 02/26/2019  . COVID-19 02/22/2019  . Flu-like symptoms 02/22/2019  . Hypertension   . Knee osteoarthritis 10/24/2017    Encounter Details: Visit to fill pill boxes. Client needs gabapentin refilled. Client asks about time for knee replacement surgery. CN states she doesn't know but will contact [facility] for information. CN will see client in 1 week. Shann Medal RN BSN CNP [phone]

## 2019-09-05 ENCOUNTER — Encounter: Payer: Self-pay | Admitting: Pediatric Intensive Care

## 2019-09-11 ENCOUNTER — Encounter: Payer: Self-pay | Admitting: Pediatric Intensive Care

## 2019-09-11 NOTE — Congregational Nurse Program (Signed)
  Dept: [phone]   Congregational Nurse Program Note  Date of Encounter: 09/11/2019  Past Medical History: Past Medical History:  Diagnosis Date  . Acute kidney injury (HCC) 03/02/2019  . Acute respiratory failure with hypoxia (HCC) 02/22/2019  . Atrial fibrillation with RVR (HCC) 02/26/2019  . COVID-19 02/22/2019  . Flu-like symptoms 02/22/2019  . Hypertension   . Knee osteoarthritis 10/24/2017    Encounter Details: Encounter for medication. Client needs 6 month follow up with [facility]. CN will coordinate with Early Katrinka Blazing CHW. Shann Medal RN BSN CNP [phone]

## 2019-09-11 NOTE — Congregational Nurse Program (Signed)
  Dept: [phone]   Congregational Nurse Program Note  Date of Encounter: 09/05/2019  Past Medical History: Past Medical History:  Diagnosis Date  . Acute kidney injury (HCC) 03/02/2019  . Acute respiratory failure with hypoxia (HCC) 02/22/2019  . Atrial fibrillation with RVR (HCC) 02/26/2019  . COVID-19 02/22/2019  . Flu-like symptoms 02/22/2019  . Hypertension   . Knee osteoarthritis 10/24/2017    Encounter Details: Encounter for medication. Return in 1 week. Shann Medal RN BSN CNP [phone]

## 2019-09-25 ENCOUNTER — Encounter: Payer: Self-pay | Admitting: Pediatric Intensive Care

## 2019-09-25 NOTE — Congregational Nurse Program (Signed)
  Dept: [phone]   Congregational Nurse Program Note  Date of Encounter: 09/25/2019  Past Medical History: Past Medical History:  Diagnosis Date  . Acute kidney injury (HCC) 03/02/2019  . Acute respiratory failure with hypoxia (HCC) 02/22/2019  . Atrial fibrillation with RVR (HCC) 02/26/2019  . COVID-19 02/22/2019  . Flu-like symptoms 02/22/2019  . Hypertension   . Knee osteoarthritis 10/24/2017    Encounter Details: Visit for medication. Client is out of gabapentin. CN will contact [facility] pharmacy. Client has mammography appointment tomorrow. CN will contact CHW Early regarding need for transportation. Client presents overdue bill from [facility]. CN called billing on client's behalf and explained that client is on 100% financial assistance. They have put bills on hold. Follow up with client on Tuesday of next week. Shann Medal RN BSN CNP [phone]

## 2019-09-26 ENCOUNTER — Ambulatory Visit
Admission: RE | Admit: 2019-09-26 | Discharge: 2019-09-26 | Disposition: A | Discharge status: Home / Self Care | Payer: No Typology Code available for payment source | Source: Ambulatory Visit | Attending: Family Medicine | Admitting: Family Medicine

## 2019-09-26 ENCOUNTER — Other Ambulatory Visit: Payer: Self-pay

## 2019-09-26 DIAGNOSIS — Z1231 Encounter for screening mammogram for malignant neoplasm of breast: Secondary | ICD-10-CM

## 2019-10-02 ENCOUNTER — Encounter: Payer: Self-pay | Admitting: Pediatric Intensive Care

## 2019-10-03 ENCOUNTER — Other Ambulatory Visit: Payer: Self-pay | Admitting: Obstetrics and Gynecology

## 2019-10-03 DIAGNOSIS — R928 Other abnormal and inconclusive findings on diagnostic imaging of breast: Secondary | ICD-10-CM

## 2019-10-08 ENCOUNTER — Telehealth: Payer: Self-pay | Admitting: Pediatric Intensive Care

## 2019-10-08 ENCOUNTER — Telehealth: Payer: Self-pay

## 2019-10-08 MED ORDER — APIXABAN 5 MG PO TABS: ORAL_TABLET | ORAL | 2 refills | Status: DC

## 2019-10-08 NOTE — Congregational Nurse Program (Signed)
  Dept: [phone]   Congregational Nurse Program Note  Date of Encounter: 10/02/2019  Past Medical History: Past Medical History:  Diagnosis Date  . Acute kidney injury (HCC) 03/02/2019  . Acute respiratory failure with hypoxia (HCC) 02/22/2019  . Atrial fibrillation with RVR (HCC) 02/26/2019  . COVID-19 02/22/2019  . Flu-like symptoms 02/22/2019  . Hypertension   . Knee osteoarthritis 10/24/2017    Encounter Details:[employer] [place] interpreter Remi Deter, CN explained to client that elective surgeries are now on hold. CN advised client that she would continue to be in contact with [facility] regarding a surgery date.  Shann Medal RN BSN CNP [phone]

## 2019-10-08 NOTE — Telephone Encounter (Signed)
Call to [facility] with Melody. Client has no Eloquis refills. She has an appointment with Dr Jillyn Hidden on Friday. Melody will get a message to PCP regarding refills. Shann Medal Rn BSN CNP [phone]

## 2019-10-08 NOTE — Telephone Encounter (Signed)
Rx sent 

## 2019-10-08 NOTE — Telephone Encounter (Signed)
Message received from [place] RN/Congregatiomal Nurse stating that the patient ha no refills left on her eliquis.  She has appt with Dr Jillyn Hidden 10/11/2019

## 2019-10-09 ENCOUNTER — Encounter: Payer: Self-pay | Admitting: Pediatric Intensive Care

## 2019-10-09 NOTE — Congregational Nurse Program (Signed)
  Dept: [phone]   Congregational Nurse Program Note  Date of Encounter: 10/09/2019  Past Medical History: Past Medical History:  Diagnosis Date  . Acute kidney injury (HCC) 03/02/2019  . Acute respiratory failure with hypoxia (HCC) 02/22/2019  . Atrial fibrillation with RVR (HCC) 02/26/2019  . COVID-19 02/22/2019  . Flu-like symptoms 02/22/2019  . Hypertension   . Knee osteoarthritis 10/24/2017    Encounter Details: Encounter to have client set up daily medication in pill container. CN provided medication list in [name] with directions for use for each medication. Client was able to fill pill container with some assistance. She was able to compare bottle labels with medication list. CN will return next Wednesday. CN encouraged client to practice with empty pill container. Shann Medal RN BSN CNP [phone]

## 2019-10-10 ENCOUNTER — Telehealth: Payer: Self-pay | Admitting: Pediatric Intensive Care

## 2019-10-10 NOTE — Telephone Encounter (Signed)
Call to [facility] transportation services to set up ride for tomorrow. Shann Medal R BSN CNP [phone]

## 2019-10-11 ENCOUNTER — Encounter: Payer: Self-pay | Admitting: Family Medicine

## 2019-10-11 ENCOUNTER — Ambulatory Visit: Payer: No Typology Code available for payment source | Attending: Family Medicine | Admitting: Family Medicine

## 2019-10-11 ENCOUNTER — Other Ambulatory Visit: Payer: Self-pay

## 2019-10-11 VITALS — BP 126/72 | HR 52 | Ht 62.0 in | Wt 218.0 lb

## 2019-10-11 DIAGNOSIS — Z7901 Long term (current) use of anticoagulants: Secondary | ICD-10-CM

## 2019-10-11 DIAGNOSIS — M170 Bilateral primary osteoarthritis of knee: Secondary | ICD-10-CM

## 2019-10-11 DIAGNOSIS — E119 Type 2 diabetes mellitus without complications: Secondary | ICD-10-CM

## 2019-10-11 DIAGNOSIS — K5900 Constipation, unspecified: Secondary | ICD-10-CM

## 2019-10-11 DIAGNOSIS — Z603 Acculturation difficulty: Secondary | ICD-10-CM

## 2019-10-11 DIAGNOSIS — Z789 Other specified health status: Secondary | ICD-10-CM

## 2019-10-11 DIAGNOSIS — I10 Essential (primary) hypertension: Secondary | ICD-10-CM

## 2019-10-11 DIAGNOSIS — I4891 Unspecified atrial fibrillation: Secondary | ICD-10-CM

## 2019-10-11 DIAGNOSIS — Z758 Other problems related to medical facilities and other health care: Secondary | ICD-10-CM

## 2019-10-11 DIAGNOSIS — Z01818 Encounter for other preprocedural examination: Secondary | ICD-10-CM

## 2019-10-11 LAB — POCT GLYCOSYLATED HEMOGLOBIN (HGB A1C): HbA1c, POC (controlled diabetic range): 5.3 % (ref 0.0–7.0)

## 2019-10-11 LAB — GLUCOSE, POCT (MANUAL RESULT ENTRY): POC Glucose: 84 mg/dL (ref 70–99)

## 2019-10-11 NOTE — Progress Notes (Signed)
Established Patient Office Visit  Subjective:  Patient ID: [name] [name], adult    DOB: April 12, 1950  Age: 69 y.o. MRN: [id number]   [place] interpretation system used at today's visit.  Audio interpretation system used as no Careers adviser available for patient's native language.  CC:  Chief Complaint  Patient presents with  . Pre-op Exam    HPI [name] [name], 69 year old female, seen in follow-up of chronic medical issues and for preoperative evaluation prior to right knee replacement.  She reports that she has had a dental work which had previously kept her from having her knee surgery.  Overall she feels well at today's visit.  She reports that she is compliant with her medications.  She denies any headaches or dizziness related to her blood pressure.  She has had no chest pain or palpitations related to atrial fibrillation.  No unusual bruising or bleeding related to her use of blood thinning medication.  She continues to have chronic issues with bilateral knee pain right greater than left.  She also has some issues with constipation but is not currently taking any medication to help with her constipation.  She denies any blood in the stool no black stool.  She has had no increased thirst, no urinary frequency and no retention related to her diabetes.  She reports that she is following a healthy diet.  Past Medical History:  Diagnosis Date  . Acute kidney injury (HCC) 03/02/2019  . Acute respiratory failure with hypoxia (HCC) 02/22/2019  . Atrial fibrillation with RVR (HCC) 02/26/2019  . COVID-19 02/22/2019  . Flu-like symptoms 02/22/2019  . Hypertension   . Knee osteoarthritis 10/24/2017    Past Surgical History:  Procedure Laterality Date  . NO PAST SURGERIES      Family History  Family history unknown: Yes    Social History   Socioeconomic History  . Marital status: Widowed    Spouse name: Not on file  . Number of children: Not on file    . Years of education: Not on file  . Highest education level: Not on file  Occupational History  . Not on file  Tobacco Use  . Smoking status: Never Smoker  . Smokeless tobacco: Never Used  Vaping Use  . Vaping Use: Never used  Substance and Sexual Activity  . Alcohol use: Never  . Drug use: Never  . Sexual activity: Not Currently  Other Topics Concern  . Not on file  Social History Narrative  . Not on file   Social Determinants of Health   Financial Resource Strain:   . Difficulty of Paying Living Expenses: Not on file  Food Insecurity:   . Worried About Programme researcher, broadcasting/film/video in the Last Year: Not on file  . Ran Out of Food in the Last Year: Not on file  Transportation Needs:   . Lack of Transportation (Medical): Not on file  . Lack of Transportation (Non-Medical): Not on file  Physical Activity:   . Days of Exercise per Week: Not on file  . Minutes of Exercise per Session: Not on file  Stress:   . Feeling of Stress : Not on file  Social Connections:   . Frequency of Communication with Friends and Family: Not on file  . Frequency of Social Gatherings with Friends and Family: Not on file  . Attends Religious Services: Not on file  . Active Member of Clubs or Organizations: Not on file  . Attends Banker Meetings: Not  on file  . Marital Status: Not on file  Intimate Partner Violence:   . Fear of Current or Ex-Partner: Not on file  . Emotionally Abused: Not on file  . Physically Abused: Not on file  . Sexually Abused: Not on file    Outpatient Medications Prior to Visit  Medication Sig Dispense Refill  . acetaminophen-codeine (TYLENOL #3) 300-30 MG tablet Take 1 tablet by mouth every 6 (six) hours as needed for moderate pain. (Patient taking differently: Take 1 tablet by mouth every 6 (six) hours as needed (arthritis pain). ) 60 tablet 3  . amLODipine (NORVASC) 2.5 MG tablet Take 2 tablets (5 mg total) by mouth daily. To lower blood pressure 180 tablet 3   . apixaban (ELIQUIS) 5 MG TABS tablet TAKE 1 TABLET (5 MG TOTAL) BY MOUTH 2 (TWO) TIMES DAILY. 60 tablet 2  . ascorbic acid (VITAMIN C) 500 MG tablet Take 1 tablet (500 mg total) by mouth 2 (two) times daily. 60 tablet 0  . diclofenac sodium (VOLTAREN) 1 % GEL Apply 4 g topically 4 (four) times daily. Apply to feet 4 times daily PRN for pain. (Patient taking differently: Apply 4 g topically 4 (four) times daily as needed (knee pain). ) 100 g prn  . diltiazem (CARDIZEM CD) 120 MG 24 hr capsule Take 1 capsule (120 mg total) by mouth daily. 30 capsule 11  . furosemide (LASIX) 20 MG tablet Take 1 tablet (20 mg total) by mouth daily as needed. Prn swelling 30 tablet 1  . gabapentin (NEURONTIN) 100 MG capsule TAKE 1 CAPSULE (100 MG TOTAL) BY MOUTH 3 (THREE) TIMES DAILY. 90 capsule 2  . glipiZIDE (GLUCOTROL) 5 MG tablet Take 0.5 tablets (2.5 mg total) by mouth daily before breakfast. For high blood sugar 30 tablet 6  . Ipratropium-Albuterol (COMBIVENT) 20-100 MCG/ACT AERS respimat Inhale 2 puffs into the lungs 3 (three) times daily. 4 g 0  . loratadine (CLARITIN) 10 MG tablet Take 1 tablet (10 mg total) by mouth daily. 30 tablet 0  . MAGNESIUM PO Take 1 tablet by mouth daily.    . pantoprazole (PROTONIX) 40 MG tablet Take 1 tablet (40 mg total) by mouth daily at 6 (six) AM. 30 tablet 2  . rosuvastatin (CRESTOR) 10 MG tablet Take 1 tablet (10 mg total) by mouth daily. To lower cholesterol 90 tablet 1  . triamcinolone cream (KENALOG) 0.1 % APPLY TO DRY SKIN 1-2 TIMES DAILY SPARINGLY 30 g 2  . zinc sulfate 220 (50 Zn) MG capsule Take 1 capsule (220 mg total) by mouth daily. 30 capsule 0   No facility-administered medications prior to visit.    No Known Allergies  ROS Review of Systems  Constitutional: Negative for chills, fatigue and fever.  HENT: Negative for sore throat and trouble swallowing.   Eyes: Negative for photophobia and visual disturbance.  Gastrointestinal: Positive for constipation.  Negative for abdominal pain, blood in stool, diarrhea and nausea.  Endocrine: Negative for polydipsia, polyphagia and polyuria.  Genitourinary: Negative for dysuria and frequency.  Musculoskeletal: Positive for arthralgias, gait problem and joint swelling.  Skin: Negative for rash and wound.  Neurological: Negative for dizziness and headaches.  Hematological: Negative for adenopathy. Does not bruise/bleed easily.  Psychiatric/Behavioral: Negative for sleep disturbance. The patient is not nervous/anxious.       Objective:    Physical Exam Vitals and nursing note reviewed.  Constitutional:      General: She is not in acute distress.    Appearance: Normal appearance.  Comments: Well-nourished well-developed older female in no acute distress.  Patient is using a modified crutch to help with ambulation  Cardiovascular:     Rate and Rhythm: Normal rate. Rhythm irregular.  Pulmonary:     Effort: Pulmonary effort is normal.     Breath sounds: Normal breath sounds.  Abdominal:     General: There is distension.     Palpations: Abdomen is soft.     Tenderness: There is no abdominal tenderness. There is no right CVA tenderness, left CVA tenderness, guarding or rebound.  Musculoskeletal:        General: Tenderness present.     Right lower leg: No edema.     Left lower leg: No edema.     Comments: Motion at the knees and joint line tenderness  Skin:    General: Skin is warm and dry.  Neurological:     General: No focal deficit present.     Mental Status: She is alert and oriented to person, place, and time.  Psychiatric:        Mood and Affect: Mood normal.     BP 126/72   Pulse (!) 52   Ht 5\' 2"  (1.575 m)   Wt 218 lb (98.9 kg)   SpO2 98%   BMI 39.87 kg/m  Wt Readings from Last 3 Encounters:  10/11/19 218 lb (98.9 kg)  08/08/19 210 lb (95.3 kg)  07/22/19 212 lb (96.2 kg)     Health Maintenance Due  Topic Date Due  . FOOT EXAM  Never done  . OPHTHALMOLOGY EXAM  Never  done  . URINE MICROALBUMIN  Never done  . COLONOSCOPY  Never done  . DEXA SCAN  Never done  . INFLUENZA VACCINE  09/01/2019      Lab Results  Component Value Date   TSH 0.080 (L) 02/27/2019   Lab Results  Component Value Date   WBC 5.4 03/20/2019   HGB 13.0 03/20/2019   HCT 41.0 03/20/2019   MCV 83 03/20/2019   PLT 274 03/20/2019   Lab Results  Component Value Date   NA 148 (H) 03/20/2019   K 3.8 03/20/2019   CO2 17 (L) 03/20/2019   GLUCOSE 141 (H) 03/20/2019   BUN 9 03/20/2019   CREATININE 0.91 03/20/2019   BILITOT <0.2 03/20/2019   ALKPHOS 100 03/20/2019   AST 15 03/20/2019   ALT 17 03/20/2019   PROT 7.7 03/20/2019   ALBUMIN 3.6 (L) 03/20/2019   CALCIUM 9.5 03/20/2019   ANIONGAP 8 03/05/2019   Lab Results  Component Value Date   CHOL 211 (H) 08/08/2018   Lab Results  Component Value Date   HDL 45 08/08/2018   Lab Results  Component Value Date   LDLCALC 150 (H) 08/08/2018   Lab Results  Component Value Date   TRIG 102 02/22/2019   Lab Results  Component Value Date   CHOLHDL 4.7 (H) 08/08/2018   Lab Results  Component Value Date   HGBA1C 5.5 07/22/2019      Assessment & Plan:  1. Preoperative evaluation to rule out surgical contraindication Due to her atrial fibrillation on long-term use of anticoagulant, Eliquis, she has been referred for cardiology evaluation prior to upcoming planned knee replacement surgery.  Her hemoglobin A1c and blood glucose level at today's visit were both within normal.  She will have basic metabolic panel to check renal function as she has had some past issues with elevations in creatinine and we will also check CBC to look for  any anemia or platelet disorder related to her use of Eliquis. - Ambulatory referral to Cardiology - Basic Metabolic Panel - CBC with Differential  2. Primary osteoarthritis of both knees She has been followed by orthopedics with plan for knee replacement surgery.  Pain is currently controlled  with the use of over-the-counter Tylenol 3 as needed.  3. Essential hypertension Blood pressure is stable and controlled continue her current medications.  4. Controlled type 2 diabetes mellitus without complication, without long-term current use of insulin (HCC) She reports that her blood sugars have been well controlled.  Her last hemoglobin A1c was at 5.5 in June of this year and will be repeated at today's visit.  She denies any hypoglycemic episodes and no current symptoms related to her diabetes.  A1c at today's visit was 5.3 with blood sugar of 84.  Diabetes is currently well controlled with diet.   - Basic Metabolic Panel - POCT glucose (manual entry) - POCT glycosylated hemoglobin (Hb A1C)  5. Atrial fibrillation, unspecified type (HCC) 6. Long term current use of anticoagulant Rate is currently controlled.  She continues to take Eliquis as an anticoagulant to help decrease her risk of stroke associated with atrial fibrillation.  She will have CBC in follow-up of long-term use of anticoagulant to look for any anemia or platelet disorder.  She has been referred to cardiology for preoperative cardiovascular clearance and advice regarding anticoagulant use for planned knee replacement surgery. - Ambulatory referral to Cardiology - CBC with Differential  7. Constipation, unspecified constipation type She reports that she does have some medication at home to help with constipation.  She is encouraged to start the use of this medication.  Her constipation may be related to her use of Tylenol with Codeine to help with knee pain.  We will also check T4 and TSH to rule out thyroid disorder as a contributing factor.  She is encouraged to continue a diet rich in fresh fruits and vegetables as well as daily intake of water to help with constipation. - T4 AND TSH    Follow-up: Return in about 3 months (around 01/10/2020) for chronic issues and as needed.    Cain Saupeammie Swayze Pries, MD

## 2019-10-11 NOTE — Progress Notes (Signed)
Patient is needing a surgery clearance.

## 2019-10-12 LAB — BASIC METABOLIC PANEL WITH GFR
BUN/Creatinine Ratio: 15 (ref 12–28)
BUN: 16 mg/dL (ref 8–27)
CO2: 23 mmol/L (ref 20–29)
Calcium: 9.1 mg/dL (ref 8.7–10.3)
Chloride: 107 mmol/L — ABNORMAL HIGH (ref 96–106)
Creatinine, Ser: 1.06 mg/dL — ABNORMAL HIGH (ref 0.57–1.00)
GFR calc Af Amer: 61 mL/min/1.73
GFR calc non Af Amer: 53 mL/min/1.73 — ABNORMAL LOW
Glucose: 75 mg/dL (ref 65–99)
Potassium: 4.3 mmol/L (ref 3.5–5.2)
Sodium: 143 mmol/L (ref 134–144)

## 2019-10-12 LAB — CBC WITH DIFFERENTIAL/PLATELET
Basophils Absolute: 0 x10E3/uL (ref 0.0–0.2)
Basos: 1 %
EOS (ABSOLUTE): 0.2 x10E3/uL (ref 0.0–0.4)
Eos: 3 %
Hematocrit: 41.7 % (ref 34.0–46.6)
Hemoglobin: 12.9 g/dL (ref 11.1–15.9)
Immature Grans (Abs): 0 x10E3/uL (ref 0.0–0.1)
Immature Granulocytes: 0 %
Lymphocytes Absolute: 2.6 x10E3/uL (ref 0.7–3.1)
Lymphs: 44 %
MCH: 24.2 pg — ABNORMAL LOW (ref 26.6–33.0)
MCHC: 30.9 g/dL — ABNORMAL LOW (ref 31.5–35.7)
MCV: 78 fL — ABNORMAL LOW (ref 79–97)
Monocytes Absolute: 0.6 x10E3/uL (ref 0.1–0.9)
Monocytes: 10 %
Neutrophils Absolute: 2.4 x10E3/uL (ref 1.4–7.0)
Neutrophils: 42 %
Platelets: 123 x10E3/uL — ABNORMAL LOW (ref 150–450)
RBC: 5.34 x10E6/uL — ABNORMAL HIGH (ref 3.77–5.28)
RDW: 18.3 % — ABNORMAL HIGH (ref 11.7–15.4)
WBC: 5.7 x10E3/uL (ref 3.4–10.8)

## 2019-10-12 LAB — T4 AND TSH
T4, Total: 7.7 ug/dL (ref 4.5–12.0)
TSH: 0.956 u[IU]/mL (ref 0.450–4.500)

## 2019-10-15 ENCOUNTER — Other Ambulatory Visit: Payer: Self-pay

## 2019-10-15 ENCOUNTER — Ambulatory Visit: Payer: No Typology Code available for payment source

## 2019-10-15 ENCOUNTER — Ambulatory Visit: Payer: Self-pay | Admitting: *Deleted

## 2019-10-15 VITALS — BP 138/74 | Wt 215.8 lb

## 2019-10-15 DIAGNOSIS — Z01419 Encounter for gynecological examination (general) (routine) without abnormal findings: Secondary | ICD-10-CM

## 2019-10-15 NOTE — Progress Notes (Signed)
[name] is a 69 y.o. [id number] female who presents to [facility] clinic today with no complaints.    Pap Smear: Pap smear completed today. Last Pap smear was around 1985 and was normal per patient. Per patient has no history of an abnormal Pap smear. Last Pap smear result is not available in Epic.   Physical exam: Breasts Breasts symmetrical. No skin abnormalities bilateral breasts. No nipple retraction bilateral breasts. No nipple discharge bilateral breasts. No lymphadenopathy. No lumps palpated bilateral breasts. No complaints of pain or tenderness on exam.       Pelvic/Bimanual Ext Genitalia No lesions, no swelling and no discharge observed on external genitalia.        Vagina Vagina pink and normal texture. No lesions or discharge observed in vagina.        Cervix Cervix is present. Cervix pink and of normal texture. No discharge observed.    Uterus Uterus is present and palpable. Uterus in normal position and normal size.        Adnexae Bilateral ovaries present and palpable. No tenderness on palpation.         Rectovaginal No rectal exam completed today since patient had no rectal complaints. No skin abnormalities observed on exam.     Smoking History: Patient has never smoked.  Patient Navigation: Patient education provided. Access to services provided for patient through BCCCP program. [name] interpreter Allie Dimmer from [facility] provided.   Colorectal Cancer Screening: Per patient has never had colonoscopy completed. No complaints today.    Breast and Cervical Cancer Risk Assessment: Patient does not have family history of breast cancer, known genetic mutations, or radiation treatment to the chest before age 29. Patient does not have history of cervical dysplasia, immunocompromised, or DES exposure in-utero.  Risk Assessment   The Dondra Spry risk assessment model is based on data from cisgender female patients and might not be accurate for patients of other  genders or sexes.  Risk Scores      10/15/2019   Last edited by: Priscille Heidelberg, RN   5-year risk: 1.7 %   Lifetime risk: 4.8 %         A: BCCCP exam with pap smear Patient referred to [facility] by the [facility] due to recommending additional imaging of the right breast. Screening mammogram completed 09/26/2019.  P: Referred patient to the [facility] for a diagnostic mammogram per recommendation. Appointment scheduled Tuesday, October 22, 2019 at 1400.  Priscille Heidelberg, RN 10/15/2019 1:35 PM

## 2019-10-15 NOTE — Patient Instructions (Signed)
Explained breast self awareness with [name]. Pap smear completed today. Let patient know that if today's Pap smear is normal and HPV negative that she will not need any further Pap smears due to her age. Referred patient to the [facility] for a diagnostic mammogram per recommendation. Appointment scheduled Tuesday, October 22, 2019 at 1400. Patient aware of appointment and will be there. Let patient know will follow up with her within the next couple weeks with results or Pap smear by letter or phone. [name] [name] verbalized understanding.  Camron Monday, Kathaleen Maser, RN 1:35 PM

## 2019-10-16 ENCOUNTER — Other Ambulatory Visit: Payer: Self-pay | Admitting: Family Medicine

## 2019-10-16 ENCOUNTER — Encounter: Payer: Self-pay | Admitting: Pediatric Intensive Care

## 2019-10-16 DIAGNOSIS — K219 Gastro-esophageal reflux disease without esophagitis: Secondary | ICD-10-CM

## 2019-10-16 MED ORDER — PANTOPRAZOLE SODIUM 40 MG PO TBEC: 40.0000 mg | DELAYED_RELEASE_TABLET | Freq: Every day | ORAL | 3 refills | Status: DC

## 2019-10-16 NOTE — Congregational Nurse Program (Signed)
  Dept: [phone]   Congregational Nurse Program Note  Date of Encounter: 10/16/2019  Past Medical History: Past Medical History:  Diagnosis Date  . Acute kidney injury (HCC) 03/02/2019  . Acute respiratory failure with hypoxia (HCC) 02/22/2019  . Atrial fibrillation with RVR (HCC) 02/26/2019  . COVID-19 02/22/2019  . Flu-like symptoms 02/22/2019  . Hypertension   . Knee osteoarthritis 10/24/2017    Encounter Details: Visit to client for medication teaching. Client able to fill pill box with minimal prompts. Return visit on Monday. Shann Medal RN BSN CNP [phone]

## 2019-10-16 NOTE — Progress Notes (Signed)
Patient ID: [name] [name], adult   DOB: 1951/01/20, 69 y.o.   MRN: [id number]   Message received that patient needs a refill of protonix- refill will be sent to pharmacy

## 2019-10-17 ENCOUNTER — Telehealth: Payer: Self-pay

## 2019-10-17 LAB — CYTOLOGY - PAP
Comment: NEGATIVE
High risk HPV: POSITIVE — AB

## 2019-10-17 NOTE — Telephone Encounter (Addendum)
[name] [name], caregiver, informed pap results-LSIL, HPV+, needs colposcopy per Dr. Jolayne Panther. Shann Medal, RN(Congregational Nurse),also informed results and recommendations. Information forwarded to [facility] to schedule colposcopy.  ----- Message from Priscille Heidelberg, RN sent at 10/17/2019 11:53 AM EDT ----- Patient needs colpo

## 2019-10-21 NOTE — Progress Notes (Deleted)
CARDIOLOGY OFFICE NOTE  Date:  10/21/2019    [name] [name] Date of Birth: 02-12-1950 Medical Record #[id number]  PCP:  Cain Saupe, MD  Cardiologist:  Senaida Ores chief complaint on file.   History of Present Illness: [name] [name] is a 69 y.o. adult who presents today for a pre op visit. Seen for Dr. Mayford Knife.   She has a history of PAF, HTN, CKD and arthritis. Seen back in January in the hospital by Dr. Mayford Knife with AF with RVR - in the setting of COVID pneumonia. Eliquis was started. EF normal. Mild MR.   Seen by Dr. Clifton James in April. Was doing well.   Now needing pre op clearance for knee surgery - this had already been addressed from July along with Eliquis.   Comes in today. Here with   Past Medical History:  Diagnosis Date  . Acute kidney injury (HCC) 03/02/2019  . Acute respiratory failure with hypoxia (HCC) 02/22/2019  . Atrial fibrillation with RVR (HCC) 02/26/2019  . COVID-19 02/22/2019  . Flu-like symptoms 02/22/2019  . Hypertension   . Knee osteoarthritis 10/24/2017    Past Surgical History:  Procedure Laterality Date  . NO PAST SURGERIES       Medications: No outpatient medications have been marked as taking for the 10/22/19 encounter (Appointment) with Rosalio Macadamia, NP.     Allergies: No Known Allergies  Social History: The patient  reports that she has never smoked. She has never used smokeless tobacco. She reports that she does not drink alcohol and does not use drugs.   Family History: The patient's Family history is unknown by patient.   Review of Systems: Please see the history of present illness.   All other systems are reviewed and negative.   Physical Exam: VS:  There were no vitals taken for this visit. Marland Kitchen  BMI There is no height or weight on file to calculate BMI.  Wt Readings from Last 3 Encounters:  10/15/19 215 lb 12.8 oz (97.9 kg)  10/11/19 218 lb (98.9 kg)  08/08/19 210 lb (95.3 kg)    General:  Pleasant. Well developed, well nourished and in no acute distress.   HEENT: Normal.  Neck: Supple, no JVD, carotid bruits, or masses noted.  Cardiac: Regular rate and rhythm. No murmurs, rubs, or gallops. No edema.  Respiratory:  Lungs are clear to auscultation bilaterally with normal work of breathing.  GI: Soft and nontender.  MS: No deformity or atrophy. Gait and ROM intact.  Skin: Warm and dry. Color is normal.  Neuro:  Strength and sensation are intact and no gross focal deficits noted.  Psych: Alert, appropriate and with normal affect.   LABORATORY DATA:  EKG:  EKG is ordered today.  Personally reviewed by me. This demonstrates .  Lab Results  Component Value Date   WBC 5.7 10/11/2019   HGB 12.9 10/11/2019   HCT 41.7 10/11/2019   PLT 123 (L) 10/11/2019   GLUCOSE 75 10/11/2019   CHOL 211 (H) 08/08/2018   TRIG 102 02/22/2019   HDL 45 08/08/2018   LDLCALC 150 (H) 08/08/2018   ALT 17 03/20/2019   AST 15 03/20/2019   NA 143 10/11/2019   K 4.3 10/11/2019   CL 107 (H) 10/11/2019   CREATININE 1.06 (H) 10/11/2019   BUN 16 10/11/2019   CO2 23 10/11/2019   TSH 0.956 10/11/2019   HGBA1C 5.3 10/11/2019     BNP (last 3 results) Recent Labs  02/22/19 1620  BNP 190.4*    ProBNP (last 3 results) No results for input(s): PROBNP in the last 8760 hours.   Other Studies Reviewed Today:  ECHO IMPRESSIONS 02/2019   1. Left ventricular ejection fraction, by visual estimation, is 60 to  65%. Left ventricular septal wall thickness was moderately increased.  Moderately increased left ventricular posterior wall thickness. There is  moderately increased left ventricular  wall thickness.  2. Left atrial size was severely dilated.  3. Right atrial size was mildly dilated.  4. Mild mitral valve regurgitation.  5. Tricuspid valve regurgitation is mild.  6. Tricuspid valve regurgitation is mild.  7. Mildly elevated pulmonary artery systolic pressure.  8. The inferior  vena cava is dilated in size with >50% respiratory  variability, suggesting right atrial pressure of 8 mmHg.   Assessment/Plan:  1. Pre op clearance -   2. PAF  3. Chronic diastolic dysfunction  4. Chronic anticoagulation   Current medicines are reviewed with the patient today.  The patient does not have concerns regarding medicines other than what has been noted above.  The following changes have been made:  See above.  Labs/ tests ordered today include:   No orders of the defined types were placed in this encounter.    Disposition:   FU with *** in {gen number [id number]} {Days to years:10300}.   Patient is agreeable to this plan and will call if any problems develop in the interim.   SignedNorma Fredrickson, NP  10/21/2019 7:31 AM  [facility] [address] [place], [place]  [address] Phone: [phone] Fax: [phone]

## 2019-10-22 ENCOUNTER — Ambulatory Visit
Admission: RE | Admit: 2019-10-22 | Discharge: 2019-10-22 | Disposition: A | Discharge status: Home / Self Care | Payer: No Typology Code available for payment source | Source: Ambulatory Visit | Attending: Obstetrics and Gynecology | Admitting: Obstetrics and Gynecology

## 2019-10-22 ENCOUNTER — Ambulatory Visit: Payer: Self-pay | Admitting: Nurse Practitioner

## 2019-10-22 ENCOUNTER — Other Ambulatory Visit: Payer: Self-pay

## 2019-10-22 ENCOUNTER — Other Ambulatory Visit: Payer: Self-pay | Admitting: Obstetrics and Gynecology

## 2019-10-22 DIAGNOSIS — R928 Other abnormal and inconclusive findings on diagnostic imaging of breast: Secondary | ICD-10-CM

## 2019-10-23 ENCOUNTER — Encounter: Payer: Self-pay | Admitting: Pediatric Intensive Care

## 2019-10-23 NOTE — Congregational Nurse Program (Signed)
  Dept: [place] Nurse Program Note  Date of Encounter: 10/23/2019  Past Medical History: Past Medical History:  Diagnosis Date  . Acute kidney injury ([place]) 03/02/2019  . Acute respiratory failure with hypoxia ([place]) 02/22/2019  . Atrial fibrillation with RVR ([place]) 02/26/2019  . COVID-19 02/22/2019  . Flu-like symptoms 02/22/2019  . Hypertension   . Knee osteoarthritis 10/24/2017    Encounter Details: Met with client for medication education. [place] interpretation was unavailable. [place] interpretation via [employer] [phone]. [place] was able to fill medication box with minimal prompts. CN explained to client that CN had received a call regarding the PAP smear results. 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 Client states that she understands. Follow up in 1 week. Lisette Abu RN BSN CNP [phone]

## 2019-10-24 ENCOUNTER — Other Ambulatory Visit: Payer: Self-pay

## 2019-10-24 ENCOUNTER — Ambulatory Visit (INDEPENDENT_AMBULATORY_CARE_PROVIDER_SITE_OTHER): Payer: Self-pay | Admitting: Physician Assistant

## 2019-10-24 ENCOUNTER — Encounter: Payer: Self-pay | Admitting: Physician Assistant

## 2019-10-24 VITALS — BP 140/80 | HR 70 | Ht 62.0 in | Wt 215.0 lb

## 2019-10-24 DIAGNOSIS — E669 Obesity, unspecified: Secondary | ICD-10-CM

## 2019-10-24 DIAGNOSIS — E782 Mixed hyperlipidemia: Secondary | ICD-10-CM

## 2019-10-24 DIAGNOSIS — Z01810 Encounter for preprocedural cardiovascular examination: Secondary | ICD-10-CM

## 2019-10-24 DIAGNOSIS — I480 Paroxysmal atrial fibrillation: Secondary | ICD-10-CM

## 2019-10-24 DIAGNOSIS — E1169 Type 2 diabetes mellitus with other specified complication: Secondary | ICD-10-CM

## 2019-10-24 DIAGNOSIS — I10 Essential (primary) hypertension: Secondary | ICD-10-CM

## 2019-10-24 NOTE — Progress Notes (Signed)
Cardiology Office Note    Date:  10/24/2019   ID:  [name], [name] 10-03-1950, MRN [id number]  PCP:  Cain Saupe, MD  Cardiologist: Dr. Mayford Knife Chief Complaint: Surgical clearance for right knee arthroplasty  History of Present Illness:   [name] [name] is a 69 y.o. adult with history of paroxysmal atrial fibrillation, hypertension, CKD and arthritis seen for surgical clearance.  Admitted January 2021 for new onset atrial fibrillation with rapid ventricular rate in setting of COVID-19 pneumonia.  Placed on Eliquis for anticoagulation.  Echo with LV function of 60 to 65%, mild MR.  She was seen by Dr. Mayford Knife during admission.  Patient was seen in clinic April 2021 by Dr. Clifton James.  Recommended continuation of Cardizem and Eliquis given creatinine score of 3.  Seen for surgical clearance with help of in-house [name] interpreter.  Previously cleared for similar surgery in July however it was canceled due to miscommunication.  Now planning to do again.  Patient denies chest pain, shortness of breath, orthopnea, PND, syncope, lower extremity edema or melena.  Uses cane for walking intermittently.  She does yard work and walk without any shortness of breath or chest discomfort.  Limited activity due to knee pain but no cardiac complaint.     Past Medical History:  Diagnosis Date  . Acute kidney injury (HCC) 03/02/2019  . Acute respiratory failure with hypoxia (HCC) 02/22/2019  . Atrial fibrillation with RVR (HCC) 02/26/2019  . COVID-19 02/22/2019  . Flu-like symptoms 02/22/2019  . Hypertension   . Knee osteoarthritis 10/24/2017    Past Surgical History:  Procedure Laterality Date  . NO PAST SURGERIES      Current Medications: Prior to Admission medications   Medication Sig Start Date End Date Taking? Authorizing Provider  acetaminophen-codeine (TYLENOL #3) 300-30 MG tablet Take 1 tablet by mouth every 6 (six) hours as needed for moderate pain. Patient taking  differently: Take 1 tablet by mouth every 6 (six) hours as needed (arthritis pain).  08/08/18   Fulp, Cammie, MD  amLODipine (NORVASC) 2.5 MG tablet Take 2 tablets (5 mg total) by mouth daily. To lower blood pressure 03/19/19   Fulp, Cammie, MD  apixaban (ELIQUIS) 5 MG TABS tablet TAKE 1 TABLET (5 MG TOTAL) BY MOUTH 2 (TWO) TIMES DAILY. 10/08/19   Fulp, Cammie, MD  ascorbic acid (VITAMIN C) 500 MG tablet Take 1 tablet (500 mg total) by mouth 2 (two) times daily. 03/05/19   Swayze, Ava, DO  diclofenac sodium (VOLTAREN) 1 % GEL Apply 4 g topically 4 (four) times daily. Apply to feet 4 times daily PRN for pain. Patient taking differently: Apply 4 g topically 4 (four) times daily as needed (knee pain).  09/26/18   Fulp, Cammie, MD  diltiazem (CARDIZEM CD) 120 MG 24 hr capsule Take 1 capsule (120 mg total) by mouth daily. 03/05/19 03/04/20  Swayze, Ava, DO  furosemide (LASIX) 20 MG tablet Take 1 tablet (20 mg total) by mouth daily as needed. Prn swelling 05/16/19   Anders Simmonds, PA-C  gabapentin (NEURONTIN) 100 MG capsule TAKE 1 CAPSULE (100 MG TOTAL) BY MOUTH 3 (THREE) TIMES DAILY. 08/27/19   Fulp, Cammie, MD  glipiZIDE (GLUCOTROL) 5 MG tablet Take 0.5 tablets (2.5 mg total) by mouth daily before breakfast. For high blood sugar 07/22/19   Marcine Matar, MD  Ipratropium-Albuterol (COMBIVENT) 20-100 MCG/ACT AERS respimat Inhale 2 puffs into the lungs 3 (three) times daily. 03/05/19   Swayze, Ava, DO  loratadine (CLARITIN) 10  MG tablet Take 1 tablet (10 mg total) by mouth daily. 03/05/19   Swayze, Ava, DO  MAGNESIUM PO Take 1 tablet by mouth daily.    [provider]  pantoprazole (PROTONIX) 40 MG tablet Take 1 tablet (40 mg total) by mouth daily. To reduce stomach acid 10/16/19   Fulp, Cammie, MD  rosuvastatin (CRESTOR) 10 MG tablet Take 1 tablet (10 mg total) by mouth daily. To lower cholesterol 07/22/19   Marcine Matar, MD  triamcinolone cream (KENALOG) 0.1 % APPLY TO DRY SKIN 1-2 TIMES DAILY SPARINGLY  08/20/19   Fulp, Cammie, MD  zinc sulfate 220 (50 Zn) MG capsule Take 1 capsule (220 mg total) by mouth daily. 03/05/19   Swayze, Ava, DO    Allergies:   Patient has no known allergies.   Social History   Socioeconomic History  . Marital status: Widowed    Spouse name: Not on file  . Number of children: Not on file  . Years of education: Not on file  . Highest education level: Not on file  Occupational History  . Not on file  Tobacco Use  . Smoking status: Never Smoker  . Smokeless tobacco: Never Used  Vaping Use  . Vaping Use: Never used  Substance and Sexual Activity  . Alcohol use: Never  . Drug use: Never  . Sexual activity: Not Currently    Birth control/protection: Post-menopausal  Other Topics Concern  . Not on file  Social History Narrative  . Not on file   Social Determinants of Health   Financial Resource Strain:   . Difficulty of Paying Living Expenses: Not on file  Food Insecurity:   . Worried About Programme researcher, broadcasting/film/video in the Last Year: Not on file  . Ran Out of Food in the Last Year: Not on file  Transportation Needs: No Transportation Needs  . Lack of Transportation (Medical): No  . Lack of Transportation (Non-Medical): No  Physical Activity:   . Days of Exercise per Week: Not on file  . Minutes of Exercise per Session: Not on file  Stress:   . Feeling of Stress : Not on file  Social Connections:   . Frequency of Communication with Friends and Family: Not on file  . Frequency of Social Gatherings with Friends and Family: Not on file  . Attends Religious Services: Not on file  . Active Member of Clubs or Organizations: Not on file  . Attends Banker Meetings: Not on file  . Marital Status: Not on file     Family History:  The patient's Family history is unknown by patient.   ROS:   Please see the history of present illness.    ROS All other systems reviewed and are negative.   PHYSICAL EXAM:   VS:  BP 140/80   Pulse 70   Ht 5\' 2"   (1.575 m)   Wt 215 lb (97.5 kg)   SpO2 96%   BMI 39.32 kg/m    GEN: Well nourished, well developed, in no acute distress  HEENT: normal  Neck: no JVD, carotid bruits, or masses Cardiac: RRR; no murmurs, rubs, or gallops,no edema  Respiratory:  clear to auscultation bilaterally, normal work of breathing GI: soft, nontender, nondistended, + BS MS: no deformity or atrophy  Skin: warm and dry, no rash Neuro:  Alert and Oriented x 3, Strength and sensation are intact Psych: euthymic mood, full affect  Wt Readings from Last 3 Encounters:  10/24/19 215 lb (97.5 kg)  10/15/19 215 lb 12.8 oz (97.9 kg)  10/11/19 218 lb (98.9 kg)      Studies/Labs Reviewed:   EKG:  EKG is ordered today.  The ekg ordered today demonstrates normal sinus rhythm at rate of 70 bpm  Recent Labs: 02/22/2019: B Natriuretic Peptide 190.4 02/28/2019: Magnesium 2.2 03/20/2019: ALT 17 10/11/2019: BUN 16; Creatinine, Ser 1.06; Hemoglobin 12.9; Platelets 123; Potassium 4.3; Sodium 143; TSH 0.956   Lipid Panel    Component Value Date/Time   CHOL 211 (H) 08/08/2018 1145   TRIG 102 02/22/2019 1703   HDL 45 08/08/2018 1145   CHOLHDL 4.7 (H) 08/08/2018 1145   LDLCALC 150 (H) 08/08/2018 1145    Additional studies/ records that were reviewed today include:   Echocardiogram: 02/2019 1. Left ventricular ejection fraction, by visual estimation, is 60 to  65%. Left ventricular septal wall thickness was moderately increased.  Moderately increased left ventricular posterior wall thickness. There is  moderately increased left ventricular  wall thickness.  2. Left atrial size was severely dilated.  3. Right atrial size was mildly dilated.  4. Mild mitral valve regurgitation.  5. Tricuspid valve regurgitation is mild.  6. Tricuspid valve regurgitation is mild.  7. Mildly elevated pulmonary artery systolic pressure.  8. The inferior vena cava is dilated in size with >50% respiratory  variability, suggesting right  atrial pressure of 8 mmHg.    ASSESSMENT & PLAN:    1. Paroxysmal atrial fibrillation Maintaining sinus rhythm.  Continue Eliquis and Cardizem.  2.  Hypertension -Blood pressure relatively stable.  No change.  3.  Diabetes mellitus -Followed by PCP  4.  Hyperlipidemia -Continue Crestor.  Followed by PCP  5.  Surgical clearance -Limited activity due to knee pain but able to get at least 4 METS of activity without cardiac symptoms. Given past medical history and time since last visit, based on ACC/AHA guidelines, [name] [name] would be at acceptable risk for the planned procedure without further cardiovascular testing.   The patient was advised that if she develops new symptoms prior to surgery to contact our office to arrange for a follow-up visit, and she verbalized understanding.  From prior pharmacy recommendations:  "Patient with diagnosis of PAF on Eliquis for anticoagulation.    Procedure: Right total knee athroplasty Date of procedure: TBD  CHADS2-VASc score of  5 (CHF, HTN, AGE, DM2,  female)  CrCl 63 mL/min using adjusted body weight  Per office protocol, patient can hold Eliquis for 3 days prior to procedure.    Patient will not need bridging with Lovenox (enoxaparin) around procedure.  If not bridging, patient should restart Eliquis on the evening of procedure or day after, at discretion of procedure MD  For orthopedic procedures please be sure to resume therapeutic (not prophylactic) dosing".   I will route this recommendation to the requesting party via Epic fax function and remove from pre-op pool.  Please call with questions.  [place], [place] 10/24/2019, 10:12 AM    Medication Adjustments/Labs and Tests Ordered: Current medicines are reviewed at length with the patient today.  Concerns regarding medicines are outlined above.  Medication changes, Labs and Tests ordered today are listed in the Patient Instructions below. Patient  Instructions  Medication Instructions:  Your physician recommends that you continue on your current medications as directed. Please refer to the Current Medication list given to you today.  *If you need a refill on your cardiac medications before your next appointment, please call your pharmacy*   Lab Work:  None ordered  If you have labs (blood work) drawn today and your tests are completely normal, you will receive your results only by: Marland Kitchen. MyChart Message (if you have MyChart) OR . A paper copy in the mail If you have any lab test that is abnormal or we need to change your treatment, we will call you to review the results.   Testing/Procedures: None ordered   Follow-Up: At [facility], you and your health needs are our priority.  As part of our continuing mission to provide you with exceptional heart care, we have created designated Provider Care Teams.  These Care Teams include your primary Cardiologist (physician) and Advanced Practice Providers (APPs -  Physician Assistants and Nurse Practitioners) who all work together to provide you with the care you need, when you need it.  We recommend signing up for the patient portal called "MyChart".  Sign up information is provided on this After Visit Summary.  MyChart is used to connect with patients for Virtual Visits (Telemedicine).  Patients are able to view lab/test results, encounter notes, upcoming appointments, etc.  Non-urgent messages can be sent to your provider as well.   To learn more about what you can do with MyChart, go to [email or web address].    Your next appointment:   12 month(s)  The format for your next appointment:   In Person  Provider:   You may see Verne Carrowhristopher McAlhany, MD or one of the following Advanced Practice Providers on your designated Care Team:    Ronie Spiesayna Dunn, PA-C  Jacolyn ReedyMichele Lenze, PA-C    Other Instructions       Signed, Manson PasseyBhavinkumar Korey Prashad, [place]  10/24/2019 10:11 AM    [facility] [address], [place], [place]  [address] Phone: [phone]; Fax: [phone]

## 2019-10-24 NOTE — Patient Instructions (Signed)
Medication Instructions:  Your physician recommends that you continue on your current medications as directed. Please refer to the Current Medication list given to you today.  *If you need a refill on your cardiac medications before your next appointment, please call your pharmacy*   Lab Work: None ordered  If you have labs (blood work) drawn today and your tests are completely normal, you will receive your results only by: . MyChart Message (if you have MyChart) OR . A paper copy in the mail If you have any lab test that is abnormal or we need to change your treatment, we will call you to review the results.   Testing/Procedures: None ordered   Follow-Up: At [facility], you and your health needs are our priority.  As part of our continuing mission to provide you with exceptional heart care, we have created designated Provider Care Teams.  These Care Teams include your primary Cardiologist (physician) and Advanced Practice Providers (APPs -  Physician Assistants and Nurse Practitioners) who all work together to provide you with the care you need, when you need it.  We recommend signing up for the patient portal called "MyChart".  Sign up information is provided on this After Visit Summary.  MyChart is used to connect with patients for Virtual Visits (Telemedicine).  Patients are able to view lab/test results, encounter notes, upcoming appointments, etc.  Non-urgent messages can be sent to your provider as well.   To learn more about what you can do with MyChart, go to [email or web address].    Your next appointment:   12 month(s)  The format for your next appointment:   In Person  Provider:   You may see Christopher McAlhany, MD or one of the following Advanced Practice Providers on your designated Care Team:    Dayna Dunn, PA-C  Michele Lenze, PA-C    Other Instructions   

## 2019-10-25 NOTE — Addendum Note (Signed)
Addended by: Burnetta Sabin on: 10/25/2019 01:28 PM   Modules accepted: Orders

## 2019-11-05 ENCOUNTER — Other Ambulatory Visit: Payer: Self-pay | Admitting: Family Medicine

## 2019-11-05 ENCOUNTER — Telehealth: Payer: Self-pay | Admitting: Pediatric Intensive Care

## 2019-11-05 DIAGNOSIS — M79672 Pain in left foot: Secondary | ICD-10-CM

## 2019-11-05 DIAGNOSIS — M545 Low back pain, unspecified: Secondary | ICD-10-CM

## 2019-11-05 DIAGNOSIS — M79671 Pain in right foot: Secondary | ICD-10-CM

## 2019-11-13 ENCOUNTER — Other Ambulatory Visit: Payer: Self-pay | Admitting: Family Medicine

## 2019-11-13 DIAGNOSIS — M79672 Pain in left foot: Secondary | ICD-10-CM

## 2019-11-13 DIAGNOSIS — M79671 Pain in right foot: Secondary | ICD-10-CM

## 2019-11-13 DIAGNOSIS — M545 Low back pain, unspecified: Secondary | ICD-10-CM

## 2019-11-13 MED ORDER — GABAPENTIN 100 MG PO CAPS: 100.0000 mg | ORAL_CAPSULE | Freq: Three times a day (TID) | ORAL | 3 refills | Status: DC

## 2019-11-13 NOTE — Progress Notes (Signed)
Patient ID: [name] [name], adult   DOB: 1950/05/24, 69 y.o.   MRN: [id number]  Message received that patient needs refill of gabapentin.  90-day supply with 3 [facility]

## 2019-11-19 NOTE — Telephone Encounter (Signed)
Left message for Dr Diamantina [facility] nurse, Eunice Blase, to clarify if and when client will need to hold eliquis prior to surgery. Shann Medal Rn BSN CNP [phone]

## 2019-11-21 ENCOUNTER — Ambulatory Visit: Payer: Self-pay | Admitting: Family Medicine

## 2019-11-27 NOTE — Pre-Procedure Instructions (Signed)
[facility] [place], [place] - [place] [address] [place] [address] Phone: [phone] Fax: [phone]     Your procedure is scheduled on Tuesday, November 2nd, from 07:30 AM- 10:09 AM.  Report to [facility] Entr[facility] "A" at 05:30 A.M., and check in at the Admitting office.  Call this number if you have problems the morning of surgery:  [phone]  Call [phone] if you have any questions prior to your surgery date Monday-Friday 8am-4pm.    Remember:  Do not eat after midnight the night before your surgery.  You may drink clear liquids until 04:30 AM the morning of your surgery.   Clear liquids allowed are: Water, Non-Citrus Juices (without pulp), Carbonated Beverages, Clear Tea, Black Coffee Only, and Gatorade.  *Please complete your PRE-SURGERY 10 oz Water that was provided to you by 04:30 AM the morning of surgery.  Please, if able, drink it in one setting. DO NOT SIP.     Take these medicines the morning of surgery with A SIP OF WATER: amLODipine (NORVASC) diltiazem (CARDIZEM CD) gabapentin (NEURONTIN) loratadine (CLARITIN) pantoprazole (PROTONIX) rosuvastatin (CRESTOR) Ipratropium-Albuterol (COMBIVENT) respimat- inhaler  IF NEEDED: acetaminophen-codeine (TYLENOL #3)   *STOP apixaban (ELIQUIS) 3 days prior to surgery.  As of today, STOP taking any Aspirin (unless otherwise instructed by your surgeon), NSAIDs such as diclofenac sodium (VOLTAREN) GEL, Aleve, Naproxen, Ibuprofen, Motrin, Advil, Goody's, BC's, all herbal medications, fish oil, and all vitamins.   WHAT DO I DO ABOUT MY DIABETES MEDICATION?  THE MORNING OF SURGERY: . Do NOT take glipiZIDE (GLUCOTROL).   HOW TO MANAGE YOUR DIABETES BEFORE AND AFTER SURGERY  Why is it important to control my blood sugar before and after surgery? . Improving blood sugar levels before and after surgery helps healing and can limit problems. . A way of improving blood  sugar control is eating a healthy diet by: o  Eating less sugar and carbohydrates o  Increasing activity/exercise o  Talking with your doctor about reaching your blood sugar goals . High blood sugars (greater than 180 mg/dL) can raise your risk of infections and slow your recovery, so you will need to focus on controlling your diabetes during the weeks before surgery. . Make sure that the doctor who takes care of your diabetes knows about your planned surgery including the date and location.  How do I manage my blood sugar before surgery? . Check your blood sugar at least 4 times a day, starting 2 days before surgery, to make sure that the level is not too high or low. . Check your blood sugar the morning of your surgery when you wake up and every 2 hours until you get to the Short Stay unit. o If your blood sugar is less than 70 mg/dL, you will need to treat for low blood sugar: - Do not take insulin. - Treat a low blood sugar (less than 70 mg/dL) with  cup of clear juice (cranberry or apple), 4 glucose tablets, OR glucose gel. - Recheck blood sugar in 15 minutes after treatment (to make sure it is greater than 70 mg/dL). If your blood sugar is not greater than 70 mg/dL on recheck, call [phone] for further instructions. . Report your blood sugar to the short stay nurse when you get to Short Stay.  . If you are admitted to the hospital after surgery: o Your blood sugar will be checked by the staff and you will probably be given insulin after  surgery (instead of oral diabetes medicines) to make sure you have good blood sugar levels. o The goal for blood sugar control after surgery is 80-180 mg/dL.   The Morning of Surgery:                    Do not wear jewelry, make up, or nail polish.            Do not wear lotions, powders, perfumes, or deodorant.            Do not shave 48 hours prior to surgery.              Do not bring valuables to the hospital.            [facility] is not  responsible for any belongings or valuables.  Do NOT Smoke (Tobacco/Vaping) or drink Alcohol 24 hours prior to your procedure.  If you use a CPAP at night, you may bring all equipment for your overnight stay.   Contacts, glasses, dentures or bridgework may not be worn into surgery.      For patients admitted to the hospital, discharge time will be determined by your treatment team.   Patients discharged the day of surgery will not be allowed to drive home, and someone needs to stay with them for 24 hours.    Special instructions:   [facility]- Preparing For Surgery  Before surgery, you can play an important role. Because skin is not sterile, your skin needs to be as free of germs as possible. You can reduce the number of germs on your skin by washing with CHG (chlorahexidine gluconate) Soap before surgery.  CHG is an antiseptic cleaner which kills germs and bonds with the skin to continue killing germs even after washing.    Oral Hygiene is also important to reduce your risk of infection.  Remember - BRUSH YOUR TEETH THE MORNING OF SURGERY WITH YOUR REGULAR TOOTHPASTE  Please do not use if you have an allergy to CHG or antibacterial soaps. If your skin becomes reddened/irritated stop using the CHG.  Do not shave (including legs and underarms) for at least 48 hours prior to first CHG shower. It is OK to shave your face.  Please follow these instructions carefully.   1. Shower the NIGHT BEFORE SURGERY and the MORNING OF SURGERY with CHG Soap.   2. If you chose to wash your hair, wash your hair first as usual with your normal shampoo.  3. After you shampoo, rinse your hair and body thoroughly to remove the shampoo.  4. Use CHG as you would any other liquid soap. You can apply CHG directly to the skin and wash gently with a scrungie or a clean washcloth.   5. Apply the CHG Soap to your body ONLY FROM THE NECK DOWN.  Do not use on open wounds or open sores. Avoid contact with your eyes,  ears, mouth and genitals (private parts). Wash Face and genitals (private parts)  with your normal soap.   6. Wash thoroughly, paying special attention to the area where your surgery will be performed.  7. Thoroughly rinse your body with warm water from the neck down.  8. DO NOT shower/wash with your normal soap after using and rinsing off the CHG Soap.  9. Pat yourself dry with a CLEAN TOWEL.  10. Wear CLEAN PAJAMAS to bed the night before surgery  11. Place CLEAN SHEETS on your bed the night of your first shower and  DO NOT SLEEP WITH PETS.   Day of Surgery: SHOWER Wear Clean/Comfortable clothing the morning of surgery Do not apply any deodorants/lotions.   Remember to brush your teeth WITH YOUR REGULAR TOOTHPASTE.   Please read over the following fact sheets that you were given.

## 2019-11-28 ENCOUNTER — Other Ambulatory Visit: Payer: Self-pay | Admitting: Family Medicine

## 2019-11-28 ENCOUNTER — Encounter (HOSPITAL_COMMUNITY): Payer: Self-pay

## 2019-11-28 ENCOUNTER — Telehealth: Payer: Self-pay | Admitting: Orthopedic Surgery

## 2019-11-28 ENCOUNTER — Other Ambulatory Visit: Payer: Self-pay

## 2019-11-28 ENCOUNTER — Encounter (HOSPITAL_COMMUNITY)
Admission: RE | Admit: 2019-11-28 | Discharge: 2019-11-28 | Disposition: A | Discharge status: Home / Self Care | Payer: No Typology Code available for payment source | Source: Ambulatory Visit | Attending: Surgical | Admitting: Surgical

## 2019-11-28 DIAGNOSIS — Z01812 Encounter for preprocedural laboratory examination: Secondary | ICD-10-CM | POA: Insufficient documentation

## 2019-11-28 LAB — CBC
HCT: 41.9 % (ref 36.0–46.0)
Hemoglobin: 13.1 g/dL (ref 12.0–15.0)
MCH: 24 pg — ABNORMAL LOW (ref 26.0–34.0)
MCHC: 31.3 g/dL (ref 30.0–36.0)
MCV: 76.9 fL — ABNORMAL LOW (ref 80.0–100.0)
Platelets: 120 10*3/uL — ABNORMAL LOW (ref 150–400)
RBC: 5.45 MIL/uL — ABNORMAL HIGH (ref 3.87–5.11)
RDW: 17.6 % — ABNORMAL HIGH (ref 11.5–15.5)
WBC: 5.6 10*3/uL (ref 4.0–10.5)
nRBC: 0 % (ref 0.0–0.2)

## 2019-11-28 LAB — BASIC METABOLIC PANEL
Anion gap: 10 (ref 5–15)
BUN: 16 mg/dL (ref 8–23)
CO2: 23 mmol/L (ref 22–32)
Calcium: 9.1 mg/dL (ref 8.9–10.3)
Chloride: 109 mmol/L (ref 98–111)
Creatinine, Ser: 0.99 mg/dL (ref 0.44–1.00)
GFR, Estimated: >60 mL/min (ref >60)
Glucose, Bld: 80 mg/dL (ref 70–99)
Potassium: 4 mmol/L (ref 3.5–5.1)
Sodium: 142 mmol/L (ref 135–145)

## 2019-11-28 LAB — URINALYSIS, ROUTINE W REFLEX MICROSCOPIC
Bilirubin Urine: NEGATIVE
Glucose, UA: NEGATIVE mg/dL
Hgb urine dipstick: NEGATIVE
Ketones, ur: NEGATIVE mg/dL
Leukocytes,Ua: NEGATIVE
Nitrite: NEGATIVE
Protein, ur: NEGATIVE mg/dL
Specific Gravity, Urine: 1.003 — ABNORMAL LOW (ref 1.005–1.030)
pH: 7 (ref 5.0–8.0)

## 2019-11-28 LAB — SURGICAL PCR SCREEN
MRSA, PCR: NEGATIVE
Staphylococcus aureus: NEGATIVE

## 2019-11-28 LAB — GLUCOSE, CAPILLARY
Glucose-Capillary: 68 mg/dL — ABNORMAL LOW (ref 70–99)
Glucose-Capillary: 108 mg/dL — ABNORMAL HIGH (ref 70–99)

## 2019-11-28 NOTE — Progress Notes (Signed)
PCP - Dr. Cain Saupe Cardiologist - Dr. Iver Nestle  Chest x-ray - 03/20/19 EKG - 10/24/19 Stress Test - denies ECHO - 02/28/19 Cardiac Cath - denies  Sleep Study - denies CPAP - denies  CBG at PAT 68. Pt had not eaten since 0600. Pt provided with one pack or graham crackers and peanut butter. Recheck: 108 Last A1C on 10/11/19 was 5.3 Pt does not check CBG at home.   Blood Thinner Instructions: Stop Eliquis 3 days prior to surgery. LD 11/30/19 Aspirin Instructions:N/A  ERAS Protcol -yes PRE-SURGERY Ensure or G2- Pt given 10oz bottle of water.  COVID TEST- 11/29/19   Anesthesia review: yes, cardiac clearance on 10/24/19 in Epic.   Patient denies shortness of breath, fever, cough and chest pain at PAT appointment   All instructions explained to the patient, with a verbal understanding of the material. Patient agrees to go over the instructions while at home for a better understanding. Patient also instructed to self quarantine after being tested for COVID-19. The opportunity to ask questions was provided.    Coronavirus Screening  Have you experienced the following symptoms:  Cough yes/no: No Fever (>100.18F)  yes/no: No Runny nose yes/no: No Sore throat yes/no: No Difficulty breathing/shortness of breath  yes/no: No  Have you or a family member traveled in the last 14 days and where? yes/no: No   If the patient indicates "YES" to the above questions, their PAT will be rescheduled to limit the exposure to others and, the surgeon will be notified. THE PATIENT WILL NEED TO BE ASYMPTOMATIC FOR 14 DAYS.   If the patient is not experiencing any of these symptoms, the PAT nurse will instruct them to NOT bring anyone with them to their appointment since they may have these symptoms or traveled as well.   Please remind your patients and families that hospital visitation restrictions are in effect and the importance of the restrictions.

## 2019-11-28 NOTE — Telephone Encounter (Signed)
Lequita Halt at [facility] [phone] called to let you know patient's platelet count is low (120)   Patient is scheduled for right total knee on 12-03-19 @[facility] 7:30am

## 2019-11-28 NOTE — Progress Notes (Signed)
Spoke with Eunice Blase at Dr. Diamant[facility] office regarding platelets-120 on CBC. Message will be relayed to surgeon.   Viviano Simas, RN

## 2019-11-29 ENCOUNTER — Other Ambulatory Visit (HOSPITAL_COMMUNITY)
Admission: RE | Admit: 2019-11-29 | Discharge: 2019-11-29 | Disposition: A | Discharge status: Home / Self Care | Payer: HRSA Program | Source: Ambulatory Visit | Attending: Orthopedic Surgery | Admitting: Orthopedic Surgery

## 2019-11-29 DIAGNOSIS — Z20822 Contact with and (suspected) exposure to covid-19: Secondary | ICD-10-CM | POA: Diagnosis not present

## 2019-11-29 DIAGNOSIS — Z01812 Encounter for preprocedural laboratory examination: Secondary | ICD-10-CM | POA: Diagnosis present

## 2019-11-29 LAB — URINE CULTURE

## 2019-11-29 NOTE — Anesthesia Preprocedure Evaluation (Addendum)
Anesthesia Evaluation  Patient identified by MRN, date of birth, ID band Patient awake    Reviewed: Allergy & Precautions, NPO status , Patient's Chart, lab work & pertinent test results  Airway Mallampati: II  TM Distance: >3 FB Neck ROM: Full    Dental no notable dental hx.    Pulmonary neg pulmonary ROS,    Pulmonary exam normal breath sounds clear to auscultation       Cardiovascular hypertension, Normal cardiovascular exam+ dysrhythmias Atrial Fibrillation  Rhythm:Regular Rate:Normal     Neuro/Psych negative neurological ROS  negative psych ROS   GI/Hepatic negative GI ROS, Neg liver ROS,   Endo/Other  diabetes  Renal/GU negative Renal ROS  negative genitourinary   Musculoskeletal negative musculoskeletal ROS (+)   Abdominal   Peds negative pediatric ROS (+)  Hematology negative hematology ROS (+)   Anesthesia Other Findings   Reproductive/Obstetrics negative OB ROS                            Anesthesia Physical Anesthesia Plan  ASA: III  Anesthesia Plan: Spinal   Post-op Pain Management:  Regional for Post-op pain   Induction: Intravenous  PONV Risk Score and Plan: 2 and Ondansetron, Dexamethasone and Treatment may vary due to age or medical condition  Airway Management Planned: Simple Face Mask  Additional Equipment:   Intra-op Plan:   Post-operative Plan:   Informed Consent: I have reviewed the patients History and Physical, chart, labs and discussed the procedure including the risks, benefits and alternatives for the proposed anesthesia with the patient or authorized representative who has indicated his/her understanding and acceptance.     Dental advisory given  Plan Discussed with: CRNA and Surgeon  Anesthesia Plan Comments: (; Admitted January 2021 for new onset atrial fibrillation with rapid ventricular rate in setting of COVID-19 pneumonia.  Placed on  Eliquis for anticoagulation.  Echo with LV function of 60 to 65%, mild MR.  Has continued to follow outpatient with cardiology. Last seen 10/24/19 and in sinus rhythm. Preop clearance addressed. Per note, "Limited activity due to knee pain but able to get at least 4 METS of activity without cardiac symptoms. Given past medical history and time since last visit, based on ACC/AHA guidelines, [name] [name] would be at acceptable risk for the planned procedure without further cardiovascular testing."  Cleared to hold Eliquis 3d preop. Pt reported LD 11/30/19.   Hx of DMII, last A1c 5.3 on 10/11/19.  Preop labs reviewed, platelets mildly low at 120. Review of records in Epic shows similar values previously. 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 Was advised to follow up as needed and continue with PCP for monitoring. Continues to be monitored by Dr. Jillyn Hidden.  EKG 10/24/19:  Sinus rhythm. Rate 70.  TTE 02/28/19: 1. Left ventricular ejection fraction, by visual estimation, is 60 to  65%. Left ventricular septal wall thickness was moderately increased.  Moderately increased left ventricular posterior wall thickness. There is  moderately increased left ventricular  wall thickness.  2. Left atrial size was severely dilated.  3. Right atrial size was mildly dilated.  4. Mild mitral valve regurgitation.  5. Tricuspid valve regurgitation is mild.  6. Tricuspid valve regurgitation is mild.  7. Mildly elevated pulmonary artery systolic pressure.  8. The inferior vena cava is dilated in size with >50% respiratory  variability, suggesting right atrial pressure of 8 mmHg. )  Anesthesia Quick Evaluation  

## 2019-11-29 NOTE — Telephone Encounter (Signed)
See below

## 2019-11-29 NOTE — Progress Notes (Signed)
Anesthesia Chart Review:  Admitted January 2021 for new onset atrial fibrillation with rapid ventricular rate in setting of COVID-19 pneumonia.  Placed on Eliquis for anticoagulation.  Echo with LV function of 60 to 65%, mild MR.  Has continued to follow outpatient with cardiology. Last seen 10/24/19 and in sinus rhythm. Preop clearance addressed. Per note, "Limited activity due to knee pain but able to get at least 4 METS of activity without cardiac symptoms. Given past medical history and time since last visit, based on ACC/AHA guidelines, [name] [name] would be at acceptable risk for the planned procedure without further cardiovascular testing."  Cleared to hold Eliquis 3d preop. Pt reported LD 11/30/19.   Hx of DMII, last A1c 5.3 on 10/11/19.  Preop labs reviewed, platelets mildly low at 120. Review of records in Epic shows similar values previously. 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 Was advised to follow up as needed and continue with PCP for monitoring. Continues to be monitored by Dr. Jillyn Hidden.  EKG 10/24/19:  Sinus rhythm. Rate 70.  TTE 02/28/19: 1. Left ventricular ejection fraction, by visual estimation, is 60 to  65%. Left ventricular septal wall thickness was moderately increased.  Moderately increased left ventricular posterior wall thickness. There is  moderately increased left ventricular  wall thickness.  2. Left atrial size was severely dilated.  3. Right atrial size was mildly dilated.  4. Mild mitral valve regurgitation.  5. Tricuspid valve regurgitation is mild.  6. Tricuspid valve regurgitation is mild.  7. Mildly elevated pulmonary artery systolic pressure.  8. The inferior vena cava is dilated in size with >50% respiratory  variability, suggesting right atrial pressure of 8 mmHg.   [name] [name] [facility]/Anesthesiology Phone [phone] 11/29/2019  11:42 AM

## 2019-11-29 NOTE — Telephone Encounter (Signed)
120 ok

## 2019-11-30 LAB — SARS CORONAVIRUS 2 (TAT 6-24 HRS): SARS Coronavirus 2: NEGATIVE

## 2019-12-02 ENCOUNTER — Encounter: Payer: Self-pay | Admitting: Pediatric Intensive Care

## 2019-12-03 ENCOUNTER — Encounter (HOSPITAL_COMMUNITY)
Admission: RE | Disposition: A | Discharge status: Home / Self Care | Payer: Self-pay | Source: Home / Self Care | Attending: Orthopedic Surgery

## 2019-12-03 ENCOUNTER — Observation Stay (HOSPITAL_COMMUNITY): Payer: No Typology Code available for payment source

## 2019-12-03 ENCOUNTER — Encounter (HOSPITAL_COMMUNITY): Payer: Self-pay | Admitting: Orthopedic Surgery

## 2019-12-03 ENCOUNTER — Inpatient Hospital Stay (HOSPITAL_COMMUNITY): Payer: No Typology Code available for payment source | Admitting: Vascular Surgery

## 2019-12-03 ENCOUNTER — Other Ambulatory Visit: Payer: Self-pay

## 2019-12-03 ENCOUNTER — Inpatient Hospital Stay (HOSPITAL_COMMUNITY)
Admission: RE | Admit: 2019-12-03 | Discharge: 2019-12-06 | DRG: 470 | Disposition: A | Discharge status: Home / Self Care | Payer: No Typology Code available for payment source | Attending: Orthopedic Surgery | Admitting: Orthopedic Surgery

## 2019-12-03 DIAGNOSIS — Z9889 Other specified postprocedural states: Secondary | ICD-10-CM

## 2019-12-03 DIAGNOSIS — G8929 Other chronic pain: Secondary | ICD-10-CM | POA: Diagnosis present

## 2019-12-03 DIAGNOSIS — Z20822 Contact with and (suspected) exposure to covid-19: Secondary | ICD-10-CM | POA: Diagnosis present

## 2019-12-03 DIAGNOSIS — E119 Type 2 diabetes mellitus without complications: Secondary | ICD-10-CM | POA: Diagnosis present

## 2019-12-03 DIAGNOSIS — I252 Old myocardial infarction: Secondary | ICD-10-CM

## 2019-12-03 DIAGNOSIS — Z6837 Body mass index (BMI) 37.0-37.9, adult: Secondary | ICD-10-CM

## 2019-12-03 DIAGNOSIS — Z8616 Personal history of COVID-19: Secondary | ICD-10-CM

## 2019-12-03 DIAGNOSIS — Z7901 Long term (current) use of anticoagulants: Secondary | ICD-10-CM

## 2019-12-03 DIAGNOSIS — Z7984 Long term (current) use of oral hypoglycemic drugs: Secondary | ICD-10-CM

## 2019-12-03 DIAGNOSIS — I2510 Atherosclerotic heart disease of native coronary artery without angina pectoris: Secondary | ICD-10-CM | POA: Diagnosis present

## 2019-12-03 DIAGNOSIS — Z96659 Presence of unspecified artificial knee joint: Secondary | ICD-10-CM

## 2019-12-03 DIAGNOSIS — Z96651 Presence of right artificial knee joint: Secondary | ICD-10-CM

## 2019-12-03 DIAGNOSIS — M1711 Unilateral primary osteoarthritis, right knee: Principal | ICD-10-CM

## 2019-12-03 DIAGNOSIS — E669 Obesity, unspecified: Secondary | ICD-10-CM | POA: Diagnosis present

## 2019-12-03 DIAGNOSIS — Z79899 Other long term (current) drug therapy: Secondary | ICD-10-CM

## 2019-12-03 DIAGNOSIS — I10 Essential (primary) hypertension: Secondary | ICD-10-CM | POA: Diagnosis present

## 2019-12-03 DIAGNOSIS — I4891 Unspecified atrial fibrillation: Secondary | ICD-10-CM | POA: Diagnosis present

## 2019-12-03 HISTORY — PX: TOTAL KNEE ARTHROPLASTY: SHX125

## 2019-12-03 LAB — GLUCOSE, CAPILLARY
Glucose-Capillary: 59 mg/dL — ABNORMAL LOW (ref 70–99)
Glucose-Capillary: 129 mg/dL — ABNORMAL HIGH (ref 70–99)
Glucose-Capillary: 126 mg/dL — ABNORMAL HIGH (ref 70–99)

## 2019-12-03 SURGERY — ARTHROPLASTY, KNEE, TOTAL
Anesthesia: Spinal | Site: Knee | Laterality: Right

## 2019-12-03 MED ORDER — PHENYLEPHRINE 40 MCG/ML (10ML) SYRINGE FOR IV PUSH (FOR BLOOD PRESSURE SUPPORT)
PREFILLED_SYRINGE | INTRAVENOUS | Status: DC | PRN
  Administered 2019-12-03 (×2): 80 ug via INTRAVENOUS

## 2019-12-03 MED ORDER — MORPHINE SULFATE (PF) 4 MG/ML IV SOLN
INTRAVENOUS | Status: DC | PRN
Start: 2019-12-03 — End: 2019-12-03
  Administered 2019-12-03: 8 mg

## 2019-12-03 MED ORDER — ONDANSETRON HCL 4 MG/2ML IJ SOLN
INTRAMUSCULAR | Status: DC | PRN
  Administered 2019-12-03: 4 mg via INTRAVENOUS

## 2019-12-03 MED ORDER — MENTHOL 3 MG MT LOZG
1.0000 | LOZENGE | OROMUCOSAL | Status: DC | PRN
  Filled 2019-12-03: qty 9

## 2019-12-03 MED ORDER — TRANEXAMIC ACID 1000 MG/10ML IV SOLN
INTRAVENOUS | Status: DC | PRN
  Administered 2019-12-03: 1000 mg via TOPICAL

## 2019-12-03 MED ORDER — EPHEDRINE SULFATE-NACL 50-0.9 MG/10ML-% IV SOSY
PREFILLED_SYRINGE | INTRAVENOUS | Status: DC | PRN
  Administered 2019-12-03: 10 mg via INTRAVENOUS

## 2019-12-03 MED ORDER — DILTIAZEM HCL ER COATED BEADS 120 MG PO CP24
120.0000 mg | ORAL_CAPSULE | Freq: Every day | ORAL | Status: DC
  Administered 2019-12-04 – 2019-12-06 (×3): 120 mg via ORAL
  Filled 2019-12-03 (×3): qty 1

## 2019-12-03 MED ORDER — PANTOPRAZOLE SODIUM 40 MG PO TBEC
40.0000 mg | DELAYED_RELEASE_TABLET | Freq: Every day | ORAL | Status: DC
  Administered 2019-12-04 – 2019-12-06 (×3): 40 mg via ORAL
  Filled 2019-12-03 (×3): qty 1

## 2019-12-03 MED ORDER — CELECOXIB 100 MG PO CAPS
100.0000 mg | ORAL_CAPSULE | Freq: Two times a day (BID) | ORAL | Status: DC
  Administered 2019-12-03 – 2019-12-06 (×6): 100 mg via ORAL
  Filled 2019-12-03 (×8): qty 1

## 2019-12-03 MED ORDER — HYDRALAZINE HCL 20 MG/ML IJ SOLN
INTRAMUSCULAR | Status: AC
  Filled 2019-12-03: qty 1

## 2019-12-03 MED ORDER — DEXTROSE 50 % IV SOLN
INTRAVENOUS | Status: AC
  Administered 2019-12-03: 50 mL via INTRAVENOUS
  Filled 2019-12-03: qty 50

## 2019-12-03 MED ORDER — POVIDONE-IODINE 10 % EX SWAB: 2.0000 "application " | Freq: Once | CUTANEOUS | Status: DC

## 2019-12-03 MED ORDER — ACETAMINOPHEN 325 MG PO TABS: 325.0000 mg | ORAL_TABLET | Freq: Four times a day (QID) | ORAL | Status: DC | PRN

## 2019-12-03 MED ORDER — BUPIVACAINE HCL 0.25 % IJ SOLN
INTRAMUSCULAR | Status: DC | PRN
  Administered 2019-12-03: 30 mL

## 2019-12-03 MED ORDER — GLYCOPYRROLATE PF 0.2 MG/ML IJ SOSY
PREFILLED_SYRINGE | INTRAMUSCULAR | Status: DC | PRN
  Administered 2019-12-03: .2 mg via INTRAVENOUS

## 2019-12-03 MED ORDER — LIDOCAINE 2% (20 MG/ML) 5 ML SYRINGE
INTRAMUSCULAR | Status: DC | PRN
  Administered 2019-12-03: 100 mg via INTRAVENOUS

## 2019-12-03 MED ORDER — APIXABAN 5 MG PO TABS
5.0000 mg | ORAL_TABLET | Freq: Two times a day (BID) | ORAL | Status: DC
  Administered 2019-12-04 – 2019-12-06 (×5): 5 mg via ORAL
  Filled 2019-12-03 (×5): qty 1

## 2019-12-03 MED ORDER — PHENYLEPHRINE HCL-NACL 10-0.9 MG/250ML-% IV SOLN
INTRAVENOUS | Status: DC | PRN
  Administered 2019-12-03: 30 ug/min via INTRAVENOUS

## 2019-12-03 MED ORDER — ROCURONIUM BROMIDE 10 MG/ML (PF) SYRINGE
PREFILLED_SYRINGE | INTRAVENOUS | Status: DC | PRN
  Administered 2019-12-03: 20 mg via INTRAVENOUS
  Administered 2019-12-03: 80 mg via INTRAVENOUS

## 2019-12-03 MED ORDER — ONDANSETRON HCL 4 MG/2ML IJ SOLN: 4.0000 mg | Freq: Once | INTRAMUSCULAR | Status: DC | PRN

## 2019-12-03 MED ORDER — DEXTROSE 50 % IV SOLN
50.0000 mL | Freq: Once | INTRAVENOUS | Status: AC
  Filled 2019-12-03: qty 50

## 2019-12-03 MED ORDER — MIDAZOLAM HCL 2 MG/2ML IJ SOLN
INTRAMUSCULAR | Status: AC
  Filled 2019-12-03: qty 2

## 2019-12-03 MED ORDER — CHLORHEXIDINE GLUCONATE 0.12 % MT SOLN
OROMUCOSAL | Status: AC
  Administered 2019-12-03: 15 mL
  Filled 2019-12-03: qty 15

## 2019-12-03 MED ORDER — GLIPIZIDE 5 MG PO TABS
2.5000 mg | ORAL_TABLET | Freq: Every day | ORAL | Status: DC
  Administered 2019-12-04: 2.5 mg via ORAL
  Filled 2019-12-03 (×2): qty 1

## 2019-12-03 MED ORDER — METHOCARBAMOL 1000 MG/10ML IJ SOLN
500.0000 mg | Freq: Four times a day (QID) | INTRAVENOUS | Status: DC | PRN
  Filled 2019-12-03: qty 5

## 2019-12-03 MED ORDER — SODIUM CHLORIDE 0.9 % IR SOLN
Status: DC | PRN
  Administered 2019-12-03: 3000 mL

## 2019-12-03 MED ORDER — MIDAZOLAM HCL 2 MG/2ML IJ SOLN
INTRAMUSCULAR | Status: DC | PRN
  Administered 2019-12-03: 1 mg via INTRAVENOUS

## 2019-12-03 MED ORDER — ONDANSETRON HCL 4 MG/2ML IJ SOLN: 4.0000 mg | Freq: Four times a day (QID) | INTRAMUSCULAR | Status: DC | PRN

## 2019-12-03 MED ORDER — 0.9 % SODIUM CHLORIDE (POUR BTL) OPTIME
TOPICAL | Status: DC | PRN
  Administered 2019-12-03: 4000 mL

## 2019-12-03 MED ORDER — CLONIDINE HCL (ANALGESIA) 100 MCG/ML EP SOLN
EPIDURAL | Status: DC | PRN
  Administered 2019-12-03: 1 mL

## 2019-12-03 MED ORDER — APIXABAN 5 MG PO TABS: 5.0000 mg | ORAL_TABLET | Freq: Two times a day (BID) | ORAL | Status: DC

## 2019-12-03 MED ORDER — BUPIVACAINE HCL (PF) 0.5 % IJ SOLN
INTRAMUSCULAR | Status: DC | PRN
  Administered 2019-12-03: 20 mL via PERINEURAL

## 2019-12-03 MED ORDER — OXYCODONE HCL 5 MG PO TABS
5.0000 mg | ORAL_TABLET | ORAL | Status: DC
  Administered 2019-12-03 – 2019-12-04 (×8): 5 mg via ORAL
  Administered 2019-12-05: 10 mg via ORAL
  Administered 2019-12-05 (×5): 5 mg via ORAL
  Administered 2019-12-06: 10 mg via ORAL
  Administered 2019-12-06 (×3): 5 mg via ORAL
  Filled 2019-12-03 (×2): qty 1
  Filled 2019-12-03: qty 2
  Filled 2019-12-03 (×12): qty 1
  Filled 2019-12-03: qty 2
  Filled 2019-12-03 (×3): qty 1

## 2019-12-03 MED ORDER — METOCLOPRAMIDE HCL 5 MG/ML IJ SOLN: 5.0000 mg | Freq: Three times a day (TID) | INTRAMUSCULAR | Status: DC | PRN

## 2019-12-03 MED ORDER — PROPOFOL 10 MG/ML IV BOLUS
INTRAVENOUS | Status: AC
  Filled 2019-12-03: qty 20

## 2019-12-03 MED ORDER — ACETAMINOPHEN 500 MG PO TABS
1000.0000 mg | ORAL_TABLET | Freq: Four times a day (QID) | ORAL | Status: AC
  Administered 2019-12-03 – 2019-12-04 (×4): 1000 mg via ORAL
  Filled 2019-12-03 (×4): qty 2

## 2019-12-03 MED ORDER — VANCOMYCIN HCL IN DEXTROSE 1-5 GM/200ML-% IV SOLN
INTRAVENOUS | Status: AC
  Filled 2019-12-03: qty 200

## 2019-12-03 MED ORDER — BUPIVACAINE LIPOSOME 1.3 % IJ SUSP
20.0000 mL | Freq: Once | INTRAMUSCULAR | Status: DC
  Filled 2019-12-03: qty 20

## 2019-12-03 MED ORDER — HYDROMORPHONE HCL 1 MG/ML IJ SOLN: 0.5000 mg | INTRAMUSCULAR | Status: DC | PRN

## 2019-12-03 MED ORDER — GABAPENTIN 100 MG PO CAPS
100.0000 mg | ORAL_CAPSULE | Freq: Three times a day (TID) | ORAL | Status: DC
  Administered 2019-12-03 – 2019-12-06 (×9): 100 mg via ORAL
  Filled 2019-12-03 (×9): qty 1

## 2019-12-03 MED ORDER — DEXAMETHASONE SODIUM PHOSPHATE 10 MG/ML IJ SOLN
INTRAMUSCULAR | Status: DC | PRN
  Administered 2019-12-03: 10 mg via INTRAVENOUS

## 2019-12-03 MED ORDER — AMLODIPINE BESYLATE 5 MG PO TABS
5.0000 mg | ORAL_TABLET | Freq: Every day | ORAL | Status: DC
  Administered 2019-12-04 – 2019-12-06 (×3): 5 mg via ORAL
  Filled 2019-12-03 (×3): qty 1

## 2019-12-03 MED ORDER — HYDROMORPHONE HCL 1 MG/ML IJ SOLN
INTRAMUSCULAR | Status: AC
  Filled 2019-12-03: qty 0.5

## 2019-12-03 MED ORDER — VANCOMYCIN HCL 1000 MG IV SOLR
INTRAVENOUS | Status: AC
  Filled 2019-12-03: qty 1000

## 2019-12-03 MED ORDER — LIDOCAINE 2% (20 MG/ML) 5 ML SYRINGE
INTRAMUSCULAR | Status: AC
  Filled 2019-12-03: qty 5

## 2019-12-03 MED ORDER — ONDANSETRON HCL 4 MG/2ML IJ SOLN
INTRAMUSCULAR | Status: AC
  Filled 2019-12-03: qty 2

## 2019-12-03 MED ORDER — CEFAZOLIN SODIUM-DEXTROSE 2-4 GM/100ML-% IV SOLN
2.0000 g | INTRAVENOUS | Status: AC
  Administered 2019-12-03: 2 g via INTRAVENOUS
  Filled 2019-12-03: qty 100

## 2019-12-03 MED ORDER — FENTANYL CITRATE (PF) 250 MCG/5ML IJ SOLN
INTRAMUSCULAR | Status: DC | PRN
Start: 2019-12-03 — End: 2019-12-03
  Administered 2019-12-03 (×5): 50 ug via INTRAVENOUS

## 2019-12-03 MED ORDER — HYDROMORPHONE HCL 1 MG/ML IJ SOLN: 0.2500 mg | INTRAMUSCULAR | Status: DC | PRN

## 2019-12-03 MED ORDER — HYDRALAZINE HCL 20 MG/ML IJ SOLN
INTRAMUSCULAR | Status: DC | PRN
  Administered 2019-12-03: 10 mg via INTRAVENOUS

## 2019-12-03 MED ORDER — DOCUSATE SODIUM 100 MG PO CAPS
100.0000 mg | ORAL_CAPSULE | Freq: Two times a day (BID) | ORAL | Status: DC
  Administered 2019-12-03 – 2019-12-06 (×6): 100 mg via ORAL
  Filled 2019-12-03 (×6): qty 1

## 2019-12-03 MED ORDER — PHENOL 1.4 % MT LIQD
1.0000 | OROMUCOSAL | Status: DC | PRN
  Administered 2019-12-04: 1 via OROMUCOSAL
  Filled 2019-12-03: qty 177

## 2019-12-03 MED ORDER — CEFAZOLIN SODIUM-DEXTROSE 2-4 GM/100ML-% IV SOLN
2.0000 g | Freq: Three times a day (TID) | INTRAVENOUS | Status: AC
  Administered 2019-12-03 – 2019-12-04 (×2): 2 g via INTRAVENOUS
  Filled 2019-12-03 (×2): qty 100

## 2019-12-03 MED ORDER — BUPIVACAINE LIPOSOME 1.3 % IJ SUSP
INTRAMUSCULAR | Status: DC | PRN
  Administered 2019-12-03: 20 mL

## 2019-12-03 MED ORDER — TRANEXAMIC ACID-NACL 1000-0.7 MG/100ML-% IV SOLN: 1000.0000 mg | INTRAVENOUS | Status: DC

## 2019-12-03 MED ORDER — TRANEXAMIC ACID-NACL 1000-0.7 MG/100ML-% IV SOLN
1000.0000 mg | INTRAVENOUS | Status: AC
  Administered 2019-12-03: 1000 mg via INTRAVENOUS
  Filled 2019-12-03: qty 100

## 2019-12-03 MED ORDER — ROCURONIUM BROMIDE 10 MG/ML (PF) SYRINGE
PREFILLED_SYRINGE | INTRAVENOUS | Status: AC
  Filled 2019-12-03: qty 10

## 2019-12-03 MED ORDER — ONDANSETRON HCL 4 MG PO TABS: 4.0000 mg | ORAL_TABLET | Freq: Four times a day (QID) | ORAL | Status: DC | PRN

## 2019-12-03 MED ORDER — BUPIVACAINE HCL (PF) 0.25 % IJ SOLN
INTRAMUSCULAR | Status: AC
  Filled 2019-12-03: qty 30

## 2019-12-03 MED ORDER — POVIDONE-IODINE 7.5 % EX SOLN
Freq: Once | CUTANEOUS | Status: DC
  Filled 2019-12-03: qty 118

## 2019-12-03 MED ORDER — METOCLOPRAMIDE HCL 5 MG PO TABS: 5.0000 mg | ORAL_TABLET | Freq: Three times a day (TID) | ORAL | Status: DC | PRN

## 2019-12-03 MED ORDER — VANCOMYCIN HCL 1000 MG IV SOLR
INTRAVENOUS | Status: DC | PRN
  Administered 2019-12-03: 1000 mg via INTRAVENOUS

## 2019-12-03 MED ORDER — DEXAMETHASONE SODIUM PHOSPHATE 10 MG/ML IJ SOLN
INTRAMUSCULAR | Status: AC
  Filled 2019-12-03: qty 1

## 2019-12-03 MED ORDER — METHOCARBAMOL 500 MG PO TABS: 500.0000 mg | ORAL_TABLET | Freq: Four times a day (QID) | ORAL | Status: DC | PRN

## 2019-12-03 MED ORDER — TRANEXAMIC ACID-NACL 1000-0.7 MG/100ML-% IV SOLN
INTRAVENOUS | Status: AC
  Filled 2019-12-03: qty 100

## 2019-12-03 MED ORDER — BUPIVACAINE-EPINEPHRINE (PF) 0.25% -1:200000 IJ SOLN
INTRAMUSCULAR | Status: AC
  Filled 2019-12-03: qty 10

## 2019-12-03 MED ORDER — LACTATED RINGERS IV SOLN: INTRAVENOUS | Status: DC

## 2019-12-03 MED ORDER — CLONIDINE HCL (ANALGESIA) 100 MCG/ML EP SOLN
EPIDURAL | Status: AC
  Filled 2019-12-03: qty 10

## 2019-12-03 MED ORDER — PROPOFOL 10 MG/ML IV BOLUS
INTRAVENOUS | Status: DC | PRN
  Administered 2019-12-03: 20 mg via INTRAVENOUS
  Administered 2019-12-03: 120 mg via INTRAVENOUS

## 2019-12-03 MED ORDER — FENTANYL CITRATE (PF) 250 MCG/5ML IJ SOLN
INTRAMUSCULAR | Status: AC
  Filled 2019-12-03: qty 5

## 2019-12-03 MED ORDER — MORPHINE SULFATE (PF) 4 MG/ML IV SOLN
INTRAVENOUS | Status: AC
  Filled 2019-12-03: qty 2

## 2019-12-03 MED ORDER — SUGAMMADEX SODIUM 200 MG/2ML IV SOLN
INTRAVENOUS | Status: DC | PRN
  Administered 2019-12-03: 200 mg via INTRAVENOUS

## 2019-12-03 MED ORDER — OXYCODONE HCL 5 MG PO TABS: 5.0000 mg | ORAL_TABLET | ORAL | Status: DC | PRN

## 2019-12-03 MED ORDER — ASPIRIN 81 MG PO CHEW
81.0000 mg | CHEWABLE_TABLET | Freq: Two times a day (BID) | ORAL | Status: DC
  Administered 2019-12-03 – 2019-12-06 (×6): 81 mg via ORAL
  Filled 2019-12-03 (×6): qty 1

## 2019-12-03 SURGICAL SUPPLY — 79 items
BAG DECANTER FOR FLEXI CONT (MISCELLANEOUS) ×3 IMPLANT
BANDAGE ESMARK 6X9 LF (GAUZE/BANDAGES/DRESSINGS) ×1 IMPLANT
BLADE SAG 18X100X1.27 (BLADE) ×3 IMPLANT
BNDG COHESIVE 6X5 TAN STRL LF (GAUZE/BANDAGES/DRESSINGS) ×3 IMPLANT
BNDG ELASTIC 6X15 VLCR STRL LF (GAUZE/BANDAGES/DRESSINGS) ×3 IMPLANT
BNDG ESMARK 6X9 LF (GAUZE/BANDAGES/DRESSINGS) ×3
BOWL SMART MIX CTS (DISPOSABLE) ×3 IMPLANT
CEMENT BONE SIMPLEX SPEEDSET (Cement) ×6 IMPLANT
CLOSURE WOUND 1/2 X4 (GAUZE/BANDAGES/DRESSINGS) ×2
CNTNR URN SCR LID CUP LEK RST (MISCELLANEOUS) ×1 IMPLANT
COMP FEM CEMT PERS NARROW 6 RT (Joint) ×3 IMPLANT
COMPONENT FEM CMT PERS NRW 6RT (Joint) ×1 IMPLANT
CONT SPEC 4OZ STRL OR WHT (MISCELLANEOUS) ×2
COVER SURGICAL LIGHT HANDLE (MISCELLANEOUS) ×3 IMPLANT
COVER WAND RF STERILE (DRAPES) ×3 IMPLANT
CUFF TOURN SGL QUICK 34 (TOURNIQUET CUFF) ×2
CUFF TOURN SGL QUICK 42 (TOURNIQUET CUFF) IMPLANT
CUFF TRNQT CYL 34X4.125X (TOURNIQUET CUFF) ×1 IMPLANT
DECANTER SPIKE VIAL GLASS SM (MISCELLANEOUS) ×3 IMPLANT
DRAPE INCISE IOBAN 66X45 STRL (DRAPES) ×3 IMPLANT
DRAPE ORTHO SPLIT 77X108 STRL (DRAPES) ×6
DRAPE SURG ORHT 6 SPLT 77X108 (DRAPES) ×3 IMPLANT
DRAPE U-SHAPE 47X51 STRL (DRAPES) ×3 IMPLANT
DRSG AQUACEL AG ADV 3.5X14 (GAUZE/BANDAGES/DRESSINGS) ×3 IMPLANT
DURAPREP 26ML APPLICATOR (WOUND CARE) ×6 IMPLANT
ELECT CAUTERY BLADE 6.4 (BLADE) ×3 IMPLANT
ELECT REM PT RETURN 9FT ADLT (ELECTROSURGICAL) ×3
ELECTRODE REM PT RTRN 9FT ADLT (ELECTROSURGICAL) ×1 IMPLANT
GAUZE SPONGE 4X4 12PLY STRL (GAUZE/BANDAGES/DRESSINGS) ×3 IMPLANT
GLOVE BIOGEL PI IND STRL 7.0 (GLOVE) ×1 IMPLANT
GLOVE BIOGEL PI IND STRL 8 (GLOVE) ×1 IMPLANT
GLOVE BIOGEL PI INDICATOR 7.0 (GLOVE) ×2
GLOVE BIOGEL PI INDICATOR 8 (GLOVE) ×2
GLOVE ECLIPSE 7.0 STRL STRAW (GLOVE) ×3 IMPLANT
GLOVE ECLIPSE 8.0 STRL XLNG CF (GLOVE) ×3 IMPLANT
GOWN STRL REUS W/ TWL LRG LVL3 (GOWN DISPOSABLE) ×3 IMPLANT
GOWN STRL REUS W/TWL LRG LVL3 (GOWN DISPOSABLE) ×6
HANDPIECE INTERPULSE COAX TIP (DISPOSABLE) ×2
HDLS TROCR DRIL PIN KNEE 75 (PIN) ×4
HOOD PEEL AWAY FLYTE STAYCOOL (MISCELLANEOUS) ×9 IMPLANT
IMMOBILIZER KNEE 20 (SOFTGOODS) ×3
IMMOBILIZER KNEE 20 THIGH 36 (SOFTGOODS) ×1 IMPLANT
IMMOBILIZER KNEE 22 UNIV (SOFTGOODS) IMPLANT
IMMOBILIZER KNEE 24 THIGH 36 (MISCELLANEOUS) IMPLANT
IMMOBILIZER KNEE 24 UNIV (MISCELLANEOUS)
INSERT TIB AS PERS CD3-9 18 RT (Insert) ×3 IMPLANT
KIT BASIN OR (CUSTOM PROCEDURE TRAY) ×3 IMPLANT
KIT TURNOVER KIT B (KITS) ×3 IMPLANT
MANIFOLD NEPTUNE II (INSTRUMENTS) ×3 IMPLANT
NEEDLE 22X1 1/2 (OR ONLY) (NEEDLE) ×6 IMPLANT
NEEDLE SPNL 18GX3.5 QUINCKE PK (NEEDLE) ×3 IMPLANT
NS IRRIG 1000ML POUR BTL (IV SOLUTION) ×3 IMPLANT
PACK TOTAL JOINT (CUSTOM PROCEDURE TRAY) ×3 IMPLANT
PAD ARMBOARD 7.5X6 YLW CONV (MISCELLANEOUS) ×6 IMPLANT
PAD CAST 4YDX4 CTTN HI CHSV (CAST SUPPLIES) ×1 IMPLANT
PADDING CAST COTTON 4X4 STRL (CAST SUPPLIES) ×2
PADDING CAST COTTON 6X4 STRL (CAST SUPPLIES) ×3 IMPLANT
PIN DRILL HDLS TROCAR 75 4PK (PIN) ×2 IMPLANT
SET HNDPC FAN SPRY TIP SCT (DISPOSABLE) ×1 IMPLANT
STEM POLY PAT PLY 32M KNEE (Knees) ×3 IMPLANT
STEM TIB ST PERS 14+30 (Stem) ×3 IMPLANT
STEM TIBIA 5 DEG SZ D R KNEE (Knees) ×1 IMPLANT
STRIP CLOSURE SKIN 1/2X4 (GAUZE/BANDAGES/DRESSINGS) ×4 IMPLANT
SUCTION FRAZIER HANDLE 10FR (MISCELLANEOUS) ×2
SUCTION TUBE FRAZIER 10FR DISP (MISCELLANEOUS) ×1 IMPLANT
SUT MNCRL AB 3-0 PS2 18 (SUTURE) ×3 IMPLANT
SUT VIC AB 0 CT1 27 (SUTURE) ×6
SUT VIC AB 0 CT1 27XBRD ANBCTR (SUTURE) ×3 IMPLANT
SUT VIC AB 1 CT1 27 (SUTURE) ×10
SUT VIC AB 1 CT1 27XBRD ANBCTR (SUTURE) ×5 IMPLANT
SUT VIC AB 2-0 CT1 27 (SUTURE) ×8
SUT VIC AB 2-0 CT1 TAPERPNT 27 (SUTURE) ×4 IMPLANT
SYR 30ML LL (SYRINGE) ×9 IMPLANT
SYR TB 1ML LUER SLIP (SYRINGE) ×3 IMPLANT
TIBIA STEM 5 DEG SZ D R KNEE (Knees) ×3 IMPLANT
TOWEL GREEN STERILE (TOWEL DISPOSABLE) ×6 IMPLANT
TOWEL GREEN STERILE FF (TOWEL DISPOSABLE) ×6 IMPLANT
TRAY CATH 16FR W/PLASTIC CATH (SET/KITS/TRAYS/PACK) IMPLANT
WATER STERILE IRR 1000ML POUR (IV SOLUTION) ×3 IMPLANT

## 2019-12-03 NOTE — Plan of Care (Addendum)
CPM on at shift change. Pt educated on incentive spirometer and understands. Pt transferred form bed to [facility] with walker and knee immobilizer. Pt tolerated transfer very well. Pt resting and stable, will continue to monitor.  Patient insisted on wearing the CPM, patient educated to take breaks as needed. No complaints of pain just mild throat soreness.   Problem: Education: Goal: Knowledge of General Education information will improve Description: Including pain rating scale, medication(s)/side effects and non-pharmacologic comfort measures Outcome: Progressing   Problem: Health Behavior/Discharge Planning: Goal: Ability to manage health-related needs will improve Outcome: Progressing   Problem: Clinical Measurements: Goal: Ability to maintain clinical measurements within normal limits will improve Outcome: Progressing   Problem: Activity: Goal: Risk for activity intolerance will decrease Outcome: Progressing   Problem: Pain Managment: Goal: General experience of comfort will improve Outcome: Progressing   Problem: Safety: Goal: Ability to remain free from injury will improve Outcome: Progressing   Problem: Skin Integrity: Goal: Risk for impaired skin integrity will decrease Outcome: Progressing

## 2019-12-03 NOTE — Brief Op Note (Signed)
   12/03/2019  11:34 AM  PATIENT:  [name] [name]  69 y.o. adult  PRE-OPERATIVE DIAGNOSIS:  right knee osteoarthritis  POST-OPERATIVE DIAGNOSIS:  right knee osteoarthritis  PROCEDURE:  Procedure(s): RIGHT TOTAL KNEE ARTHROPLASTY  SURGEON:  Surgeon(s): Cammy Copa, MD  ASSISTANT: magnant pa  ANESTHESIA:   general  EBL: 50 ml    Total I/O In: 1200 [I.V.:1000; IV Piggyback:200] Out: 50 [Blood:50]  BLOOD ADMINISTERED: none  DRAINS: none   LOCAL MEDICATIONS USED: Marcaine morphine clonidine vancomycin powder  SPECIMEN:  No Specimen  COUNTS:  YES  TOURNIQUET:   Total Tourniquet Time Documented: Thigh (Right) - 110 minutes Total: Thigh (Right) - 110 minutes   DICTATION: .Other Dictation: Dictation Number [id number]  PLAN OF CARE: Admit for overnight observation  PATIENT DISPOSITION:  PACU - hemodynamically stable

## 2019-12-03 NOTE — Anesthesia Procedure Notes (Signed)
Anesthesia Procedure Image    

## 2019-12-03 NOTE — Progress Notes (Signed)
Orthopedic Tech Progress Note Patient Details:  [name] [name] [birth year] [id number]  CPM Right Knee CPM Right Knee: On Right Knee Flexion (Degrees): 40 Right Knee Extension (Degrees): 10  Post Interventions Patient Tolerated: Well Instructions Provided: Care of device Ortho Devices Type of Ortho Device: Bone foam zero knee Ortho Device/Splint Interventions: Ordered   Post Interventions Patient Tolerated: Well Instructions Provided: Care of device   Donald Pore 12/03/2019, 12:49 PM

## 2019-12-03 NOTE — Progress Notes (Signed)
Used [name] for translation in SS this am.

## 2019-12-03 NOTE — Anesthesia Procedure Notes (Signed)
Procedure Name: Intubation Date/Time: 12/03/2019 8:18 AM Performed by: Thelma Comp, CRNA Pre-anesthesia Checklist: Patient identified, Emergency Drugs available, Suction available and Patient being monitored Patient Re-evaluated:Patient Re-evaluated prior to induction Oxygen Delivery Method: Circle System Utilized Preoxygenation: Pre-oxygenation with 100% oxygen Induction Type: IV induction Ventilation: Mask ventilation without difficulty Laryngoscope Size: Mac and 3 Grade View: Grade II Tube type: Oral Tube size: 7.0 mm Number of attempts: 1 Airway Equipment and Method: Stylet and Oral airway Placement Confirmation: ETT inserted through vocal cords under direct vision,  positive ETCO2 and breath sounds checked- equal and bilateral Secured at: 21 cm Tube secured with: Tape Dental Injury: Teeth and Oropharynx as per pre-operative assessment

## 2019-12-03 NOTE — Anesthesia Postprocedure Evaluation (Signed)
Anesthesia Post Note  Patient: Cherilyn Sautter  Procedure(s) Performed: RIGHT TOTAL KNEE ARTHROPLASTY (Right Knee)     Patient location during evaluation: PACU Anesthesia Type: General Level of consciousness: awake and alert Pain management: pain level controlled Vital Signs Assessment: post-procedure vital signs reviewed and stable Respiratory status: spontaneous breathing, nonlabored ventilation, respiratory function stable and patient connected to nasal cannula oxygen Cardiovascular status: blood pressure returned to baseline and stable Postop Assessment: no apparent nausea or vomiting Anesthetic complications: no   No complications documented.  Last Vitals:  Vitals:   12/03/19 0803 12/03/19 1126  BP:    Pulse: (!) 57   Resp:  (P) 18  Temp:  (P) 36.6 C  SpO2: 96%     Last Pain:  Vitals:   12/03/19 1126  TempSrc:   PainSc: (P) Asleep                 Yuridia Couts S

## 2019-12-03 NOTE — Anesthesia Procedure Notes (Signed)
Anesthesia Regional Block: Adductor canal block   Pre-Anesthetic Checklist: ,, timeout performed, Correct Patient, Correct Site, Correct Laterality, Correct Procedure, Correct Position, site marked, Risks and benefits discussed,  Surgical consent,  Pre-op evaluation,  At surgeon's request and post-op pain management  Laterality: Right  Prep: chloraprep       Needles:  Injection technique: Single-shot  Needle Type: Echogenic Needle     Needle Length: 9cm      Additional Needles:   Procedures:,,,, ultrasound used (permanent image in chart),,,,  Narrative:  Start time: 12/03/2019 7:50 AM End time: 12/03/2019 7:55 AM Injection made incrementally with aspirations every 5 mL.  Performed by: Personally  Anesthesiologist: Eilene Ghazi, MD  Additional Notes: Patient tolerated the procedure well without complications

## 2019-12-03 NOTE — H&P (Signed)
TOTAL KNEE ADMISSION H&P  Patient is being admitted for right total knee arthroplasty.  Subjective:  Chief Complaint:right knee pain.  HPI: [name] [name], 69 y.o. adult, has a history of pain and functional disability in the right knee due to arthritis and has failed non-surgical conservative treatments for greater than 12 weeks to includeNSAID's and/or analgesics, corticosteriod injections, flexibility and strengthening excercises and activity modification.  Onset of symptoms was gradual, starting >10 years ago with gradually worsening course since that time. The patient noted no past surgery on the right knee(s).  Patient currently rates pain in the right knee(s) at 10 out of 10 with activity. Patient has night pain, worsening of pain with activity and weight bearing, pain that interferes with activities of daily living, pain with passive range of motion, crepitus and joint swelling.  Patient has evidence of subchondral sclerosis and joint space narrowing by imaging studies. This patient has had Long history of right knee pain which has reached its peak and is no longer a livable condition for this patient.. There is no active infection.  Patient Active Problem List   Diagnosis Date Noted  . 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 Acute kidney injury (HCC) 03/02/2019  . Atrial fibrillation with RVR (HCC) 02/26/2019  . Acute respiratory failure with hypoxia (HCC) 02/22/2019  . COVID-19 02/22/2019  . Essential hypertension, benign 05/03/2018  . Hypertension 10/24/2017  . Knee osteoarthritis 10/24/2017   Past Medical History:  Diagnosis Date  . Acute kidney injury (HCC) 03/02/2019  . Acute respiratory failure with hypoxia (HCC) 02/22/2019  . Atrial fibrillation with RVR (HCC) 02/26/2019  . COVID-19 02/22/2019  . Flu-like symptoms 02/22/2019  . Hypertension   . Knee osteoarthritis 10/24/2017    Past Surgical History:  Procedure Laterality Date  . NO PAST SURGERIES       Current Facility-Administered Medications  Medication Dose Route Frequency Provider Last Rate Last Admin  . ceFAZolin (ANCEF) IVPB 2g/100 mL premix  2 g Intravenous On Call to OR Magnant, Charles L, PA-C      . dextrose 50 % solution           . povidone-iodine (BETADINE) 7.5 % scrub   Topical Once Magnant, Charles L, PA-C      . povidone-iodine 10 % swab 2 application  2 application Topical Once Magnant, Charles L, PA-C      . povidone-iodine 10 % swab 2 application  2 application Topical Once Magnant, Charles L, PA-C      . tranexamic acid (CYKLOKAPRON) IVPB 1,000 mg  1,000 mg Intravenous To OR Magnant, Charles L, PA-C       No Known Allergies  Social History   Tobacco Use  . Smoking status: Never Smoker  . Smokeless tobacco: Never Used  Substance Use Topics  . Alcohol use: Never    Family History  Family history unknown: Yes     Review of Systems  Musculoskeletal: Positive for arthralgias.  All other systems reviewed and are negative.   Objective:  Physical Exam Vitals reviewed.  HENT:     Head: Normocephalic.     Nose: Nose normal.     Mouth/Throat:     Mouth: Mucous membranes are moist.  Eyes:     Pupils: Pupils are equal, round, and reactive to light.  Cardiovascular:     Rate and Rhythm: Normal rate.     Pulses: Normal pulses.  Pulmonary:     Effort: Pulmonary effort is normal.  Abdominal:  General: Abdomen is flat.  Musculoskeletal:     Cervical back: Normal range of motion.  Skin:    General: Skin is warm.     Capillary Refill: Capillary refill takes less than 2 seconds.  Neurological:     General: No focal deficit present.     Mental Status: She is alert.  Psychiatric:        Mood and Affect: Mood normal.   Examination of the right knee demonstrates palpable pedal pulses.  Extensor mechanism is intact.  Varus alignment present.  Mild effusion present.  5 degree flexion contracture with flexion just past 90.  Vital signs in last 24  hours: Temp:  [97.8 F (36.6 C)] 97.8 F (36.6 C) (11/02 0705) Pulse Rate:  [65] 65 (11/02 0705) Resp:  [18] 18 (11/02 0705) BP: (205)/(90) 205/90 (11/02 0705) SpO2:  [98 %] 98 % (11/02 0705) Weight:  [99.1 kg] 99.1 kg (11/02 0705)  Labs:   Estimated body mass index is 37.49 kg/m as calculated from the following:   Height as of this encounter: 5\' 4"  (1.626 m).   Weight as of this encounter: 99.1 kg.   Imaging Review Plain radiographs demonstrate severe degenerative joint disease of the right knee(s). The overall alignment issignificant varus. The bone quality appears to be fair for age and reported activity level.      Assessment/Plan:  End stage arthritis, right knee   The patient history, physical examination, clinical judgment of the provider and imaging studies are consistent with end stage degenerative joint disease of the right knee(s) and total knee arthroplasty is deemed medically necessary. The treatment options including medical management, injection therapy arthroscopy and arthroplasty were discussed at length. The risks and benefits of total knee arthroplasty were presented and reviewed. The risks due to aseptic loosening, infection, stiffness, patella tracking problems, thromboembolic complications and other imponderables were discussed. The patient acknowledged the explanation, agreed to proceed with the plan and consent was signed. Patient is being admitted for inpatient treatment for surgery, pain control, PT, OT, prophylactic antibiotics, VTE prophylaxis, progressive ambulation and ADL's and discharge planning. The patient is planning to be discharged to skilled nursing facility we did discuss at length today with this patient her medications.  She stopped taking Eliquis on Friday.  She understands that this will be a painful operation.  Risk and benefits discussed in clinic.  All questions answered.     Patient's anticipated LOS is less than 2 midnights, meeting  these requirements: - Younger than 73 - Lives within 1 hour of care - Has a competent adult at home to recover with post-op recover - NO history of  - Chronic pain requiring opiods  - Diabetes  - Coronary Artery Disease  - Heart failure  - Heart attack  - Stroke  - DVT/VTE  - Cardiac arrhythmia  - Respiratory Failure/COPD  - Renal failure  - Anemia  - Advanced Liver disease

## 2019-12-03 NOTE — Progress Notes (Signed)
Inpatient Diabetes Program Recommendations  AACE/ADA: New Consensus Statement on Inpatient Glycemic Control (2015)  Target Ranges:  Prepandial:   less than 140 mg/dL      Peak postprandial:   less than 180 mg/dL (1-2 hours)      Critically ill patients:  140 - 180 mg/dL   Lab Results  Component Value Date   GLUCAP 126 (H) 12/03/2019   HGBA1C 5.3 10/11/2019    Review of Glycemic Control  Results for SARABETH, BENTON (MRN [id number]) as of 12/03/2019 14:08  Ref. Range 11/28/2019 13:09 11/28/2019 14:22 12/03/2019 06:53 12/03/2019 08:00 12/03/2019 11:27  Glucose-Capillary Latest Ref Range: 70 - 99 mg/dL 68 (L) 545 (H) 59 (L) 625 (H) 126 (H)   Diabetes history: DM 2 Outpatient Diabetes medications: Glipizide 2.5 mg daily Current orders for Inpatient glycemic control:  Glipizide 2.5 mg daily  Inpatient Diabetes Program Recommendations:    Recommend holding Glipizide while patient is in the hospital.   Consider adding Novolog sensitive correction tid with meals and HS scale.    Thanks  Beryl Meager, RN, BC-ADM Inpatient Diabetes Coordinator Pager [phone] (8a-5p)

## 2019-12-03 NOTE — Evaluation (Signed)
Physical Therapy Evaluation Patient Details Name: [name] [name] MRN: [id number] DOB: [birth year] Today's Date: 12/03/2019   History of Present Illness  pt is a 69 y/o female with PMH significant for DM2, Covid 19, HTN, OA, admitted for R TKA.  Clinical Impression  Pt admitted with/for R TKA.  Pt needing interpreter and max assist today, but expect steady progress once a routine is established.  Pt currently limited functionally due to the problems listed below.  (see problems list.)  Pt will benefit from PT to maximize function and safety to be able to get home safely with available assist.     Follow Up Recommendations Home health PT (if 24 hour assist and pt progresses well o/w CIR)    Equipment Recommendations  Other (comment) (?3 in 1)    Recommendations for Other Services       Precautions / Restrictions Precautions Precautions: Knee Restrictions Weight Bearing Restrictions: Yes RLE Weight Bearing: Weight bearing as tolerated      Mobility  Bed Mobility Overal bed mobility: Needs Assistance Bed Mobility: Supine to Sit;Sit to Supine     Supine to sit: Max assist Sit to supine: Max assist        Transfers Overall transfer level: Needs assistance   Transfers: Sit to/from Stand;Stand Pivot Transfers;Squat Pivot Transfers Sit to Stand: Max assist Stand pivot transfers: Max assist Squat pivot transfers: Max assist     General transfer comment: some difficulty with interpreter getting command quickly necessitating more assist  Ambulation/Gait                Stairs            Wheelchair Mobility    Modified Rankin (Stroke Patients Only)       Balance Overall balance assessment: Mild deficits observed, not formally tested   Sitting balance-Leahy Scale: Poor       Standing balance-Leahy Scale: Poor                               Pertinent Vitals/Pain Pain Assessment: Faces Faces Pain Scale: Hurts even more Pain  Location: R Knee Pain Descriptors / Indicators: Grimacing;Guarding;Discomfort;Sore Pain Intervention(s): Limited activity within patient's tolerance;Monitored during session    Home Living Family/patient expects to be discharged to:: Private residence Living Arrangements: Spouse/significant other;Non-relatives/Friends Available Help at Discharge: Friend(s);Available PRN/intermittently Type of Home: House Home Access: Level entry     Home Layout: Two level Home Equipment: Walker - 2 wheels Additional Comments: Lives with roommate    Prior Function Level of Independence: Independent         Comments: Ambulatory with intermittent use of RW. Does not drive; relies on friends for transportation     Hand Dominance        Extremity/Trunk Assessment   Upper Extremity Assessment Upper Extremity Assessment: Overall WFL for tasks assessed    Lower Extremity Assessment Lower Extremity Assessment: RLE deficits/detail RLE Deficits / Details: Knee PROM to 50* RLE: Unable to fully assess due to pain RLE Coordination: decreased fine motor       Communication   Communication: Prefers language other than [place];Other (comment) (interpreter for Lingala utilized)  Cognition Arousal/Alertness: Awake/alert Behavior During Therapy: WFL for tasks assessed/performed Overall Cognitive Status: Within Functional Limits for tasks assessed  General Comments General comments (skin integrity, edema, etc.): Interpreting was too slow to be effective today.  Pt unable to assimilate instruction due to pain.    Exercises     Assessment/Plan    PT Assessment Patient needs continued PT services  PT Problem List Decreased strength;Decreased range of motion;Decreased activity tolerance;Decreased mobility;Pain;Decreased safety awareness       PT Treatment Interventions DME instruction;Gait training;Stair training;Functional mobility  training;Therapeutic activities;Therapeutic exercise;Patient/family education    PT Goals (Current goals can be found in the Care Plan section)  Acute Rehab PT Goals Patient Stated Goal: unable to state PT Goal Formulation: Patient unable to participate in goal setting Time For Goal Achievement: 12/17/19 Potential to Achieve Goals: Good    Frequency BID   Barriers to discharge        Co-evaluation               AM-PAC PT "6 Clicks" Mobility  Outcome Measure Help needed turning from your back to your side while in a flat bed without using bedrails?: Total Help needed moving from lying on your back to sitting on the side of a flat bed without using bedrails?: Total Help needed moving to and from a bed to a chair (including a wheelchair)?: Total Help needed standing up from a chair using your arms (e.g., wheelchair or bedside chair)?: Total Help needed to walk in hospital room?: Total Help needed climbing 3-5 steps with a railing? : Total 6 Click Score: 6    End of Session Equipment Utilized During Treatment: Oxygen Activity Tolerance: Patient limited by pain;Patient tolerated treatment well Patient left: in bed;with call bell/phone within reach;with nursing/sitter in room Nurse Communication: Mobility status PT Visit Diagnosis: Unsteadiness on feet (R26.81);Other abnormalities of gait and mobility (R26.89);Pain Pain - Right/Left: Right Pain - part of body: Knee    Time: 1700-1752 PT Time Calculation (min) (ACUTE ONLY): 52 min   Charges:   PT Evaluation $PT Eval Moderate Complexity: 1 Mod PT Treatments $Therapeutic Exercise: 8-22 mins $Therapeutic Activity: 8-22 mins        12/03/2019  Jacinto Halim., PT Acute Rehabilitation Services [phone]  (pager) [phone]  (office)  Eliseo Gum Billye Nydam 12/03/2019, 6:05 PM

## 2019-12-03 NOTE — Congregational Nurse Program (Signed)
  Dept: [phone]   Congregational Nurse Program Note  Date of Encounter: 12/02/2019  Past Medical History: Past Medical History:  Diagnosis Date  . Acute kidney injury (HCC) 03/02/2019  . Acute respiratory failure with hypoxia (HCC) 02/22/2019  . Atrial fibrillation with RVR (HCC) 02/26/2019  . COVID-19 02/22/2019  . Flu-like symptoms 02/22/2019  . Hypertension   . Knee osteoarthritis 10/24/2017    Encounter Details: Visit to client to answer questions and lend support prior to hospital admission for right total knee replacement. [place] interpretation via [employer] [phone] Remi Deter. Client understands NPS after 2300. She can take morning medications with sips of water. CN explained that she will contact nursing unit social worker/case manager to discuss discharge plans and any home health support. Client can call CN after she is on the nursing unit. CN prayed with client. Shann Medal RN BSN CNP [phone]

## 2019-12-03 NOTE — Transfer of Care (Signed)
Immediate Anesthesia Transfer of Care Note  Patient: [name]  Procedure(s) Performed: RIGHT TOTAL KNEE ARTHROPLASTY (Right Knee)  Patient Location: PACU  Anesthesia Type:General and GA combined with regional for post-op pain   Level of Consciousness: awake, alert  and patient cooperative  Airway & Oxygen Therapy: Patient Spontanous Breathing and Patient connected to face mask oxygen  Post-op Assessment: Report given to RN and Post -op Vital signs reviewed and stable  Post vital signs: Reviewed and stable  Last Vitals:  Vitals Value Taken Time  BP 119/69 12/03/19 1126  Temp    Pulse 64 12/03/19 1129  Resp 18 12/03/19 1129  SpO2 100 % 12/03/19 1129  Vitals shown include unvalidated device data.  Last Pain:  Vitals:   12/03/19 0750  TempSrc:   PainSc: 10-Worst pain ever         Complications: No complications documented.

## 2019-12-04 ENCOUNTER — Encounter (HOSPITAL_COMMUNITY): Payer: Self-pay | Admitting: Orthopedic Surgery

## 2019-12-04 ENCOUNTER — Encounter: Payer: Self-pay | Admitting: Pediatric Intensive Care

## 2019-12-04 DIAGNOSIS — Z96659 Presence of unspecified artificial knee joint: Secondary | ICD-10-CM

## 2019-12-04 LAB — COMPREHENSIVE METABOLIC PANEL
ALT: 17 U/L (ref 0–44)
AST: 19 U/L (ref 15–41)
Albumin: 2.9 g/dL — ABNORMAL LOW (ref 3.5–5.0)
Alkaline Phosphatase: 55 U/L (ref 38–126)
Anion gap: 11 (ref 5–15)
BUN: 11 mg/dL (ref 8–23)
CO2: 23 mmol/L (ref 22–32)
Calcium: 8.5 mg/dL — ABNORMAL LOW (ref 8.9–10.3)
Chloride: 104 mmol/L (ref 98–111)
Creatinine, Ser: 0.94 mg/dL (ref 0.44–1.00)
GFR, Estimated: >60 mL/min (ref >60)
Glucose, Bld: 165 mg/dL — ABNORMAL HIGH (ref 70–99)
Potassium: 4.2 mmol/L (ref 3.5–5.1)
Sodium: 138 mmol/L (ref 135–145)
Total Bilirubin: 0.4 mg/dL (ref 0.3–1.2)
Total Protein: 6.3 g/dL — ABNORMAL LOW (ref 6.5–8.1)

## 2019-12-04 LAB — GLUCOSE, CAPILLARY
Glucose-Capillary: 91 mg/dL (ref 70–99)
Glucose-Capillary: 104 mg/dL — ABNORMAL HIGH (ref 70–99)
Glucose-Capillary: 124 mg/dL — ABNORMAL HIGH (ref 70–99)

## 2019-12-04 LAB — HEMOGLOBIN A1C
Hgb A1c MFr Bld: 6 % — ABNORMAL HIGH (ref 4.8–5.6)
Mean Plasma Glucose: 125.5 mg/dL

## 2019-12-04 MED ORDER — INSULIN ASPART 100 UNIT/ML ~~LOC~~ SOLN
0.0000 [IU] | Freq: Three times a day (TID) | SUBCUTANEOUS | Status: DC
  Administered 2019-12-05 – 2019-12-06 (×2): 1 [IU] via SUBCUTANEOUS

## 2019-12-04 NOTE — TOC Initial Note (Signed)
Transition of Care (TOC) - Initial/Assessment Note    Patient Details  Name: [name] [name] MRN: [id number] Date of Birth: 01/24/1951  Transition of Care (TOC) CM/SW Contact:    Michelle R Stubbldfield, RN Phone Number: 12/04/2019, 12:32 PM  Clinical Narrative:                 Case management met with the patient at the bedside regarding transitions of care to home.  The patient lives with her husband and 2 mentally challenged family members at the listed address.  The patient lives in a home in [place] and is followed by [place] with congregational nursing program - [phone].  No one drives in the patient's home but transportation is provided to appointments from a caregiver listed on the facesheet - [name] - who speaks French and is able to interpret and assist the patient - [phone].  The patient's native language is Lingala.  The patient does not have insurance and PCP services are provided [facility] - follow up appointment was made and is listed on the discharge instructions.  DME - patient currently has an arm crutch and two types of rolling walkers at home.  The patient was given a 3:1 through [employer] (charity) and was given to the caregiver, [name] - and taken to the home today prior to discharge to home today or tomorrow.  The patient was set up with [facility] for charity home health services with PT visits to the home.  I spoke with Cory, [facility] and he is aware that the patient may discharge home today or tomorrow.  Will continue to follow for discharge needs for home.  If discharged today - please call [name] for discharge home and if discharged tomorrow - will have to be set up with [facility] transportation.  Expected Discharge Plan: Home w Home Health Services Barriers to Discharge: No Barriers Identified   Patient Goals and CMS Choice Patient states their goals for this hospitalization and ongoing recovery are::  Patient plans to discharge home with family - being followed by congregational nursing at the home. CMS Medicare.gov Compare Post Acute Care list provided to:: Patient Choice offered to / list presented to : Patient  Expected Discharge Plan and Services Expected Discharge Plan: Home w Home Health Services   Discharge Planning Services: CM Consult Post Acute Care Choice: Durable Medical Equipment, Home Health Living arrangements for the past 2 months: Single Family Home                 DME Arranged: 3-N-1 DME Agency: [employer] (provided with charity 3:1 for home) Date DME Agency Contacted: 12/04/19 Time DME Agency Contacted: 1018 Representative spoke with at DME Agency: provided from 5 N supply room HH Arranged: PT HH Agency: [facility] Date HH Agency Contacted: 12/04/19 Time HH Agency Contacted: 1018 Representative spoke with at HH Agency: Cory, [facility]  Prior Living Arrangements/Services Living arrangements for the past 2 months: Single Family Home Lives with:: Spouse Patient language and need for interpreter reviewed:: Yes Do you feel safe going back to the place where you live?: Yes      Need for Family Participation in Patient Care: Yes (Comment) Care giver support system in place?: Yes (comment)   Criminal Activity/Legal Involvement Pertinent to Current Situation/Hospitalization: No - Comment as needed  Activities of Daily Living Home Assistive Devices/Equipment: Cane (specify quad or straight), Walker (specify type) ADL Screening (condition at time of admission) Patient's cognitive ability   adequate to safely complete daily activities?: Yes Is the patient deaf or have difficulty hearing?: No Does the patient have difficulty seeing, even when wearing glasses/contacts?: No Does the patient have difficulty concentrating, remembering, or making decisions?: No Patient able to express need for assistance with ADLs?: Yes Does the patient have difficulty dressing or  bathing?: Yes Independently performs ADLs?: Yes (appropriate for developmental age) Does the patient have difficulty walking or climbing stairs?: Yes Weakness of Legs: Right Weakness of Arms/Hands: None  Permission Sought/Granted Permission sought to share information with : Case Manager Permission granted to share information with : Yes, Verbal Permission Granted     Permission granted to share info w AGENCY: [facility] agency  Permission granted to share info w Relationship: [name] - congregational nurse - [phone],  Skipper Cliche - friend     Emotional Assessment Appearance:: Appears stated age Attitude/Demeanor/Rapport: Engaged Affect (typically observed): Accepting Orientation: : Oriented to Self, Oriented to Place, Oriented to  Time, Oriented to Situation Alcohol / Substance Use: Not Applicable Psych Involvement: No (comment)  Admission diagnosis:  S/P knee surgery [Z98.890] Patient Active Problem List   Diagnosis Date Noted  . S/P knee surgery 12/03/2019  . Type 2 diabetes mellitus with obesity ([place]) 07/22/2019  . Acute kidney injury ([place]) 03/02/2019  . Atrial fibrillation with RVR ([place]) 02/26/2019  . Acute respiratory failure with hypoxia ([place]) 02/22/2019  . COVID-19 02/22/2019  . Essential hypertension, benign 05/03/2018  . Hypertension 10/24/2017  . Knee osteoarthritis 10/24/2017   PCP:  Antony Blackbird, MD Pharmacy:   [facility] [address] [place] [address] Phone: [phone] Fax: [phone]     Social Determinants of Health (SDOH) Interventions    Readmission Risk Interventions Readmission Risk Prevention Plan 12/04/2019  Post Dischage Appt Complete  Medication Screening Complete  Transportation Screening Complete

## 2019-12-04 NOTE — Op Note (Signed)
NAME: [name] [name], [place] MEDICAL RECORD ER:[id number] ACCOUNT [id number] DATE OF BIRTH:1950-04-14 FACILITY: [facility] LOCATION: [facility] PHYSICIAN:Ankita Newcomer Diamantina Providence, MD  OPERATIVE REPORT  DATE OF PROCEDURE:  12/03/2019  PREOPERATIVE DIAGNOSIS:  Right knee arthritis.  POSTOPERATIVE DIAGNOSIS:  Right knee arthritis.  PROCEDURE:  Right total knee replacement using Persona cruciate retaining narrow cemented femur size 6 with 5-degree cemented stemmed tibia, 14 mm diameter tapered stem extension with 32 mm all poly patella cemented with medial congruent, right 18 mm  tibial insert.  SURGEON:  Cammy Copa, MD  ASSISTANT:  Karenann Cai, PA  INDICATIONS:  The patient presents with end-stage right knee arthritis.  After explanation of risks and benefits, she wanted to proceed with total knee replacement.  PROCEDURE IN DETAIL:  The patient was brought to the operating room where general anesthetic was induced.  Preoperative antibiotics administered.  Timeout was called.  Right leg had preexisting hyperextension bilaterally of about 10 degrees.  This was  noted most easily after she had general anesthetic.  Had a little bit more guarding in clinic in terms of evaluating her extension.  The patient did have varus alignment as well.  Right leg was prescrubbed with alcohol and Betadine, allowed to air dry.   Prepped with DuraPrep solution and draped in sterile manner.  Ioban used to cover the operative field.  The leg was elevated with the Esmarch wrap.  Tourniquet was inflated initially to 300 mmHg.  Total blood pressure was too high, and we had to increase  that to 350 mmHg.  Anterior approach to knee was made.  The IrriSept was used to irrigate the incision.  Median parapatellar approach was made, and IrriSept again used within the incision.  Fat pad partially excised.  Lateral patellofemoral ligament was  released.  Soft tissue removed from the anterior distal femur.  Medial soft tissue  dissection was performed around the posteromedial aspect of the tibia and down to below the pes tendons in order to facilitate soft tissue balancing.  Anterior horn of  the lateral meniscus was released.  Intramedullary alignment was then used to cut the tibia perpendicular to the mechanical axis.  This was a 1 mm cut off of a very worn medial tibial plateau and a 15 mm cut off of the relatively unworn lateral tibial  plateau.  Collaterals and neurovascular structures were protected.  Next, 8 mm cut made off the femur, which was cut in 5 degrees of valgus.  A spacer block was placed, and it was determined that an 16-18 mm spacer block was required to achieve soft  tissue balance.  Next, the tibia was sized to a size 6.  Anterior, posterior and chamfer cuts were made.  Trial components placed.  The femur ended up being cut in about 5 degrees of external rotation in order to allow for flexion and extension gap  balancing.  Trial components placed after those cuts were made.  The patient required an 18 mm spacer to achieve about 5 degrees of hyperextension.  With that spacer in place, the patient had very good varus and valgus stability, excellent patellar  tracking, and good stability to varus and valgus stress at both 0, 30, and 90 degrees.  Next, the patella was cut down from 24 to 14, and a 3-peg patella was placed.  Tibia was keel punched.  Same stability parameters were maintained.  Trial components  were removed.  It was elected to use a cemented stem on this patient.  Next, thorough irrigation  was performed with pulsatile irrigation 3 liters followed by numbing up of the capsule using Exparel and Marcaine solution.  Then, we used TXA topical for  about 3 minutes.  Then, this was removed and components cemented into position with same stability parameters maintained.  Next, the excess cement was removed.  Tourniquet was released.  Bleeding points encountered controlled using electrocautery.   Thorough  irrigation again performed.  Vancomycin powder was then placed after IrriSept solution.  Median parapatellar arthrotomy was closed over a bolster and then using #1 Vicryl suture followed by interrupted inverted 0 Vicryl suture, 2-0 Vicryl  suture, and 3-0 Monocryl.  Steri-Strips and Aquacel dressing applied.  It should be noted that a solution of Marcaine, morphine, clonidine injected into the knee for postop pain relief after the arthrotomy closure.  Luke's assistance was required for  retraction, opening, and closing.  His assistance was medical necessity.  IN/NUANCE  D:12/03/2019 T:12/03/2019 JOB:[id number]

## 2019-12-04 NOTE — Progress Notes (Signed)
Physical Therapy Treatment Patient Details Name: [name] [name] MRN: [id number] DOB: 09/11/1950 Today's Date: 12/04/2019    History of Present Illness pt is a 69 y/o female with PMH significant for DM2, Covid 19, HTN, OA, admitted for R TKA.    PT Comments    Pt with much improved mobility today, ambulating room distance with close guard for safety and use of RW. Pt requires at most min assist for RLE support today. PT initiated TKR exercises today, which pt tolerated well. PT with some concerns about home supervision, as pt lives with husband who just fell on 11/2 and is "bruised up" and additionally lives with two other individuals with special needs. PT hoping pt will continue to progress with mobility, otherwise may need to consider ST-SNF. Will continue to follow acutely.    Follow Up Recommendations  Home health PT;Supervision/Assistance - 24 hour (if 24 hour assist and pt progresses well o/w CIR)     Equipment Recommendations  Other (comment) (?3 in 1)    Recommendations for Other Services       Precautions / Restrictions Precautions Precautions: Knee;Fall Restrictions Weight Bearing Restrictions: No RLE Weight Bearing: Weight bearing as tolerated    Mobility  Bed Mobility Overal bed mobility: Needs Assistance Bed Mobility: Supine to Sit     Supine to sit: Min assist     General bed mobility comments: min assist for RLE lifting and translation to EOB, scooting to edge.  Transfers Overall transfer level: Needs assistance Equipment used: Rolling walker (2 wheeled) Transfers: Sit to/from [employer] Sit to Stand: Min guard Stand pivot transfers: Min guard       General transfer comment: min guard for safety, increased time to rise and steady self. Verbal cuing for hand placement when rising/sitting.  Ambulation/Gait Ambulation/Gait assistance: Min guard Gait Distance (Feet): 30 Feet Assistive device: Rolling walker (2  wheeled) Gait Pattern/deviations: Decreased stride length;Step-through pattern;Trunk flexed;Antalgic Gait velocity: decr   General Gait Details: Min guard for safety, verbal cuing for upright posture, placement in RW, sequencing with RLE leading to offweight as needed with UE support.   Stairs             Wheelchair Mobility    Modified Rankin (Stroke Patients Only)       Balance Overall balance assessment: Needs assistance   Sitting balance-Leahy Scale: Fair       Standing balance-Leahy Scale: Poor Standing balance comment: requires external support in standing                            Cognition Arousal/Alertness: Awake/alert Behavior During Therapy: WFL for tasks assessed/performed Overall Cognitive Status: Within Functional Limits for tasks assessed                                        Exercises Total Joint Exercises Ankle Circles/Pumps: AROM;Right;10 reps;Supine Quad Sets: AROM;Right;10 reps;Supine (with tactile cuing at posterior knee and ankle) Heel Slides: AAROM;Right;10 reps;Supine Straight Leg Raises: AAROM;Right;5 reps;Supine Goniometric ROM: knee aarom ~0-60* limited by pain    General Comments General comments (skin integrity, edema, etc.): Interpreter utilized: [phone] (family friend, unable to use interpreter ipad services because services unavailable when attempted)      Pertinent Vitals/Pain Pain Assessment: 0-10 Pain Score: 7  Pain Location: R Knee Pain Descriptors / Indicators: Sore;Discomfort;Grimacing Pain Intervention(s): Monitored during  session;Repositioned;Limited activity within patient's tolerance    Home Living                      Prior Function            PT Goals (current goals can now be found in the care plan section) Acute Rehab PT Goals PT Goal Formulation: With patient Time For Goal Achievement: 12/17/19 Potential to Achieve Goals: Good Progress towards PT goals:  Progressing toward goals    Frequency    7X/week      PT Plan Current plan remains appropriate    Co-evaluation              AM-PAC PT "6 Clicks" Mobility   Outcome Measure  Help needed turning from your back to your side while in a flat bed without using bedrails?: A Little Help needed moving from lying on your back to sitting on the side of a flat bed without using bedrails?: A Little Help needed moving to and from a bed to a chair (including a wheelchair)?: A Little Help needed standing up from a chair using your arms (e.g., wheelchair or bedside chair)?: A Little Help needed to walk in hospital room?: A Little Help needed climbing 3-5 steps with a railing? : A Lot 6 Click Score: 17    End of Session Equipment Utilized During Treatment: Gait belt Activity Tolerance: Patient limited by pain;Patient tolerated treatment well Patient left: with call bell/phone within reach;in chair Nurse Communication: Mobility status PT Visit Diagnosis: Unsteadiness on feet (R26.81);Other abnormalities of gait and mobility (R26.89);Pain Pain - Right/Left: Right Pain - part of body: Knee     Time: 0850-0918 PT Time Calculation (min) (ACUTE ONLY): 28 min  Charges:  $Gait Training: 8-22 mins $Therapeutic Exercise: 8-22 mins                    Brailyn Delman E, PT Acute Rehabilitation Services Pager [phone]  Office [phone]    Josefita Weissmann D Slevin Gunby 12/04/2019, 11:35 AM

## 2019-12-04 NOTE — Progress Notes (Cosign Needed)
  Subjective: [name] [name] is a 69 y.o. adult s/p right TKA.  They are POD 1.  Pt's pain is controlled.   Pt has ambulated with some difficulty.   She has not had physical therapy yet.  Objective: Vital signs in last 24 hours: Temp:  [97.4 F (36.3 C)-98.3 F (36.8 C)] 98 F (36.7 C) (11/03 0841) Pulse Rate:  [50-68] 50 (11/03 0841) Resp:  [15-19] 16 (11/03 0841) BP: (112-159)/(60-85) 117/72 (11/03 0841) SpO2:  [92 %-100 %] 100 % (11/03 0841)  Intake/Output from previous day: 11/02 0701 - 11/03 0700 In: 2286.9 [I.V.:1886.9; IV Piggyback:400] Out: 50 [Blood:50] Intake/Output this shift: No intake/output data recorded.  Exam:  No gross blood or drainage overlying the dressing Right foot warm and well-perfused Sensation intact distally in the right foot Able to dorsiflex and plantarflex the right foot   Labs: No results for input(s): HGB in the last 72 hours. No results for input(s): WBC, RBC, HCT, PLT in the last 72 hours. Recent Labs    12/04/19 0202  NA 138  K 4.2  CL 104  CO2 23  BUN 11  CREATININE 0.94  GLUCOSE 165*  CALCIUM 8.5*   No results for input(s): LABPT, INR in the last 72 hours.  Assessment/Plan: Pt is POD 1 s/p right TKA.    -Plan to discharge to home today or tomorrow pending patient's pain and PT eval.  She only has her husband has help at home so want to make sure that she is very comfortable with ambulation unsteady on her feet before discharge.  -WBAT with a walker     [name] [name] 12/04/2019, 9:16 AM

## 2019-12-04 NOTE — Progress Notes (Signed)
Physical Therapy Treatment Patient Details Name: [name] [name] MRN: [id number] DOB: 1950-05-26 Today's Date: 12/04/2019    History of Present Illness pt is a 69 y/o female with PMH significant for DM2, Covid 19, HTN, OA, admitted for R TKA.    PT Comments    Pt progressing well with mobility, completing all TKR exercises and ambulating ~100 ft with min guard assist. Pt's congregational RN present for session, and will be helping her transfer and perform exercises intermittently upon d/c. Exercise handout not available in [name], but handout with illustrations administered to help guide pt, caregiver, and RN on exercises upon d/c. Pt does indeed have ramp at home, so no step navigation practice needed at this time. Will continue to follow acutely.    Follow Up Recommendations  Home health PT;Supervision/Assistance - 24 hour      Equipment Recommendations  Other (comment) (?3 in 1)    Recommendations for Other Services       Precautions / Restrictions Precautions Precautions: Knee;Fall Restrictions Weight Bearing Restrictions: No RLE Weight Bearing: Weight bearing as tolerated    Mobility  Bed Mobility Overal bed mobility: Needs Assistance Bed Mobility: Supine to Sit     Supine to sit: Min assist     General bed mobility comments: up in recliner upon PT arrival to room  Transfers Overall transfer level: Needs assistance Equipment used: Rolling walker (2 wheeled) Transfers: Sit to/from Stand Sit to Stand: Min guard Stand pivot transfers: Min guard       General transfer comment: for safety, increased time to rise with VCs for hand placement when rising and sitting. STS x3, from recliner x2 and BSC x1.  Ambulation/Gait Ambulation/Gait assistance: Min guard Gait Distance (Feet): 100 Feet Assistive device: Rolling walker (2 wheeled) Gait Pattern/deviations: Decreased stride length;Step-through pattern;Trunk flexed;Antalgic Gait velocity: decr   General  Gait Details: Min guard for safety, verbal cuing for upright posture x2   Stairs             Wheelchair Mobility    Modified Rankin (Stroke Patients Only)       Balance Overall balance assessment: Needs assistance Sitting-balance support: No upper extremity supported Sitting balance-Leahy Scale: Fair       Standing balance-Leahy Scale: Poor Standing balance comment: requires external support in standing                            Cognition Arousal/Alertness: Awake/alert Behavior During Therapy: WFL for tasks assessed/performed Overall Cognitive Status: Within Functional Limits for tasks assessed                                        Exercises Total Joint Exercises Ankle Circles/Pumps: AROM;Right;10 reps;Supine Quad Sets: AROM;Right;10 reps;Supine (with tactile cuing at posterior knee and ankle) Heel Slides: AAROM;Right;10 reps;Supine Hip ABduction/ADduction: AAROM;Right;10 reps;Supine Straight Leg Raises: AAROM;Right;5 reps;Supine Goniometric ROM: knee aarom ~0-60* limited by pain and stiffness    General Comments General comments (skin integrity, edema, etc.): [employer] french interpreter Alonna Minium [phone]      Pertinent Vitals/Pain Pain Assessment: Faces Pain Score: 7  Faces Pain Scale: Hurts a little bit Pain Location: R Knee Pain Descriptors / Indicators: Sore;Discomfort;Grimacing Pain Intervention(s): Limited activity within patient's tolerance;Monitored during session;Repositioned    Home Living  Prior Function            PT Goals (current goals can now be found in the care plan section) Acute Rehab PT Goals PT Goal Formulation: With patient Time For Goal Achievement: 12/17/19 Potential to Achieve Goals: Good Progress towards PT goals: Progressing toward goals    Frequency    7X/week      PT Plan Current plan remains appropriate    Co-evaluation              AM-PAC PT "6  Clicks" Mobility   Outcome Measure  Help needed turning from your back to your side while in a flat bed without using bedrails?: A Little Help needed moving from lying on your back to sitting on the side of a flat bed without using bedrails?: A Little Help needed moving to and from a bed to a chair (including a wheelchair)?: A Little Help needed standing up from a chair using your arms (e.g., wheelchair or bedside chair)?: A Little Help needed to walk in hospital room?: A Little Help needed climbing 3-5 steps with a railing? : A Lot 6 Click Score: 17    End of Session Equipment Utilized During Treatment: Gait belt Activity Tolerance: Patient limited by pain;Patient tolerated treatment well Patient left: with call bell/phone within reach;in chair;with nursing/sitter in room;with family/visitor present Nurse Communication: Mobility status PT Visit Diagnosis: Unsteadiness on feet (R26.81);Other abnormalities of gait and mobility (R26.89);Pain Pain - Right/Left: Right Pain - part of body: Knee     Time: 3154-0086 PT Time Calculation (min) (ACUTE ONLY): 32 min  Charges:  $Gait Training: 8-22 mins $Therapeutic Exercise: 8-22 mins                     Takuya Lariccia E, PT Acute Rehabilitation Services Pager [phone]  Office [phone]    Terria Deschepper D Despina Hidden 12/04/2019, 3:25 PM

## 2019-12-04 NOTE — Discharge Instructions (Signed)

## 2019-12-05 LAB — GLUCOSE, CAPILLARY
Glucose-Capillary: 111 mg/dL — ABNORMAL HIGH (ref 70–99)
Glucose-Capillary: 137 mg/dL — ABNORMAL HIGH (ref 70–99)
Glucose-Capillary: 116 mg/dL — ABNORMAL HIGH (ref 70–99)
Glucose-Capillary: 115 mg/dL — ABNORMAL HIGH (ref 70–99)

## 2019-12-05 LAB — CBC
HCT: 32.1 % — ABNORMAL LOW (ref 36.0–46.0)
Hemoglobin: 10.2 g/dL — ABNORMAL LOW (ref 12.0–15.0)
MCH: 24.1 pg — ABNORMAL LOW (ref 26.0–34.0)
MCHC: 31.8 g/dL (ref 30.0–36.0)
MCV: 75.9 fL — ABNORMAL LOW (ref 80.0–100.0)
Platelets: 96 10*3/uL — ABNORMAL LOW (ref 150–400)
RBC: 4.23 MIL/uL (ref 3.87–5.11)
RDW: 17.5 % — ABNORMAL HIGH (ref 11.5–15.5)
WBC: 9.5 10*3/uL (ref 4.0–10.5)
nRBC: 0 % (ref 0.0–0.2)

## 2019-12-05 MED ORDER — POLYETHYLENE GLYCOL 3350 17 G PO PACK
17.0000 g | PACK | Freq: Two times a day (BID) | ORAL | Status: DC | PRN
  Administered 2019-12-05 – 2019-12-06 (×2): 17 g via ORAL
  Filled 2019-12-05: qty 1

## 2019-12-05 NOTE — Progress Notes (Signed)
Physical Therapy Treatment Patient Details Name: [name] [name] MRN: [id number] DOB: 25-Oct-1950 Today's Date: 12/05/2019    History of Present Illness pt is a 69 y/o female with PMH significant for DM2, Covid 19, HTN, OA, admitted for R TKA.    PT Comments    Pt complaining of abdominal pain upon arrival to room secondary to not having a bowel movement post-operatively yet (RN notified). Pt, however, agreeable to session. Pt ambulating well with RW and requires less cuing overall for form and safety. Pt with no complaints of dizziness with mobility. Pt also tolerating LE exercises well. Progressing as expected post-operatively, will continue to follow and see for 1 AM session prior to d/c.    Follow Up Recommendations  Home health PT;Supervision/Assistance - 24 hour (if 24 hour assist and pt progresses well o/w CIR)     Equipment Recommendations  Other (comment) (?3 in 1)    Recommendations for Other Services       Precautions / Restrictions Precautions Precautions: Knee;Fall Restrictions Weight Bearing Restrictions: No RLE Weight Bearing: Weight bearing as tolerated    Mobility  Bed Mobility   Bed Mobility: Supine to Sit           General bed mobility comments: up in chair upon PT arrival to room  Transfers Overall transfer level: Needs assistance Equipment used: Rolling walker (2 wheeled) Transfers: Sit to/from Stand Sit to Stand: Min guard         General transfer comment: for safety; sit to stand x4 to practice sequencing, especially hand placement when moving stand>sit.  Ambulation/Gait Ambulation/Gait assistance: Min guard Gait Distance (Feet): 100 Feet Assistive device: Rolling walker (2 wheeled) Gait Pattern/deviations: Decreased stride length;Step-through pattern;Trunk flexed;Antalgic Gait velocity: decr   General Gait Details: supervision for safety, standing rest break x1 at halfway point. Pt self-cued for upright posture.   Stairs              Wheelchair Mobility    Modified Rankin (Stroke Patients Only)       Balance Overall balance assessment: Needs assistance Sitting-balance support: No upper extremity supported Sitting balance-Leahy Scale: Fair       Standing balance-Leahy Scale: Poor Standing balance comment: requires external support in standing                            Cognition Arousal/Alertness: Awake/alert Behavior During Therapy: WFL for tasks assessed/performed Overall Cognitive Status: Within Functional Limits for tasks assessed                                        Exercises Total Joint Exercises Ankle Circles/Pumps: AROM;10 reps;Supine;Both Quad Sets: AROM;Right;10 reps;Supine (with tactile cuing at posterior knee and ankle) Heel Slides: AAROM;Right;10 reps;Supine Knee Flexion: AAROM;Right;5 reps;Seated (with min overpressure, within pt comfort level) Goniometric ROM: knee aarom 5-75*, limited by pain and stiffness    General Comments General comments (skin integrity, edema, etc.): stratus video interpreter - Aline [phone]      Pertinent Vitals/Pain Pain Assessment: Faces Faces Pain Scale: Hurts little more Pain Location: R Knee Pain Descriptors / Indicators: Sore;Discomfort;Grimacing Pain Intervention(s): Limited activity within patient's tolerance;Monitored during session;Repositioned    Home Living                      Prior Function  PT Goals (current goals can now be found in the care plan section) Acute Rehab PT Goals PT Goal Formulation: With patient Time For Goal Achievement: 12/17/19 Potential to Achieve Goals: Good Progress towards PT goals: Progressing toward goals    Frequency    7X/week      PT Plan Current plan remains appropriate    Co-evaluation              AM-PAC PT "6 Clicks" Mobility   Outcome Measure  Help needed turning from your back to your side while in a flat bed without  using bedrails?: A Little Help needed moving from lying on your back to sitting on the side of a flat bed without using bedrails?: A Little Help needed moving to and from a bed to a chair (including a wheelchair)?: A Little Help needed standing up from a chair using your arms (e.g., wheelchair or bedside chair)?: A Little Help needed to walk in hospital room?: A Little Help needed climbing 3-5 steps with a railing? : A Lot 6 Click Score: 17    End of Session Equipment Utilized During Treatment: Gait belt Activity Tolerance: Patient limited by pain;Patient tolerated treatment well Patient left: with call bell/phone within reach;in chair Nurse Communication: Mobility status;Other (comment) (wants a stool softener) PT Visit Diagnosis: Unsteadiness on feet (R26.81);Other abnormalities of gait and mobility (R26.89);Pain Pain - Right/Left: Right Pain - part of body: Knee     Time: 1205-1240 PT Time Calculation (min) (ACUTE ONLY): 35 min  Charges:  $Gait Training: 8-22 mins $Therapeutic Exercise: 8-22 mins                    Tyrea Froberg E, PT Acute Rehabilitation Services Pager [phone]  Office [phone]    Tyrone Apple D Despina Hidden 12/05/2019, 3:24 PM

## 2019-12-05 NOTE — Plan of Care (Signed)
Patient is s/p right TKA with Aquacel dsg clean/dry/intact. Pain controlled with scheduled oxycodone as ordered. Able to transfer to [facility] with one assist and a walker. Anticipating discharge to home 11/4 that needs to be arranged with [facility] transportation. No apparent distress or needs voiced. Will continue to monitor and continue current POC.

## 2019-12-05 NOTE — Progress Notes (Signed)
°  Subjective: [name] [name] is a 69 y.o. adult s/p right TKA.  They are POD 2.  Pt's pain is controlled.  She does complain of lightheadedness and dizziness at times when she sits up in bed or when she ambulates during her therapy sessions.  Pt has ambulated with some difficulty.  Overall she did well in physical therapy yesterday.   Objective: Vital signs in last 24 hours: Temp:  [97.5 F (36.4 C)-98.5 F (36.9 C)] 98.3 F (36.8 C) (11/04 0802) Pulse Rate:  [65-78] 78 (11/04 0802) Resp:  [14-17] 17 (11/04 0802) BP: (105-117)/(58-72) 106/63 (11/04 0802) SpO2:  [96 %-99 %] 97 % (11/04 0802)  Intake/Output from previous day: 11/03 0701 - 11/04 0700 In: 710.2 [P.O.:600; I.V.:110.2] Out: -  Intake/Output this shift: No intake/output data recorded.  Exam:  No gross blood or drainage overlying the dressing Right foot warm and well-perfused Sensation intact distally in the right foot Able to dorsiflex and plantarflex the right foot   Labs: No results for input(s): HGB in the last 72 hours. No results for input(s): WBC, RBC, HCT, PLT in the last 72 hours. Recent Labs    12/04/19 0202  NA 138  K 4.2  CL 104  CO2 23  BUN 11  CREATININE 0.94  GLUCOSE 165*  CALCIUM 8.5*   No results for input(s): LABPT, INR in the last 72 hours.  Assessment/Plan: Pt is POD 2 s/p right TKA.    -Plan to discharge to home in coming days pending patient's pain and PT eval.  -She does complain of lightheadedness and dizziness with ambulating.  Plan to check CBC today to evaluate hemoglobin.  -[place] interpreter was utilized throughout the patient encounter.  -WBAT with a walker     [name] [name] L [facility] 12/05/2019, 10:00 AM

## 2019-12-05 NOTE — Progress Notes (Signed)
Physical Therapy Treatment Patient Details Name: [name] [name] MRN: [id number] DOB: Jun 08, 1950 Today's Date: 12/05/2019    History of Present Illness pt is a 69 y/o female with PMH significant for DM2, Covid 19, HTN, OA, admitted for R TKA.    PT Comments    Pt progressing well with mobility, ambulating increased hallway distance this morning with close guard for safety. Pt requires intermittent standing rest breaks during ambulation to recover RLE/UE fatigue. PT to see pt for pm session prior to d/c home to review all exercises.     Follow Up Recommendations  Home health PT;Supervision/Assistance - 24 hour (if 24 hour assist and pt progresses well o/w CIR)     Equipment Recommendations  Other (comment) (?3 in 1)    Recommendations for Other Services       Precautions / Restrictions Precautions Precautions: Knee;Fall Restrictions Weight Bearing Restrictions: No RLE Weight Bearing: Weight bearing as tolerated    Mobility  Bed Mobility Overal bed mobility: Needs Assistance Bed Mobility: Supine to Sit     Supine to sit: Min assist     General bed mobility comments: Min assist for progression of RLE to EOB, increased time and effort.  Transfers Overall transfer level: Needs assistance Equipment used: Rolling walker (2 wheeled) Transfers: Sit to/from Stand Sit to Stand: Min guard         General transfer comment: for safety, verbal cuing for hand placement when rising and sitting.  Ambulation/Gait Ambulation/Gait assistance: Min guard Gait Distance (Feet): 125 Feet Assistive device: Rolling walker (2 wheeled) Gait Pattern/deviations: Decreased stride length;Step-through pattern;Trunk flexed;Antalgic Gait velocity: decr   General Gait Details: min gaurd for safety, verbal cuing for upright posture, pushing through RW as needed to offweight RLE. R knee buckling x1, pt-corrected. Standing rest breaks x3 during ambulation to recover LE and UE  fatigue.   Stairs             Wheelchair Mobility    Modified Rankin (Stroke Patients Only)       Balance Overall balance assessment: Needs assistance Sitting-balance support: No upper extremity supported Sitting balance-Leahy Scale: Fair       Standing balance-Leahy Scale: Poor Standing balance comment: requires external support in standing                            Cognition Arousal/Alertness: Awake/alert Behavior During Therapy: WFL for tasks assessed/performed Overall Cognitive Status: Within Functional Limits for tasks assessed                                        Exercises Total Joint Exercises Long Arc Quad: AAROM;Right;10 reps;Seated Goniometric ROM: knee aarom 5-75*, limited by pain and stiffness    General Comments General comments (skin integrity, edema, etc.): Stratus phone interpreter Pacu [phone]      Pertinent Vitals/Pain Pain Assessment: 0-10 Pain Score: 8  Pain Location: R Knee Pain Descriptors / Indicators: Sore;Discomfort;Grimacing Pain Intervention(s): Limited activity within patient's tolerance;Monitored during session;Repositioned    Home Living                      Prior Function            PT Goals (current goals can now be found in the care plan section) Acute Rehab PT Goals PT Goal Formulation: With patient Time For Goal Achievement: 12/17/19  Potential to Achieve Goals: Good Progress towards PT goals: Progressing toward goals    Frequency    7X/week      PT Plan Current plan remains appropriate    Co-evaluation              AM-PAC PT "6 Clicks" Mobility   Outcome Measure  Help needed turning from your back to your side while in a flat bed without using bedrails?: A Little Help needed moving from lying on your back to sitting on the side of a flat bed without using bedrails?: A Little Help needed moving to and from a bed to a chair (including a wheelchair)?: A  Little Help needed standing up from a chair using your arms (e.g., wheelchair or bedside chair)?: A Little Help needed to walk in hospital room?: A Little Help needed climbing 3-5 steps with a railing? : A Lot 6 Click Score: 17    End of Session Equipment Utilized During Treatment: Gait belt Activity Tolerance: Patient limited by pain;Patient tolerated treatment well Patient left: with call bell/phone within reach;in chair Nurse Communication: Mobility status;Other (comment) (requesting to order breakfast) PT Visit Diagnosis: Unsteadiness on feet (R26.81);Other abnormalities of gait and mobility (R26.89);Pain Pain - Right/Left: Right Pain - part of body: Knee     Time: 9470-9628 PT Time Calculation (min) (ACUTE ONLY): 30 min  Charges:  $Gait Training: 8-22 mins $Therapeutic Activity: 8-22 mins                     Anthony Roland E, PT Acute Rehabilitation Services Pager [phone]  Office [phone]     Hayden Kihara D Despina Hidden 12/05/2019, 10:32 AM

## 2019-12-06 ENCOUNTER — Other Ambulatory Visit (HOSPITAL_COMMUNITY): Payer: Self-pay | Admitting: Surgical

## 2019-12-06 LAB — GLUCOSE, CAPILLARY
Glucose-Capillary: 107 mg/dL — ABNORMAL HIGH (ref 70–99)
Glucose-Capillary: 148 mg/dL — ABNORMAL HIGH (ref 70–99)

## 2019-12-06 MED ORDER — METHOCARBAMOL 500 MG PO TABS
500.0000 mg | ORAL_TABLET | Freq: Three times a day (TID) | ORAL | 0 refills | Status: DC | PRN
Start: 2019-12-06 — End: 2019-12-23

## 2019-12-06 MED ORDER — MAGNESIUM CITRATE PO SOLN
1.0000 | Freq: Once | ORAL | Status: AC
  Administered 2019-12-06: 1 via ORAL
  Filled 2019-12-06: qty 296

## 2019-12-06 MED ORDER — OXYCODONE-ACETAMINOPHEN 5-325 MG PO TABS
1.0000 | ORAL_TABLET | ORAL | 0 refills | Status: DC | PRN
Start: 2019-12-06 — End: 2019-12-18

## 2019-12-06 NOTE — Congregational Nurse Program (Signed)
  Dept: [phone]   Congregational Nurse Program Note  Date of Encounter: 12/04/2019  Past Medical History: Past Medical History:  Diagnosis Date  . Acute kidney injury (HCC) 03/02/2019  . Acute respiratory failure with hypoxia (HCC) 02/22/2019  . Atrial fibrillation with RVR (HCC) 02/26/2019  . COVID-19 02/22/2019  . Flu-like symptoms 02/22/2019  . Hypertension   . Knee osteoarthritis 10/24/2017    Encounter Details: Visit to client in hospital. Spoke with Marcelino Duster, nurse case manager, regarding discharge plan. She is aware that client has limited support since this CN and client's primary CHW will be out of the area after discharge. Client will receive some visits form PT with [facility]. CN will contact Denyse Amass with [facility] to coordinate visits. CN advised client that she has spoken with client's partner Jed and given him updates on discharge status. Plan to meet client at her home on discharge to make sure she can safely get into her bedroom. Shann Medal RN BSN CNP [phone]

## 2019-12-06 NOTE — Plan of Care (Signed)
No acute changes since the previous night that I took care of her. Plan for discharge home today. Will continue to monitor and continue current POC.

## 2019-12-06 NOTE — Progress Notes (Signed)
Patient stable Pain controlled Ankle dorsiflexion intact on the right Dressing dry Plan discharge today

## 2019-12-06 NOTE — Progress Notes (Signed)
Pt given discharge instructions and gone over with her using IPAD Lingala interpreter. She verbalized understanding of instructions, all questions answered. Pt belongings packed up to be sent with her. Medications delivered to room from [facility]. Pt awaiting transport home.

## 2019-12-06 NOTE — TOC Transition Note (Signed)
Transition of Care [facility]) - CM/SW Discharge Note   Patient Details  Name: [name] [name] MRN: [id number] Date of Birth: 03-26-1950  [facility] CM/SW Contact:  Janae Bridgeman, RN Phone Number: 12/06/2019, 2:57 PM   Clinical Narrative:    Case management spoke with [place], [facility] with congregation nursing and she is going to meet the patient at the house for discharge teaching with the patient and husband.  The patient needed transportation home since the family does not have means of transportation.  [facility] transportation services was called to transport the patient home by Benedetto Goad and the family is aware.    [facility] transportation was called at 2:30 pm to take her home.  The patient does not have financial means to pay for her medication - I provided the [facility] pharmacy with petty cash to pay for her medications.  No other TOC needs at this time.  Patient is being discharged home.     Final next level of care: Home w Home Health Services Barriers to Discharge: No Barriers Identified   Patient Goals and CMS Choice Patient states their goals for this hospitalization and ongoing recovery are:: Patient plans to discharge home with family - being followed by congregational nursing at the home. CMS Medicare.gov Compare Post Acute Care list provided to:: Patient Choice offered to / list presented to : Patient  Discharge Placement                       Discharge Plan and Services   Discharge Planning Services: CM Consult Post Acute Care Choice: Durable Medical Equipment, Home Health          DME Arranged: 3-N-1 DME Agency: [employer] (provided with charity 3:1 for home) Date DME Agency Contacted: 12/04/19 Time DME Agency Contacted: 1018 Representative spoke with at DME Agency: provided from 5 N supply room HH Arranged: PT HH Agency: [facility] Date [facility] Agency Contacted: 12/04/19 Time HH Agency Contacted: 1018 Representative spoke with at  [facility] Agency: Delila Spence  Social Determinants of Health (SDOH) Interventions     Readmission Risk Interventions Readmission Risk Prevention Plan 12/04/2019  Post Dischage Appt Complete  Medication Screening Complete  Transportation Screening Complete

## 2019-12-06 NOTE — Plan of Care (Signed)
  Problem: Health Behavior/Discharge Planning: Goal: Ability to manage health-related needs will improve Outcome: Adequate for Discharge   Problem: Activity: Goal: Risk for activity intolerance will decrease Outcome: Adequate for Discharge   

## 2019-12-06 NOTE — Progress Notes (Addendum)
Physical Therapy Treatment Patient Details Name: [name] [name] MRN: [id number] DOB: 01/14/1951 Today's Date: 12/06/2019    History of Present Illness pt is a 69 y/o female with PMH significant for DM2, Covid 19, HTN, OA, admitted for R TKA.    PT Comments    Pt making good progress toward PT goals, able to progress gait distance up to 261ft using RW and Supervision/min guard at most, chair follow for safety. Pt performed stair trial x6 steps (including 1 seated break) with modA progressing to minA using unilateral handrail and HHA on opposite side, with cues for sequencing. Pt performed sit<>stand from bed, chair and toilet heights with minA initially progressing to min guard at most from chair/toilet and cues for safety needed as pt tending to pull up on RW unless cued not to. Pt min guard for bed mobility. Reviewed knee precautions and HEP handout, pt verbal/visual demo of understanding. Pt requesting bath and to stay for an extra day, RN notified, although pt likely safe to D/C home if physical S/A available for mobility. Pt continues to benefit from PT services to progress toward functional mobility goals. D/C recs below remain appropriate.  Follow Up Recommendations  Home health PT;Supervision/Assistance - 24 hour (if 24/7 S/A available; if assist not available, would benefit from CIR to reach modI status)     Equipment Recommendations  Other (comment) (3 in 1 ?)    Recommendations for Other Services       Precautions / Restrictions Precautions Precautions: Knee;Fall Precaution Booklet Issued: Yes (comment) Precaution Comments: verbal review of knee precs from TKA handout, translated by interpreter. Pt verbalized understanding Restrictions Weight Bearing Restrictions: Yes RLE Weight Bearing: Weight bearing as tolerated    Mobility  Bed Mobility Overal bed mobility: Needs Assistance Bed Mobility: Supine to Sit     Supine to sit: Min guard Sit to supine: Min  assist;HOB elevated   General bed mobility comments: MGA to guide RLE, but pt able to initiate mobility well and scoot her hips on her own  Transfers Overall transfer level: Needs assistance Equipment used: Rolling walker (2 wheeled) Transfers: Sit to/from Stand Sit to Stand: Min guard         General transfer comment: needs cues for hand placement/safety throughout session and not to pull up on RW  Ambulation/Gait Ambulation/Gait assistance: Min guard;Supervision Gait Distance (Feet): 200 Feet (237ft, seated break, 269ft to toilet, 4ft from toilet to be) Assistive device: Rolling walker (2 wheeled) Gait Pattern/deviations: Decreased stride length;Step-through pattern;Trunk flexed;Antalgic Gait velocity: decr   General Gait Details: cues for upright posture and not to rest forearms on RW during standing break, pt min guard at most for safety (at times Supervision), no LOB, chair follow for safety   Stairs Stairs: Yes Stairs assistance: Mod assist;Min assist Stair Management: One rail Right (HHA on LUE) Number of Stairs: 6 General stair comments: pt ascended/descended 2 steps with HR on R and HHA on LUE, cues for sequencing to reduce pain and step-to gait pattern. after seated break, pt able to perform 4 more steps and needs cues again once for proper technique and not to step down with good leg leading   Wheelchair Mobility    Modified Rankin (Stroke Patients Only)       Balance Overall balance assessment: Needs assistance Sitting-balance support: No upper extremity supported Sitting balance-Leahy Scale: Fair Sitting balance - Comments: no LOB seated EOB   Standing balance support: Bilateral upper extremity supported;No upper extremity supported Standing balance-Leahy Scale:  Fair Standing balance comment: requires external support for dynamic standing tasks, but able to briefly stand while donning mask in static stance unsupported no LOB                             Cognition Arousal/Alertness: Awake/alert Behavior During Therapy: WFL for tasks assessed/performed Overall Cognitive Status: Within Functional Limits for tasks assessed                                 General Comments: at times pt with decreased safety awareness but following commands appropriately and asking appropriate questions      Exercises Other Exercises Other Exercises: verbal/visual review of therex to perform with translator interpretation, pt reports understanding able to demo with teachback    General Comments General comments (skin integrity, edema, etc.): Stratus video interpreter [phone] [name] [name]; pt requesting items to take a bath at end of session, RN notified. Pt given toothbrush/toothpaste after session per request      Pertinent Vitals/Pain Pain Assessment: 0-10 Pain Score: 5  Pain Location: R Knee Pain Descriptors / Indicators: Sore;Discomfort;Grimacing Pain Intervention(s): Ice applied;Monitored during session;Premedicated before session;Repositioned   Vitals:   12/06/19 0720  BP: 124/63  Pulse: 75  Resp: 17  Temp: 98.1 F (36.7 C)  SpO2: 98%   Pt reports dizziness toward end of session however BP taken and stable at 119/60 and HR 85 bpm seated/resting  Home Living                      Prior Function            PT Goals (current goals can now be found in the care plan section) Acute Rehab PT Goals Patient Stated Goal: to go home when she is ready PT Goal Formulation: With patient Time For Goal Achievement: 12/17/19 Potential to Achieve Goals: Good Progress towards PT goals: Progressing toward goals    Frequency    7X/week      PT Plan Current plan remains appropriate    Co-evaluation              AM-PAC PT "6 Clicks" Mobility   Outcome Measure  Help needed turning from your back to your side while in a flat bed without using bedrails?: None Help needed moving from lying on your back to  sitting on the side of a flat bed without using bedrails?: A Little Help needed moving to and from a bed to a chair (including a wheelchair)?: A Little Help needed standing up from a chair using your arms (e.g., wheelchair or bedside chair)?: A Little Help needed to walk in hospital room?: A Little Help needed climbing 3-5 steps with a railing? : A Little 6 Click Score: 19    End of Session Equipment Utilized During Treatment: Gait belt Activity Tolerance: Patient tolerated treatment well Patient left: in bed;with bed alarm set;with call bell/phone within reach (ice to R knee) Nurse Communication: Mobility status;Other (comment) (pt requesting bath) PT Visit Diagnosis: Unsteadiness on feet (R26.81);Other abnormalities of gait and mobility (R26.89);Pain Pain - Right/Left: Right Pain - part of body: Knee     Time: 1120-1222 PT Time Calculation (min) (ACUTE ONLY): 62 min  Charges:  $Gait Training: 23-37 mins $Therapeutic Activity: 8-22 mins  Florina Ou., PTA Acute Rehabilitation Services Pager: [phone] Office: [phone]   Angus Palms 12/06/2019, 1:29 PM

## 2019-12-09 ENCOUNTER — Telehealth: Payer: Self-pay

## 2019-12-09 NOTE — Telephone Encounter (Signed)
Transition Care Management Follow-up Telephone Call Call completed with assistance of [place] Interpreter # [phone].  Date of discharge and from where: 12/06/2019, [facility]   How have you been since you were released from the hospital? She said she is making progress and feeling much better today, more independent.   Any questions or concerns? No  Items Reviewed:  Did the pt receive and understand the discharge instructions provided? Yes  she said that [place], RN CN met her at home after she was discharged and reviewed everything for her and set up her medications.   Medications obtained and verified? Yes  -as noted above, the medications were set up by the congregational nurse.  The patient had no questions about her med regime.   Other? No   Any new allergies since your discharge? No   Do you have support at home? Yes  - husband. She stated she  also has neighbors who bring her oranges and bananas.   Home Care and Equipment/Supplies:  Were home health services ordered?yes If so, what is the name of the agency? [facility] Has the agency set up a time to come to the patient's home? Yes, she was seen by the therapist  12/08/2019.  Were any new equipment or medical supplies ordered?  Yes: she has the RW and 3:1 commode What is the name of the medical supply agency? She was not sure.  Were you able to get the supplies/equipment? Yes Do you have any questions related to the use of the equipment or supplies? No  Functional Questionnaire: (I = Independent and D = Dependent) ADLs:her husband provides assistance as needed  Follow up appointments reviewed:   PCP Hospital f/u appt confirmed? Yes  - she wanted to schedule an appointment with Dr Chapman Fitch prior to the appointment that has been scheduled for 01/2020.  Appointment with Dr Chapman Fitch 12/16/2019  [facility] f/u appt confirmed? Yes , Dr Marlou Sa - 12/18/2019  Are transportation arrangements needed? No - her caregiver  in the community, Early Tamala Julian, provides transportation.  If their condition worsens, is the pt aware to call PCP or go to the Emergency Dept.? Yes  Was the patient provided with contact information for the PCP's office or ED? yes, she was given the phone number for [facility] and she said that she would give it to her husband.   Was to pt encouraged to call back with questions or concerns? yes, and she was encouraged to have her husband and/or Early Tamala Julian call with questions.

## 2019-12-12 ENCOUNTER — Ambulatory Visit: Payer: No Typology Code available for payment source | Admitting: Obstetrics and Gynecology

## 2019-12-12 NOTE — Discharge Summary (Signed)
Physician Discharge Summary      Patient ID: [name] [name] MRN: [id number] DOB/AGE: November 10, 1950 69 y.o.  Admit date: 12/03/2019 Discharge date: 12/06/2019  Admission Diagnoses:  Active Problems:   S/P knee surgery   S/P total knee arthroplasty   Discharge Diagnoses:  Same  Surgeries: Procedure(s): RIGHT TOTAL KNEE ARTHROPLASTY on 12/03/2019   Consultants:   Discharged Condition: Stable  Hospital Course: [name] [name] is an 69 y.o. adult who was admitted 12/03/2019 with a chief complaint of right knee pain, and found to have a diagnosis of right knee OA.  They were brought to the operating room on 12/03/2019 and underwent the above named procedures.  Pt awoke from anesthesia without complication and was transferred to the floor. On POD1, patient was able to mobilize with PT but her pain was moderate and she noted dizziness with ambulation. This improved over the next several days to the point she was mobilizing easily in PT and without dizziness. She was discharged home on 12/06/19.  Pt will f/u with Dr. August Saucer in clinic in ~2 weeks.   Antibiotics given:  Anti-infectives (From admission, onward)   Start     Dose/Rate Route Frequency Ordered Stop   12/03/19 1545  ceFAZolin (ANCEF) IVPB 2g/100 mL premix        2 g 200 mL/hr over 30 Minutes Intravenous Every 8 hours 12/03/19 1528 12/04/19 0104   12/03/19 0742  vancomycin (VANCOCIN) 1-5 GM/200ML-% IVPB       Note to Pharmacy: Janeann Forehand  : cabinet override      12/03/19 0742 12/03/19 1959   12/03/19 0615  ceFAZolin (ANCEF) IVPB 2g/100 mL premix        2 g 200 mL/hr over 30 Minutes Intravenous On call to O.R. 12/03/19 4562 12/03/19 5638    .  Recent vital signs:  Vitals:   12/06/19 0212 12/06/19 0720  BP: 122/60 124/63  Pulse: 81 75  Resp: 17 17  Temp: 99.1 F (37.3 C) 98.1 F (36.7 C)  SpO2: 94% 98%    Recent laboratory studies:  Results for orders placed or performed during the hospital  encounter of 12/03/19  Glucose, capillary  Result Value Ref Range   Glucose-Capillary 59 (L) 70 - 99 mg/dL  Glucose, capillary  Result Value Ref Range   Glucose-Capillary 129 (H) 70 - 99 mg/dL  Glucose, capillary  Result Value Ref Range   Glucose-Capillary 126 (H) 70 - 99 mg/dL  Comprehensive metabolic panel  Result Value Ref Range   Sodium 138 135 - 145 mmol/L   Potassium 4.2 3.5 - 5.1 mmol/L   Chloride 104 98 - 111 mmol/L   CO2 23 22 - 32 mmol/L   Glucose, Bld 165 (H) 70 - 99 mg/dL   BUN 11 8 - 23 mg/dL   Creatinine, Ser 9.37 0.44 - 1.00 mg/dL   Calcium 8.5 (L) 8.9 - 10.3 mg/dL   Total Protein 6.3 (L) 6.5 - 8.1 g/dL   Albumin 2.9 (L) 3.5 - 5.0 g/dL   AST 19 15 - 41 U/L   ALT 17 0 - 44 U/L   Alkaline Phosphatase 55 38 - 126 U/L   Total Bilirubin 0.4 0.3 - 1.2 mg/dL   GFR, Estimated >34 >28 mL/min   Anion gap 11 5 - 15  Hemoglobin A1c  Result Value Ref Range   Hgb A1c MFr Bld 6.0 (H) 4.8 - 5.6 %   Mean Plasma Glucose 125.5 mg/dL  Glucose, capillary  Result Value Ref Range  Glucose-Capillary 91 70 - 99 mg/dL  Glucose, capillary  Result Value Ref Range   Glucose-Capillary 104 (H) 70 - 99 mg/dL  Glucose, capillary  Result Value Ref Range   Glucose-Capillary 124 (H) 70 - 99 mg/dL  Glucose, capillary  Result Value Ref Range   Glucose-Capillary 111 (H) 70 - 99 mg/dL  CBC  Result Value Ref Range   WBC 9.5 4.0 - 10.5 K/uL   RBC 4.23 3.87 - 5.11 MIL/uL   Hemoglobin 10.2 (L) 12.0 - 15.0 g/dL   HCT 40.9 (L) 36 - 46 %   MCV 75.9 (L) 80.0 - 100.0 fL   MCH 24.1 (L) 26.0 - 34.0 pg   MCHC 31.8 30.0 - 36.0 g/dL   RDW 81.1 (H) 91.4 - 78.2 %   Platelets 96 (L) 150 - 400 K/uL   nRBC 0.0 0.0 - 0.2 %  Glucose, capillary  Result Value Ref Range   Glucose-Capillary 137 (H) 70 - 99 mg/dL  Glucose, capillary  Result Value Ref Range   Glucose-Capillary 116 (H) 70 - 99 mg/dL  Glucose, capillary  Result Value Ref Range   Glucose-Capillary 115 (H) 70 - 99 mg/dL  Glucose, capillary   Result Value Ref Range   Glucose-Capillary 107 (H) 70 - 99 mg/dL  Glucose, capillary  Result Value Ref Range   Glucose-Capillary 148 (H) 70 - 99 mg/dL    Discharge Medications:   Allergies as of 12/06/2019   No Known Allergies     Medication List    STOP taking these medications   acetaminophen 325 MG tablet Commonly known as: TYLENOL     TAKE these medications   amLODipine 2.5 MG tablet Commonly known as: NORVASC Take 2 tablets (5 mg total) by mouth daily. To lower blood pressure   apixaban 5 MG Tabs tablet Commonly known as: Eliquis TAKE 1 TABLET (5 MG TOTAL) BY MOUTH 2 (TWO) TIMES DAILY. What changed:   how much to take  how to take this  when to take this  additional instructions   ascorbic acid 500 MG tablet Commonly known as: VITAMIN C Take 1 tablet (500 mg total) by mouth 2 (two) times daily.   diltiazem 120 MG 24 hr capsule Commonly known as: Cardizem CD Take 1 capsule (120 mg total) by mouth daily.   furosemide 20 MG tablet Commonly known as: LASIX Take 1 tablet (20 mg total) by mouth daily as needed. Prn swelling What changed:   reasons to take this  additional instructions   gabapentin 100 MG capsule Commonly known as: NEURONTIN Take 1 capsule (100 mg total) by mouth 3 (three) times daily.   glipiZIDE 5 MG tablet Commonly known as: GLUCOTROL Take 0.5 tablets (2.5 mg total) by mouth daily before breakfast. For high blood sugar   Ipratropium-Albuterol 20-100 MCG/ACT Aers respimat Commonly known as: COMBIVENT Inhale 2 puffs into the lungs 3 (three) times daily.   loratadine 10 MG tablet Commonly known as: CLARITIN Take 1 tablet (10 mg total) by mouth daily.   METAMUCIL PO Take 1 Scoop by mouth daily.   methocarbamol 500 MG tablet Commonly known as: ROBAXIN Take 1 tablet (500 mg total) by mouth every 8 (eight) hours as needed for muscle spasms.   oxyCODONE-acetaminophen 5-325 MG tablet Commonly known as: Percocet Take 1 tablet by  mouth every 4 (four) hours as needed for severe pain.   pantoprazole 40 MG tablet Commonly known as: PROTONIX Take 1 tablet (40 mg total) by mouth daily. To reduce stomach  acid   rosuvastatin 10 MG tablet Commonly known as: CRESTOR Take 1 tablet (10 mg total) by mouth daily. To lower cholesterol What changed: when to take this   triamcinolone cream 0.1 % Commonly known as: KENALOG APPLY TO DRY SKIN 1-2 TIMES DAILY SPARINGLY What changed: See the new instructions.   zinc sulfate 220 (50 Zn) MG capsule Take 1 capsule (220 mg total) by mouth daily.       Diagnostic Studies: DG Knee 1-2 Views Right  Result Date: 12/03/2019 CLINICAL DATA:  Status post right knee arthroplasty EXAM: RIGHT KNEE - 1-2 VIEW COMPARISON:  None. FINDINGS: Right knee arthroplasty in satisfactory position. Associated soft tissue gas. No fracture or dislocation is seen. IMPRESSION: Right knee arthroplasty in satisfactory position. Electronically Signed   By: Charline BillsSriyesh  Krishnan M.D.   On: 12/03/2019 11:52    Disposition: Discharge disposition: 01-Home or Self Care       Discharge Instructions    Call MD / Call 911   Complete by: As directed    If you experience chest pain or shortness of breath, CALL 911 and be transported to the hospital emergency room.  If you develope a fever above 101 F, pus (white drainage) or increased drainage or redness at the wound, or calf pain, call your surgeon's office.   Constipation Prevention   Complete by: As directed    Drink plenty of fluids.  Prune juice may be helpful.  You may use a stool softener, such as Colace (over the counter) 100 mg twice a day.  Use MiraLax (over the counter) for constipation as needed.   Diet - low sodium heart healthy   Complete by: As directed    Discharge instructions   Complete by: As directed    You may shower, dressing is waterproof.  Do not remove the dressing, we will remove it at your first post-op appointment.  Do not take a bath or  soak the knee in a tub or pool.  You may weightbear as you can tolerate on the operative leg with a walker.  Continue using the CPM machine 3 times per day for one hour each time, increasing the degrees of range of motion daily.  Use the blue cradle boot under your heel to work on getting your leg straight.  Do NOT put a pillow under your knee.  You will follow-up with Dr. August Saucerean in the clinic in 2 weeks at your given appointment date.    Dental Antibiotics:  In most cases prophylactic antibiotics for Dental procdeures after total joint surgery are not necessary.  Exceptions are as follows:  1. History of prior total joint infection  2. Severely immunocompromised (Organ Transplant, cancer chemotherapy, Rheumatoid biologic meds such as Humera)  3. Poorly controlled diabetes (A1C &gt; 8.0, blood glucose over 200)  If you have one of these conditions, contact your surgeon for an antibiotic prescription, prior to your dental procedure.   Increase activity slowly as tolerated   Complete by: As directed        Follow-up Information    [facility]. Go to.   Why: You are set up with a hospital follow up appointment for Thursday, 12/2 at 2:30 pm. Contact information: [address] [place] [phone]       [facility] Follow [facility].   Specialty: Home Health Services Why: [facility] calling you to set up home visits for physical therapy.  They will contact  you in the next 24-48 hours after your discharge to home to set up a few therapy visits at home. Contact information: [address] [place] [address] [phone]        [facility] Follow up.   Why: You will be provided with a 3:1 to go home with for your use at the house. Contact information: [address] [address] [phone]                Signed: Julieanne Cotton 12/12/2019,  8:13 AM

## 2019-12-13 ENCOUNTER — Telehealth: Payer: Self-pay

## 2019-12-13 NOTE — Telephone Encounter (Signed)
David with [facility] would like to change patient's frequency to 2 x week for 1 week and 1 x a week for 2 weeks.  Cb# [phone].  Please advise.  Thank you.

## 2019-12-16 ENCOUNTER — Other Ambulatory Visit: Payer: Self-pay | Admitting: Family Medicine

## 2019-12-16 ENCOUNTER — Ambulatory Visit: Payer: Self-pay | Attending: Family Medicine | Admitting: Family Medicine

## 2019-12-16 ENCOUNTER — Other Ambulatory Visit: Payer: Self-pay

## 2019-12-16 ENCOUNTER — Encounter: Payer: Self-pay | Admitting: Family Medicine

## 2019-12-16 VITALS — BP 129/75 | HR 75 | Ht 62.24 in | Wt 240.0 lb

## 2019-12-16 DIAGNOSIS — M7989 Other specified soft tissue disorders: Secondary | ICD-10-CM

## 2019-12-16 DIAGNOSIS — Z7901 Long term (current) use of anticoagulants: Secondary | ICD-10-CM

## 2019-12-16 DIAGNOSIS — Z23 Encounter for immunization: Secondary | ICD-10-CM

## 2019-12-16 DIAGNOSIS — R600 Localized edema: Secondary | ICD-10-CM

## 2019-12-16 DIAGNOSIS — E119 Type 2 diabetes mellitus without complications: Secondary | ICD-10-CM

## 2019-12-16 DIAGNOSIS — Z96651 Presence of right artificial knee joint: Secondary | ICD-10-CM

## 2019-12-16 DIAGNOSIS — M79604 Pain in right leg: Secondary | ICD-10-CM

## 2019-12-16 DIAGNOSIS — Z09 Encounter for follow-up examination after completed treatment for conditions other than malignant neoplasm: Secondary | ICD-10-CM

## 2019-12-16 MED ORDER — FUROSEMIDE 20 MG PO TABS: ORAL_TABLET | ORAL | 0 refills | Status: DC

## 2019-12-16 NOTE — Progress Notes (Signed)
PT EXPERIENCING EDEMA IN RIGHT FOOT [facility]

## 2019-12-16 NOTE — Telephone Encounter (Signed)
IC verbal given.  

## 2019-12-16 NOTE — Progress Notes (Signed)
Established Patient Office Visit  Subjective:  Patient ID: [name] [name], adult    DOB: 03/14/1950  Age: 69 y.o. MRN: [id number]  Due to language barrier, patient is accompanied by someone to help with interpretation  CC:  Chief Complaint  Patient presents with  . Hospitalization Follow-up    RIGHT FOOT EDEMA     HPI [name] [name], 69 yo female who presents s/p right knee replacement on 12/03/2019 and has follow-up with Orthopedics on 12/18/2019. Patient with Hgb A1c during hospitalization of 6.0. Creatinine was normal at 0.94 on 12/04/2019.         Patient reports that she has had increased swelling in her right lower leg and foot since last week.  She was wearing compression stockings which she feels made the swelling worse.  (She initially reported no pain in the lower leg or foot however during examination, she had pain with palpation of the right posterior calf).  She reports that she did have home health come check on her a few times and the last home health visit was this past Tuesday.  She reports that she still has the bandage that was placed on her leg after her surgery and she is not sure if she needs to remove this.  She does have an upcoming appointment this week with her orthopedic surgeon.  She denies any issues with increased thirst or frequent urination.  She did have constipation with no bowel movement for about 4 days after coming home from the hospital but since then bowel movements have been more regular and stools are not hard.  She remains compliant with all medications.  She denies any headaches or dizziness related to blood pressure.  She continues to take glipizide for her diabetes.  She is also on blood thinning medicine, Eliquis, and denies any unusual bruising or bleeding.  Past Medical History:  Diagnosis Date  . Acute kidney injury (HCC) 03/02/2019  . Acute respiratory failure with hypoxia (HCC) 02/22/2019  . Atrial fibrillation with RVR (HCC)  02/26/2019  . COVID-19 02/22/2019  . Flu-like symptoms 02/22/2019  . Hypertension   . Knee osteoarthritis 10/24/2017    Past Surgical History:  Procedure Laterality Date  . NO PAST SURGERIES    . TOTAL KNEE ARTHROPLASTY Right 12/03/2019   Procedure: RIGHT TOTAL KNEE ARTHROPLASTY;  Surgeon: Cammy Copaean, Gregory Scott, MD;  Location: [facility] OR;  Service: Orthopedics;  Laterality: Right;    Family History  Family history unknown: Yes    Social History   Socioeconomic History  . Marital status: Widowed    Spouse name: Not on file  . Number of children: Not on file  . Years of education: Not on file  . Highest education level: Not on file  Occupational History  . Not on file  Tobacco Use  . Smoking status: Never Smoker  . Smokeless tobacco: Never Used  Vaping Use  . Vaping Use: Never used  Substance and Sexual Activity  . Alcohol use: Never  . Drug use: Never  . Sexual activity: Not Currently    Birth control/protection: Post-menopausal  Other Topics Concern  . Not on file  Social History Narrative  . Not on file   Social Determinants of Health   Financial Resource Strain:   . Difficulty of Paying Living Expenses: Not on file  Food Insecurity:   . Worried About Programme researcher, broadcasting/film/videounning Out of Food in the Last Year: Not on file  . Ran Out of Food in the Last Year: Not  on file  Transportation Needs: No Transportation Needs  . Lack of Transportation (Medical): No  . Lack of Transportation (Non-Medical): No  Physical Activity:   . Days of Exercise per Week: Not on file  . Minutes of Exercise per Session: Not on file  Stress:   . Feeling of Stress : Not on file  Social Connections:   . Frequency of Communication with Friends and Family: Not on file  . Frequency of Social Gatherings with Friends and Family: Not on file  . Attends Religious Services: Not on file  . Active Member of Clubs or Organizations: Not on file  . Attends Banker Meetings: Not on file  . Marital Status: Not on  file  Intimate Partner Violence:   . Fear of Current or Ex-Partner: Not on file  . Emotionally Abused: Not on file  . Physically Abused: Not on file  . Sexually Abused: Not on file    Outpatient Medications Prior to Visit  Medication Sig Dispense Refill  . amLODipine (NORVASC) 2.5 MG tablet Take 2 tablets (5 mg total) by mouth daily. To lower blood pressure 180 tablet 3  . apixaban (ELIQUIS) 5 MG TABS tablet TAKE 1 TABLET (5 MG TOTAL) BY MOUTH 2 (TWO) TIMES DAILY. (Patient taking differently: Take 5 mg by mouth 2 (two) times daily. ) 60 tablet 2  . diltiazem (CARDIZEM CD) 120 MG 24 hr capsule Take 1 capsule (120 mg total) by mouth daily. 30 capsule 11  . gabapentin (NEURONTIN) 100 MG capsule Take 1 capsule (100 mg total) by mouth 3 (three) times daily. 270 capsule 3  . glipiZIDE (GLUCOTROL) 5 MG tablet Take 0.5 tablets (2.5 mg total) by mouth daily before breakfast. For high blood sugar 30 tablet 6  . loratadine (CLARITIN) 10 MG tablet Take 1 tablet (10 mg total) by mouth daily. 30 tablet 0  . methocarbamol (ROBAXIN) 500 MG tablet Take 1 tablet (500 mg total) by mouth every 8 (eight) hours as needed for muscle spasms. 30 tablet 0  . oxyCODONE-acetaminophen (PERCOCET) 5-325 MG tablet Take 1 tablet by mouth every 4 (four) hours as needed for severe pain. 30 tablet 0  . pantoprazole (PROTONIX) 40 MG tablet Take 1 tablet (40 mg total) by mouth daily. To reduce stomach acid 90 tablet 3  . Psyllium (METAMUCIL PO) Take 1 Scoop by mouth daily.    . rosuvastatin (CRESTOR) 10 MG tablet Take 1 tablet (10 mg total) by mouth daily. To lower cholesterol (Patient taking differently: Take 10 mg by mouth at bedtime. To lower cholesterol) 90 tablet 1  . triamcinolone cream (KENALOG) 0.1 % APPLY TO DRY SKIN 1-2 TIMES DAILY SPARINGLY (Patient taking differently: Apply 1 application topically See admin instructions. Apply to dry skin 1-2 times daily sparingly) 30 g 2  . furosemide (LASIX) 20 MG tablet Take 1 tablet  (20 mg total) by mouth daily as needed. Prn swelling (Patient taking differently: Take 20 mg by mouth daily as needed for edema. ) 30 tablet 1  . ascorbic acid (VITAMIN C) 500 MG tablet Take 1 tablet (500 mg total) by mouth 2 (two) times daily. (Patient not taking: Reported on 12/16/2019) 60 tablet 0  . Ipratropium-Albuterol (COMBIVENT) 20-100 MCG/ACT AERS respimat Inhale 2 puffs into the lungs 3 (three) times daily. (Patient not taking: Reported on 11/28/2019) 4 g 0  . zinc sulfate 220 (50 Zn) MG capsule Take 1 capsule (220 mg total) by mouth daily. (Patient not taking: Reported on 11/28/2019) 30 capsule 0  No facility-administered medications prior to visit.    No Known Allergies  ROS Review of Systems  Constitutional: Negative for chills and fever.  HENT: Negative for sore throat and trouble swallowing.   Respiratory: Negative for cough and shortness of breath.   Cardiovascular: Positive for leg swelling. Negative for chest pain and palpitations.  Gastrointestinal: Negative for abdominal pain, blood in stool, constipation, diarrhea and nausea.  Endocrine: Negative for polydipsia, polyphagia and polyuria.  Genitourinary: Negative for dysuria and frequency.  Musculoskeletal: Positive for arthralgias, gait problem and joint swelling.  Skin: Negative for rash and wound.  Neurological: Negative for dizziness and headaches.  Hematological: Negative for adenopathy. Does not bruise/bleed easily.  Psychiatric/Behavioral: Negative for sleep disturbance. The patient is not nervous/anxious.       Objective:    Physical Exam Vitals and nursing note reviewed.  Constitutional:      General: She is not in acute distress.    Appearance: Normal appearance.     Comments: Well-nourished well-developed elderly female in no acute distress sitting on a chair in the exam room with a walker nearby.  Patient is accompanied by an interpreter at today's visit.  Cardiovascular:     Rate and Rhythm: Normal  rate and regular rhythm.     Comments: Palpable right dorsalis pedis and posterior tibial pulses Pulmonary:     Effort: Pulmonary effort is normal.     Breath sounds: Normal breath sounds.  Abdominal:     Palpations: Abdomen is soft.     Tenderness: There is no abdominal tenderness. There is no guarding or rebound.  Musculoskeletal:        General: Tenderness present.     Right lower leg: Edema present.     Left lower leg: No edema.     Comments: Patient with swelling in the right leg from slightly above the knee down including the foot; pain with palpation/light squeezing of the posterior calf  Skin:    General: Skin is warm and dry.     Comments: Patient with vertical bandage in place from the right lower thigh and over the knee. No cyanosis of the right LE or foot  Neurological:     General: No focal deficit present.     Mental Status: She is alert and oriented to person, place, and time.  Psychiatric:        Mood and Affect: Mood normal.        Behavior: Behavior normal.     BP 129/75 (BP Location: Left Arm, Patient Position: Sitting)   Pulse 75   Ht 5' 2.24" (1.581 m)   Wt 240 lb (108.9 kg)   LMP  (LMP Unknown)   SpO2 94%   BMI 43.55 kg/m  Wt Readings from Last 3 Encounters:  12/16/19 240 lb (108.9 kg)  12/03/19 218 lb 6.4 oz (99.1 kg)  11/28/19 218 lb 6.4 oz (99.1 kg)     Health Maintenance Due  Topic Date Due  . FOOT EXAM  Never done  . OPHTHALMOLOGY EXAM  Never done  . URINE MICROALBUMIN  Never done  . COLONOSCOPY  Never done  . DEXA SCAN  Never done    Lab Results  Component Value Date   TSH 0.956 10/11/2019   Lab Results  Component Value Date   WBC 9.5 12/05/2019   HGB 10.2 (L) 12/05/2019   HCT 32.1 (L) 12/05/2019   MCV 75.9 (L) 12/05/2019   PLT 96 (L) 12/05/2019   Lab Results  Component Value  Date   NA 138 12/04/2019   K 4.2 12/04/2019   CO2 23 12/04/2019   GLUCOSE 165 (H) 12/04/2019   BUN 11 12/04/2019   CREATININE 0.94 12/04/2019    BILITOT 0.4 12/04/2019   ALKPHOS 55 12/04/2019   AST 19 12/04/2019   ALT 17 12/04/2019   PROT 6.3 (L) 12/04/2019   ALBUMIN 2.9 (L) 12/04/2019   CALCIUM 8.5 (L) 12/04/2019   ANIONGAP 11 12/04/2019   Lab Results  Component Value Date   CHOL 211 (H) 08/08/2018   Lab Results  Component Value Date   HDL 45 08/08/2018   Lab Results  Component Value Date   LDLCALC 150 (H) 08/08/2018   Lab Results  Component Value Date   TRIG 102 02/22/2019   Lab Results  Component Value Date   CHOLHDL 4.7 (H) 08/08/2018   Lab Results  Component Value Date   HGBA1C 6.0 (H) 12/04/2019      Assessment & Plan:  1. Need for immunization against influenza Patient received influenza immunization at today's visit - Flu Vaccine QUAD 36+ mos IM  2. Hospital discharge follow-up 3. Status post right knee replacement Patient is status post hospital discharge from 12/03/2019 through 12/06/2019 due to right total knee replacement on 12/03/2019.  At today's visit she reports that she is feeling well other than recent onset of right lower extremity swelling.  She continues to be on blood thinning medication.  She has follow-up appointment later this week with her orthopedic surgeon.  She did have home health follow-up with last visit this past Tuesday.  Patient did have concern regarding whether or not the bandage that was placed on her leg post surgery was to be removed and message was sent to patient's orthopedic surgeon but she was told otherwise leave the bandage in place until she has her follow-up appointment. - CBC with Differential  4. Pain and swelling of lower extremity, right; 6.  Leg edema, right Patient with pain and swelling of the right lower extremity status post right knee replacement surgery.  She has had some issues in the past with swelling of this leg but on exam today, she also has some tenderness and since she is status post recent surgery, she is being scheduled for vascular ultrasound of  lower extremity to look for possible DVT.  She has been provided with refill of Lasix that she is taken in the past and has been instructed to take 1 pill daily x3 days and then as needed for swelling.  She is to keep her orthopedic appointment for 12/18/2019 and message regarding her swelling and scheduling for venous Doppler was sent to her orthopedic doctor as well as patient is concerned as to whether or not she needs to remove the bandage placed on her knee status post surgery. - VAS [place] LOWER EXTREMITY VENOUS (DVT); Future - CBC with Differential - furosemide (LASIX) 20 MG tablet; Take one pill daily for 3 days then as needed for swelling  Dispense: 10 tablet; Refill: 0  5. Controlled type 2 diabetes mellitus without complication, without long-term current use of insulin (HCC) Patient's hemoglobin A1c during recent hospitalization was very good at 6.0.  She will continue use of glipizide and a low carbohydrate diet.  She will have basic metabolic panel at today's visit. - Basic Metabolic Panel  7. Long term current use of anticoagulant She is currently on anticoagulant therapy with Eliquis and is status post recent right knee replacement.  She will have CBC done  at today's visit  - CBC with Differential    Follow-up: Return in about 3 weeks (around 01/06/2020) for chronic issues; keep appt this week with Orthopedics.    Cain Saupe, MD

## 2019-12-17 ENCOUNTER — Other Ambulatory Visit: Payer: Self-pay | Admitting: Family Medicine

## 2019-12-17 ENCOUNTER — Ambulatory Visit (HOSPITAL_COMMUNITY)
Admission: RE | Admit: 2019-12-17 | Discharge: 2019-12-17 | Disposition: A | Discharge status: Home / Self Care | Payer: Self-pay | Source: Ambulatory Visit | Attending: Family Medicine | Admitting: Family Medicine

## 2019-12-17 ENCOUNTER — Telehealth: Payer: Self-pay

## 2019-12-17 DIAGNOSIS — M79604 Pain in right leg: Secondary | ICD-10-CM

## 2019-12-17 DIAGNOSIS — M7989 Other specified soft tissue disorders: Secondary | ICD-10-CM

## 2019-12-17 LAB — BASIC METABOLIC PANEL WITH GFR
BUN/Creatinine Ratio: 10 — ABNORMAL LOW (ref 12–28)
BUN: 9 mg/dL (ref 8–27)
CO2: 21 mmol/L (ref 20–29)
Calcium: 8.7 mg/dL (ref 8.7–10.3)
Chloride: 106 mmol/L (ref 96–106)
Creatinine, Ser: 0.94 mg/dL (ref 0.57–1.00)
GFR calc Af Amer: 71 mL/min/1.73
GFR calc non Af Amer: 62 mL/min/1.73
Glucose: 55 mg/dL — ABNORMAL LOW (ref 65–99)
Potassium: 4.2 mmol/L (ref 3.5–5.2)
Sodium: 143 mmol/L (ref 134–144)

## 2019-12-17 LAB — CBC WITH DIFFERENTIAL/PLATELET
Basophils Absolute: 0 x10E3/uL (ref 0.0–0.2)
Basos: 0 %
EOS (ABSOLUTE): 0.1 x10E3/uL (ref 0.0–0.4)
Eos: 1 %
Hematocrit: 33 % — ABNORMAL LOW (ref 34.0–46.6)
Hemoglobin: 10.3 g/dL — ABNORMAL LOW (ref 11.1–15.9)
Immature Grans (Abs): 0 x10E3/uL (ref 0.0–0.1)
Immature Granulocytes: 0 %
Lymphocytes Absolute: 2.1 x10E3/uL (ref 0.7–3.1)
Lymphs: 31 %
MCH: 25.2 pg — ABNORMAL LOW (ref 26.6–33.0)
MCHC: 31.2 g/dL — ABNORMAL LOW (ref 31.5–35.7)
MCV: 81 fL (ref 79–97)
Monocytes Absolute: 0.6 x10E3/uL (ref 0.1–0.9)
Monocytes: 10 %
Neutrophils Absolute: 3.9 x10E3/uL (ref 1.4–7.0)
Neutrophils: 58 %
Platelets: 274 x10E3/uL (ref 150–450)
RBC: 4.09 x10E6/uL (ref 3.77–5.28)
RDW: 17.9 % — ABNORMAL HIGH (ref 11.7–15.4)
WBC: 6.8 x10E3/uL (ref 3.4–10.8)

## 2019-12-17 NOTE — Progress Notes (Signed)
Right lower extremity venous duplex has been completed. Preliminary results can be found in CV Proc through chart review.  Results were given to [facility] at Dr. Debroah Baller office.  12/17/19 10:46 AM Olen Cordial RVT

## 2019-12-17 NOTE — Telephone Encounter (Signed)
Greg from [facility] called to advise that pt is negative for DVT. Pt has been notified

## 2019-12-18 ENCOUNTER — Encounter: Payer: Self-pay | Admitting: Pediatric Intensive Care

## 2019-12-18 ENCOUNTER — Telehealth: Payer: Self-pay

## 2019-12-18 ENCOUNTER — Ambulatory Visit (INDEPENDENT_AMBULATORY_CARE_PROVIDER_SITE_OTHER): Payer: No Typology Code available for payment source | Admitting: Orthopedic Surgery

## 2019-12-18 ENCOUNTER — Ambulatory Visit (INDEPENDENT_AMBULATORY_CARE_PROVIDER_SITE_OTHER): Payer: Self-pay

## 2019-12-18 ENCOUNTER — Other Ambulatory Visit: Payer: Self-pay | Admitting: Surgical

## 2019-12-18 DIAGNOSIS — Z96651 Presence of right artificial knee joint: Secondary | ICD-10-CM

## 2019-12-18 MED ORDER — OXYCODONE-ACETAMINOPHEN 5-325 MG PO TABS: 1.0000 | ORAL_TABLET | Freq: Three times a day (TID) | ORAL | 0 refills | Status: DC | PRN

## 2019-12-18 NOTE — Telephone Encounter (Signed)
Patient seen by Dr August Saucer today. Dr August Saucer wants refill on oxy 5mg  1 po q 6-8hrs prn pain #40 [facility].

## 2019-12-18 NOTE — Telephone Encounter (Signed)
Sent in

## 2019-12-19 NOTE — Congregational Nurse Program (Signed)
  Dept: [phone]   Congregational Nurse Program Note  Date of Encounter: 12/18/2019  Past Medical History: Past Medical History:  Diagnosis Date  . Acute kidney injury (HCC) 03/02/2019  . Acute respiratory failure with hypoxia (HCC) 02/22/2019  . Atrial fibrillation with RVR (HCC) 02/26/2019  . COVID-19 02/22/2019  . Flu-like symptoms 02/22/2019  . Hypertension   . Knee osteoarthritis 10/24/2017    Encounter Details:Post-op home visit. [place] interpretation via [employer] interpreter [phone] Carnes.  Medication assistance- client is to take 3 days of furosemide 20mg . Medication loaded in pills boxes. Client asks about refills of oxycodone. CN advises client to take 650mg  acetominiphen every 4-6hours as needed for pain instead of oxycodone to see if she can tolerate. Client verifies that she has appointment with ortho for follow up tomorrow. Client has pitting edema right foot, and lower leg. Calf is not painful. Client understands diuretic therapy for next 3 days. Follow up on Tuesday 11/22. RN BSN CNP [phone]

## 2019-12-21 ENCOUNTER — Encounter: Payer: Self-pay | Admitting: Orthopedic Surgery

## 2019-12-21 NOTE — Progress Notes (Signed)
   Post-Op Visit Note   Patient: [name] [name]           Date of Birth: [birth year]           MRN: [id number] Visit Date: 12/18/2019 PCP: Cain Saupe, MD   Assessment & Plan:  Chief Complaint:  Chief Complaint  Patient presents with  . Right Knee - Routine Post Op   Visit Diagnoses:  1. Status post total right knee replacement     Plan: Patient presents for follow-up 2 weeks out right total knee replacement for varus alignment.  No CPM.  Doing home health physical therapy.  Ambulating with a walker.  Medium level pain.  Oxycodone refilled.  Start physical therapy outpatient 2-3 times a week.  Needs standing rotationally correct AP view only bilateral knees next clinic visit.  The current radiographs we have have some rotational issues.  Immediate postop radiographs showed good alignment.  I want to see how that looks with her standing.  Visually the leg is straighter but may have a little bit of residual varus deformity.  Negative Homans no calf tenderness today.  Follow-Up Instructions: No follow-ups on file.   Orders:  Orders Placed This Encounter  Procedures  . XR Knee 1-2 Views Right  . Ambulatory referral to Physical Therapy   No orders of the defined types were placed in this encounter.   Imaging: No results found.  PMFS History: Patient Active Problem List   Diagnosis Date Noted  . 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 S/P knee surgery 12/03/2019  . 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 Acute kidney injury (HCC) 03/02/2019  . Atrial fibrillation with RVR (HCC) 02/26/2019  . Acute respiratory failure with hypoxia (HCC) 02/22/2019  . COVID-19 02/22/2019  . Essential hypertension, benign 05/03/2018  . Hypertension 10/24/2017  . Knee osteoarthritis 10/24/2017   Past Medical History:  Diagnosis Date  . Acute kidney injury (HCC) 03/02/2019  . Acute respiratory failure with hypoxia (HCC) 02/22/2019  . Atrial fibrillation with RVR  (HCC) 02/26/2019  . COVID-19 02/22/2019  . Flu-like symptoms 02/22/2019  . Hypertension   . Knee osteoarthritis 10/24/2017    Family History  Family history unknown: Yes    Past Surgical History:  Procedure Laterality Date  . NO PAST SURGERIES    . TOTAL KNEE ARTHROPLASTY Right 12/03/2019   Procedure: RIGHT TOTAL KNEE ARTHROPLASTY;  Surgeon: Cammy Copa, MD;  Location: [facility] OR;  Service: Orthopedics;  Laterality: Right;   Social History   Occupational History  . Not on file  Tobacco Use  . Smoking status: Never Smoker  . Smokeless tobacco: Never Used  Vaping Use  . Vaping Use: Never used  Substance and Sexual Activity  . Alcohol use: Never  . Drug use: Never  . Sexual activity: Not Currently    Birth control/protection: Post-menopausal

## 2019-12-23 ENCOUNTER — Other Ambulatory Visit: Payer: Self-pay | Admitting: Surgical

## 2019-12-23 MED ORDER — METHOCARBAMOL 500 MG PO TABS: 500.0000 mg | ORAL_TABLET | Freq: Three times a day (TID) | ORAL | 0 refills | Status: DC | PRN

## 2019-12-23 MED ORDER — OXYCODONE-ACETAMINOPHEN 5-325 MG PO TABS: 1.0000 | ORAL_TABLET | Freq: Three times a day (TID) | ORAL | 0 refills | Status: DC | PRN

## 2019-12-24 ENCOUNTER — Other Ambulatory Visit: Payer: Self-pay

## 2019-12-24 ENCOUNTER — Telehealth: Payer: Self-pay | Admitting: Pediatric Intensive Care

## 2019-12-24 ENCOUNTER — Ambulatory Visit (INDEPENDENT_AMBULATORY_CARE_PROVIDER_SITE_OTHER): Payer: Self-pay | Admitting: Rehabilitative and Restorative Service Providers"

## 2019-12-24 ENCOUNTER — Encounter: Payer: Self-pay | Admitting: Rehabilitative and Restorative Service Providers"

## 2019-12-24 ENCOUNTER — Telehealth: Payer: Self-pay | Admitting: Orthopedic Surgery

## 2019-12-24 DIAGNOSIS — M25661 Stiffness of right knee, not elsewhere classified: Secondary | ICD-10-CM

## 2019-12-24 DIAGNOSIS — M6281 Muscle weakness (generalized): Secondary | ICD-10-CM

## 2019-12-24 DIAGNOSIS — R600 Localized edema: Secondary | ICD-10-CM

## 2019-12-24 DIAGNOSIS — M25561 Pain in right knee: Secondary | ICD-10-CM

## 2019-12-24 DIAGNOSIS — G8929 Other chronic pain: Secondary | ICD-10-CM

## 2019-12-24 DIAGNOSIS — R262 Difficulty in walking, not elsewhere classified: Secondary | ICD-10-CM

## 2019-12-24 NOTE — Telephone Encounter (Signed)
She requested the pain medicine be sent here. I sent her refill to [facility] in the past, I can sent it there again but honestly the refill may still be there from several days ago

## 2019-12-24 NOTE — Telephone Encounter (Signed)
Verlon Au pharmacist from [facility] called stating that they don't fill narcotic medications and patient's medication oxycodone will have to be sent to another pharmacy. Please contact patient to ask about another pharmacy of patient's choice. Verlon Au phone number is [phone]. Patient phone number is [phone].

## 2019-12-24 NOTE — Telephone Encounter (Signed)
Call to [facility] to cancel home PT visit for this afternoon. Shann Medal RN BSN CNP [phone]

## 2019-12-24 NOTE — Telephone Encounter (Signed)
Please advise. Thanks.  

## 2019-12-24 NOTE — Therapy (Signed)
[facility] Physical Therapy [address] [place], [place], [address] Phone: [phone]   Fax:  [phone]  Physical Therapy Evaluation  Patient Details  Name: [name] [name] MRN: [id number] Date of Birth: [birth year] Referring Provider (PT): Dr. August Saucer   Encounter Date: 12/24/2019   PT End of Session - 12/24/19 1254    Visit Number 1    Number of Visits 12    Date for PT Re-Evaluation 03/03/20    Progress Note Due on Visit 10    PT Start Time 1150    PT Stop Time 1230    PT Time Calculation (min) 40 min    Activity Tolerance Patient tolerated treatment well    Behavior During Therapy [facility] for tasks assessed/performed           Past Medical History:  Diagnosis Date   Acute kidney injury (HCC) 03/02/2019   Acute respiratory failure with hypoxia (HCC) 02/22/2019   Atrial fibrillation with RVR (HCC) 02/26/2019   COVID-19 02/22/2019   Flu-like symptoms 02/22/2019   Hypertension    Knee osteoarthritis 10/24/2017    Past Surgical History:  Procedure Laterality Date   NO PAST SURGERIES     TOTAL KNEE ARTHROPLASTY Right 12/03/2019   Procedure: RIGHT TOTAL KNEE ARTHROPLASTY;  Surgeon: Cammy Copa, MD;  Location: [facility] OR;  Service: Orthopedics;  Laterality: Right;    There were no vitals filed for this visit.    Subjective Assessment - 12/24/19 1200    Subjective S/P Rt TKA 12/03/2019.  Pt. indicated having difficulty walking and having swelling.    Patient is accompained by: Interpreter    Limitations Sitting;Standing;Walking    Currently in Pain? Yes    Pain Score 8     Pain Location Knee    Pain Orientation Right    Pain Descriptors / Indicators Aching;Tightness;Constant    Pain Type Surgical pain    Pain Onset 1 to 4 weeks ago    Pain Frequency Constant    Aggravating Factors  constant pain without medicine    Pain Relieving Factors medicine at times    Effect of Pain on Daily Activities Unable to walk independent                [facility] PT Assessment - 12/24/19 0001      Assessment   Medical Diagnosis Rt TKA 12/03/2019    Referring Provider (PT) Dr. August Saucer    Onset Date/Surgical Date 12/03/19      Precautions   Precautions None      Restrictions   Weight Bearing Restrictions No      Balance Screen   Has the patient fallen in the past 6 months No      Home Environment   Living Environment Private residence    Home Access Level entry    Additional Comments Had help with food, at home alone at night.  Stairs indicated for entry 5 stairs c rail      Prior Function   Level of Independence Independent    Leisure Walking c cane prior to surgery      Cognition   Overall Cognitive Status Within Functional Limits for tasks assessed      Observation/Other Assessments   Focus on Therapeutic Outcomes (FOTO)  Intake 36%, outcome expected 59%      Functional Tests   Functional tests Single leg stance      Single Leg Stance   Comments unable to perform      ROM / Strength  AROM / PROM / Strength PROM;Strength;AROM      AROM   AROM Assessment Site Knee    Right/Left Knee Left;Right    Right Knee Extension -10    Right Knee Flexion 65      PROM   PROM Assessment Site Knee    Right/Left Knee Left;Right    Right Knee Extension 0    Right Knee Flexion 70      Strength   Strength Assessment Site Ankle;Knee;Hip    Right/Left Hip Left;Right    Right Hip Flexion 4/5    Left Hip Flexion 5/5    Right/Left Knee Left;Right    Right Knee Flexion 4/5    Right Knee Extension 5/5    Left Knee Flexion 5/5    Left Knee Extension 5/5    Right/Left Ankle Left;Right    Right Ankle Dorsiflexion 5/5    Left Ankle Dorsiflexion 5/5      Transfers   Comments sit to stand with UE assist from wheelchair      Ambulation/Gait   Gait Comments FWW , step to gait pattern, reduced stance on Rt LE, minimal toe off progression.  FWW 50 ft in clinic c SBA                      Objective measurements  completed on examination: See above findings.       [facility] Adult PT Treatment/Exercise - 12/24/19 0001      Exercises   Exercises Other Exercises;Knee/Hip    Other Exercises  HEP instruction and performance 1 set of each c additional time spent on education and performance. HEP consisted of supine slr, heel slide c strap and without, LAQ                  PT Education - 12/24/19 1255    Education Details HEP, POC    Person(s) Educated Patient    Methods Explanation;Demonstration;Verbal cues;Handout    Comprehension Verbalized understanding;Returned demonstration;Verbal cues required;Need further instruction               PT Long Term Goals - 12/24/19 1249      PT LONG TERM GOAL #1   Title Patient will demonstrate/report pain at worst less than or equal to 2/10 to facilitate minimal limitation in daily activity secondary to pain symptoms.    Time 10    Period Weeks    Status New    Target Date 03/03/20      PT LONG TERM GOAL #2   Title Patient will demonstrate independent use of home exercise program to facilitate ability to maintain/progress functional gains from skilled physical therapy services.    Time 10    Period Weeks    Status New    Target Date 03/03/20      PT LONG TERM GOAL #3   Title Patient will demonstrate independent ambulation community distances > 300 ft to facilitate [facility].    Time 10    Period Weeks    Status New    Target Date 03/03/20      PT LONG TERM GOAL #4   Title Pt. will demonstrate Rt knee AORM 0-110 deg to facilitate ability to perform transfers, walking, stairs at PLOF s limitation.    Time 10    Period Weeks    Status New    Target Date 03/03/20      PT LONG TERM GOAL #5   Title Pt. will demonstrate Rt  knee MMT 5/5 throughout to facilitate independent ambulation, stair navigation, transfers at PLOF.    Time 10    Period Weeks    Status New    Target Date 03/03/20      Additional Long Term Goals     Additional Long Term Goals Yes      PT LONG TERM GOAL #6   Title Pt. will demonstrate FOTO 59% outcome.    Time 10    Period Weeks    Status New    Target Date 03/03/20                  Plan - 12/24/19 1229    Clinical Impression Statement Patient is a 69 y.o. female who comes to clinic with complaints of Rt knee pain s/p Rt TKA with mobility, strength and movement coordination deficits that impair their ability to perform usual daily and recreational functional activities without increase difficulty/symptoms at this time.  Patient to benefit from skilled PT services to address impairments and limitations to improve to previous level of function without restriction secondary to condition.    Personal Factors and Comorbidities Comorbidity 1    Comorbidities HTN    Examination-Activity Limitations Sit;Sleep;Squat;Stairs;Stand;Carry;Bend;Transfers;Dressing;Hygiene/Grooming;Lift;Locomotion Level;Reach Overhead    Examination-Participation Restrictions Laundry;Other   home activity   Stability/Clinical Decision Making Stable/Uncomplicated    Clinical Decision Making Low    Rehab Potential Good    PT Frequency 2x / week    PT Duration --   10 weeks   PT Treatment/Interventions ADLs/Self Care Home Management;Cryotherapy;Electrical Stimulation;Iontophoresis 4mg /ml Dexamethasone;Moist Heat;Balance training;Therapeutic exercise;Therapeutic activities;Functional mobility training;Stair training;Gait training;Ultrasound;Neuromuscular re-education;Patient/family education;Manual techniques;Joint Manipulations;Spinal Manipulations;Passive range of motion;Dry needling;Vasopneumatic Device;Taping    PT Next Visit Plan Manual for mobility, strengthening/balance, vaso for swelling    PT Home Exercise Plan [email or web address]    Consulted and Agree with Plan of Care Patient           Patient will benefit from skilled therapeutic intervention in order to improve the following deficits and impairments:   Abnormal gait, Decreased endurance, Hypomobility, Decreased activity tolerance, Decreased strength, Pain, Increased fascial restricitons, Increased muscle spasms, Difficulty walking, Decreased mobility, Decreased balance, Decreased range of motion, Improper body mechanics, Impaired flexibility, Decreased coordination, Impaired perceived functional ability  Visit Diagnosis: Chronic pain of right knee  Muscle weakness (generalized)  Stiffness of right knee, not elsewhere classified  Difficulty in walking, not elsewhere classified  Localized edema     Problem List Patient Active Problem List   Diagnosis Date Noted   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 [place], [place], [place] Phone: [phone]   Fax:  [phone]  Name: [name] [name] MRN: Avelina Laine Date of Birth: December 28, 1950

## 2019-12-24 NOTE — Patient Instructions (Signed)
Access Code: [email or web address] URL: [email or web address] Date: 12/24/2019 Prepared by: Chyrel Masson  Exercises Small Range Straight Leg Raise - 2 x daily - 7 x weekly - 3 sets - 10 reps Supine Heel Slide - 2 x daily - 7 x weekly - 3 sets - 10 reps - 2 hold Seated Long Arc Quad - 2 x daily - 7 x weekly - 3 sets - 10 reps - 2 hold Supine Heel Slide with Strap - 3 x daily - 7 x weekly - 1 sets - 10 reps - 10 hold

## 2019-12-24 NOTE — Telephone Encounter (Signed)
Call from client's husband Jed. Client has home PT appointment scheduled fro this afternoon and need to cancel. He does not have the number for service provider. CN will call Frances Furbish to cancel PT for today. Shann Medal Rn BSN CNP [phone]

## 2019-12-24 NOTE — Addendum Note (Signed)
Addended by: Chyrel Masson B on: 12/24/2019 01:35 PM   Modules accepted: Orders

## 2019-12-25 NOTE — Telephone Encounter (Signed)
Tied calling patient to discuss. No answer. No VM picked up to LM.

## 2019-12-31 ENCOUNTER — Other Ambulatory Visit: Payer: Self-pay

## 2019-12-31 ENCOUNTER — Encounter: Payer: Self-pay | Admitting: Physical Therapy

## 2019-12-31 ENCOUNTER — Ambulatory Visit (INDEPENDENT_AMBULATORY_CARE_PROVIDER_SITE_OTHER): Payer: Self-pay | Admitting: Physical Therapy

## 2019-12-31 DIAGNOSIS — M25562 Pain in left knee: Secondary | ICD-10-CM

## 2019-12-31 DIAGNOSIS — R600 Localized edema: Secondary | ICD-10-CM

## 2019-12-31 DIAGNOSIS — M25661 Stiffness of right knee, not elsewhere classified: Secondary | ICD-10-CM

## 2019-12-31 DIAGNOSIS — R262 Difficulty in walking, not elsewhere classified: Secondary | ICD-10-CM

## 2019-12-31 DIAGNOSIS — G8929 Other chronic pain: Secondary | ICD-10-CM

## 2019-12-31 DIAGNOSIS — M6281 Muscle weakness (generalized): Secondary | ICD-10-CM

## 2019-12-31 DIAGNOSIS — M25561 Pain in right knee: Secondary | ICD-10-CM

## 2019-12-31 NOTE — Therapy (Signed)
[facility] Physical Therapy [address] [place], [place], [address] Phone: [phone]   Fax:  [phone]  Physical Therapy Treatment  Patient Details  Name: [name] [name] MRN: [id number] Date of Birth: 08-01-1950 Referring Provider (PT): Dr. August Saucer   Encounter Date: 12/31/2019   PT End of Session - 12/31/19 1416    Visit Number 2    Number of Visits 12    Date for PT Re-Evaluation 03/03/20    Progress Note Due on Visit 10    PT Start Time 1300    PT Stop Time 1400    PT Time Calculation (min) 60 min    Activity Tolerance Patient tolerated treatment well    Behavior During Therapy [facility] for tasks assessed/performed           Past Medical History:  Diagnosis Date   Acute kidney injury (HCC) 03/02/2019   Acute respiratory failure with hypoxia (HCC) 02/22/2019   Atrial fibrillation with RVR (HCC) 02/26/2019   COVID-19 02/22/2019   Flu-like symptoms 02/22/2019   Hypertension    Knee osteoarthritis 10/24/2017    Past Surgical History:  Procedure Laterality Date   NO PAST SURGERIES     TOTAL KNEE ARTHROPLASTY Right 12/03/2019   Procedure: RIGHT TOTAL KNEE ARTHROPLASTY;  Surgeon: Cammy Copa, MD;  Location: [facility] OR;  Service: Orthopedics;  Laterality: Right;    There were no vitals filed for this visit.   Subjective Assessment - 12/31/19 1305    Subjective pain is worse at night, otherwise doing okay    Patient is accompained by: Interpreter    Limitations Sitting;Standing;Walking    Currently in Pain? Yes    Pain Score 6     Pain Location Knee    Pain Orientation Right    Pain Descriptors / Indicators Aching;Tightness;Constant    Pain Type Surgical pain    Pain Onset 1 to 4 weeks ago    Pain Frequency Constant    Aggravating Factors  worse at night    Pain Relieving Factors medication              [facility] PT Assessment - 12/31/19 1333      Assessment   Medical Diagnosis Rt TKA 12/03/2019    Referring Provider (PT)  Dr. August Saucer    Onset Date/Surgical Date 12/03/19      PROM   Right Knee Flexion 86                         [facility] Adult PT Treatment/Exercise - 12/31/19 1307      Knee/Hip Exercises: Stretches   Knee: Self-Stretch Limitations RLE: 10x10 sec; using LLE for overpressure      Knee/Hip Exercises: Aerobic   Recumbent Bike Seat 6 x 8 min; partial revolutions      Knee/Hip Exercises: Standing   Forward Step Up Right;1 set;10 reps;Hand Hold: 2;Step Height: 4"   heavy reliance on UEs     Knee/Hip Exercises: Seated   Long Arc Quad Right;3 sets;10 reps      Knee/Hip Exercises: Supine   Short Arc Quad Sets Right;20 reps    Heel Slides AAROM;10 reps;Right    Straight Leg Raises Right;10 reps    Straight Leg Raises Limitations after SAQ, unable to perform before      Modalities   Modalities Vasopneumatic      Vasopneumatic   Number Minutes Vasopneumatic  10 minutes    Vasopnuematic Location  Knee    Vasopneumatic Pressure Medium  Vasopneumatic Temperature  34      Manual Therapy   Manual therapy comments Rt knee flexion in sitting with LLE LAQ 4x10                       PT Long Term Goals - 12/24/19 1249      PT LONG TERM GOAL #1   Title Patient will demonstrate/report pain at worst less than or equal to 2/10 to facilitate minimal limitation in daily activity secondary to pain symptoms.    Time 10    Period Weeks    Status New    Target Date 03/03/20      PT LONG TERM GOAL #2   Title Patient will demonstrate independent use of home exercise program to facilitate ability to maintain/progress functional gains from skilled physical therapy services.    Time 10    Period Weeks    Status New    Target Date 03/03/20      PT LONG TERM GOAL #3   Title Patient will demonstrate independent ambulation community distances > 300 ft to facilitate community integration at [facility].    Time 10    Period Weeks    Status New    Target Date 03/03/20      PT LONG  TERM GOAL #4   Title Pt. will demonstrate Rt knee AORM 0-110 deg to facilitate ability to perform transfers, walking, stairs at PLOF s limitation.    Time 10    Period Weeks    Status New    Target Date 03/03/20      PT LONG TERM GOAL #5   Title Pt. will demonstrate Rt knee MMT 5/5 throughout to facilitate independent ambulation, stair navigation, transfers at PLOF.    Time 10    Period Weeks    Status New    Target Date 03/03/20      Additional Long Term Goals   Additional Long Term Goals Yes      PT LONG TERM GOAL #6   Title Pt. will demonstrate FOTO 59% outcome.    Time 10    Period Weeks    Status New    Target Date 03/03/20                 Plan - 12/31/19 1417    Clinical Impression Statement Pt tolerated session well today demonstrating good improvement in Rt knee flexion.  Continues to have difficulty with quad activation and needs to work on functional strength and mobility.  Will continue to benefit from PT to maximize function.    Personal Factors and Comorbidities Comorbidity 1    Comorbidities HTN    Examination-Activity Limitations Sit;Sleep;Squat;Stairs;Stand;Carry;Bend;Transfers;Dressing;Hygiene/Grooming;Lift;Locomotion Level;Reach Overhead    Examination-Participation Restrictions Laundry;Other   home activity   Stability/Clinical Decision Making Stable/Uncomplicated    Rehab Potential Good    PT Frequency 2x / week    PT Duration --   10 weeks   PT Treatment/Interventions ADLs/Self Care Home Management;Cryotherapy;Electrical Stimulation;Iontophoresis 4mg /ml Dexamethasone;Moist Heat;Balance training;Therapeutic exercise;Therapeutic activities;Functional mobility training;Stair training;Gait training;Ultrasound;Neuromuscular re-education;Patient/family education;Manual techniques;Joint Manipulations;Spinal Manipulations;Passive range of motion;Dry needling;Vasopneumatic Device;Taping    PT Next Visit Plan Manual for mobility, strengthening/balance, vaso for  swelling    PT Home Exercise Plan [email or web address]    Consulted and Agree with Plan of Care Patient           Patient will benefit from skilled therapeutic intervention in order to improve the following deficits and impairments:  Abnormal gait, Decreased endurance, Hypomobility,  Decreased activity tolerance, Decreased strength, Pain, Increased fascial restricitons, Increased muscle spasms, Difficulty walking, Decreased mobility, Decreased balance, Decreased range of motion, Improper body mechanics, Impaired flexibility, Decreased coordination, Impaired perceived functional ability  Visit Diagnosis: Chronic pain of right knee  Muscle weakness (generalized)  Stiffness of right knee, not elsewhere classified  Difficulty in walking, not elsewhere classified  Localized edema  Chronic pain of left knee     Problem List Patient Active Problem List   Diagnosis Date Noted   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 Physical Therapy [address] [place], [place], [address] Phone: [phone]   Fax:  [phone]  Name: [name] [name] MRN: [id number] Date of Birth: 06-09-1950

## 2020-01-01 ENCOUNTER — Encounter: Payer: Self-pay | Admitting: Pediatric Intensive Care

## 2020-01-01 ENCOUNTER — Ambulatory Visit (INDEPENDENT_AMBULATORY_CARE_PROVIDER_SITE_OTHER): Payer: Self-pay | Admitting: Rehabilitative and Restorative Service Providers"

## 2020-01-01 ENCOUNTER — Encounter: Payer: Self-pay | Admitting: Rehabilitative and Restorative Service Providers"

## 2020-01-01 ENCOUNTER — Other Ambulatory Visit: Payer: Self-pay | Admitting: Family Medicine

## 2020-01-01 DIAGNOSIS — R600 Localized edema: Secondary | ICD-10-CM

## 2020-01-01 DIAGNOSIS — G8929 Other chronic pain: Secondary | ICD-10-CM

## 2020-01-01 DIAGNOSIS — M25661 Stiffness of right knee, not elsewhere classified: Secondary | ICD-10-CM

## 2020-01-01 DIAGNOSIS — M6281 Muscle weakness (generalized): Secondary | ICD-10-CM

## 2020-01-01 DIAGNOSIS — E7800 Pure hypercholesterolemia, unspecified: Secondary | ICD-10-CM

## 2020-01-01 DIAGNOSIS — R262 Difficulty in walking, not elsewhere classified: Secondary | ICD-10-CM

## 2020-01-01 DIAGNOSIS — M25561 Pain in right knee: Secondary | ICD-10-CM

## 2020-01-01 MED ORDER — ROSUVASTATIN CALCIUM 10 MG PO TABS: 10.0000 mg | ORAL_TABLET | Freq: Every day | ORAL | 1 refills | Status: DC

## 2020-01-01 NOTE — Therapy (Signed)
[facility] Physical Therapy [address] [place], [place], [address] Phone: [phone]   Fax:  [phone]  Physical Therapy Treatment  Patient Details  Name: [name] [name] MRN: [id number] Date of Birth: Feb 22, 1950 Referring Provider (PT): Dr. August Saucer   Encounter Date: 01/01/2020   PT End of Session - 01/01/20 0949    Visit Number 3    Number of Visits 12    Date for PT Re-Evaluation 03/03/20    Progress Note Due on Visit 10    PT Start Time 0930    PT Stop Time 1020    PT Time Calculation (min) 50 min    Activity Tolerance Patient tolerated treatment well    Behavior During Therapy [facility] for tasks assessed/performed           Past Medical History:  Diagnosis Date  . Acute kidney injury (HCC) 03/02/2019  . Acute respiratory failure with hypoxia (HCC) 02/22/2019  . Atrial fibrillation with RVR (HCC) 02/26/2019  . COVID-19 02/22/2019  . Flu-like symptoms 02/22/2019  . Hypertension   . Knee osteoarthritis 10/24/2017    Past Surgical History:  Procedure Laterality Date  . NO PAST SURGERIES    . TOTAL KNEE ARTHROPLASTY Right 12/03/2019   Procedure: RIGHT TOTAL KNEE ARTHROPLASTY;  Surgeon: Cammy Copa, MD;  Location: [facility] OR;  Service: Orthopedics;  Laterality: Right;    There were no vitals filed for this visit.   Subjective Assessment - 01/01/20 0948    Subjective Pt. indicated feeling about 50/50 today, some improvement noted in pain but still hurts.    Patient is accompained by: Interpreter    Limitations Sitting;Standing;Walking    Currently in Pain? Yes    Pain Score --   mild reported   Pain Location Knee    Pain Orientation Right    Pain Descriptors / Indicators Aching;Tightness;Constant    Pain Type Surgical pain    Pain Onset 1 to 4 weeks ago    Pain Frequency Constant                             [facility] Adult PT Treatment/Exercise - 01/01/20 0001      Neuro Re-ed    Neuro Re-ed Details  retro step 20 x  Rt LE posterior, anterior/posterior ankle strategy weight shift 20x       Knee/Hip Exercises: Stretches   Other Knee/Hip Stretches TKE stretch supine c vaso 10 mins      Knee/Hip Exercises: Seated   Long Arc Quad Right;3 sets;10 reps   5 lbs   Long Arc Quad Weight 5 lbs.    Hamstring Curl 3 sets;10 reps;Right   blue band     Vasopneumatic   Number Minutes Vasopneumatic  10 minutes    Vasopnuematic Location  Knee    Vasopneumatic Pressure Medium    Vasopneumatic Temperature  34      Manual Therapy   Manual therapy comments Rt knee flexion, ir, distraction MWM c contralteral knee movement opposite                       PT Long Term Goals - 12/24/19 1249      PT LONG TERM GOAL #1   Title Patient will demonstrate/report pain at worst less than or equal to 2/10 to facilitate minimal limitation in daily activity secondary to pain symptoms.    Time 10    Period Weeks    Status New  Target Date 03/03/20      PT LONG TERM GOAL #2   Title Patient will demonstrate independent use of home exercise program to facilitate ability to maintain/progress functional gains from skilled physical therapy services.    Time 10    Period Weeks    Status New    Target Date 03/03/20      PT LONG TERM GOAL #3   Title Patient will demonstrate independent ambulation community distances > 300 ft to facilitate community integration at [facility].    Time 10    Period Weeks    Status New    Target Date 03/03/20      PT LONG TERM GOAL #4   Title Pt. will demonstrate Rt knee AORM 0-110 deg to facilitate ability to perform transfers, walking, stairs at PLOF s limitation.    Time 10    Period Weeks    Status New    Target Date 03/03/20      PT LONG TERM GOAL #5   Title Pt. will demonstrate Rt knee MMT 5/5 throughout to facilitate independent ambulation, stair navigation, transfers at PLOF.    Time 10    Period Weeks    Status New    Target Date 03/03/20      Additional Long Term Goals    Additional Long Term Goals Yes      PT LONG TERM GOAL #6   Title Pt. will demonstrate FOTO 59% outcome.    Time 10    Period Weeks    Status New    Target Date 03/03/20                 Plan - 01/01/20 1012    Clinical Impression Statement Pt. had difficulty c placing increased weight on Rt LE in standing activity today.  Pt. did demonstrate improvement in TKE activation in LAQ but still limited less than full 0 degrees.  Continued strengthening and mobility gains as well as balance and WB improvement indicated.    Personal Factors and Comorbidities Comorbidity 1    Comorbidities HTN    Examination-Activity Limitations Sit;Sleep;Squat;Stairs;Stand;Carry;Bend;Transfers;Dressing;Hygiene/Grooming;Lift;Locomotion Level;Reach Overhead    Examination-Participation Restrictions Laundry;Other   home activity   Stability/Clinical Decision Making Stable/Uncomplicated    Rehab Potential Good    PT Frequency 2x / week    PT Duration --   10 weeks   PT Treatment/Interventions ADLs/Self Care Home Management;Cryotherapy;Electrical Stimulation;Iontophoresis 4mg /ml Dexamethasone;Moist Heat;Balance training;Therapeutic exercise;Therapeutic activities;Functional mobility training;Stair training;Gait training;Ultrasound;Neuromuscular re-education;Patient/family education;Manual techniques;Joint Manipulations;Spinal Manipulations;Passive range of motion;Dry needling;Vasopneumatic Device;Taping    PT Next Visit Plan Manual for mobility, strengthening/balance, vaso for swelling    PT Home Exercise Plan [id number]    Consulted and Agree with Plan of Care Patient           Patient will benefit from skilled therapeutic intervention in order to improve the following deficits and impairments:  Abnormal gait, Decreased endurance, Hypomobility, Decreased activity tolerance, Decreased strength, Pain, Increased fascial restricitons, Increased muscle spasms, Difficulty walking, Decreased mobility, Decreased  balance, Decreased range of motion, Improper body mechanics, Impaired flexibility, Decreased coordination, Impaired perceived functional ability  Visit Diagnosis: Chronic pain of right knee  Muscle weakness (generalized)  Stiffness of right knee, not elsewhere classified  Difficulty in walking, not elsewhere classified  Localized edema     Problem List Patient Active Problem List   Diagnosis Date Noted  . 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 S/P knee surgery 12/03/2019  . 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  Acute kidney injury (HCC) 03/02/2019  . Atrial fibrillation with RVR (HCC) 02/26/2019  . Acute respiratory failure with hypoxia (HCC) 02/22/2019  . COVID-19 02/22/2019  . Essential hypertension, benign 05/03/2018  . Hypertension 10/24/2017  . Knee osteoarthritis 10/24/2017    Chyrel Masson, PT, DPT, OCS, ATC 01/01/20  10:14 AM    [facility] Physical Therapy [address] [place], [place], [address] Phone: [phone]   Fax:  [phone]  Name: [name] [name] MRN: [id number] Date of Birth: [birth year]

## 2020-01-02 ENCOUNTER — Inpatient Hospital Stay: Payer: No Typology Code available for payment source | Admitting: Family Medicine

## 2020-01-07 ENCOUNTER — Encounter: Payer: Self-pay | Admitting: Physical Therapy

## 2020-01-07 ENCOUNTER — Other Ambulatory Visit: Payer: Self-pay

## 2020-01-07 ENCOUNTER — Ambulatory Visit (INDEPENDENT_AMBULATORY_CARE_PROVIDER_SITE_OTHER): Payer: Self-pay | Admitting: Physical Therapy

## 2020-01-07 DIAGNOSIS — M6281 Muscle weakness (generalized): Secondary | ICD-10-CM

## 2020-01-07 DIAGNOSIS — R600 Localized edema: Secondary | ICD-10-CM

## 2020-01-07 DIAGNOSIS — M25661 Stiffness of right knee, not elsewhere classified: Secondary | ICD-10-CM

## 2020-01-07 DIAGNOSIS — R262 Difficulty in walking, not elsewhere classified: Secondary | ICD-10-CM

## 2020-01-07 DIAGNOSIS — M25562 Pain in left knee: Secondary | ICD-10-CM

## 2020-01-07 DIAGNOSIS — M25561 Pain in right knee: Secondary | ICD-10-CM

## 2020-01-07 DIAGNOSIS — G8929 Other chronic pain: Secondary | ICD-10-CM

## 2020-01-07 NOTE — Therapy (Signed)
[facility] Physical Therapy [address] [place], [place], [address] Phone: [phone]   Fax:  [phone]  Physical Therapy Treatment  Patient Details  Name: [name] [name] MRN: [id number] Date of Birth: February 21, 1950 Referring Provider (PT): Dr. August Saucer   Encounter Date: 01/07/2020   PT End of Session - 01/07/20 1346    Visit Number 4    Number of Visits 12    Date for PT Re-Evaluation 03/03/20    Progress Note Due on Visit 10    PT Start Time 1300    PT Stop Time 1355    PT Time Calculation (min) 55 min    Activity Tolerance Patient tolerated treatment well    Behavior During Therapy [facility] for tasks assessed/performed           Past Medical History:  Diagnosis Date  . Acute kidney injury (HCC) 03/02/2019  . Acute respiratory failure with hypoxia (HCC) 02/22/2019  . Atrial fibrillation with RVR (HCC) 02/26/2019  . COVID-19 02/22/2019  . Flu-like symptoms 02/22/2019  . Hypertension   . Knee osteoarthritis 10/24/2017    Past Surgical History:  Procedure Laterality Date  . NO PAST SURGERIES    . TOTAL KNEE ARTHROPLASTY Right 12/03/2019   Procedure: RIGHT TOTAL KNEE ARTHROPLASTY;  Surgeon: Cammy Copa, MD;  Location: [facility] OR;  Service: Orthopedics;  Laterality: Right;    There were no vitals filed for this visit.   Subjective Assessment - 01/07/20 1259    Subjective overall doing well, still having numbness on the lateral incision    Patient is accompained by: Interpreter    Limitations Sitting;Standing;Walking    Patient Stated Goals improve mobility    Currently in Pain? Yes    Pain Score --   "it's not bad"   Pain Location Knee    Pain Orientation Right    Pain Descriptors / Indicators Aching;Tightness;Constant    Pain Type Surgical pain;Acute pain    Pain Onset 1 to 4 weeks ago    Pain Frequency Constant    Aggravating Factors  worse at night    Pain Relieving Factors medication                              [facility] Adult PT Treatment/Exercise - 01/07/20 1301      Knee/Hip Exercises: Stretches   Knee: Self-Stretch Limitations RLE: 10x10 sec; using LLE for overpressure    Other Knee/Hip Stretches TKE stretch supine c vaso 3 mins - unable to tolerate longer    Other Knee/Hip Stretches seated tailgate flexion x 2 min      Knee/Hip Exercises: Aerobic   Recumbent Bike Seat 6 x 8 min; partial revolutions      Knee/Hip Exercises: Standing   Knee Flexion Both;10 reps    Knee Flexion Limitations limited range on Rt    Hip Abduction Both;10 reps;Knee straight    Hip Extension Both;10 reps;Knee straight      Knee/Hip Exercises: Seated   Long Arc Quad Right;3 sets;10 reps    Long Arc Quad Weight 5 lbs.    Hamstring Curl 3 sets;10 reps;Right   blue band     Vasopneumatic   Number Minutes Vasopneumatic  10 minutes    Vasopnuematic Location  Knee    Vasopneumatic Pressure Medium    Vasopneumatic Temperature  34      Manual Therapy   Manual therapy comments Rt knee flexion, ir, distraction MWM c contralteral knee movement opposite  PT Long Term Goals - 01/07/20 1347      PT LONG TERM GOAL #1   Title Patient will demonstrate/report pain at worst less than or equal to 2/10 to facilitate minimal limitation in daily activity secondary to pain symptoms.    Time 10    Period Weeks    Status On-going    Target Date 03/03/20      PT LONG TERM GOAL #2   Title Patient will demonstrate independent use of home exercise program to facilitate ability to maintain/progress functional gains from skilled physical therapy services.    Time 10    Period Weeks    Status On-going      PT LONG TERM GOAL #3   Title Patient will demonstrate independent ambulation community distances > 300 ft to facilitate community integration at [facility].    Time 10    Period Weeks    Status On-going      PT LONG TERM GOAL #4   Title Pt. will demonstrate Rt knee AORM 0-110 deg to facilitate  ability to perform transfers, walking, stairs at PLOF s limitation.    Time 10    Period Weeks    Status On-going      PT LONG TERM GOAL #5   Title Pt. will demonstrate Rt knee MMT 5/5 throughout to facilitate independent ambulation, stair navigation, transfers at PLOF.    Time 10    Period Weeks    Status On-going      PT LONG TERM GOAL #6   Title Pt. will demonstrate FOTO 59% outcome.    Time 10    Period Weeks    Status On-going                 Plan - 01/07/20 1347    Clinical Impression Statement Pt tolerated session well today, working on ROM and strengthening as well as weightbearing in RLE.  Will continue to benefit from PT to maximize function.    Personal Factors and Comorbidities Comorbidity 1    Comorbidities HTN    Examination-Activity Limitations Sit;Sleep;Squat;Stairs;Stand;Carry;Bend;Transfers;Dressing;Hygiene/Grooming;Lift;Locomotion Level;Reach Overhead    Examination-Participation Restrictions Laundry;Other   home activity   Stability/Clinical Decision Making Stable/Uncomplicated    Rehab Potential Good    PT Frequency 2x / week    PT Duration --   10 weeks   PT Treatment/Interventions ADLs/Self Care Home Management;Cryotherapy;Electrical Stimulation;Iontophoresis 4mg /ml Dexamethasone;Moist Heat;Balance training;Therapeutic exercise;Therapeutic activities;Functional mobility training;Stair training;Gait training;Ultrasound;Neuromuscular re-education;Patient/family education;Manual techniques;Joint Manipulations;Spinal Manipulations;Passive range of motion;Dry needling;Vasopneumatic Device;Taping    PT Next Visit Plan Manual for mobility, strengthening/balance, vaso for swelling, standing exercises    PT Home Exercise Plan [id number]    Consulted and Agree with Plan of Care Patient           Patient will benefit from skilled therapeutic intervention in order to improve the following deficits and impairments:  Abnormal gait, Decreased endurance,  Hypomobility, Decreased activity tolerance, Decreased strength, Pain, Increased fascial restricitons, Increased muscle spasms, Difficulty walking, Decreased mobility, Decreased balance, Decreased range of motion, Improper body mechanics, Impaired flexibility, Decreased coordination, Impaired perceived functional ability  Visit Diagnosis: Chronic pain of right knee  Muscle weakness (generalized)  Stiffness of right knee, not elsewhere classified  Difficulty in walking, not elsewhere classified  Localized edema  Chronic pain of left knee     Problem List Patient Active Problem List   Diagnosis Date Noted  . 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 S/P knee surgery 12/03/2019  . Type 2 diabetes mellitus with  obesity (HCC) 07/22/2019  . Acute kidney injury (HCC) 03/02/2019  . Atrial fibrillation with RVR (HCC) 02/26/2019  . Acute respiratory failure with hypoxia (HCC) 02/22/2019  . COVID-19 02/22/2019  . Essential hypertension, benign 05/03/2018  . Hypertension 10/24/2017  . Knee osteoarthritis 10/24/2017      Clarita Crane, PT, DPT 01/07/20 1:49 PM    [facility] Physical Therapy [address] [place], [place], [address] Phone: [phone]   Fax:  [phone]  Name: [name] [name] MRN: [id number] Date of Birth: [birth year]

## 2020-01-07 NOTE — Congregational Nurse Program (Signed)
  Dept: [phone]   Congregational Nurse Program Note  Date of Encounter: 01/01/2020  Past Medical History: Past Medical History:  Diagnosis Date  . Acute kidney injury (HCC) 03/02/2019  . Acute respiratory failure with hypoxia (HCC) 02/22/2019  . Atrial fibrillation with RVR (HCC) 02/26/2019  . COVID-19 02/22/2019  . Flu-like symptoms 02/22/2019  . Hypertension   . Knee osteoarthritis 10/24/2017    Encounter Details: Encounter to check medication and discuss transportation for PT visits.  Shann Medal RN BSN CNP [phone]

## 2020-01-08 NOTE — Telephone Encounter (Signed)
Tried calling. No answer no VM to LM

## 2020-01-09 ENCOUNTER — Encounter: Payer: Self-pay | Admitting: Physical Therapy

## 2020-01-09 ENCOUNTER — Ambulatory Visit (INDEPENDENT_AMBULATORY_CARE_PROVIDER_SITE_OTHER): Payer: Self-pay | Admitting: Physical Therapy

## 2020-01-09 ENCOUNTER — Other Ambulatory Visit: Payer: Self-pay

## 2020-01-09 DIAGNOSIS — M25562 Pain in left knee: Secondary | ICD-10-CM

## 2020-01-09 DIAGNOSIS — M25561 Pain in right knee: Secondary | ICD-10-CM

## 2020-01-09 DIAGNOSIS — M25661 Stiffness of right knee, not elsewhere classified: Secondary | ICD-10-CM

## 2020-01-09 DIAGNOSIS — R262 Difficulty in walking, not elsewhere classified: Secondary | ICD-10-CM

## 2020-01-09 DIAGNOSIS — G8929 Other chronic pain: Secondary | ICD-10-CM

## 2020-01-09 DIAGNOSIS — M6281 Muscle weakness (generalized): Secondary | ICD-10-CM

## 2020-01-09 DIAGNOSIS — R600 Localized edema: Secondary | ICD-10-CM

## 2020-01-09 NOTE — Therapy (Signed)
[facility] Physical Therapy [address] [place], [place], [address] Phone: [phone]   Fax:  [phone]  Physical Therapy Treatment  Patient Details  Name: [name] [name] MRN: [id number] Date of Birth: 1950/08/05 Referring Provider (PT): Dr. August Saucer   Encounter Date: 01/09/2020   PT End of Session - 01/09/20 1242    Visit Number 5    Number of Visits 12    Date for PT Re-Evaluation 03/03/20    Progress Note Due on Visit 10    PT Start Time 1100    PT Stop Time 1150    PT Time Calculation (min) 50 min    Activity Tolerance Patient tolerated treatment well    Behavior During Therapy [facility] for tasks assessed/performed           Past Medical History:  Diagnosis Date  . Acute kidney injury (HCC) 03/02/2019  . Acute respiratory failure with hypoxia (HCC) 02/22/2019  . Atrial fibrillation with RVR (HCC) 02/26/2019  . COVID-19 02/22/2019  . Flu-like symptoms 02/22/2019  . Hypertension   . Knee osteoarthritis 10/24/2017    Past Surgical History:  Procedure Laterality Date  . NO PAST SURGERIES    . TOTAL KNEE ARTHROPLASTY Right 12/03/2019   Procedure: RIGHT TOTAL KNEE ARTHROPLASTY;  Surgeon: Cammy Copa, MD;  Location: [facility] OR;  Service: Orthopedics;  Laterality: Right;    There were no vitals filed for this visit.   Subjective Assessment - 01/09/20 1104    Subjective knee is doing well; feels pain with touching knee    Patient is accompained by: Interpreter    Limitations Sitting;Standing;Walking    Patient Stated Goals improve mobility    Currently in Pain? Yes    Pain Score --   below 5 now   Pain Location Knee    Pain Orientation Right    Pain Descriptors / Indicators Aching;Tightness;Constant    Pain Type Acute pain;Surgical pain    Pain Onset 1 to 4 weeks ago              [facility] PT Assessment - 01/09/20 1132      Assessment   Medical Diagnosis Rt TKA 12/03/2019    Referring Provider (PT) Dr. August Saucer    Onset Date/Surgical Date  12/03/19      AROM   Right Knee Extension 0    Right Knee Flexion 80                         [facility] Adult PT Treatment/Exercise - 01/09/20 1106      Ambulation/Gait   Ambulation/Gait Yes    Ambulation/Gait Assistance 4: Min guard    Ambulation Distance (Feet) 50 Feet    Assistive device L Forearm Crutch    Gait Pattern Step-through pattern    Ambulation Surface Level;Indoor    Gait Comments cues for sequencing      Knee/Hip Exercises: Aerobic   Nustep Seat 10 x 8 min; L 6      Knee/Hip Exercises: Seated   Long Arc Quad Right;3 sets;10 reps    Long Arc Quad Weight 5 lbs.    Hamstring Curl 3 sets;10 reps;Right   blue band     Knee/Hip Exercises: Supine   Heel Slides AAROM;Right;20 reps    Heel Slides Limitations x 5 reps with red ball and PT overpressure                       PT Long Term  Goals - 01/07/20 1347      PT LONG TERM GOAL #1   Title Patient will demonstrate/report pain at worst less than or equal to 2/10 to facilitate minimal limitation in daily activity secondary to pain symptoms.    Time 10    Period Weeks    Status On-going    Target Date 03/03/20      PT LONG TERM GOAL #2   Title Patient will demonstrate independent use of home exercise program to facilitate ability to maintain/progress functional gains from skilled physical therapy services.    Time 10    Period Weeks    Status On-going      PT LONG TERM GOAL #3   Title Patient will demonstrate independent ambulation community distances > 300 ft to facilitate community integration at [facility].    Time 10    Period Weeks    Status On-going      PT LONG TERM GOAL #4   Title Pt. will demonstrate Rt knee AORM 0-110 deg to facilitate ability to perform transfers, walking, stairs at PLOF s limitation.    Time 10    Period Weeks    Status On-going      PT LONG TERM GOAL #5   Title Pt. will demonstrate Rt knee MMT 5/5 throughout to facilitate independent ambulation, stair  navigation, transfers at PLOF.    Time 10    Period Weeks    Status On-going      PT LONG TERM GOAL #6   Title Pt. will demonstrate FOTO 59% outcome.    Time 10    Period Weeks    Status On-going                 Plan - 01/09/20 1242    Clinical Impression Statement Pt demonstrating improved knee flexion and overall progressing well with PT.  Initiated amb with forearm crutch as this is what she has at home.  Currently supervision and anticipate she can progress to this in next 1-2 weeks.    Personal Factors and Comorbidities Comorbidity 1    Comorbidities HTN    Examination-Activity Limitations Sit;Sleep;Squat;Stairs;Stand;Carry;Bend;Transfers;Dressing;Hygiene/Grooming;Lift;Locomotion Level;Reach Overhead    Examination-Participation Restrictions Laundry;Other   home activity   Stability/Clinical Decision Making Stable/Uncomplicated    Rehab Potential Good    PT Frequency 2x / week    PT Duration --   10 weeks   PT Treatment/Interventions ADLs/Self Care Home Management;Cryotherapy;Electrical Stimulation;Iontophoresis 4mg /ml Dexamethasone;Moist Heat;Balance training;Therapeutic exercise;Therapeutic activities;Functional mobility training;Stair training;Gait training;Ultrasound;Neuromuscular re-education;Patient/family education;Manual techniques;Joint Manipulations;Spinal Manipulations;Passive range of motion;Dry needling;Vasopneumatic Device;Taping    PT Next Visit Plan Manual for mobility, strengthening/balance, vaso for swelling, standing exercises; gait training with forearm crutch    PT Home Exercise Plan [id number]    Consulted and Agree with Plan of Care Patient           Patient will benefit from skilled therapeutic intervention in order to improve the following deficits and impairments:  Abnormal gait,Decreased endurance,Hypomobility,Decreased activity tolerance,Decreased strength,Pain,Increased fascial restricitons,Increased muscle spasms,Difficulty walking,Decreased  mobility,Decreased balance,Decreased range of motion,Improper body mechanics,Impaired flexibility,Decreased coordination,Impaired perceived functional ability  Visit Diagnosis: Chronic pain of right knee  Muscle weakness (generalized)  Stiffness of right knee, not elsewhere classified  Difficulty in walking, not elsewhere classified  Localized edema  Chronic pain of left knee     Problem List Patient Active Problem List   Diagnosis Date Noted  . 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 S/P knee surgery 12/03/2019  . Type 2 diabetes mellitus with  obesity (HCC) 07/22/2019  . Acute kidney injury (HCC) 03/02/2019  . Atrial fibrillation with RVR (HCC) 02/26/2019  . Acute respiratory failure with hypoxia (HCC) 02/22/2019  . COVID-19 02/22/2019  . Essential hypertension, benign 05/03/2018  . Hypertension 10/24/2017  . Knee osteoarthritis 10/24/2017      Clarita Crane, PT, DPT 01/09/20 12:46 PM     [facility] Physical Therapy [address] [place], [place], [address] Phone: [phone]   Fax:  [phone]  Name: [name] [name] MRN: [id number] Date of Birth: Jan 04, 1951

## 2020-01-13 ENCOUNTER — Ambulatory Visit: Payer: No Typology Code available for payment source | Admitting: Family Medicine

## 2020-01-14 ENCOUNTER — Other Ambulatory Visit: Payer: Self-pay

## 2020-01-14 ENCOUNTER — Ambulatory Visit (INDEPENDENT_AMBULATORY_CARE_PROVIDER_SITE_OTHER): Payer: Self-pay | Admitting: Rehabilitative and Restorative Service Providers"

## 2020-01-14 ENCOUNTER — Encounter: Payer: Self-pay | Admitting: Rehabilitative and Restorative Service Providers"

## 2020-01-14 DIAGNOSIS — M6281 Muscle weakness (generalized): Secondary | ICD-10-CM

## 2020-01-14 DIAGNOSIS — M25661 Stiffness of right knee, not elsewhere classified: Secondary | ICD-10-CM

## 2020-01-14 DIAGNOSIS — M25561 Pain in right knee: Secondary | ICD-10-CM

## 2020-01-14 DIAGNOSIS — R262 Difficulty in walking, not elsewhere classified: Secondary | ICD-10-CM

## 2020-01-14 DIAGNOSIS — R600 Localized edema: Secondary | ICD-10-CM

## 2020-01-14 DIAGNOSIS — G8929 Other chronic pain: Secondary | ICD-10-CM

## 2020-01-14 NOTE — Therapy (Addendum)
[facility] Physical Therapy [address] [place], [place], [address] Phone: [phone]   Fax:  [phone]  Physical Therapy Treatment  Patient Details  Name: [name] [name] MRN: [id number] Date of Birth: 1950-04-20 Referring Provider (PT): Dr. August Saucer   Encounter Date: 01/14/2020   PT End of Session - 01/14/20 1432    Visit Number 6    Number of Visits 12    Date for PT Re-Evaluation 03/03/20    Progress Note Due on Visit 10    PT Start Time 1430    PT Stop Time 1512    PT Time Calculation (min) 42 min    Activity Tolerance Patient tolerated treatment well    Behavior During Therapy [facility] for tasks assessed/performed           Past Medical History:  Diagnosis Date  . Acute kidney injury (HCC) 03/02/2019  . Acute respiratory failure with hypoxia (HCC) 02/22/2019  . Atrial fibrillation with RVR (HCC) 02/26/2019  . COVID-19 02/22/2019  . Flu-like symptoms 02/22/2019  . Hypertension   . Knee osteoarthritis 10/24/2017    Past Surgical History:  Procedure Laterality Date  . NO PAST SURGERIES    . TOTAL KNEE ARTHROPLASTY Right 12/03/2019   Procedure: RIGHT TOTAL KNEE ARTHROPLASTY;  Surgeon: Cammy Copa, MD;  Location: [facility] OR;  Service: Orthopedics;  Laterality: Right;    There were no vitals filed for this visit.   Subjective Assessment - 01/14/20 1446    Subjective Pt. indicated having numbness on lateral knee that she was asking about today.  Some ache and stiffness at night.    Patient is accompained by: Interpreter    Limitations Sitting;Standing;Walking    Patient Stated Goals improve mobility    Currently in Pain? Yes    Pain Score --   mild with walking   Pain Onset 1 to 4 weeks ago                             [facility] Adult PT Treatment/Exercise - 01/14/20 0001      Knee/Hip Exercises: Aerobic   Nustep Lvl 6 6 mins      Knee/Hip Exercises: Standing   Forward Step Up Step Height: 4";10 reps;Right       Knee/Hip Exercises: Seated   Long Arc Quad 3 sets;10 reps    Long Arc Quad Weight 6 lbs.    Sit to Sand without UE support;10 reps   20 inch table         Manual Rt knee flexion MWM c flexion/distraction, IR seated             PT Long Term Goals - 01/07/20 1347      PT LONG TERM GOAL #1   Title Patient will demonstrate/report pain at worst less than or equal to 2/10 to facilitate minimal limitation in daily activity secondary to pain symptoms.    Time 10    Period Weeks    Status On-going    Target Date 03/03/20      PT LONG TERM GOAL #2   Title Patient will demonstrate independent use of home exercise program to facilitate ability to maintain/progress functional gains from skilled physical therapy services.    Time 10    Period Weeks    Status On-going      PT LONG TERM GOAL #3   Title Patient will demonstrate independent ambulation community distances > 300 ft to facilitate community integration at  [facility].    Time 10    Period Weeks    Status On-going      PT LONG TERM GOAL #4   Title Pt. will demonstrate Rt knee AORM 0-110 deg to facilitate ability to perform transfers, walking, stairs at PLOF s limitation.    Time 10    Period Weeks    Status On-going      PT LONG TERM GOAL #5   Title Pt. will demonstrate Rt knee MMT 5/5 throughout to facilitate independent ambulation, stair navigation, transfers at PLOF.    Time 10    Period Weeks    Status On-going      PT LONG TERM GOAL #6   Title Pt. will demonstrate FOTO 59% outcome.    Time 10    Period Weeks    Status On-going                 Plan - 01/14/20 1451    Clinical Impression Statement Additional time spent today on education to answer concerns about numbness around incision as well as with how Pt. was doing overall.  Continued strengthening and balance improvement to help overall function towards independent.  Progression on balance intervention limited in part due to WB pressure symptoms  reported by Pt.    Personal Factors and Comorbidities Comorbidity 1    Comorbidities HTN    Examination-Activity Limitations Sit;Sleep;Squat;Stairs;Stand;Carry;Bend;Transfers;Dressing;Hygiene/Grooming;Lift;Locomotion Level;Reach Overhead    Examination-Participation Restrictions Laundry;Other   home activity   Stability/Clinical Decision Making Stable/Uncomplicated    Rehab Potential Good    PT Frequency 2x / week    PT Duration --   10 weeks   PT Treatment/Interventions ADLs/Self Care Home Management;Cryotherapy;Electrical Stimulation;Iontophoresis 4mg /ml Dexamethasone;Moist Heat;Balance training;Therapeutic exercise;Therapeutic activities;Functional mobility training;Stair training;Gait training;Ultrasound;Neuromuscular re-education;Patient/family education;Manual techniques;Joint Manipulations;Spinal Manipulations;Passive range of motion;Dry needling;Vasopneumatic Device;Taping    PT Next Visit Plan Manual for mobility, strengthening/balance, vaso for swelling, standing exercises; gait training with forearm crutch    PT Home Exercise Plan [id number]    Consulted and Agree with Plan of Care Patient           Patient will benefit from skilled therapeutic intervention in order to improve the following deficits and impairments:  Abnormal gait,Decreased endurance,Hypomobility,Decreased activity tolerance,Decreased strength,Pain,Increased fascial restricitons,Increased muscle spasms,Difficulty walking,Decreased mobility,Decreased balance,Decreased range of motion,Improper body mechanics,Impaired flexibility,Decreased coordination,Impaired perceived functional ability  Visit Diagnosis: Chronic pain of right knee  Muscle weakness (generalized)  Stiffness of right knee, not elsewhere classified  Difficulty in walking, not elsewhere classified  Localized edema     Problem List Patient Active Problem List   Diagnosis Date Noted  . 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 S/P knee surgery  12/03/2019  . 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 Acute kidney injury (HCC) 03/02/2019  . Atrial fibrillation with RVR (HCC) 02/26/2019  . Acute respiratory failure with hypoxia (HCC) 02/22/2019  . COVID-19 02/22/2019  . Essential hypertension, benign 05/03/2018  . Hypertension 10/24/2017  . Knee osteoarthritis 10/24/2017    10/26/2017, PT, DPT, OCS, ATC 01/14/20  3:08 PM  01/16/20, PT, DPT, OCS, ATC 01/16/20  3:04 PM     [facility] Physical Therapy [address] [place], [place], [place] Phone: [phone]   Fax:  [phone]  Name: [name] [name] MRN: Avelina Laine Date of Birth: Dec 23, 1950

## 2020-01-16 ENCOUNTER — Telehealth: Payer: Self-pay

## 2020-01-16 ENCOUNTER — Other Ambulatory Visit: Payer: Self-pay

## 2020-01-16 ENCOUNTER — Ambulatory Visit (INDEPENDENT_AMBULATORY_CARE_PROVIDER_SITE_OTHER): Payer: Self-pay | Admitting: Rehabilitative and Restorative Service Providers"

## 2020-01-16 ENCOUNTER — Encounter: Payer: Self-pay | Admitting: Rehabilitative and Restorative Service Providers"

## 2020-01-16 DIAGNOSIS — G8929 Other chronic pain: Secondary | ICD-10-CM

## 2020-01-16 DIAGNOSIS — R262 Difficulty in walking, not elsewhere classified: Secondary | ICD-10-CM

## 2020-01-16 DIAGNOSIS — M79672 Pain in left foot: Secondary | ICD-10-CM

## 2020-01-16 DIAGNOSIS — M25561 Pain in right knee: Secondary | ICD-10-CM

## 2020-01-16 DIAGNOSIS — M25661 Stiffness of right knee, not elsewhere classified: Secondary | ICD-10-CM

## 2020-01-16 DIAGNOSIS — M79671 Pain in right foot: Secondary | ICD-10-CM

## 2020-01-16 DIAGNOSIS — M545 Low back pain, unspecified: Secondary | ICD-10-CM

## 2020-01-16 DIAGNOSIS — R600 Localized edema: Secondary | ICD-10-CM

## 2020-01-16 DIAGNOSIS — M6281 Muscle weakness (generalized): Secondary | ICD-10-CM

## 2020-01-16 NOTE — Telephone Encounter (Signed)
Message received from Shann Medal, RN /Congregational Nurse. She explained that the patient needs a new prescription for gabapentin as she will not have enough to last until her appointment with Dr Alvis Lemmings 02/17/2019.

## 2020-01-16 NOTE — Therapy (Addendum)
[facility] Physical Therapy [address] [place], [place], [address] Phone: [phone]   Fax:  [phone]  Physical Therapy Treatment  Patient Details  Name: [name] [name] MRN: [id number] Date of Birth: [birth year] Referring Provider (PT): Dr. August Saucer   Encounter Date: 01/16/2020   PT End of Session - 01/16/20 1425    Visit Number 7    Number of Visits 12    Date for PT Re-Evaluation 03/03/20    Progress Note Due on Visit 10    PT Start Time 1428    PT Stop Time 1518    PT Time Calculation (min) 50 min    Activity Tolerance Patient tolerated treatment well    Behavior During Therapy [facility] for tasks assessed/performed           Past Medical History:  Diagnosis Date  . Acute kidney injury (HCC) 03/02/2019  . Acute respiratory failure with hypoxia (HCC) 02/22/2019  . Atrial fibrillation with RVR (HCC) 02/26/2019  . COVID-19 02/22/2019  . Flu-like symptoms 02/22/2019  . Hypertension   . Knee osteoarthritis 10/24/2017    Past Surgical History:  Procedure Laterality Date  . NO PAST SURGERIES    . TOTAL KNEE ARTHROPLASTY Right 12/03/2019   Procedure: RIGHT TOTAL KNEE ARTHROPLASTY;  Surgeon: Cammy Copa, MD;  Location: [facility] OR;  Service: Orthopedics;  Laterality: Right;    There were no vitals filed for this visit.   Subjective Assessment - 01/16/20 1433    Subjective Pt. reported she was getting there more and more but still some tightness and pain at time.    Patient is accompained by: Interpreter    Limitations Sitting;Standing;Walking    Patient Stated Goals improve mobility    Currently in Pain? Yes    Pain Score --   reported mild   Pain Onset 1 to 4 weeks ago    Aggravating Factors  standing, worse at night insidious                             [facility] Adult PT Treatment/Exercise - 01/16/20 0001      Ambulation/Gait   Gait Comments SPC in Lt UE 100 ft c CGA/gait belt      Neuro Re-ed    Neuro Re-ed Details   tandem ambulation fwd 10 ft x 3, reverse ambulation 10 ft x 3 in // bars.  tandem stance Rt LE fwd 1 min      Knee/Hip Exercises: Stretches   Gastroc Stretch 3 reps;30 seconds;Both   incline board   Other Knee/Hip Stretches  TKE c vaso in supine 10 mins     Knee/Hip Exercises: Aerobic   Nustep Lvl 6 10 mins      Knee/Hip Exercises: Seated   Long Arc Quad 3 sets;10 reps    Long Arc Quad Weight 6 lbs.    Sit to Sand 20 reps;without UE support   20 inch chair        Vaso compression to Rt knee in supine 10 mins, medium compression, 34 deg                                   PT Long Term Goals - 01/07/20 1347      PT LONG TERM GOAL #1   Title Patient will demonstrate/report pain at worst less than or equal to 2/10 to facilitate minimal limitation in daily  activity secondary to pain symptoms.    Time 10    Period Weeks    Status On-going    Target Date 03/03/20      PT LONG TERM GOAL #2   Title Patient will demonstrate independent use of home exercise program to facilitate ability to maintain/progress functional gains from skilled physical therapy services.    Time 10    Period Weeks    Status On-going      PT LONG TERM GOAL #3   Title Patient will demonstrate independent ambulation community distances > 300 ft to facilitate community integration at [facility].    Time 10    Period Weeks    Status On-going      PT LONG TERM GOAL #4   Title Pt. will demonstrate Rt knee AORM 0-110 deg to facilitate ability to perform transfers, walking, stairs at PLOF s limitation.    Time 10    Period Weeks    Status On-going      PT LONG TERM GOAL #5   Title Pt. will demonstrate Rt knee MMT 5/5 throughout to facilitate independent ambulation, stair navigation, transfers at PLOF.    Time 10    Period Weeks    Status On-going      PT LONG TERM GOAL #6   Title Pt. will demonstrate FOTO 59% outcome.    Time 10    Period Weeks    Status On-going                 Plan -  01/16/20 1436    Clinical Impression Statement Difficulty and symptoms noted c attempts to increase WB on Rt LE and progress balance control for transitioning plan to [facility].  Still limited at this time.  Able to perform SPC c CGA in clinic 100 ft+. Standing time limited in part due to leg pain and also some complaints of back pain (forward trunk lean noted in intervention ).    Personal Factors and Comorbidities Comorbidity 1    Comorbidities HTN    Examination-Activity Limitations Sit;Sleep;Squat;Stairs;Stand;Carry;Bend;Transfers;Dressing;Hygiene/Grooming;Lift;Locomotion Level;Reach Overhead    Examination-Participation Restrictions Laundry;Other   home activity   Stability/Clinical Decision Making Stable/Uncomplicated    Rehab Potential Good    PT Frequency 2x / week    PT Duration --   10 weeks   PT Treatment/Interventions ADLs/Self Care Home Management;Cryotherapy;Electrical Stimulation;Iontophoresis 4mg /ml Dexamethasone;Moist Heat;Balance training;Therapeutic exercise;Therapeutic activities;Functional mobility training;Stair training;Gait training;Ultrasound;Neuromuscular re-education;Patient/family education;Manual techniques;Joint Manipulations;Spinal Manipulations;Passive range of motion;Dry needling;Vasopneumatic Device;Taping    PT Next Visit Plan Manual for mobility, strengthening/balance, vaso for swelling, standing exercises; gait training c SPC/forearm crutch    PT Home Exercise Plan [id number]    Consulted and Agree with Plan of Care Patient           Patient will benefit from skilled therapeutic intervention in order to improve the following deficits and impairments:  Abnormal gait,Decreased endurance,Hypomobility,Decreased activity tolerance,Decreased strength,Pain,Increased fascial restricitons,Increased muscle spasms,Difficulty walking,Decreased mobility,Decreased balance,Decreased range of motion,Improper body mechanics,Impaired flexibility,Decreased coordination,Impaired perceived  functional ability  Visit Diagnosis: Chronic pain of right knee  Muscle weakness (generalized)  Stiffness of right knee, not elsewhere classified  Difficulty in walking, not elsewhere classified  Localized edema     Problem List Patient Active Problem List   Diagnosis Date Noted  . 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 S/P knee surgery 12/03/2019  . 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 Acute kidney injury (HCC) 03/02/2019  . Atrial fibrillation with RVR (HCC) 02/26/2019  .  Acute respiratory failure with hypoxia (HCC) 02/22/2019  . COVID-19 02/22/2019  . Essential hypertension, benign 05/03/2018  . Hypertension 10/24/2017  . Knee osteoarthritis 10/24/2017    Chyrel Masson, PT, DPT, OCS, ATC 01/16/20  3:12 PM   Chyrel Masson, PT, DPT, OCS, ATC 01/23/20  2:45 PM      [place] [facility] Physical Therapy [address] [place], [place], [address] Phone: [phone]   Fax:  [phone]  Name: Cameron Katayama MRN: [id number] Date of Birth: [birth year]

## 2020-01-17 ENCOUNTER — Other Ambulatory Visit: Payer: Self-pay | Admitting: Family Medicine

## 2020-01-17 MED ORDER — GABAPENTIN 100 MG PO CAPS: 100.0000 mg | ORAL_CAPSULE | Freq: Three times a day (TID) | ORAL | 1 refills | Status: DC

## 2020-01-17 NOTE — Telephone Encounter (Signed)
Done

## 2020-01-21 ENCOUNTER — Encounter: Payer: Self-pay | Admitting: Rehabilitative and Restorative Service Providers"

## 2020-01-23 ENCOUNTER — Other Ambulatory Visit: Payer: Self-pay

## 2020-01-23 ENCOUNTER — Ambulatory Visit (INDEPENDENT_AMBULATORY_CARE_PROVIDER_SITE_OTHER): Payer: Self-pay | Admitting: Rehabilitative and Restorative Service Providers"

## 2020-01-23 ENCOUNTER — Encounter: Payer: Self-pay | Admitting: Rehabilitative and Restorative Service Providers"

## 2020-01-23 DIAGNOSIS — R262 Difficulty in walking, not elsewhere classified: Secondary | ICD-10-CM

## 2020-01-23 DIAGNOSIS — M6281 Muscle weakness (generalized): Secondary | ICD-10-CM

## 2020-01-23 DIAGNOSIS — G8929 Other chronic pain: Secondary | ICD-10-CM

## 2020-01-23 DIAGNOSIS — M25661 Stiffness of right knee, not elsewhere classified: Secondary | ICD-10-CM

## 2020-01-23 DIAGNOSIS — R600 Localized edema: Secondary | ICD-10-CM

## 2020-01-23 DIAGNOSIS — M25561 Pain in right knee: Secondary | ICD-10-CM

## 2020-01-23 NOTE — Therapy (Signed)
[facility] Physical Therapy [address] [place], [place], [address] Phone: [phone]   Fax:  [phone]  Physical Therapy Treatment  Patient Details  Name: [name] [name] MRN: [id number] Date of Birth: [birth year] Referring Provider (PT): Dr. August Saucer   Encounter Date: 01/23/2020   PT End of Session - 01/23/20 1418    Visit Number 8    Number of Visits 12    Date for PT Re-Evaluation 03/03/20    Progress Note Due on Visit 10    PT Start Time 1428    PT Stop Time 1509    PT Time Calculation (min) 41 min    Activity Tolerance Patient tolerated treatment well    Behavior During Therapy [facility] for tasks assessed/performed           Past Medical History:  Diagnosis Date  . Acute kidney injury (HCC) 03/02/2019  . Acute respiratory failure with hypoxia (HCC) 02/22/2019  . Atrial fibrillation with RVR (HCC) 02/26/2019  . COVID-19 02/22/2019  . Flu-like symptoms 02/22/2019  . Hypertension   . Knee osteoarthritis 10/24/2017    Past Surgical History:  Procedure Laterality Date  . NO PAST SURGERIES    . TOTAL KNEE ARTHROPLASTY Right 12/03/2019   Procedure: RIGHT TOTAL KNEE ARTHROPLASTY;  Surgeon: Cammy Copa, MD;  Location: [facility] OR;  Service: Orthopedics;  Laterality: Right;    There were no vitals filed for this visit.   Subjective Assessment - 01/23/20 1433    Subjective Pt. stated using cane in left hand at home, having pain moderate today in knee.    Patient is accompained by: Interpreter    Limitations Sitting;Standing;Walking    Patient Stated Goals improve mobility    Currently in Pain? Yes    Pain Score --   reported moderate   Pain Location Knee    Pain Orientation Right    Pain Descriptors / Indicators Aching;Tightness    Pain Onset 1 to 4 weeks ago    Aggravating Factors  lying on rt side, walking/standing              [facility] PT Assessment - 01/23/20 0001      Assessment   Medical Diagnosis Rt TKA 12/03/2019    Referring  Provider (PT) Dr. August Saucer    Onset Date/Surgical Date 12/03/19      AROM   Right Knee Extension 0    Right Knee Flexion 92      PROM   Right Knee Flexion 100                         [facility] Adult PT Treatment/Exercise - 01/23/20 0001      Ambulation/Gait   Gait Comments SPC use in Lt UE throughout various ambulation distances in clinic between interventions      Knee/Hip Exercises: Stretches   Gastroc Stretch 3 reps;30 seconds;Both      Knee/Hip Exercises: Aerobic   Nustep Lvl 6 10 mins      Knee/Hip Exercises: Machines for Strengthening   Cybex Knee Extension eccentric LAQ Rt 3 x 10 10 lbs    Cybex Knee Flexion Sl 10 lbs 3 x 10 Rt LE      Knee/Hip Exercises: Standing   Forward Step Up Step Height: 6";20 reps;Right                       PT Long Term Goals - 01/07/20 1347      PT LONG  TERM GOAL #1   Title Patient will demonstrate/report pain at worst less than or equal to 2/10 to facilitate minimal limitation in daily activity secondary to pain symptoms.    Time 10    Period Weeks    Status On-going    Target Date 03/03/20      PT LONG TERM GOAL #2   Title Patient will demonstrate independent use of home exercise program to facilitate ability to maintain/progress functional gains from skilled physical therapy services.    Time 10    Period Weeks    Status On-going      PT LONG TERM GOAL #3   Title Patient will demonstrate independent ambulation community distances > 300 ft to facilitate community integration at [facility].    Time 10    Period Weeks    Status On-going      PT LONG TERM GOAL #4   Title Pt. will demonstrate Rt knee AORM 0-110 deg to facilitate ability to perform transfers, walking, stairs at PLOF s limitation.    Time 10    Period Weeks    Status On-going      PT LONG TERM GOAL #5   Title Pt. will demonstrate Rt knee MMT 5/5 throughout to facilitate independent ambulation, stair navigation, transfers at PLOF.    Time 10     Period Weeks    Status On-going      PT LONG TERM GOAL #6   Title Pt. will demonstrate FOTO 59% outcome.    Time 10    Period Weeks    Status On-going                 Plan - 01/23/20 1514    Clinical Impression Statement Pt. does continue to demonstrate flexion mobility deficits though progressing slow and steady.  Pt. may contintue to benefit from skilled PT for strengthening, mobility gains and improved balance control for improved function.    Personal Factors and Comorbidities Comorbidity 1    Comorbidities HTN    Examination-Activity Limitations Sit;Sleep;Squat;Stairs;Stand;Carry;Bend;Transfers;Dressing;Hygiene/Grooming;Lift;Locomotion Level;Reach Overhead    Examination-Participation Restrictions Laundry;Other   home activity   Stability/Clinical Decision Making Stable/Uncomplicated    Rehab Potential Good    PT Frequency 2x / week    PT Duration --   10 weeks   PT Treatment/Interventions ADLs/Self Care Home Management;Cryotherapy;Electrical Stimulation;Iontophoresis 4mg /ml Dexamethasone;Moist Heat;Balance training;Therapeutic exercise;Therapeutic activities;Functional mobility training;Stair training;Gait training;Ultrasound;Neuromuscular re-education;Patient/family education;Manual techniques;Joint Manipulations;Spinal Manipulations;Passive range of motion;Dry needling;Vasopneumatic Device;Taping    PT Next Visit Plan Progress note next visit or two c FOTO update    PT Home Exercise Plan [id number]    Consulted and Agree with Plan of Care Patient           Patient will benefit from skilled therapeutic intervention in order to improve the following deficits and impairments:  Abnormal gait,Decreased endurance,Hypomobility,Decreased activity tolerance,Decreased strength,Pain,Increased fascial restricitons,Increased muscle spasms,Difficulty walking,Decreased mobility,Decreased balance,Decreased range of motion,Improper body mechanics,Impaired flexibility,Decreased  coordination,Impaired perceived functional ability  Visit Diagnosis: Chronic pain of right knee  Muscle weakness (generalized)  Stiffness of right knee, not elsewhere classified  Difficulty in walking, not elsewhere classified  Localized edema     Problem List Patient Active Problem List   Diagnosis Date Noted  . 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 S/P knee surgery 12/03/2019  . 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 Acute kidney injury (HCC) 03/02/2019  . Atrial fibrillation with RVR (HCC) 02/26/2019  . Acute respiratory failure with hypoxia (HCC) 02/22/2019  .  COVID-19 02/22/2019  . Essential hypertension, benign 05/03/2018  . Hypertension 10/24/2017  . Knee osteoarthritis 10/24/2017   Chyrel Masson, PT, DPT, OCS, ATC 01/23/20  3:21 PM    [facility] Physical Therapy [address] [place], [place], [address] Phone: [phone]   Fax:  [phone]  Name: Virdell Hoiland MRN: [id number] Date of Birth: 09-Feb-1950

## 2020-01-28 ENCOUNTER — Ambulatory Visit (INDEPENDENT_AMBULATORY_CARE_PROVIDER_SITE_OTHER): Payer: Self-pay | Admitting: Physical Therapy

## 2020-01-28 ENCOUNTER — Encounter: Payer: Self-pay | Admitting: Physical Therapy

## 2020-01-28 ENCOUNTER — Other Ambulatory Visit: Payer: Self-pay

## 2020-01-28 DIAGNOSIS — M6281 Muscle weakness (generalized): Secondary | ICD-10-CM

## 2020-01-28 DIAGNOSIS — M25561 Pain in right knee: Secondary | ICD-10-CM

## 2020-01-28 DIAGNOSIS — R600 Localized edema: Secondary | ICD-10-CM

## 2020-01-28 DIAGNOSIS — M25562 Pain in left knee: Secondary | ICD-10-CM

## 2020-01-28 DIAGNOSIS — G8929 Other chronic pain: Secondary | ICD-10-CM

## 2020-01-28 DIAGNOSIS — M25661 Stiffness of right knee, not elsewhere classified: Secondary | ICD-10-CM

## 2020-01-28 DIAGNOSIS — R262 Difficulty in walking, not elsewhere classified: Secondary | ICD-10-CM

## 2020-01-28 NOTE — Therapy (Signed)
[facility] Physical Therapy [address] [place], [place], [address] Phone: [phone]   Fax:  [phone]  Physical Therapy Treatment  Patient Details  Name: [name] [name] MRN: [id number] Date of Birth: 04-Jan-1951 Referring Provider (PT): Dr. August Saucer   Encounter Date: 01/28/2020   PT End of Session - 01/28/20 1347    Visit Number 9    Number of Visits 12    Date for PT Re-Evaluation 03/03/20    Progress Note Due on Visit 10    PT Start Time 1300    PT Stop Time 1343    PT Time Calculation (min) 43 min    Activity Tolerance Patient tolerated treatment well    Behavior During Therapy [facility] for tasks assessed/performed           Past Medical History:  Diagnosis Date   Acute kidney injury (HCC) 03/02/2019   Acute respiratory failure with hypoxia (HCC) 02/22/2019   Atrial fibrillation with RVR (HCC) 02/26/2019   COVID-19 02/22/2019   Flu-like symptoms 02/22/2019   Hypertension    Knee osteoarthritis 10/24/2017    Past Surgical History:  Procedure Laterality Date   NO PAST SURGERIES     TOTAL KNEE ARTHROPLASTY Right 12/03/2019   Procedure: RIGHT TOTAL KNEE ARTHROPLASTY;  Surgeon: Cammy Copa, MD;  Location: [facility] OR;  Service: Orthopedics;  Laterality: Right;    There were no vitals filed for this visit.   Subjective Assessment - 01/28/20 1307    Subjective used forearm crutch today, still having some pain in Rt knee.    Patient is accompained by: Interpreter    Limitations Sitting;Standing;Walking    Patient Stated Goals improve mobility    Currently in Pain? Yes    Pain Score 5     Pain Location Knee    Pain Orientation Right    Pain Descriptors / Indicators Aching;Tightness    Pain Type Acute pain;Surgical pain    Pain Onset 1 to 4 weeks ago    Pain Frequency Constant    Aggravating Factors  lying on Rt side, walking/standing    Pain Relieving Factors medication                             [facility]  Adult PT Treatment/Exercise - 01/28/20 1308      Ambulation/Gait   Ambulation/Gait Yes    Ambulation/Gait Assistance 6: Modified independent (Device/Increase time)    Assistive device L Forearm Crutch    Gait Comments within clinic - demonstrating safe use of forearm crutch      Knee/Hip Exercises: Aerobic   Recumbent Bike Seat 6 x 8 min; partial revolutions      Knee/Hip Exercises: Standing   Lateral Step Up Right;2 sets;10 reps;Hand Hold: 2;Step Height: 6"    Forward Step Up Step Height: 6";20 reps;Right      Knee/Hip Exercises: Seated   Long Arc Quad 3 sets;10 reps    Long Arc Quad Weight 6 lbs.    Sit to Sand 3 sets;10 reps;without UE support                       PT Long Term Goals - 01/07/20 1347      PT LONG TERM GOAL #1   Title Patient will demonstrate/report pain at worst less than or equal to 2/10 to facilitate minimal limitation in daily activity secondary to pain symptoms.    Time 10    Period Weeks  Status On-going    Target Date 03/03/20      PT LONG TERM GOAL #2   Title Patient will demonstrate independent use of home exercise program to facilitate ability to maintain/progress functional gains from skilled physical therapy services.    Time 10    Period Weeks    Status On-going      PT LONG TERM GOAL #3   Title Patient will demonstrate independent ambulation community distances > 300 ft to facilitate community integration at [facility].    Time 10    Period Weeks    Status On-going      PT LONG TERM GOAL #4   Title Pt. will demonstrate Rt knee AORM 0-110 deg to facilitate ability to perform transfers, walking, stairs at PLOF s limitation.    Time 10    Period Weeks    Status On-going      PT LONG TERM GOAL #5   Title Pt. will demonstrate Rt knee MMT 5/5 throughout to facilitate independent ambulation, stair navigation, transfers at PLOF.    Time 10    Period Weeks    Status On-going      PT LONG TERM GOAL #6   Title Pt. will demonstrate  FOTO 59% outcome.    Time 10    Period Weeks    Status On-going                 Plan - 01/28/20 1347    Clinical Impression Statement Pt demonstrates safe, slow amb in clinic with Lt forearm crutch.  Will continue to benefit from PT to maximize function.    Personal Factors and Comorbidities Comorbidity 1    Comorbidities HTN    Examination-Activity Limitations Sit;Sleep;Squat;Stairs;Stand;Carry;Bend;Transfers;Dressing;Hygiene/Grooming;Lift;Locomotion Level;Reach Overhead    Examination-Participation Restrictions Laundry;Other   home activity   Stability/Clinical Decision Making Stable/Uncomplicated    Rehab Potential Good    PT Frequency 2x / week    PT Duration --   10 weeks   PT Treatment/Interventions ADLs/Self Care Home Management;Cryotherapy;Electrical Stimulation;Iontophoresis 4mg /ml Dexamethasone;Moist Heat;Balance training;Therapeutic exercise;Therapeutic activities;Functional mobility training;Stair training;Gait training;Ultrasound;Neuromuscular re-education;Patient/family education;Manual techniques;Joint Manipulations;Spinal Manipulations;Passive range of motion;Dry needling;Vasopneumatic Device;Taping    PT Next Visit Plan Progress note next visit or two c FOTO update    PT Home Exercise Plan [id number]    Consulted and Agree with Plan of Care Patient           Patient will benefit from skilled therapeutic intervention in order to improve the following deficits and impairments:  Abnormal gait,Decreased endurance,Hypomobility,Decreased activity tolerance,Decreased strength,Pain,Increased fascial restricitons,Increased muscle spasms,Difficulty walking,Decreased mobility,Decreased balance,Decreased range of motion,Improper body mechanics,Impaired flexibility,Decreased coordination,Impaired perceived functional ability  Visit Diagnosis: Chronic pain of right knee  Muscle weakness (generalized)  Stiffness of right knee, not elsewhere classified  Difficulty in walking,  not elsewhere classified  Localized edema  Chronic pain of left knee     Problem List Patient Active Problem List   Diagnosis Date Noted   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 [place], [place], [place] Phone: [phone]  Fax:  [phone]  Name: [name] [name] MRN: [id number] Date of Birth: 1950-11-10

## 2020-01-29 ENCOUNTER — Telehealth: Payer: Self-pay

## 2020-01-29 NOTE — Telephone Encounter (Signed)
Can you follow up on this and try to get patient scheduled? I tried calling patient to schedule appt for follow up for this week or next. No answer and unable to LM VM is full.

## 2020-01-29 NOTE — Telephone Encounter (Signed)
-----   Message from Clydie Braun, [place] sent at 01/29/2020  8:30 AM EST ----- Regarding: FW: Follow up visit I'll let you work her in somewhere that is best ----- Message ----- From: Julieanne Cotton, PA-C Sent: 01/28/2020   9:22 PM EST To: Clydie Braun, RMA Subject: RE: Follow up visit                            Needs to be seen back this week or next, thank you ----- Message ----- From: Clydie Braun, RMA Sent: 01/28/2020   1:49 PM EST To: Julieanne Cotton, PA-C Subject: FW: Follow up visit                            When does she need to be seen back? ----- Message ----- From: Clarita Crane, PT Sent: 01/28/2020   1:27 PM EST To: Prescott Parma, RT Subject: Follow up visit                                Leotis ShamesDelorse Limber doesn't have a follow up with Dr. August Saucer scheduled.  Can you work with her case manager to get a follow up scheduled?    Thanks- Judeth Cornfield

## 2020-02-05 ENCOUNTER — Other Ambulatory Visit: Payer: Self-pay

## 2020-02-05 ENCOUNTER — Ambulatory Visit (INDEPENDENT_AMBULATORY_CARE_PROVIDER_SITE_OTHER): Payer: Self-pay | Admitting: Physical Therapy

## 2020-02-05 ENCOUNTER — Encounter: Payer: Self-pay | Admitting: Physical Therapy

## 2020-02-05 DIAGNOSIS — M25661 Stiffness of right knee, not elsewhere classified: Secondary | ICD-10-CM

## 2020-02-05 DIAGNOSIS — M6281 Muscle weakness (generalized): Secondary | ICD-10-CM

## 2020-02-05 DIAGNOSIS — R262 Difficulty in walking, not elsewhere classified: Secondary | ICD-10-CM

## 2020-02-05 DIAGNOSIS — M25561 Pain in right knee: Secondary | ICD-10-CM

## 2020-02-05 DIAGNOSIS — R600 Localized edema: Secondary | ICD-10-CM

## 2020-02-05 DIAGNOSIS — G8929 Other chronic pain: Secondary | ICD-10-CM

## 2020-02-05 NOTE — Therapy (Signed)
[facility] Physical Therapy [address] [place], [place], [address] Phone: [phone]   Fax:  [phone]  Physical Therapy Treatment  Patient Details  Name: [name] [name] MRN: [id number] Date of Birth: August 30, 1950 Referring Provider (PT): Dr. August Saucer   Encounter Date: 02/05/2020   PT End of Session - 02/05/20 1112    Visit Number 10    Number of Visits 20   adjusted to reflect frequency/duration of POC   Date for PT Re-Evaluation 03/03/20    Progress Note Due on Visit 10    PT Start Time 1015    PT Stop Time 1057    PT Time Calculation (min) 42 min    Activity Tolerance Patient tolerated treatment well    Behavior During Therapy [facility] for tasks assessed/performed           Past Medical History:  Diagnosis Date  . Acute kidney injury (HCC) 03/02/2019  . Acute respiratory failure with hypoxia (HCC) 02/22/2019  . Atrial fibrillation with RVR (HCC) 02/26/2019  . COVID-19 02/22/2019  . Flu-like symptoms 02/22/2019  . Hypertension   . Knee osteoarthritis 10/24/2017    Past Surgical History:  Procedure Laterality Date  . NO PAST SURGERIES    . TOTAL KNEE ARTHROPLASTY Right 12/03/2019   Procedure: RIGHT TOTAL KNEE ARTHROPLASTY;  Surgeon: Cammy Copa, MD;  Location: [facility] OR;  Service: Orthopedics;  Laterality: Right;    There were no vitals filed for this visit.   Subjective Assessment - 02/05/20 1017    Subjective using walker today because she was having a little pain this morning, no pain now.    Patient is accompained by: Interpreter    Limitations Sitting;Standing;Walking    Patient Stated Goals improve mobility    Currently in Pain? No/denies    Pain Onset 1 to 4 weeks ago              [facility] PT Assessment - 02/05/20 1040      AROM   Right Knee Extension 0    Right Knee Flexion 97   103 after heel slides                        [facility] Adult PT Treatment/Exercise - 02/05/20 1017      Knee/Hip Exercises: Aerobic    Recumbent Bike Seat 6 x 8 min; partial revolutions - full revolutions last 3-4 cycles      Knee/Hip Exercises: Standing   Lateral Step Up Right;2 sets;10 reps;Hand Hold: 2;Step Height: 6"    Forward Step Up Step Height: 6";20 reps;Right;Hand Hold: 2    Other Standing Knee Exercises tandem walking with 1UE support forward/backward x 4 reps      Knee/Hip Exercises: Seated   Long Arc Quad 3 sets;10 reps    Long Arc Quad Weight 4 lbs.      Knee/Hip Exercises: Supine   Heel Slides AAROM;Right;20 reps    Heel Slides Limitations supine with strap                       PT Long Term Goals - 02/05/20 1113      PT LONG TERM GOAL #1   Title Patient will demonstrate/report pain at worst less than or equal to 2/10 to facilitate minimal limitation in daily activity secondary to pain symptoms.    Time 10    Period Weeks    Status On-going    Target Date 03/03/20  PT LONG TERM GOAL #2   Title Patient will demonstrate independent use of home exercise program to facilitate ability to maintain/progress functional gains from skilled physical therapy services.    Time 10    Period Weeks    Status On-going      PT LONG TERM GOAL #3   Title Patient will demonstrate independent ambulation community distances > 300 ft to facilitate community integration at [facility].    Time 10    Period Weeks    Status On-going      PT LONG TERM GOAL #4   Title Pt. will demonstrate Rt knee AORM 0-110 deg to facilitate ability to perform transfers, walking, stairs at PLOF s limitation.    Baseline 1/5: 0-103    Time 10    Period Weeks    Status On-going      PT LONG TERM GOAL #5   Title Pt. will demonstrate Rt knee MMT 5/5 throughout to facilitate independent ambulation, stair navigation, transfers at PLOF.    Time 10    Period Weeks    Status On-going      PT LONG TERM GOAL #6   Title Pt. will demonstrate FOTO 59% outcome.    Time 10    Period Weeks    Status On-going                  Plan - 02/05/20 1113    Clinical Impression Statement Pt progressing well with PT at this time, and feel she is ready to wean from [place], but she is reluctant to at this time.  Minimal pain except pushing end range flexion at this time.  Will continue to benefit from PT to maximize function.    Personal Factors and Comorbidities Comorbidity 1    Comorbidities HTN    Examination-Activity Limitations Sit;Sleep;Squat;Stairs;Stand;Carry;Bend;Transfers;Dressing;Hygiene/Grooming;Lift;Locomotion Level;Reach Overhead    Examination-Participation Restrictions Laundry;Other   home activity   Stability/Clinical Decision Making Stable/Uncomplicated    Rehab Potential Good    PT Frequency 2x / week    PT Duration --   10 weeks   PT Treatment/Interventions ADLs/Self Care Home Management;Cryotherapy;Electrical Stimulation;Iontophoresis 4mg /ml Dexamethasone;Moist Heat;Balance training;Therapeutic exercise;Therapeutic activities;Functional mobility training;Stair training;Gait training;Ultrasound;Neuromuscular re-education;Patient/family education;Manual techniques;Joint Manipulations;Spinal Manipulations;Passive range of motion;Dry needling;Vasopneumatic Device;Taping    PT Next Visit Plan check FOTO, to MD 1/14 so will need note before then    PT [place] and Agree with Plan of Care Patient           Patient will benefit from skilled therapeutic intervention in order to improve the following deficits and impairments:  Abnormal gait,Decreased endurance,Hypomobility,Decreased activity tolerance,Decreased strength,Pain,Increased fascial restricitons,Increased muscle spasms,Difficulty walking,Decreased mobility,Decreased balance,Decreased range of motion,Improper body mechanics,Impaired flexibility,Decreased coordination,Impaired perceived functional ability  Visit Diagnosis: Chronic pain of right knee  Muscle weakness (generalized)  Stiffness of right knee, not  elsewhere classified  Difficulty in walking, not elsewhere classified  Localized edema     Problem List Patient Active Problem List   Diagnosis Date Noted  . 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 S/P knee surgery 12/03/2019  . Type 2 diabetes mellitus with obesity ([place]) 07/22/2019  . Acute kidney injury ([place]) 03/02/2019  . Atrial fibrillation with RVR ([place]) 02/26/2019  . Acute respiratory failure with hypoxia ([place]) 02/22/2019  . COVID-19 02/22/2019  . Essential hypertension, benign 05/03/2018  . Hypertension 10/24/2017  . Knee osteoarthritis 10/24/2017      Laureen Abrahams, PT, DPT 02/05/20 11:16 AM  [facility] Physical Therapy [address] [place], [place], [address] Phone: [phone]   Fax:  [phone]  Name: [name] [name] MRN: [id number] Date of Birth: 09-01-1950

## 2020-02-07 ENCOUNTER — Other Ambulatory Visit: Payer: Self-pay

## 2020-02-07 ENCOUNTER — Encounter: Payer: Self-pay | Admitting: Rehabilitative and Restorative Service Providers"

## 2020-02-07 ENCOUNTER — Ambulatory Visit (INDEPENDENT_AMBULATORY_CARE_PROVIDER_SITE_OTHER): Payer: Self-pay | Admitting: Rehabilitative and Restorative Service Providers"

## 2020-02-07 DIAGNOSIS — G8929 Other chronic pain: Secondary | ICD-10-CM

## 2020-02-07 DIAGNOSIS — R262 Difficulty in walking, not elsewhere classified: Secondary | ICD-10-CM

## 2020-02-07 DIAGNOSIS — M25561 Pain in right knee: Secondary | ICD-10-CM

## 2020-02-07 DIAGNOSIS — M25661 Stiffness of right knee, not elsewhere classified: Secondary | ICD-10-CM

## 2020-02-07 DIAGNOSIS — M6281 Muscle weakness (generalized): Secondary | ICD-10-CM

## 2020-02-07 DIAGNOSIS — R600 Localized edema: Secondary | ICD-10-CM

## 2020-02-07 NOTE — Therapy (Signed)
[facility] Physical Therapy [address] [place], [place], [address] Phone: [phone]   Fax:  [phone]  Physical Therapy Treatment  Patient Details  Name: [name] [name] MRN: [id number] Date of Birth: [birth year] Referring Provider (PT): Dr. Marlou Sa   Encounter Date: 02/07/2020   PT End of Session - 02/07/20 1145    Visit Number 11    Number of Visits 20   adjusted to reflect frequency/duration of POC   Date for PT Re-Evaluation 03/03/20    Progress Note Due on Visit 12    PT Start Time 1140    PT Stop Time 1220    PT Time Calculation (min) 40 min    Activity Tolerance Patient tolerated treatment well    Behavior During Therapy [facility] for tasks assessed/performed           Past Medical History:  Diagnosis Date  . Acute kidney injury ([place]) 03/02/2019  . Acute respiratory failure with hypoxia ([place]) 02/22/2019  . Atrial fibrillation with RVR ([place]) 02/26/2019  . COVID-19 02/22/2019  . Flu-like symptoms 02/22/2019  . Hypertension   . Knee osteoarthritis 10/24/2017    Past Surgical History:  Procedure Laterality Date  . NO PAST SURGERIES    . TOTAL KNEE ARTHROPLASTY Right 12/03/2019   Procedure: RIGHT TOTAL KNEE ARTHROPLASTY;  Surgeon: Meredith Pel, MD;  Location: [place];  Service: Orthopedics;  Laterality: Right;    There were no vitals filed for this visit.   Subjective Assessment - 02/07/20 1144    Subjective Pt. indicated today seemed like a good day so far, a little pain reported.  Pt. stated wanting to adjust crutch height.    Patient is accompained by: Interpreter    Limitations Sitting;Standing;Walking    Patient Stated Goals improve mobility    Currently in Pain? Yes    Pain Score --   very mild pain   Pain Onset 1 to 4 weeks ago              [facility] PT Assessment - 02/07/20 0001      Assessment   Medical Diagnosis Rt TKA 12/03/2019    Referring Provider (PT) Dr. Marlou Sa    Onset Date/Surgical Date 12/03/19       Observation/Other Assessments   Focus on Therapeutic Outcomes (FOTO)  update: 33%                         [facility] Adult PT Treatment/Exercise - 02/07/20 0001      Ambulation/Gait   Ambulation/Gait Assistance 6: Modified independent (Device/Increase time)    Assistive device L Forearm Crutch      Knee/Hip Exercises: Aerobic   Recumbent Bike Seat 4 partial revolutions 7 mins      Knee/Hip Exercises: Machines for Strengthening   Cybex Knee Extension eccentric LAQ Rt 3 x 10 10 lbs    Cybex Knee Flexion Sl 10 lbs 3 x 10 Rt LE      Knee/Hip Exercises: Standing   Forward Step Up Step Height: 6";20 reps;Right;Hand Hold: 1   Lt UE HHA                      PT Long Term Goals - 02/05/20 1113      PT LONG TERM GOAL #1   Title Patient will demonstrate/report pain at worst less than or equal to 2/10 to facilitate minimal limitation in daily activity secondary to pain symptoms.    Time 10  Period Weeks    Status On-going    Target Date 03/03/20      PT LONG TERM GOAL #2   Title Patient will demonstrate independent use of home exercise program to facilitate ability to maintain/progress functional gains from skilled physical therapy services.    Time 10    Period Weeks    Status On-going      PT LONG TERM GOAL #3   Title Patient will demonstrate independent ambulation community distances > 300 ft to facilitate community integration at [facility].    Time 10    Period Weeks    Status On-going      PT LONG TERM GOAL #4   Title Pt. will demonstrate Rt knee AORM 0-110 deg to facilitate ability to perform transfers, walking, stairs at PLOF s limitation.    Baseline 1/5: 0-103    Time 10    Period Weeks    Status On-going      PT LONG TERM GOAL #5   Title Pt. will demonstrate Rt knee MMT 5/5 throughout to facilitate independent ambulation, stair navigation, transfers at PLOF.    Time 10    Period Weeks    Status On-going      PT LONG TERM GOAL #6   Title Pt.  will demonstrate FOTO 59% outcome.    Time 10    Period Weeks    Status On-going                 Plan - 02/07/20 1146    Clinical Impression Statement FOTO reassessment relatively unchaged compared to evaluation despite objective data gains and symptom reduction reports on average.  Improved control c forearm crutch/SPC use in clinic observed compared to previous.    Personal Factors and Comorbidities Comorbidity 1    Comorbidities HTN    Examination-Activity Limitations Sit;Sleep;Squat;Stairs;Stand;Carry;Bend;Transfers;Dressing;Hygiene/Grooming;Lift;Locomotion Level;Reach Overhead    Examination-Participation Restrictions Laundry;Other   home activity   Stability/Clinical Decision Making Stable/Uncomplicated    Rehab Potential Good    PT Frequency 2x / week    PT Duration --   10 weeks   PT Treatment/Interventions ADLs/Self Care Home Management;Cryotherapy;Electrical Stimulation;Iontophoresis 4mg /ml Dexamethasone;Moist Heat;Balance training;Therapeutic exercise;Therapeutic activities;Functional mobility training;Stair training;Gait training;Ultrasound;Neuromuscular re-education;Patient/family education;Manual techniques;Joint Manipulations;Spinal Manipulations;Passive range of motion;Dry needling;Vasopneumatic Device;Taping    PT Next Visit Plan Formal progress note next visit (FOTO performed today).    PT Home Exercise Plan [id number]    Consulted and Agree with Plan of Care Patient           Patient will benefit from skilled therapeutic intervention in order to improve the following deficits and impairments:  Abnormal gait,Decreased endurance,Hypomobility,Decreased activity tolerance,Decreased strength,Pain,Increased fascial restricitons,Increased muscle spasms,Difficulty walking,Decreased mobility,Decreased balance,Decreased range of motion,Improper body mechanics,Impaired flexibility,Decreased coordination,Impaired perceived functional ability  Visit Diagnosis: Chronic pain of  right knee  Muscle weakness (generalized)  Stiffness of right knee, not elsewhere classified  Difficulty in walking, not elsewhere classified  Localized edema     Problem List Patient Active Problem List   Diagnosis Date Noted  . 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 S/P knee surgery 12/03/2019  . 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 Acute kidney injury (HCC) 03/02/2019  . Atrial fibrillation with RVR (HCC) 02/26/2019  . Acute respiratory failure with hypoxia (HCC) 02/22/2019  . COVID-19 02/22/2019  . Essential hypertension, benign 05/03/2018  . Hypertension 10/24/2017  . Knee osteoarthritis 10/24/2017   10/26/2017, PT, DPT, OCS, ATC 02/07/20  12:17 PM    [place]  [facility] Physical Therapy [address] [place], [place], [address] Phone: [phone]   Fax:  [phone]  Name: [name] [name] MRN: [id number] Date of Birth: [birth year]

## 2020-02-11 ENCOUNTER — Other Ambulatory Visit: Payer: Self-pay

## 2020-02-11 ENCOUNTER — Ambulatory Visit (INDEPENDENT_AMBULATORY_CARE_PROVIDER_SITE_OTHER): Payer: Self-pay | Admitting: Rehabilitative and Restorative Service Providers"

## 2020-02-11 DIAGNOSIS — R600 Localized edema: Secondary | ICD-10-CM

## 2020-02-11 DIAGNOSIS — R262 Difficulty in walking, not elsewhere classified: Secondary | ICD-10-CM

## 2020-02-11 DIAGNOSIS — M6281 Muscle weakness (generalized): Secondary | ICD-10-CM

## 2020-02-11 DIAGNOSIS — M25561 Pain in right knee: Secondary | ICD-10-CM

## 2020-02-11 DIAGNOSIS — G8929 Other chronic pain: Secondary | ICD-10-CM

## 2020-02-11 DIAGNOSIS — M25661 Stiffness of right knee, not elsewhere classified: Secondary | ICD-10-CM

## 2020-02-11 NOTE — Therapy (Signed)
[facility] Therapy [address] [place], [place], [address] Phone: [phone]   Fax:  [phone]  Physical Therapy Treatment/Progress Note  Patient Details  Name: [name] [name] MRN: [id number] Date of Birth: 03/23/1950 Referring Provider (PT): Dr. August Saucer   Encounter Date: 02/11/2020   Progress Note Reporting Period 12/24/2019  to 02/11/2020  See note below for Objective Data and Assessment of Progress/Goals.        PT End of Session - 02/11/20 1423    Visit Number 12    Number of Visits 20   adjusted to reflect frequency/duration of POC   Date for PT Re-Evaluation 03/03/20    Progress Note Due on Visit 20    PT Start Time 1343    PT Stop Time 1425    PT Time Calculation (min) 42 min    Activity Tolerance Patient limited by pain    Behavior During Therapy [facility] for tasks assessed/performed           Past Medical History:  Diagnosis Date  . Acute kidney injury (HCC) 03/02/2019  . Acute respiratory failure with hypoxia (HCC) 02/22/2019  . Atrial fibrillation with RVR (HCC) 02/26/2019  . COVID-19 02/22/2019  . Flu-like symptoms 02/22/2019  . Hypertension   . Knee osteoarthritis 10/24/2017    Past Surgical History:  Procedure Laterality Date  . NO PAST SURGERIES    . TOTAL KNEE ARTHROPLASTY Right 12/03/2019   Procedure: RIGHT TOTAL KNEE ARTHROPLASTY;  Surgeon: Cammy Copa, MD;  Location: [facility] OR;  Service: Orthopedics;  Laterality: Right;    There were no vitals filed for this visit.   Subjective Assessment - 02/11/20 1348    Subjective Pt. indicated feeling fairly well today.  Pt. reported overall improvement about 50% when taking medicine.    Patient is accompained by: Interpreter    Limitations Sitting;Standing;Walking    Patient Stated Goals improve mobility    Currently in Pain? Yes    Pain Score 7    pain at worst   Pain Location Knee    Pain Orientation Right    Pain Descriptors / Indicators  Aching;Tightness;Throbbing    Pain Type Surgical pain    Pain Onset 1 to 4 weeks ago    Pain Frequency Intermittent    Aggravating Factors  not taking medicine, standing/walking prolonged.    Pain Relieving Factors medicine    Effect of Pain on Daily Activities Limited standing/walking independence              [facility] PT Assessment - 02/11/20 0001      Assessment   Medical Diagnosis Rt TKA 12/03/2019    Referring Provider (PT) Dr. August Saucer    Onset Date/Surgical Date 12/03/19      Observation/Other Assessments   Focus on Therapeutic Outcomes (FOTO)  update: 33%   from 02/07/2020 update     AROM   Right Knee Extension 0    Right Knee Flexion 100      Ambulation/Gait   Ambulation/Gait Assistance 6: Modified independent (Device/Increase time)    Assistive device L Forearm Crutch    Gait Comments Able to perform indepnedent ambulation in clinic c shortened step length bilateral and reduced gait speed but independently performed 100 ft                         [facility] Adult PT Treatment/Exercise - 02/11/20 0001      Neuro Re-ed    Neuro Re-ed Details  tandem stance  1 min x 1 bilateral c occasional HHA, alt toe tapping 9 inch block x 10 bilateral c marked difficulty in Rt LE lifting secondary to weakness (min A required at times)      Knee/Hip Exercises: Aerobic   Recumbent Bike seat 5 partial rev 5 mins full rev reverse 4 mins      Knee/Hip Exercises: Supine   Heel Slides Right;15 reps    Straight Leg Raises 2 sets;10 reps;Right      Manual Therapy   Manual therapy comments Rt knee flexion, ir, distraction MWM                       PT Long Term Goals - 02/11/20 1352      PT LONG TERM GOAL #1   Title Patient will demonstrate/report pain at worst less than or equal to 2/10 to facilitate minimal limitation in daily activity secondary to pain symptoms.    Time 10    Period Weeks    Status On-going    Target Date 03/03/20      PT LONG TERM GOAL #2    Title Patient will demonstrate independent use of home exercise program to facilitate ability to maintain/progress functional gains from skilled physical therapy services.    Time 10    Period Weeks    Status On-going    Target Date 03/03/20      PT LONG TERM GOAL #3   Title Patient will demonstrate independent ambulation community distances > 300 ft to facilitate community integration at [facility].    Time 10    Period Weeks    Status On-going    Target Date 03/03/20      PT LONG TERM GOAL #4   Title Pt. will demonstrate Rt knee AORM 0-110 deg to facilitate ability to perform transfers, walking, stairs at PLOF s limitation.    Time 10    Period Weeks    Status On-going    Target Date 03/03/20      PT LONG TERM GOAL #5   Title Pt. will demonstrate Rt knee MMT 5/5 throughout to facilitate independent ambulation, stair navigation, transfers at PLOF.    Time 10    Period Weeks    Status On-going    Target Date 03/03/20      PT LONG TERM GOAL #6   Title Pt. will demonstrate FOTO 59% outcome.    Time 10    Period Weeks    Status On-going    Target Date 03/03/20                 Plan - 02/11/20 1404    Clinical Impression Statement Pt. has attended 12 visits overall during course of treatment.  Pt. reported overall improvement around 50 % at this time c symptoms up to 7/10 at worst without medicine.  See objective data for updated information.  Pt. has demonstrated improvements as documented but still shows Rt knee pain c mobility, strength and movement coordination deficits that create increased difficulty c daily activity and mobility when compared to PLOF.  Continued skilled PT services indicated to help at this time to reach PLOF and goals.    Personal Factors and Comorbidities Comorbidity 1    Comorbidities HTN    Examination-Activity Limitations Sit;Sleep;Squat;Stairs;Stand;Carry;Bend;Transfers;Dressing;Hygiene/Grooming;Lift;Locomotion Level;Reach Overhead     Examination-Participation Restrictions Laundry;Other   home activity   Stability/Clinical Decision Making Stable/Uncomplicated    Rehab Potential Good    PT Frequency 2x / week  PT Duration --   10 weeks   PT Treatment/Interventions ADLs/Self Care Home Management;Cryotherapy;Electrical Stimulation;Iontophoresis 4mg /ml Dexamethasone;Moist Heat;Balance training;Therapeutic exercise;Therapeutic activities;Functional mobility training;Stair training;Gait training;Ultrasound;Neuromuscular re-education;Patient/family education;Manual techniques;Joint Manipulations;Spinal Manipulations;Passive range of motion;Dry needling;Vasopneumatic Device;Taping    PT Next Visit Plan Continued skilled PT for quad strength, flexion mobility gains and improved balance control.    PT Home Exercise Plan [id number]    Consulted and Agree with Plan of Care Patient           Patient will benefit from skilled therapeutic intervention in order to improve the following deficits and impairments:  Abnormal gait,Decreased endurance,Hypomobility,Decreased activity tolerance,Decreased strength,Pain,Increased fascial restricitons,Increased muscle spasms,Difficulty walking,Decreased mobility,Decreased balance,Decreased range of motion,Improper body mechanics,Impaired flexibility,Decreased coordination,Impaired perceived functional ability  Visit Diagnosis: Chronic pain of right knee  Muscle weakness (generalized)  Stiffness of right knee, not elsewhere classified  Difficulty in walking, not elsewhere classified  Localized edema     Problem List Patient Active Problem List   Diagnosis Date Noted  . 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 S/P knee surgery 12/03/2019  . 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 Acute kidney injury (HCC) 03/02/2019  . Atrial fibrillation with RVR (HCC) 02/26/2019  . Acute respiratory failure with hypoxia (HCC) 02/22/2019  . COVID-19 02/22/2019  . Essential  hypertension, benign 05/03/2018  . Hypertension 10/24/2017  . Knee osteoarthritis 10/24/2017   10/26/2017, PT, DPT, OCS, ATC 02/11/20  2:26 PM    [facility] Physical Therapy [address] [place], [place], [place] Phone: [phone]   Fax:  [phone]  Name: Briar Sword MRN: Avelina Laine Date of Birth: Sep 18, 1950

## 2020-02-13 ENCOUNTER — Ambulatory Visit (INDEPENDENT_AMBULATORY_CARE_PROVIDER_SITE_OTHER): Payer: Self-pay | Admitting: Physical Therapy

## 2020-02-13 ENCOUNTER — Other Ambulatory Visit: Payer: Self-pay

## 2020-02-13 ENCOUNTER — Encounter: Payer: Self-pay | Admitting: Physical Therapy

## 2020-02-13 DIAGNOSIS — G8929 Other chronic pain: Secondary | ICD-10-CM

## 2020-02-13 DIAGNOSIS — M25561 Pain in right knee: Secondary | ICD-10-CM

## 2020-02-13 DIAGNOSIS — M6281 Muscle weakness (generalized): Secondary | ICD-10-CM

## 2020-02-13 DIAGNOSIS — R600 Localized edema: Secondary | ICD-10-CM

## 2020-02-13 DIAGNOSIS — M25661 Stiffness of right knee, not elsewhere classified: Secondary | ICD-10-CM

## 2020-02-13 DIAGNOSIS — R262 Difficulty in walking, not elsewhere classified: Secondary | ICD-10-CM

## 2020-02-13 NOTE — Therapy (Signed)
[facility] Physical Therapy [address] [place], [place], [address] Phone: [phone]   Fax:  [phone]  Physical Therapy Treatment  Patient Details  Name: [name] [name] MRN: [id number] Date of Birth: [birth year] Referring Provider (PT): Dr. August Saucer   Encounter Date: 02/13/2020   PT End of Session - 02/13/20 1347    Visit Number 13    Number of Visits 20   adjusted to reflect frequency/duration of POC   Date for PT Re-Evaluation 03/03/20    Progress Note Due on Visit 20    PT Start Time 1317   pt arrived late   PT Stop Time 1343    PT Time Calculation (min) 26 min    Activity Tolerance Patient limited by pain    Behavior During Therapy [facility] for tasks assessed/performed           Past Medical History:  Diagnosis Date  . Acute kidney injury (HCC) 03/02/2019  . Acute respiratory failure with hypoxia (HCC) 02/22/2019  . Atrial fibrillation with RVR (HCC) 02/26/2019  . COVID-19 02/22/2019  . Flu-like symptoms 02/22/2019  . Hypertension   . Knee osteoarthritis 10/24/2017    Past Surgical History:  Procedure Laterality Date  . NO PAST SURGERIES    . TOTAL KNEE ARTHROPLASTY Right 12/03/2019   Procedure: RIGHT TOTAL KNEE ARTHROPLASTY;  Surgeon: Cammy Copa, MD;  Location: [facility] OR;  Service: Orthopedics;  Laterality: Right;    There were no vitals filed for this visit.   Subjective Assessment - 02/13/20 1320    Subjective arrived late today - reports transportation was late picking her up; Lt knee is hurting today she thinks it's because of walking with the cane    Patient is accompained by: Interpreter    Limitations Sitting;Standing;Walking    Patient Stated Goals improve mobility    Currently in Pain? Yes    Pain Score 0-No pain    Pain Location Knee    Pain Orientation Right    Pain Onset 1 to 4 weeks ago    Multiple Pain Sites Yes    Pain Score 8    Pain Location Knee    Pain Orientation Left    Pain Descriptors / Indicators  Sharp;Sore;Aching    Pain Type Acute pain    Pain Onset In the past 7 days    Pain Frequency Constant    Aggravating Factors  using cane for walking    Pain Relieving Factors using walker              [facility] PT Assessment - 02/13/20 0001      Assessment   Medical Diagnosis Rt TKA 12/03/2019    Referring Provider (PT) Dr. August Saucer    Onset Date/Surgical Date 12/03/19      Observation/Other Assessments   Focus on Therapeutic Outcomes (FOTO)  update: 33%   from 02/07/2020 update     AROM   Right Knee Extension 0    Right Knee Flexion 100                         [facility] Adult PT Treatment/Exercise - 02/13/20 1323      Knee/Hip Exercises: Aerobic   Recumbent Bike partial revolutions seat 7 x 6 min      Knee/Hip Exercises: Machines for Strengthening   Cybex Knee Extension eccentric LAQ Rt 3 x 10 10 lbs    Cybex Knee Flexion RLT 15# 3x10      Knee/Hip Exercises: Standing  Heel Raises Both;20 reps    Lateral Step Up Right;10 reps;Hand Hold: 1;Step Height: 6"    Forward Step Up Right;10 reps;Hand Hold: 1;Step Height: 6"                       PT Long Term Goals - 02/11/20 1352      PT LONG TERM GOAL #1   Title Patient will demonstrate/report pain at worst less than or equal to 2/10 to facilitate minimal limitation in daily activity secondary to pain symptoms.    Time 10    Period Weeks    Status On-going    Target Date 03/03/20      PT LONG TERM GOAL #2   Title Patient will demonstrate independent use of home exercise program to facilitate ability to maintain/progress functional gains from skilled physical therapy services.    Time 10    Period Weeks    Status On-going    Target Date 03/03/20      PT LONG TERM GOAL #3   Title Patient will demonstrate independent ambulation community distances > 300 ft to facilitate community integration at [facility].    Time 10    Period Weeks    Status On-going    Target Date 03/03/20      PT LONG TERM GOAL #4    Title Pt. will demonstrate Rt knee AORM 0-110 deg to facilitate ability to perform transfers, walking, stairs at PLOF s limitation.    Time 10    Period Weeks    Status On-going    Target Date 03/03/20      PT LONG TERM GOAL #5   Title Pt. will demonstrate Rt knee MMT 5/5 throughout to facilitate independent ambulation, stair navigation, transfers at PLOF.    Time 10    Period Weeks    Status On-going    Target Date 03/03/20      PT LONG TERM GOAL #6   Title Pt. will demonstrate FOTO 59% outcome.    Time 10    Period Weeks    Status On-going    Target Date 03/03/20                 Plan - 02/13/20 1347    Clinical Impression Statement Pt arrived late to session today so limited exercise today.  Her Rt knee is doing well from functional standpoint, and has new onset of Lt knee pain which she feels is from using her cane, so she's still using RW for ambulation.  Will continue to benefit from PT to maximize function.    Personal Factors and Comorbidities Comorbidity 1    Comorbidities HTN    Examination-Activity Limitations Sit;Sleep;Squat;Stairs;Stand;Carry;Bend;Transfers;Dressing;Hygiene/Grooming;Lift;Locomotion Level;Reach Overhead    Examination-Participation Restrictions Laundry;Other   home activity   Stability/Clinical Decision Making Stable/Uncomplicated    Rehab Potential Good    PT Frequency 2x / week    PT Duration --   10 weeks   PT Treatment/Interventions ADLs/Self Care Home Management;Cryotherapy;Electrical Stimulation;Iontophoresis 4mg /ml Dexamethasone;Moist Heat;Balance training;Therapeutic exercise;Therapeutic activities;Functional mobility training;Stair training;Gait training;Ultrasound;Neuromuscular re-education;Patient/family education;Manual techniques;Joint Manipulations;Spinal Manipulations;Passive range of motion;Dry needling;Vasopneumatic Device;Taping    PT Next Visit Plan Continued skilled PT for quad strength, flexion mobility gains and improved  balance control.    PT Home Exercise Plan [id number]    Consulted and Agree with Plan of Care Patient           Patient will benefit from skilled therapeutic intervention in order to improve the following deficits and  impairments:  Abnormal gait,Decreased endurance,Hypomobility,Decreased activity tolerance,Decreased strength,Pain,Increased fascial restricitons,Increased muscle spasms,Difficulty walking,Decreased mobility,Decreased balance,Decreased range of motion,Improper body mechanics,Impaired flexibility,Decreased coordination,Impaired perceived functional ability  Visit Diagnosis: Chronic pain of right knee  Muscle weakness (generalized)  Stiffness of right knee, not elsewhere classified  Difficulty in walking, not elsewhere classified  Localized edema     Problem List Patient Active Problem List   Diagnosis Date Noted  . 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 S/P knee surgery 12/03/2019  . 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 Acute kidney injury (HCC) 03/02/2019  . Atrial fibrillation with RVR (HCC) 02/26/2019  . Acute respiratory failure with hypoxia (HCC) 02/22/2019  . COVID-19 02/22/2019  . Essential hypertension, benign 05/03/2018  . Hypertension 10/24/2017  . Knee osteoarthritis 10/24/2017      Clarita Crane, PT, DPT 02/13/20 1:50 PM    [facility] Physical Therapy [address] [place], [place], [address] Phone: [phone]   Fax:  [phone]  Name: Sugey Trevathan MRN: [id number] Date of Birth: 1950-10-09

## 2020-02-14 ENCOUNTER — Ambulatory Visit (INDEPENDENT_AMBULATORY_CARE_PROVIDER_SITE_OTHER): Payer: Self-pay

## 2020-02-14 ENCOUNTER — Ambulatory Visit (INDEPENDENT_AMBULATORY_CARE_PROVIDER_SITE_OTHER): Payer: Self-pay | Admitting: Surgical

## 2020-02-14 ENCOUNTER — Encounter: Payer: Self-pay | Admitting: Surgical

## 2020-02-14 DIAGNOSIS — Z96651 Presence of right artificial knee joint: Secondary | ICD-10-CM

## 2020-02-14 NOTE — Progress Notes (Signed)
   Post-Op Visit Note   Patient: [name] [name]           Date of Birth: [birth year]           MRN: [id number] Visit Date: 02/14/2020 PCP: Cain Saupe, MD (Inactive)   Assessment & Plan:  Chief Complaint:  Chief Complaint  Patient presents with  . Right Knee - Pain   Visit Diagnoses:  1. Status post total right knee replacement     Plan: Patient is a 70 year old female who presents s/p right total knee arthroplasty on 12/03/2019.  She states that she is doing well overall and is only taking Tylenol for pain control.  She is in physical therapy 2 times per week which she is doing upstairs.  She has 0 degrees of extension and 105 degrees of knee flexion on exam today.  Incisions healing well with no evidence of infection or dehiscence.  She is able to perform straight leg raise.  No significant calf tenderness and negative Homans' sign.  She has good alignment with no significant varus or valgus deformity.  She does complain of swelling in her legs and numbness and tingling lateral to her incision.  She has pitting edema throughout both lower legs.  Recommended she discuss this with her primary doctor and try some support stockings as well.  Overall she seems to be doing well and radiographs taken today show prosthesis in good position and alignment with no evidence of loosening or any significant abnormalities related to alignment.  Recommend that she continue with physical therapy and try and progress from walker to a cane.  Follow-up in 6 weeks for final check with Dr. August Saucer.  Follow-Up Instructions: No follow-ups on file.   Orders:  Orders Placed This Encounter  Procedures  . XR Knee 1-2 Views Right   No orders of the defined types were placed in this encounter.   Imaging: No results found.  PMFS History: Patient Active Problem List   Diagnosis Date Noted  . 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 S/P knee surgery 12/03/2019  . Type 2 diabetes mellitus with obesity  (HCC) 07/22/2019  . Acute kidney injury (HCC) 03/02/2019  . Atrial fibrillation with RVR (HCC) 02/26/2019  . Acute respiratory failure with hypoxia (HCC) 02/22/2019  . COVID-19 02/22/2019  . Essential hypertension, benign 05/03/2018  . Hypertension 10/24/2017  . Knee osteoarthritis 10/24/2017   Past Medical History:  Diagnosis Date  . Acute kidney injury (HCC) 03/02/2019  . Acute respiratory failure with hypoxia (HCC) 02/22/2019  . Atrial fibrillation with RVR (HCC) 02/26/2019  . COVID-19 02/22/2019  . Flu-like symptoms 02/22/2019  . Hypertension   . Knee osteoarthritis 10/24/2017    Family History  Family history unknown: Yes    Past Surgical History:  Procedure Laterality Date  . NO PAST SURGERIES    . TOTAL KNEE ARTHROPLASTY Right 12/03/2019   Procedure: RIGHT TOTAL KNEE ARTHROPLASTY;  Surgeon: Cammy Copa, MD;  Location: [facility] OR;  Service: Orthopedics;  Laterality: Right;   Social History   Occupational History  . Not on file  Tobacco Use  . Smoking status: Never Smoker  . Smokeless tobacco: Never Used  Vaping Use  . Vaping Use: Never used  Substance and Sexual Activity  . Alcohol use: Never  . Drug use: Never  . Sexual activity: Not Currently    Birth control/protection: Post-menopausal

## 2020-02-17 ENCOUNTER — Ambulatory Visit: Payer: Self-pay | Attending: Family Medicine | Admitting: Family Medicine

## 2020-02-17 ENCOUNTER — Other Ambulatory Visit: Payer: Self-pay | Admitting: Family Medicine

## 2020-02-17 ENCOUNTER — Other Ambulatory Visit: Payer: Self-pay

## 2020-02-17 ENCOUNTER — Encounter: Payer: Self-pay | Admitting: Family Medicine

## 2020-02-17 DIAGNOSIS — E119 Type 2 diabetes mellitus without complications: Secondary | ICD-10-CM

## 2020-02-17 DIAGNOSIS — M79604 Pain in right leg: Secondary | ICD-10-CM

## 2020-02-17 DIAGNOSIS — M79605 Pain in left leg: Secondary | ICD-10-CM

## 2020-02-17 DIAGNOSIS — Z96651 Presence of right artificial knee joint: Secondary | ICD-10-CM

## 2020-02-17 MED ORDER — TRAMADOL HCL 50 MG PO TABS: 50.0000 mg | ORAL_TABLET | Freq: Two times a day (BID) | ORAL | 1 refills | Status: AC | PRN

## 2020-02-17 NOTE — Progress Notes (Signed)
Having pain in legs. 

## 2020-02-17 NOTE — Progress Notes (Signed)
Virtual Visit via Telephone Note  I connected with [name] [name], on 02/17/2020 at 2:25 PM by telephone due to the COVID-19 pandemic and verified that I am speaking with the correct person using two identifiers.   Consent: I discussed the limitations, risks, security and privacy concerns of performing an evaluation and management service by telephone and the availability of in person appointments. I also discussed with the patient that there may be a patient responsible charge related to this service. The patient expressed understanding and agreed to proceed.   Location of Patient: Home  Location of Provider: Clinic   Persons participating in Telemedicine visit: Anshu Ayah Cozzolino Farrington-CMA Son - Horton Finer Interpreter ID# [id number] Okey Regal Dr. Alvis Lemmings     History of Present Illness: 70 year old female with a history of Type 2 DM (A1c 6.0), Hypertension,  A.fib with RVR, s/p R TKA in 12/2019 seen for chronic disease management. History is provided by spouse and the patient. She has pain in both legs, R>L all the way from the calf to the hip. Using Tylenol which has not been effective. Currently undergoing PT. She is unable to provide blood glucose readings but denies the preence of hypoglycemia. Her med list indicates she is on Glipizide and her last A1c from 12/2019 was 6.0; spouse is unsure if she takes her Glipizide and tells me to 'ask [place]'. Her care is  coordinated by [place] with [facility] Past Medical History:  Diagnosis Date  . Acute kidney injury (HCC) 03/02/2019  . Acute respiratory failure with hypoxia (HCC) 02/22/2019  . Atrial fibrillation with RVR (HCC) 02/26/2019  . COVID-19 02/22/2019  . Flu-like symptoms 02/22/2019  . Hypertension   . Knee osteoarthritis 10/24/2017   No Known Allergies  Current Outpatient Medications on File Prior to Visit  Medication Sig Dispense Refill  . amLODipine (NORVASC) 2.5 MG tablet Take 2  tablets (5 mg total) by mouth daily. To lower blood pressure 180 tablet 3  . diltiazem (CARDIZEM CD) 120 MG 24 hr capsule Take 1 capsule (120 mg total) by mouth daily. 30 capsule 11  . ELIQUIS 5 MG TABS tablet TAKE 1 TABLET (5 MG TOTAL) BY MOUTH 2 (TWO) TIMES DAILY. 180 tablet 0  . furosemide (LASIX) 20 MG tablet Take one pill daily for 3 days then as needed for swelling 10 tablet 0  . gabapentin (NEURONTIN) 100 MG capsule Take 1 capsule (100 mg total) by mouth 3 (three) times daily. 90 capsule 1  . glipiZIDE (GLUCOTROL) 5 MG tablet Take 0.5 tablets (2.5 mg total) by mouth daily before breakfast. For high blood sugar 30 tablet 6  . loratadine (CLARITIN) 10 MG tablet Take 1 tablet (10 mg total) by mouth daily. 30 tablet 0  . methocarbamol (ROBAXIN) 500 MG tablet Take 1 tablet (500 mg total) by mouth every 8 (eight) hours as needed for muscle spasms. 30 tablet 0  . pantoprazole (PROTONIX) 40 MG tablet Take 1 tablet (40 mg total) by mouth daily. To reduce stomach acid 90 tablet 3  . Psyllium (METAMUCIL PO) Take 1 Scoop by mouth daily.    . rosuvastatin (CRESTOR) 10 MG tablet Take 1 tablet (10 mg total) by mouth daily. To lower cholesterol 90 tablet 1  . triamcinolone cream (KENALOG) 0.1 % APPLY TO DRY SKIN 1-2 TIMES DAILY SPARINGLY (Patient taking differently: Apply 1 application topically See admin instructions. Apply to dry skin 1-2 times daily sparingly) 30 g 2  . oxyCODONE-acetaminophen (PERCOCET) 5-325 MG tablet  Take 1 tablet by mouth every 8 (eight) hours as needed for severe pain. (Patient not taking: Reported on 02/17/2020) 30 tablet 0   No current facility-administered medications on file prior to visit.    Observations/Objective: Awake, alert, oriented x3 Not in acute distress    Lab Results  Component Value Date   HGBA1C 6.0 (H) 12/04/2019    Assessment and Plan: 1. Bilateral leg pain Uncontrolled R TKA could also be contributing - traMADol (ULTRAM) 50 MG tablet; Take 1 tablet  (50 mg total) by mouth every 12 (twelve) hours as needed for up to 5 days.  Dispense: 40 tablet; Refill: 1  2. Controlled type 2 diabetes mellitus without complication, without long-term current use of insulin (HCC) Controlled with A1c of 6.0 I am concerned that she is at risk of hypoglycemia given her age. Glipizide has been discontinued and she will remain on diet control  3. Status post right knee replacement Uncontrolled  Placed on Tramadol Continue with PT and follow up with Ortho  Follow Up Instructions: 2 months   I discussed the assessment and treatment plan with the patient. The patient was provided an opportunity to ask questions and all were answered. The patient agreed with the plan and demonstrated an understanding of the instructions.   The patient was advised to call back or seek an in-person evaluation if the symptoms worsen or if the condition fails to improve as anticipated.     I provided 22 minutes total of non-face-to-face time during this encounter including median intraservice time, reviewing previous notes, investigations, ordering medications, medical decision making, coordinating care and patient verbalized understanding at the end of the visit.     Hoy Register, MD, FAAFP. [facility] [place], [place] [phone]   02/17/2020, 2:25 PM

## 2020-02-18 ENCOUNTER — Encounter: Payer: Self-pay | Admitting: Physical Therapy

## 2020-02-18 ENCOUNTER — Telehealth: Payer: Self-pay | Admitting: Pediatric Intensive Care

## 2020-02-18 NOTE — Telephone Encounter (Signed)
Call to client husband, Jed. ID [email or web address]. Jed states tha there is too much snow and ice for lcient to navigate to car for transportation. Requests CN cancel today's PT appointment. CN advised tha Early Katrinka Blazing will pick up medication at [facility] pharmacy. Shann Medal RN BSN CNP [phone]

## 2020-02-18 NOTE — Telephone Encounter (Signed)
Call to [facility] to cancel client PT appointment for today. Shann Medal RN BSN CNP [phone]

## 2020-02-20 ENCOUNTER — Ambulatory Visit (INDEPENDENT_AMBULATORY_CARE_PROVIDER_SITE_OTHER): Payer: Self-pay | Admitting: Rehabilitative and Restorative Service Providers"

## 2020-02-20 ENCOUNTER — Other Ambulatory Visit: Payer: Self-pay

## 2020-02-20 ENCOUNTER — Encounter: Payer: Self-pay | Admitting: Rehabilitative and Restorative Service Providers"

## 2020-02-20 DIAGNOSIS — G8929 Other chronic pain: Secondary | ICD-10-CM

## 2020-02-20 DIAGNOSIS — M25561 Pain in right knee: Secondary | ICD-10-CM

## 2020-02-20 DIAGNOSIS — M6281 Muscle weakness (generalized): Secondary | ICD-10-CM

## 2020-02-20 DIAGNOSIS — R600 Localized edema: Secondary | ICD-10-CM

## 2020-02-20 DIAGNOSIS — R262 Difficulty in walking, not elsewhere classified: Secondary | ICD-10-CM

## 2020-02-20 DIAGNOSIS — M25661 Stiffness of right knee, not elsewhere classified: Secondary | ICD-10-CM

## 2020-02-20 NOTE — Therapy (Signed)
[facility] Physical Therapy [address] [place], [place], [address] Phone: [phone]   Fax:  [phone]  Physical Therapy Treatment  Patient Details  Name: [name] [name] MRN: [id number] Date of Birth: 03-25-1950 Referring Provider (PT): Dr. August Saucer   Encounter Date: 02/20/2020   PT End of Session - 02/20/20 1257    Visit Number 14    Number of Visits 20   adjusted to reflect frequency/duration of POC   Date for PT Re-Evaluation 03/03/20    Progress Note Due on Visit 20    PT Start Time 1258    PT Stop Time 1343    PT Time Calculation (min) 45 min    Activity Tolerance Patient tolerated treatment well    Behavior During Therapy [facility] for tasks assessed/performed           Past Medical History:  Diagnosis Date  . Acute kidney injury (HCC) 03/02/2019  . Acute respiratory failure with hypoxia (HCC) 02/22/2019  . Atrial fibrillation with RVR (HCC) 02/26/2019  . COVID-19 02/22/2019  . Flu-like symptoms 02/22/2019  . Hypertension   . Knee osteoarthritis 10/24/2017    Past Surgical History:  Procedure Laterality Date  . NO PAST SURGERIES    . TOTAL KNEE ARTHROPLASTY Right 12/03/2019   Procedure: RIGHT TOTAL KNEE ARTHROPLASTY;  Surgeon: Cammy Copa, MD;  Location: [facility] OR;  Service: Orthopedics;  Laterality: Right;    There were no vitals filed for this visit.   Subjective Assessment - 02/20/20 1310    Subjective Pt. indicated feeling knee similar overall.  Pt. stated Lt knee hurting as well.  Wanted walker taller.    Patient is accompained by: Interpreter    Limitations Sitting;Standing;Walking    Patient Stated Goals improve mobility    Currently in Pain? Yes    Pain Score --   moderate   Pain Location Knee    Pain Orientation Left;Right    Pain Descriptors / Indicators Aching;Tightness    Pain Type Surgical pain    Pain Onset 1 to 4 weeks ago    Pain Frequency Intermittent    Pain Onset In the past 7 days                              [facility] Adult PT Treatment/Exercise - 02/20/20 0001      Neuro Re-ed    Neuro Re-ed Details  in // bars: tandem ambulation fwd 10 ft x 6 occasional HHA, alt toe tapping 4 inch step x 20 each LE      Knee/Hip Exercises: Aerobic   Recumbent Bike seat 6 full revolution      Knee/Hip Exercises: Machines for Strengthening   Cybex Leg Press 50 lbs SL Rt 3 x 10      Knee/Hip Exercises: Standing   Lateral Step Up 10 reps;Step Height: 4";Hand Hold: 1;Right    Forward Step Up 5 reps;Right;Step Height: 4"      Knee/Hip Exercises: Seated   Sit to Sand 5 reps;without UE support   18 inch chair                 PT Education - 02/20/20 1336    Education Details Education on importance on walker use for safety or cane use instead of independent ambulation.    Person(s) Educated Patient    Methods Explanation    Comprehension Verbalized understanding  PT Long Term Goals - 02/11/20 1352      PT LONG TERM GOAL #1   Title Patient will demonstrate/report pain at worst less than or equal to 2/10 to facilitate minimal limitation in daily activity secondary to pain symptoms.    Time 10    Period Weeks    Status On-going    Target Date 03/03/20      PT LONG TERM GOAL #2   Title Patient will demonstrate independent use of home exercise program to facilitate ability to maintain/progress functional gains from skilled physical therapy services.    Time 10    Period Weeks    Status On-going    Target Date 03/03/20      PT LONG TERM GOAL #3   Title Patient will demonstrate independent ambulation community distances > 300 ft to facilitate community integration at [facility].    Time 10    Period Weeks    Status On-going    Target Date 03/03/20      PT LONG TERM GOAL #4   Title Pt. will demonstrate Rt knee AORM 0-110 deg to facilitate ability to perform transfers, walking, stairs at PLOF s limitation.    Time 10    Period Weeks    Status  On-going    Target Date 03/03/20      PT LONG TERM GOAL #5   Title Pt. will demonstrate Rt knee MMT 5/5 throughout to facilitate independent ambulation, stair navigation, transfers at PLOF.    Time 10    Period Weeks    Status On-going    Target Date 03/03/20      PT LONG TERM GOAL #6   Title Pt. will demonstrate FOTO 59% outcome.    Time 10    Period Weeks    Status On-going    Target Date 03/03/20                 Plan - 02/20/20 1337    Clinical Impression Statement End range flexion discomfort/pain noted throughout intervention today but progressing overall.  Fair tolerance to standing WB movement coordination intervention.  Continued strengthening and balance control improvements to help improve functional mobility.    Personal Factors and Comorbidities Comorbidity 1    Comorbidities HTN    Examination-Activity Limitations Sit;Sleep;Squat;Stairs;Stand;Carry;Bend;Transfers;Dressing;Hygiene/Grooming;Lift;Locomotion Level;Reach Overhead    Examination-Participation Restrictions Laundry;Other   home activity   Stability/Clinical Decision Making Stable/Uncomplicated    Rehab Potential Good    PT Frequency 2x / week    PT Duration --   10 weeks   PT Treatment/Interventions ADLs/Self Care Home Management;Cryotherapy;Electrical Stimulation;Iontophoresis 4mg /ml Dexamethasone;Moist Heat;Balance training;Therapeutic exercise;Therapeutic activities;Functional mobility training;Stair training;Gait training;Ultrasound;Neuromuscular re-education;Patient/family education;Manual techniques;Joint Manipulations;Spinal Manipulations;Passive range of motion;Dry needling;Vasopneumatic Device;Taping    PT Next Visit Plan Continued skilled PT for quad strength, flexion mobility gains and improved balance control.    PT Home Exercise Plan [id number]    Consulted and Agree with Plan of Care Patient           Patient will benefit from skilled therapeutic intervention in order to improve the  following deficits and impairments:  Abnormal gait,Decreased endurance,Hypomobility,Decreased activity tolerance,Decreased strength,Pain,Increased fascial restricitons,Increased muscle spasms,Difficulty walking,Decreased mobility,Decreased balance,Decreased range of motion,Improper body mechanics,Impaired flexibility,Decreased coordination,Impaired perceived functional ability  Visit Diagnosis: Chronic pain of right knee  Muscle weakness (generalized)  Stiffness of right knee, not elsewhere classified  Difficulty in walking, not elsewhere classified  Localized edema     Problem List Patient Active Problem List   Diagnosis Date Noted  .  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 S/P knee surgery 12/03/2019  . 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 Acute kidney injury (HCC) 03/02/2019  . Atrial fibrillation with RVR (HCC) 02/26/2019  . Acute respiratory failure with hypoxia (HCC) 02/22/2019  . COVID-19 02/22/2019  . Essential hypertension, benign 05/03/2018  . Hypertension 10/24/2017  . Knee osteoarthritis 10/24/2017    Chyrel Masson, PT, DPT, OCS, ATC 02/20/20  1:45 PM    [facility] Physical Therapy [address] [place], [place], [address] Phone: [phone]   Fax:  [phone]  Name: [name] [name] MRN: [id number] Date of Birth: 1950/12/29

## 2020-02-24 ENCOUNTER — Encounter: Payer: Self-pay | Admitting: Physical Therapy

## 2020-02-26 ENCOUNTER — Encounter: Payer: Self-pay | Admitting: Physical Therapy

## 2020-02-26 ENCOUNTER — Other Ambulatory Visit: Payer: Self-pay

## 2020-02-26 ENCOUNTER — Ambulatory Visit (INDEPENDENT_AMBULATORY_CARE_PROVIDER_SITE_OTHER): Payer: Self-pay | Admitting: Physical Therapy

## 2020-02-26 DIAGNOSIS — M6281 Muscle weakness (generalized): Secondary | ICD-10-CM

## 2020-02-26 DIAGNOSIS — M25661 Stiffness of right knee, not elsewhere classified: Secondary | ICD-10-CM

## 2020-02-26 DIAGNOSIS — M25561 Pain in right knee: Secondary | ICD-10-CM

## 2020-02-26 DIAGNOSIS — R262 Difficulty in walking, not elsewhere classified: Secondary | ICD-10-CM

## 2020-02-26 DIAGNOSIS — R600 Localized edema: Secondary | ICD-10-CM

## 2020-02-26 DIAGNOSIS — M25562 Pain in left knee: Secondary | ICD-10-CM

## 2020-02-26 DIAGNOSIS — G8929 Other chronic pain: Secondary | ICD-10-CM

## 2020-02-26 NOTE — Therapy (Signed)
[facility] Physical Therapy [address] [place], [place], [address] Phone: [phone]   Fax:  [phone]  Physical Therapy Treatment  Patient Details  Name: [name] [name] MRN: [id number] Date of Birth: [birth year] Referring Provider (PT): Dr. August Saucer   Encounter Date: 02/26/2020   PT End of Session - 02/26/20 1249    Visit Number 15    Number of Visits 20   adjusted to reflect frequency/duration of POC   Date for PT Re-Evaluation 03/03/20    Progress Note Due on Visit 20    PT Start Time 1140    PT Stop Time 1223    PT Time Calculation (min) 43 min    Activity Tolerance Patient tolerated treatment well    Behavior During Therapy [facility] for tasks assessed/performed           Past Medical History:  Diagnosis Date  . Acute kidney injury (HCC) 03/02/2019  . Acute respiratory failure with hypoxia (HCC) 02/22/2019  . Atrial fibrillation with RVR (HCC) 02/26/2019  . COVID-19 02/22/2019  . Flu-like symptoms 02/22/2019  . Hypertension   . Knee osteoarthritis 10/24/2017    Past Surgical History:  Procedure Laterality Date  . NO PAST SURGERIES    . TOTAL KNEE ARTHROPLASTY Right 12/03/2019   Procedure: RIGHT TOTAL KNEE ARTHROPLASTY;  Surgeon: Cammy Copa, MD;  Location: [facility] OR;  Service: Orthopedics;  Laterality: Right;    There were no vitals filed for this visit.   Subjective Assessment - 02/26/20 1145    Subjective knee hurts more when trying to bend the knee    Patient is accompained by: Interpreter    Limitations Sitting;Standing;Walking    Patient Stated Goals improve mobility    Currently in Pain? Yes    Pain Score --   moderate   Pain Onset 1 to 4 weeks ago    Pain Onset In the past 7 days                             [facility] Adult PT Treatment/Exercise - 02/26/20 1147      Ambulation/Gait   Ambulation/Gait Assistance 4: Min guard    Ambulation Distance (Feet) 100 Feet    Assistive device Straight cane    Gait  Comments Lt knee main limiting factor for transitioning safely to [facility] or no AD at this time, Rt kneee doing well. One episode of Lt knee buckling at end of amb with [facility]      Knee/Hip Exercises: Aerobic   Recumbent Bike seat 7 full revolutions x 8 min; L2      Knee/Hip Exercises: Standing   Heel Raises Both;20 reps    Knee Flexion Both;20 reps    Hip Abduction Both;20 reps    Hip Extension Both;20 reps                  PT Education - 02/26/20 1249    Education Details extensive discussion on goals, POC and PT going forward    Person(s) Educated Patient    Methods Explanation    Comprehension Verbalized understanding               PT Long Term Goals - 02/11/20 1352      PT LONG TERM GOAL #1   Title Patient will demonstrate/report pain at worst less than or equal to 2/10 to facilitate minimal limitation in daily activity secondary to pain symptoms.    Time 10  Period Weeks    Status On-going    Target Date 03/03/20      PT LONG TERM GOAL #2   Title Patient will demonstrate independent use of home exercise program to facilitate ability to maintain/progress functional gains from skilled physical therapy services.    Time 10    Period Weeks    Status On-going    Target Date 03/03/20      PT LONG TERM GOAL #3   Title Patient will demonstrate independent ambulation community distances > 300 ft to facilitate community integration at [facility].    Time 10    Period Weeks    Status On-going    Target Date 03/03/20      PT LONG TERM GOAL #4   Title Pt. will demonstrate Rt knee AORM 0-110 deg to facilitate ability to perform transfers, walking, stairs at PLOF s limitation.    Time 10    Period Weeks    Status On-going    Target Date 03/03/20      PT LONG TERM GOAL #5   Title Pt. will demonstrate Rt knee MMT 5/5 throughout to facilitate independent ambulation, stair navigation, transfers at PLOF.    Time 10    Period Weeks    Status On-going    Target Date 03/03/20       PT LONG TERM GOAL #6   Title Pt. will demonstrate FOTO 59% outcome.    Time 10    Period Weeks    Status On-going    Target Date 03/03/20                 Plan - 02/26/20 1249    Clinical Impression Statement Pt overall doing well from Rt TKA standpoint, and main limiting factor is Lt knee pain at this time. Lt knee with known OA and will limit ability to transition safely from RW.  Due for recert next week if indicated.    Personal Factors and Comorbidities Comorbidity 1    Comorbidities HTN    Examination-Activity Limitations Sit;Sleep;Squat;Stairs;Stand;Carry;Bend;Transfers;Dressing;Hygiene/Grooming;Lift;Locomotion Level;Reach Overhead    Examination-Participation Restrictions Laundry;Other   home activity   Stability/Clinical Decision Making Stable/Uncomplicated    Rehab Potential Good    PT Frequency 2x / week    PT Duration --   10 weeks   PT Treatment/Interventions ADLs/Self Care Home Management;Cryotherapy;Electrical Stimulation;Iontophoresis 4mg /ml Dexamethasone;Moist Heat;Balance training;Therapeutic exercise;Therapeutic activities;Functional mobility training;Stair training;Gait training;Ultrasound;Neuromuscular re-education;Patient/family education;Manual techniques;Joint Manipulations;Spinal Manipulations;Passive range of motion;Dry needling;Vasopneumatic Device;Taping    PT Next Visit Plan Continued skilled PT for quad strength, flexion mobility gains and improved balance control.    PT Home Exercise Plan [id number]    Consulted and Agree with Plan of Care Patient           Patient will benefit from skilled therapeutic intervention in order to improve the following deficits and impairments:  Abnormal gait,Decreased endurance,Hypomobility,Decreased activity tolerance,Decreased strength,Pain,Increased fascial restricitons,Increased muscle spasms,Difficulty walking,Decreased mobility,Decreased balance,Decreased range of motion,Improper body mechanics,Impaired  flexibility,Decreased coordination,Impaired perceived functional ability  Visit Diagnosis: Chronic pain of right knee  Muscle weakness (generalized)  Stiffness of right knee, not elsewhere classified  Difficulty in walking, not elsewhere classified  Localized edema  Chronic pain of left knee     Problem List Patient Active Problem List   Diagnosis Date Noted  . 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 S/P knee surgery 12/03/2019  . 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 Acute kidney injury (HCC) 03/02/2019  . Atrial fibrillation with RVR (HCC)  02/26/2019  . Acute respiratory failure with hypoxia (HCC) 02/22/2019  . COVID-19 02/22/2019  . Essential hypertension, benign 05/03/2018  . Hypertension 10/24/2017  . Knee osteoarthritis 10/24/2017      Clarita Crane, PT, DPT 02/26/20 12:51 PM    [facility] Physical Therapy [address] [place], [place], [address] Phone: [phone]   Fax:  [phone]  Name: [name] [name] MRN: [id number] Date of Birth: 03-06-1950

## 2020-02-28 ENCOUNTER — Telehealth: Payer: Self-pay | Admitting: Pediatric Intensive Care

## 2020-02-28 NOTE — Telephone Encounter (Signed)
Call to transportation services to arrange rides to PT for 2/1 and 2/3. Text message to spouse, Luciana Axe, for ETA. Shann Medal RN BSN CNP [phone]

## 2020-03-03 ENCOUNTER — Other Ambulatory Visit: Payer: Self-pay

## 2020-03-03 ENCOUNTER — Ambulatory Visit (INDEPENDENT_AMBULATORY_CARE_PROVIDER_SITE_OTHER): Payer: Self-pay | Admitting: Physical Therapy

## 2020-03-03 ENCOUNTER — Encounter: Payer: Self-pay | Admitting: Physical Therapy

## 2020-03-03 DIAGNOSIS — M6281 Muscle weakness (generalized): Secondary | ICD-10-CM

## 2020-03-03 DIAGNOSIS — M25661 Stiffness of right knee, not elsewhere classified: Secondary | ICD-10-CM

## 2020-03-03 DIAGNOSIS — G8929 Other chronic pain: Secondary | ICD-10-CM

## 2020-03-03 DIAGNOSIS — R600 Localized edema: Secondary | ICD-10-CM

## 2020-03-03 DIAGNOSIS — M25561 Pain in right knee: Secondary | ICD-10-CM

## 2020-03-03 DIAGNOSIS — R262 Difficulty in walking, not elsewhere classified: Secondary | ICD-10-CM

## 2020-03-03 DIAGNOSIS — M25562 Pain in left knee: Secondary | ICD-10-CM

## 2020-03-03 NOTE — Therapy (Signed)
[facility] Physical Therapy [address] [place], [place], [address] Phone: [phone]   Fax:  [phone]  Physical Therapy Treatment  Patient Details  Name: [name] [name] MRN: [id number] Date of Birth: [birth year] Referring Provider (PT): Dr. August Saucer   Encounter Date: 03/03/2020   PT End of Session - 03/03/20 1138    Visit Number 16    Number of Visits 20   adjusted to reflect frequency/duration of POC   Date for PT Re-Evaluation 03/03/20    Progress Note Due on Visit 20    PT Start Time 1100    PT Stop Time 1142    PT Time Calculation (min) 42 min    Activity Tolerance Patient tolerated treatment well    Behavior During Therapy [facility] for tasks assessed/performed           Past Medical History:  Diagnosis Date  . Acute kidney injury (HCC) 03/02/2019  . Acute respiratory failure with hypoxia (HCC) 02/22/2019  . Atrial fibrillation with RVR (HCC) 02/26/2019  . COVID-19 02/22/2019  . Flu-like symptoms 02/22/2019  . Hypertension   . Knee osteoarthritis 10/24/2017    Past Surgical History:  Procedure Laterality Date  . NO PAST SURGERIES    . TOTAL KNEE ARTHROPLASTY Right 12/03/2019   Procedure: RIGHT TOTAL KNEE ARTHROPLASTY;  Surgeon: Cammy Copa, MD;  Location: [facility] OR;  Service: Orthopedics;  Laterality: Right;    There were no vitals filed for this visit.   Subjective Assessment - 03/03/20 1104    Subjective Lt knee hurts worse than the Rt knee.    Patient is accompained by: Interpreter    Limitations Sitting;Standing;Walking    Patient Stated Goals improve mobility    Currently in Pain? No/denies   Lt knee 8/10   Pain Onset 1 to 4 weeks ago    Pain Onset In the past 7 days                             [facility] Adult PT Treatment/Exercise - 03/03/20 1107      Ambulation/Gait   Stairs Yes    Stairs Assistance 4: Min guard;5: Supervision    Stairs Assistance Details (indicate cue type and reason) min cues for  sequencing/technique/safety    Stair Management Technique One rail Right;Alternating pattern;Step to pattern;Forwards;With crutches   forearm crutch and railing   Number of Stairs 25    Height of Stairs 6      Knee/Hip Exercises: Aerobic   Recumbent Bike seat 6 partial to full  revolutions x 8 min      Knee/Hip Exercises: Machines for Strengthening   Cybex Leg Press 75# 3x10 bil push                       PT Long Term Goals - 02/11/20 1352      PT LONG TERM GOAL #1   Title Patient will demonstrate/report pain at worst less than or equal to 2/10 to facilitate minimal limitation in daily activity secondary to pain symptoms.    Time 10    Period Weeks    Status On-going    Target Date 03/03/20      PT LONG TERM GOAL #2   Title Patient will demonstrate independent use of home exercise program to facilitate ability to maintain/progress functional gains from skilled physical therapy services.    Time 10    Period Weeks    Status On-going  Target Date 03/03/20      PT LONG TERM GOAL #3   Title Patient will demonstrate independent ambulation community distances > 300 ft to facilitate community integration at [facility].    Time 10    Period Weeks    Status On-going    Target Date 03/03/20      PT LONG TERM GOAL #4   Title Pt. will demonstrate Rt knee AORM 0-110 deg to facilitate ability to perform transfers, walking, stairs at PLOF s limitation.    Time 10    Period Weeks    Status On-going    Target Date 03/03/20      PT LONG TERM GOAL #5   Title Pt. will demonstrate Rt knee MMT 5/5 throughout to facilitate independent ambulation, stair navigation, transfers at PLOF.    Time 10    Period Weeks    Status On-going    Target Date 03/03/20      PT LONG TERM GOAL #6   Title Pt. will demonstrate FOTO 59% outcome.    Time 10    Period Weeks    Status On-going    Target Date 03/03/20                 Plan - 03/03/20 1138    Clinical Impression Statement Pt  requesting to work on stairs today, and overall supervision for stair negotiation and forearm crutch.  Needed education for sequencing and that at this time she can ascend/descend with either leg as long as she is comfortable.  At this time Lt knee is biggest limiting factor and anticipate likely additional intervention needed for end stage OA.  Plan for d/c next visit.    Personal Factors and Comorbidities Comorbidity 1    Comorbidities HTN    Examination-Activity Limitations Sit;Sleep;Squat;Stairs;Stand;Carry;Bend;Transfers;Dressing;Hygiene/Grooming;Lift;Locomotion Level;Reach Overhead    Examination-Participation Restrictions Laundry;Other   home activity   Stability/Clinical Decision Making Stable/Uncomplicated    Rehab Potential Good    PT Frequency 2x / week    PT Duration --   10 weeks   PT Treatment/Interventions ADLs/Self Care Home Management;Cryotherapy;Electrical Stimulation;Iontophoresis 4mg /ml Dexamethasone;Moist Heat;Balance training;Therapeutic exercise;Therapeutic activities;Functional mobility training;Stair training;Gait training;Ultrasound;Neuromuscular re-education;Patient/family education;Manual techniques;Joint Manipulations;Spinal Manipulations;Passive range of motion;Dry needling;Vasopneumatic Device;Taping    PT Next Visit Plan check goals, plan for d/c    PT Home Exercise Plan [id number]    Consulted and Agree with Plan of Care Patient           Patient will benefit from skilled therapeutic intervention in order to improve the following deficits and impairments:  Abnormal gait,Decreased endurance,Hypomobility,Decreased activity tolerance,Decreased strength,Pain,Increased fascial restricitons,Increased muscle spasms,Difficulty walking,Decreased mobility,Decreased balance,Decreased range of motion,Improper body mechanics,Impaired flexibility,Decreased coordination,Impaired perceived functional ability  Visit Diagnosis: Chronic pain of right knee  Muscle weakness  (generalized)  Stiffness of right knee, not elsewhere classified  Difficulty in walking, not elsewhere classified  Localized edema  Chronic pain of left knee     Problem List Patient Active Problem List   Diagnosis Date Noted  . 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 S/P knee surgery 12/03/2019  . 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 Acute kidney injury (HCC) 03/02/2019  . Atrial fibrillation with RVR (HCC) 02/26/2019  . Acute respiratory failure with hypoxia (HCC) 02/22/2019  . COVID-19 02/22/2019  . Essential hypertension, benign 05/03/2018  . Hypertension 10/24/2017  . Knee osteoarthritis 10/24/2017      10/26/2017, PT, DPT 03/03/20 11:41 AM    [facility] Physical Therapy  [address] [place], [place], [address] Phone: [phone]   Fax:  [phone]  Name: [name] [name] MRN: [id number] Date of Birth: Jul 13, 1950

## 2020-03-05 ENCOUNTER — Other Ambulatory Visit: Payer: Self-pay

## 2020-03-05 ENCOUNTER — Telehealth: Payer: Self-pay | Admitting: Physical Therapy

## 2020-03-05 ENCOUNTER — Encounter: Payer: Self-pay | Admitting: Physical Therapy

## 2020-03-05 ENCOUNTER — Ambulatory Visit (INDEPENDENT_AMBULATORY_CARE_PROVIDER_SITE_OTHER): Payer: Self-pay | Admitting: Physical Therapy

## 2020-03-05 DIAGNOSIS — G8929 Other chronic pain: Secondary | ICD-10-CM

## 2020-03-05 DIAGNOSIS — M25562 Pain in left knee: Secondary | ICD-10-CM

## 2020-03-05 DIAGNOSIS — M25661 Stiffness of right knee, not elsewhere classified: Secondary | ICD-10-CM

## 2020-03-05 DIAGNOSIS — M25561 Pain in right knee: Secondary | ICD-10-CM

## 2020-03-05 DIAGNOSIS — M6281 Muscle weakness (generalized): Secondary | ICD-10-CM

## 2020-03-05 DIAGNOSIS — R262 Difficulty in walking, not elsewhere classified: Secondary | ICD-10-CM

## 2020-03-05 DIAGNOSIS — E6609 Other obesity due to excess calories: Secondary | ICD-10-CM

## 2020-03-05 DIAGNOSIS — R600 Localized edema: Secondary | ICD-10-CM

## 2020-03-05 NOTE — Telephone Encounter (Signed)
I have placed the referral

## 2020-03-05 NOTE — Telephone Encounter (Signed)
Dr. Alvis Lemmings- We have been seeing [name] [name] for PT following her TKA, and she's done excellent.  She is very interested in weight loss and is unsure where to start.  I gave her the information for [facility], but since she doesn't speak English she is concerned about how to communicate if she initiates the call.    Can you make the referral to [facility] so they can reach out directly to her with a phone interpreter?  Thanks! Clarita Crane, PT, DPT 03/05/20 12:18 PM

## 2020-03-05 NOTE — Therapy (Addendum)
[facility] Physical Therapy [address] [place], [place], [address] Phone: [phone]   Fax:  [phone]  Physical Therapy Treatment/Discharge  Patient Details  Name: [name] [name] MRN: [id number] Date of Birth: 1950-04-11 Referring Provider (PT): Dr. Marlou Sa   Encounter Date: 03/05/2020   PT End of Session - 03/05/20 1138    Visit Number 17    Number of Visits --   adjusted to reflect frequency/duration of POC   Progress Note Due on Visit 20    PT Start Time 1145    PT Stop Time 1225    PT Time Calculation (min) 40 min    Activity Tolerance Patient tolerated treatment well    Behavior During Therapy [facility] for tasks assessed/performed           Past Medical History:  Diagnosis Date  . Acute kidney injury ([place]) 03/02/2019  . Acute respiratory failure with hypoxia ([place]) 02/22/2019  . Atrial fibrillation with RVR ([place]) 02/26/2019  . COVID-19 02/22/2019  . Flu-like symptoms 02/22/2019  . Hypertension   . Knee osteoarthritis 10/24/2017    Past Surgical History:  Procedure Laterality Date  . NO PAST SURGERIES    . TOTAL KNEE ARTHROPLASTY Right 12/03/2019   Procedure: RIGHT TOTAL KNEE ARTHROPLASTY;  Surgeon: Meredith Pel, MD;  Location: [place];  Service: Orthopedics;  Laterality: Right;    There were no vitals filed for this visit.   Subjective Assessment - 03/05/20 1137    Subjective Rt knee is doing well, Lt knee still causing more pain.  Rates Rt knee pain as 5/10 - most of the time she has no pain    Patient is accompained by: Interpreter    Limitations Sitting;Standing;Walking    Patient Stated Goals improve mobility    Currently in Pain? Yes    Pain Score 5     Pain Location Knee    Pain Orientation Right    Pain Descriptors / Indicators Aching;Tightness    Pain Type Acute pain;Surgical pain    Pain Onset 1 to 4 weeks ago    Pain Frequency Intermittent    Aggravating Factors  standing/walking for longer periods    Pain Relieving  Factors medicine    Pain Onset --              [facility] [place] [facility] PT Assessment - 03/05/20 1159      Assessment   Medical Diagnosis Rt TKA 12/03/2019    Referring Provider (PT) Dr. Marlou Sa      Observation/Other Assessments   Focus on Therapeutic Outcomes (FOTO)  53   increased time due to proxy for interpretation     AROM   Right Knee Extension 0    Right Knee Flexion 110      Strength   Right Knee Flexion 5/5    Right Knee Extension 5/5                         [facility] Adult PT Treatment/Exercise - 03/05/20 1149      Knee/Hip Exercises: Aerobic   Recumbent Bike seat 7 x 8 min      Knee/Hip Exercises: Standing   Heel Raises Both;20 reps    Knee Flexion Both;20 reps    Hip Abduction Both;20 reps    Hip Extension Both;20 reps                       PT Long Term Goals - 03/05/20 1200  PT LONG TERM GOAL #1   Title Patient will demonstrate/report pain at worst less than or equal to 2/10 to facilitate minimal limitation in daily activity secondary to pain symptoms.    Time 10    Period Weeks    Status Achieved      PT LONG TERM GOAL #2   Title Patient will demonstrate independent use of home exercise program to facilitate ability to maintain/progress functional gains from skilled physical therapy services.    Time 10    Period Weeks    Status Achieved      PT LONG TERM GOAL #3   Title Patient will demonstrate independent ambulation community distances > 300 ft to facilitate community integration at [facility].    Baseline 2/3: has to use RW due to Lt knee pain/instability; would be met with Rt knee    Time 10    Period Weeks    Status Partially Met      PT LONG TERM GOAL #4   Title Pt. will demonstrate Rt knee AORM 0-110 deg to facilitate ability to perform transfers, walking, stairs at PLOF s limitation.    Time 10    Period Weeks    Status Achieved      PT LONG TERM GOAL #5   Title Pt. will demonstrate Rt knee MMT 5/5 throughout to facilitate  independent ambulation, stair navigation, transfers at PLOF.    Time 10    Period Weeks    Status Achieved      PT LONG TERM GOAL #6   Title Pt. will demonstrate FOTO 59% outcome.    Baseline 2/3: 57%; improved 21%, missed goal by 2%    Time 10    Period Weeks    Status Partially Met                 Plan - 03/05/20 1227    Clinical Impression Statement Pt has met/partially met all LTGs and has recovered well from Rt TKA.  At this time her Lt knee is the main limiting factor and she has known OA in that knee so anticipate she may need further intervention in the future.  Will d/c PT today.    Personal Factors and Comorbidities Comorbidity 1    Comorbidities HTN    Examination-Activity Limitations Sit;Sleep;Squat;Stairs;Stand;Carry;Bend;Transfers;Dressing;Hygiene/Grooming;Lift;Locomotion Level;Reach Overhead    Examination-Participation Restrictions Laundry;Other   home activity   Stability/Clinical Decision Making Stable/Uncomplicated    Rehab Potential Good    PT Frequency 2x / week    PT Duration --   10 weeks   PT Treatment/Interventions ADLs/Self Care Home Management;Cryotherapy;Electrical Stimulation;Iontophoresis 4mg /ml Dexamethasone;Moist Heat;Balance training;Therapeutic exercise;Therapeutic activities;Functional mobility training;Stair training;Gait training;Ultrasound;Neuromuscular re-education;Patient/family education;Manual techniques;Joint Manipulations;Spinal Manipulations;Passive range of motion;Dry needling;Vasopneumatic Device;Taping    PT Next Visit Plan d/c PT today    PT Home Exercise Plan [id number]    Consulted and Agree with Plan of Care Patient           Patient will benefit from skilled therapeutic intervention in order to improve the following deficits and impairments:  Abnormal gait,Decreased endurance,Hypomobility,Decreased activity tolerance,Decreased strength,Pain,Increased fascial restricitons,Increased muscle spasms,Difficulty walking,Decreased  mobility,Decreased balance,Decreased range of motion,Improper body mechanics,Impaired flexibility,Decreased coordination,Impaired perceived functional ability  Visit Diagnosis: Chronic pain of right knee  Muscle weakness (generalized)  Stiffness of right knee, not elsewhere classified  Difficulty in walking, not elsewhere classified  Localized edema  Chronic pain of left knee     Problem List Patient Active Problem List   Diagnosis Date Noted  . S/P  total knee arthroplasty 12/04/2019  . S/P knee surgery 12/03/2019  . Type 2 diabetes mellitus with obesity ([place]) 07/22/2019  . Acute kidney injury ([place]) 03/02/2019  . Atrial fibrillation with RVR ([place]) 02/26/2019  . Acute respiratory failure with hypoxia ([place]) 02/22/2019  . COVID-19 02/22/2019  . Essential hypertension, benign 05/03/2018  . Hypertension 10/24/2017  . Knee osteoarthritis 10/24/2017      Laureen Abrahams, PT, DPT 03/05/20 12:29 PM   [facility] Physical Therapy [address] [place], [place], [address] Phone: [phone]   Fax:  [phone]  Name: [name] [name] MRN: [id number] Date of Birth: [birth year]     PHYSICAL THERAPY DISCHARGE SUMMARY  Visits from Start of Care: 17  Current functional level related to goals / functional outcomes: See above; Lt knee is biggest limitation at this time.  Rt knee doing excellent   Remaining deficits: See above   Education / Equipment: HEP  Plan: Patient agrees to discharge.  Patient goals were partially met. Patient is being discharged due to being pleased with the current functional level.  ?????     Laureen Abrahams, PT, DPT 03/05/20 12:29 PM   addend to title document as discharge.  Scot Jun, PT, DPT, OCS, ATC 04/10/20  9:58 AM     [facility] Physical Therapy [address] [place], [place], [address] Phone: [phone]   Fax:  [phone]

## 2020-03-19 ENCOUNTER — Other Ambulatory Visit: Payer: Self-pay | Admitting: Family Medicine

## 2020-03-19 ENCOUNTER — Encounter: Payer: Self-pay | Admitting: Pediatric Intensive Care

## 2020-03-19 DIAGNOSIS — I10 Essential (primary) hypertension: Secondary | ICD-10-CM

## 2020-03-19 NOTE — Telephone Encounter (Signed)
Requested Prescriptions  Pending Prescriptions Disp Refills  . amLODipine (NORVASC) 2.5 MG tablet [Pharmacy Med Name: AMLODIPINE 2.5 MG TABLET 2.5 Tablet] 60 tablet 3    Sig: TAKE 2 TABLETS (5 MG TOTAL) BY MOUTH DAILY. TO LOWER BLOOD PRESSURE     Cardiovascular:  Calcium Channel Blockers Passed - 03/19/2020 10:11 AM      Passed - Last BP in normal range    BP Readings from Last 1 Encounters:  12/16/19 129/75         Passed - Valid encounter within last 6 months    Recent Outpatient Visits          1 month ago Bilateral leg pain   [facility] [place], Odette Horns, MD   3 months ago Need for immunization against influenza   [facility] [place], Stanley, MD   5 months ago Preoperative evaluation to rule out surgical contraindication   [facility] [place], Dixon, MD   8 months ago Type 2 diabetes mellitus with obesity [facility])   [facility] Jonah Blue B, MD   10 months ago Type 2 diabetes mellitus with hyperglycemia, unspecified whether long term insulin use [facility])   [facility] [place], Marzella Schlein, [place]      Future Appointments            In 6 days August Saucer, Corrie Mckusick, MD [facility] [place]   In 4 weeks Hoy Register, MD [facility]

## 2020-03-19 NOTE — Congregational Nurse Program (Signed)
  Dept: [phone]   Congregational Nurse Program Note  Date of Encounter: 03/19/2020  Past Medical History: Past Medical History:  Diagnosis Date  . Acute kidney injury (H[facility]) 03/02/2019  . Acute respiratory failure with hypoxia (H[facility]) 02/22/2019  . Atrial fibrillation with RVR (H[facility]) 02/26/2019  . COVID-19 02/22/2019  . Flu-like symptoms 02/22/2019  . Hypertension   . Knee osteoarthritis 10/24/2017    Encounter Details:Home visit to drop off medication. CN will follow up with PCP regarding other prescriptions. Shann Medal RN BSN CNP [phone]

## 2020-03-25 ENCOUNTER — Other Ambulatory Visit: Payer: Self-pay | Admitting: Critical Care Medicine

## 2020-03-25 ENCOUNTER — Ambulatory Visit: Payer: Self-pay | Attending: Critical Care Medicine | Admitting: Critical Care Medicine

## 2020-03-25 ENCOUNTER — Ambulatory Visit: Payer: Self-pay | Admitting: *Deleted

## 2020-03-25 ENCOUNTER — Ambulatory Visit (INDEPENDENT_AMBULATORY_CARE_PROVIDER_SITE_OTHER): Payer: Self-pay | Admitting: Orthopedic Surgery

## 2020-03-25 ENCOUNTER — Encounter: Payer: Self-pay | Admitting: Critical Care Medicine

## 2020-03-25 ENCOUNTER — Other Ambulatory Visit: Payer: Self-pay

## 2020-03-25 VITALS — BP 172/108 | HR 74 | Resp 16 | Wt 217.0 lb

## 2020-03-25 DIAGNOSIS — E119 Type 2 diabetes mellitus without complications: Secondary | ICD-10-CM

## 2020-03-25 DIAGNOSIS — Z7901 Long term (current) use of anticoagulants: Secondary | ICD-10-CM

## 2020-03-25 DIAGNOSIS — M545 Low back pain, unspecified: Secondary | ICD-10-CM

## 2020-03-25 DIAGNOSIS — E7800 Pure hypercholesterolemia, unspecified: Secondary | ICD-10-CM

## 2020-03-25 DIAGNOSIS — M79672 Pain in left foot: Secondary | ICD-10-CM

## 2020-03-25 DIAGNOSIS — H1131 Conjunctival hemorrhage, right eye: Secondary | ICD-10-CM | POA: Insufficient documentation

## 2020-03-25 DIAGNOSIS — E1169 Type 2 diabetes mellitus with other specified complication: Secondary | ICD-10-CM

## 2020-03-25 DIAGNOSIS — Z96651 Presence of right artificial knee joint: Secondary | ICD-10-CM

## 2020-03-25 DIAGNOSIS — E669 Obesity, unspecified: Secondary | ICD-10-CM

## 2020-03-25 DIAGNOSIS — M79671 Pain in right foot: Secondary | ICD-10-CM

## 2020-03-25 DIAGNOSIS — K219 Gastro-esophageal reflux disease without esophagitis: Secondary | ICD-10-CM

## 2020-03-25 DIAGNOSIS — I10 Essential (primary) hypertension: Secondary | ICD-10-CM

## 2020-03-25 DIAGNOSIS — I482 Chronic atrial fibrillation, unspecified: Secondary | ICD-10-CM

## 2020-03-25 LAB — POCT GLYCOSYLATED HEMOGLOBIN (HGB A1C): Hemoglobin A1C: 5 % (ref 4.0–5.6)

## 2020-03-25 LAB — GLUCOSE, POCT (MANUAL RESULT ENTRY): POC Glucose: 82 mg/dl (ref 70–99)

## 2020-03-25 MED ORDER — DILTIAZEM HCL ER COATED BEADS 120 MG PO CP24: 120.0000 mg | ORAL_CAPSULE | Freq: Every day | ORAL | 2 refills | Status: DC

## 2020-03-25 MED ORDER — PANTOPRAZOLE SODIUM 40 MG PO TBEC: 40.0000 mg | DELAYED_RELEASE_TABLET | Freq: Every day | ORAL | 3 refills | Status: DC

## 2020-03-25 MED ORDER — AMLODIPINE BESYLATE 5 MG PO TABS: 5.0000 mg | ORAL_TABLET | Freq: Every day | ORAL | 1 refills | Status: DC

## 2020-03-25 MED ORDER — TRAMADOL HCL 50 MG PO TABS
50.0000 mg | ORAL_TABLET | Freq: Three times a day (TID) | ORAL | 0 refills | Status: AC | PRN
Start: 2020-03-25 — End: 2020-03-30

## 2020-03-25 MED ORDER — LORATADINE 10 MG PO TABS
10.0000 mg | ORAL_TABLET | Freq: Every day | ORAL | 4 refills | Status: DC
Start: 2020-03-25 — End: 2020-06-26

## 2020-03-25 MED ORDER — ROSUVASTATIN CALCIUM 10 MG PO TABS: 10.0000 mg | ORAL_TABLET | Freq: Every day | ORAL | 1 refills | Status: DC

## 2020-03-25 MED ORDER — APIXABAN 5 MG PO TABS
ORAL_TABLET | ORAL | 0 refills | Status: DC
Start: 2020-03-25 — End: 2020-04-16

## 2020-03-25 MED ORDER — GABAPENTIN 100 MG PO CAPS: 100.0000 mg | ORAL_CAPSULE | Freq: Three times a day (TID) | ORAL | 1 refills | Status: DC

## 2020-03-25 NOTE — Assessment & Plan Note (Signed)
Appears to be in sinus rhythm today we will continue diltiazem but hold Eliquis may yet need further cardiology assessment

## 2020-03-25 NOTE — Patient Instructions (Addendum)
Refills on all your medications were sent to our pharmacy they are working on it now to pick up today before you leave  Stop Eliquis do not take any more of this medication to return to see Dr.Newlin  Resume amlodipine 5 mg 1 tablet daily Resume Cardizem diltiazem 120 mg 1 tablet daily Resume gabapentin 3 times daily for nerve pain in the legs Resume lovastatin 1 daily for cholesterol You may use Tylenol acetaminophen as needed for leg pain in addition to the as needed tramadol that you already have Resume pantoprazole for stomach acid Refills on loratadine sent to our pharmacy  Return to see Dr. Alvis Lemmings in 2 weeks for reassessment  Les recharges de tous vos mdicaments ont t envoyes  notre pharmacie ils y travaillent maintenant pour venir les chercher aujourd'hui avant votre dpart  Arrtez Eliquis ne prenez plus ce mdicament pour retourner voir le Dr.Newlin  Reprendre l'amlodipine 5 mg 1 comprim par jour Reprendre Cardizem diltiazem 120 mg 1 comprim par jour Reprendre la gabapentine 3 fois par [employer] pour [employer] nerveuses dans les jambes Reprendre la lovastatine 1 par jour pour le cholestrol Vous pouvez utiliser l'actaminophne Tylenol au besoin pour les douleurs aux jambes en plus du tramadol au besoin que vous avez dj Reprendre le pantoprazole pour l'acide gastrique Recharges de loratadine envoyes  notre pharmacie  Retournez voir le Dr Roselind Rily 2 semaines pour [employer] rvaluation

## 2020-03-25 NOTE — Assessment & Plan Note (Signed)
Patient still taking glipizide I did ask her to stop the glipizide and did not refill it  Note A1c is 5.0 at this visit

## 2020-03-25 NOTE — Progress Notes (Signed)
Subjective:    Patient ID: [name] [name], adult    DOB: 23-Sep-1950, 70 y.o.   MRN: [id number]  70 y.o.F originally from [place] speaks [name]  Patient's visit is assisted with language interpreter friend of hers named early for [name]  Patient comes in as an acute work in visit primary care patient of Dr. Baxter Flattery.  She awoken 4 nights ago from sleep with pain and a lump in the right found to have significant hemorrhage in the conjunctiva of the right eye.  Note she is very confused about her medications and apparently has not been taking her amlodipine or diltiazem and on arrival blood pressure is 172/108.  She also is on chronic Eliquis therapy for atrial fibrillation and she has continued taking this.  Patient also has continued her glipizide therapy despite being told from the last visit with Dr. Ezzard Standing to stop the glipizide.  When going down her medication list she is out of most of her medicines and is taking some and not taking others very confused about all the medications.  Patient does take Tylenol along with the tramadol that was given at the last visit for chronic leg pain she also does take gabapentin.  She states the vision in the right eye has not reduced but she has soreness in the right eye and she denies any trauma.  The patient only brought her bottle of tramadol with her no other medications were brought with her   Past Medical History:  Diagnosis Date  . Acute kidney injury (H[facility]) 03/02/2019  . Acute respiratory failure with hypoxia ([facility]) 02/22/2019  . Atrial fibrillation with RVR ([facility]) 02/26/2019  . COVID-19 02/22/2019  . Flu-like symptoms 02/22/2019  . Hypertension   . Knee osteoarthritis 10/24/2017     Family History  Family history unknown: Yes     Social History   Socioeconomic History  . Marital status: Widowed    Spouse name: Not on file  . Number of children: Not on file  . Years of education: Not on file  . Highest education level: Not on  file  Occupational History  . Not on file  Tobacco Use  . Smoking status: Never Smoker  . Smokeless tobacco: Never Used  Vaping Use  . Vaping Use: Never used  Substance and Sexual Activity  . Alcohol use: Never  . Drug use: Never  . Sexual activity: Not Currently    Birth control/protection: Post-menopausal  Other Topics Concern  . Not on file  Social History Narrative  . Not on file   Social Determinants of Health   Financial Resource Strain: Not on file  Food Insecurity: Not on file  Transportation Needs: No Transportation Needs  . Lack of Transportation (Medical): No  . Lack of Transportation (Non-Medical): No  Physical Activity: Not on file  Stress: Not on file  Social Connections: Not on file  Intimate Partner Violence: Not on file     No Known Allergies   Outpatient Medications Prior to Visit  Medication Sig Dispense Refill  . Psyllium (METAMUCIL PO) Take 1 Scoop by mouth daily.    Marland Kitchen triamcinolone cream (KENALOG) 0.1 % APPLY TO DRY SKIN 1-2 TIMES DAILY SPARINGLY (Patient taking differently: Apply 1 application topically See admin instructions. Apply to dry skin 1-2 times daily sparingly) 30 g 2  . amLODipine (NORVASC) 2.5 MG tablet TAKE 2 TABLETS (5 MG TOTAL) BY MOUTH DAILY. TO LOWER BLOOD PRESSURE 60 tablet 3  . diltiazem (CARDIZEM CD) 120 MG  24 hr capsule Take 1 capsule (120 mg total) by mouth daily. 30 capsule 11  . ELIQUIS 5 MG TABS tablet TAKE 1 TABLET (5 MG TOTAL) BY MOUTH 2 (TWO) TIMES DAILY. 180 tablet 0  . furosemide (LASIX) 20 MG tablet Take one pill daily for 3 days then as needed for swelling 10 tablet 0  . gabapentin (NEURONTIN) 100 MG capsule Take 1 capsule (100 mg total) by mouth 3 (three) times daily. 90 capsule 1  . loratadine (CLARITIN) 10 MG tablet Take 1 tablet (10 mg total) by mouth daily. 30 tablet 0  . methocarbamol (ROBAXIN) 500 MG tablet Take 1 tablet (500 mg total) by mouth every 8 (eight) hours as needed for muscle spasms. 30 tablet 0  .  pantoprazole (PROTONIX) 40 MG tablet Take 1 tablet (40 mg total) by mouth daily. To reduce stomach acid 90 tablet 3  . rosuvastatin (CRESTOR) 10 MG tablet Take 1 tablet (10 mg total) by mouth daily. To lower cholesterol 90 tablet 1   No facility-administered medications prior to visit.     Review of Systems  Constitutional: Negative.   HENT: Negative.   Eyes: Positive for redness.  Respiratory: Negative.   Cardiovascular: Negative.   Gastrointestinal: Negative.   Genitourinary: Negative.   Musculoskeletal: Positive for gait problem. Negative for joint swelling.  Neurological: Positive for headaches.  Hematological: Bruises/bleeds easily.  Psychiatric/Behavioral: Negative.        Objective:   Physical Exam Vitals:   03/25/20 1037  BP: (!) 172/108  Pulse: 74  Resp: 16  SpO2: 97%  Weight: 217 lb (98.4 kg)    Gen: Pleasant, well-nourished, in no distress,  normal affect  ENT: Significant conjunctival hemorrhage of the right eye but there is no evidence of intraocular bleeding and the retina appears intact iris moves normally extract movements are intact mouth clear,  oropharynx clear, no postnasal drip  Neck: No JVD, no TMG, no carotid bruits  Lungs: No use of accessory muscles, no dullness to percussion, clear without rales or rhonchi  Cardiovascular: RRR, heart sounds normal, no murmur or gallops, no peripheral edema  Abdomen: soft and NT, no HSM,  BS normal  Musculoskeletal: No deformities, no cyanosis or clubbing  Neuro: alert, non focal  Skin: Warm, no lesions or rashes     Assessment & Plan:  I personally reviewed all images and lab data in the [facility] system as well as any outside material available during this office visit and agree with the  radiology impressions.   Conjunctival hemorrhage of right eye Acute conjunctival hemorrhage of the right eye exacerbated by Eliquis and hypertension not well controlled  Blood pressure not controlled because she is not  taking her blood pressure medications owing to confusion over medication profile  I have advised this patient to stop Eliquis for now Patient to resume amlodipine 5 mg daily and diltiazem 120 mg daily  No specific therapy of the eye needed and no indication for ophthalmology referral  Short-term 2-week follow-up with her primary care  Chronic anticoagulation The patient's heart rate and rhythm appears to be sinus today I did not obtain an EKG  I am going to hold the anticoagulation for now and reassess she may not be a good candidate for chronic anticoagulation with high fall risk and recurrent bleeding  Chronic atrial fibrillation (HCC) Appears to be in sinus rhythm today we will continue diltiazem but hold Eliquis may yet need further cardiology assessment  Type 2 diabetes mellitus with obesity (HCC)  Patient still taking glipizide I did ask her to stop the glipizide and did not refill it  Note A1c is 5.0 at this visit   Diagnoses and all orders for this visit:  Controlled type 2 diabetes mellitus without complication, without long-term current use of insulin (HCC) -     HgB A1c -     Glucose (CBG)  Essential hypertension -     amLODipine (NORVASC) 5 MG tablet; Take 1 tablet (5 mg total) by mouth daily. To lower blood pressure  Low back pain with radiation -     gabapentin (NEURONTIN) 100 MG capsule; Take 1 capsule (100 mg total) by mouth 3 (three) times daily.  Pain in both feet -     gabapentin (NEURONTIN) 100 MG capsule; Take 1 capsule (100 mg total) by mouth 3 (three) times daily.  Gastroesophageal reflux disease, unspecified whether esophagitis present -     pantoprazole (PROTONIX) 40 MG tablet; Take 1 tablet (40 mg total) by mouth daily. To reduce stomach acid  Hypercholesterolemia -     rosuvastatin (CRESTOR) 10 MG tablet; Take 1 tablet (10 mg total) by mouth daily. To lower cholesterol  Conjunctival hemorrhage of right eye  Chronic anticoagulation  Chronic  atrial fibrillation (HCC)  Other orders -     diltiazem (CARDIZEM CD) 120 MG 24 hr capsule; Take 1 capsule (120 mg total) by mouth daily. -     apixaban (ELIQUIS) 5 MG TABS tablet; HOLD -     loratadine (CLARITIN) 10 MG tablet; Take 1 tablet (10 mg total) by mouth daily. Patient asked to bring all medications in with the next visit

## 2020-03-25 NOTE — Assessment & Plan Note (Signed)
Acute conjunctival hemorrhage of the right eye exacerbated by Eliquis and hypertension not well controlled  Blood pressure not controlled because she is not taking her blood pressure medications owing to confusion over medication profile  I have advised this patient to stop Eliquis for now Patient to resume amlodipine 5 mg daily and diltiazem 120 mg daily  No specific therapy of the eye needed and no indication for ophthalmology referral  Short-term 2-week follow-up with her primary care

## 2020-03-25 NOTE — Telephone Encounter (Signed)
Broken vessel in right eye causing mild pain and blurriness. Began 3 days ago and getting worse. No fever, no injury. Little discharge when watching the tele. Has interpreter with her.   Reason for Disposition . Eye pain present > 24 hours  Answer Assessment - Initial Assessment Questions 1. ONSET: "When did the pain start?" (e.g., minutes, hours, days)     Mild pain with blurry vision in the right eye today. 2. TIMING: "Does the pain come and go, or has it been constant since it started?" (e.g., constant, intermittent, fleeting)     Comes and goes 3. SEVERITY: "How bad is the pain?"   (Scale 1-10; mild, moderate or severe)   - MILD (1-3): doesn't interfere with normal activities    - MODERATE (4-7): interferes with normal activities or awakens from sleep    - SEVERE (8-10): excruciating pain and patient unable to do normal activities     mild 4. LOCATION: "Where does it hurt?"  (e.g., eyelid, eye, cheekbone)     The eyeball 5. CAUSE: "What do you think is causing the pain?"     Broken blood vessel 6. VISION: "Do you have blurred vision or changes in your vision?"      blurry 7. EYE DISCHARGE: "Is there any discharge (pus) from the eye(s)?"  If yes, ask: "What color is it?"      Only when she watches tele 8. FEVER: "Do you have a fever?" If Yes, ask: "What is it, how was it measured, and when did it start?"      no 9. OTHER SYMPTOMS: "Do you have any other symptoms?" (e.g., headache, nasal discharge, facial rash)     none 10. PREGNANCY: "Is there any chance you are pregnant?" "When was your last menstrual period?"       na  Protocols used: EYE PAIN-A-AH

## 2020-03-25 NOTE — Assessment & Plan Note (Signed)
The patient's heart rate and rhythm appears to be sinus today I did not obtain an EKG  I am going to hold the anticoagulation for now and reassess she may not be a good candidate for chronic anticoagulation with high fall risk and recurrent bleeding

## 2020-03-25 NOTE — Progress Notes (Signed)
Broken blood vessel in right eye

## 2020-03-27 ENCOUNTER — Ambulatory Visit: Payer: Self-pay | Admitting: Orthopedic Surgery

## 2020-04-01 ENCOUNTER — Encounter: Payer: Self-pay | Admitting: Orthopedic Surgery

## 2020-04-01 ENCOUNTER — Other Ambulatory Visit: Payer: Self-pay

## 2020-04-01 ENCOUNTER — Ambulatory Visit: Payer: Self-pay | Attending: Family Medicine

## 2020-04-01 NOTE — Progress Notes (Signed)
   Post-Op Visit Note   Patient: [name] [name]           Date of Birth: 1950-03-03           MRN: [id number] Visit Date: 03/25/2020 PCP: Hoy Register, MD   Assessment & Plan:  Chief Complaint:  Chief Complaint  Patient presents with  . Right Knee - Routine Post Op   Visit Diagnoses:  1. Status post total right knee replacement     Plan: We are presents now about 4 months out right total knee replacement.  Ambulating with a walker and doing well.  She is done with physical therapy.  She states that her left knee is also painful.  Hard for her to sleep on the left-hand side.  This has been going on for 1 week.  She has been taking tramadol and muscle relaxer left over.  On examination of the right knee she has slight varus alignment but good ankle dorsiflexion strength full extension and about 100 degrees of flexion.  Left knee has more severe varus deformity.  Trace effusion present in 5 degree flexion contracture present.  Plan at this time is for 1 year return for repeat radiographs on the right knee.  Okay for tramadol twice a day.  Follow-up in 1 year for clinical recheck.  Follow-Up Instructions: Return in about 1 year (around 03/25/2021).   Orders:  No orders of the defined types were placed in this encounter.  No orders of the defined types were placed in this encounter.   Imaging: No results found.  PMFS History: Patient Active Problem List   Diagnosis Date Noted  . Conjunctival hemorrhage of right eye 03/25/2020  . Chronic anticoagulation 03/25/2020  . 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 S/P knee surgery 12/03/2019  . 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 Chronic atrial fibrillation (HCC) 02/26/2019  . Essential hypertension, benign 05/03/2018  . Knee osteoarthritis 10/24/2017   Past Medical History:  Diagnosis Date  . Acute kidney injury (HCC) 03/02/2019  . Acute respiratory failure with hypoxia (HCC) 02/22/2019  . Atrial  fibrillation with RVR (HCC) 02/26/2019  . COVID-19 02/22/2019  . Flu-like symptoms 02/22/2019  . Hypertension   . Knee osteoarthritis 10/24/2017    Family History  Family history unknown: Yes    Past Surgical History:  Procedure Laterality Date  . NO PAST SURGERIES    . TOTAL KNEE ARTHROPLASTY Right 12/03/2019   Procedure: RIGHT TOTAL KNEE ARTHROPLASTY;  Surgeon: Cammy Copa, MD;  Location: [facility] OR;  Service: Orthopedics;  Laterality: Right;   Social History   Occupational History  . Not on file  Tobacco Use  . Smoking status: Never Smoker  . Smokeless tobacco: Never Used  Vaping Use  . Vaping Use: Never used  Substance and Sexual Activity  . Alcohol use: Never  . Drug use: Never  . Sexual activity: Not Currently    Birth control/protection: Post-menopausal

## 2020-04-06 ENCOUNTER — Telehealth: Payer: Self-pay | Admitting: Pediatric Intensive Care

## 2020-04-06 NOTE — Telephone Encounter (Signed)
Follow up call to client's husband. He had left a message for CN regarding medication for client. Jed states that client needs her blood pressure medication. CN reviewed medications via EMR. CN advised she will call pharmacy to see what medications are available for refill. CN also advised that she has limited availability for home visits due to changes in clinic schedule. Jed advised that client has legal appointment on 3/17 and that may be a conflict for PCP appointment. CN will confirm this with client friend Early Katrinka Blazing. Shann Medal RN BSN CNP [phone]

## 2020-04-08 ENCOUNTER — Encounter: Payer: Self-pay | Admitting: Pediatric Intensive Care

## 2020-04-08 NOTE — Congregational Nurse Program (Signed)
  Dept: [phone]   Congregational Nurse Program Note  Date of Encounter: 04/08/2020  Past Medical History: Past Medical History:  Diagnosis Date  . Acute kidney injury (HCC) 03/02/2019  . Acute respiratory failure with hypoxia (HCC) 02/22/2019  . Atrial fibrillation with RVR (HCC) 02/26/2019  . COVID-19 02/22/2019  . Flu-like symptoms 02/22/2019  . Hypertension   . Knee osteoarthritis 10/24/2017    Encounter Details:Encounter at client's home to check medication. Client's spouse Jed with [name] interpretation. Client is concerned that she does not have her blood pressure medication. She was seen at clinic for right eye hemorrhage on 2/23. She had supplies of all medication except loratidine. CN reinforced holding apixiban until she has her PCP follow up. CN had discussed potential conflict with PCP appointment and attorney appointment on 3/17. Client states continued pain issues, especially in left knee. CN will review ortho notes regarding potential knee replacment that side. Client has tramadol for pain. CN suggested taking 1/2 tab 50mg  for moderate pain. CN will follow up with friend Early regarding PCP appointment.  Katrinka Blazing RN BSN CNP [phone]

## 2020-04-16 ENCOUNTER — Encounter: Payer: Self-pay | Admitting: Family Medicine

## 2020-04-16 ENCOUNTER — Ambulatory Visit: Payer: Self-pay | Attending: Family Medicine | Admitting: Family Medicine

## 2020-04-16 ENCOUNTER — Other Ambulatory Visit: Payer: Self-pay

## 2020-04-16 ENCOUNTER — Other Ambulatory Visit: Payer: Self-pay | Admitting: Pharmacist

## 2020-04-16 ENCOUNTER — Other Ambulatory Visit: Payer: Self-pay | Admitting: Family Medicine

## 2020-04-16 VITALS — BP 146/86 | HR 88 | Ht 62.0 in | Wt 223.0 lb

## 2020-04-16 DIAGNOSIS — M25561 Pain in right knee: Secondary | ICD-10-CM

## 2020-04-16 DIAGNOSIS — G8929 Other chronic pain: Secondary | ICD-10-CM

## 2020-04-16 DIAGNOSIS — I482 Chronic atrial fibrillation, unspecified: Secondary | ICD-10-CM

## 2020-04-16 DIAGNOSIS — M25562 Pain in left knee: Secondary | ICD-10-CM

## 2020-04-16 DIAGNOSIS — H1131 Conjunctival hemorrhage, right eye: Secondary | ICD-10-CM

## 2020-04-16 MED ORDER — TRAMADOL HCL 50 MG PO TABS: 50.0000 mg | ORAL_TABLET | Freq: Every day | ORAL | 0 refills | Status: DC

## 2020-04-16 MED ORDER — APIXABAN 5 MG PO TABS
5.0000 mg | ORAL_TABLET | Freq: Two times a day (BID) | ORAL | 1 refills | Status: DC
  Filled 2020-06-22: qty 60, 30d supply, fill #0
  Filled 2020-07-31: qty 60, 30d supply, fill #1
  Filled 2020-08-26: qty 60, 30d supply, fill #2

## 2020-04-16 NOTE — Progress Notes (Signed)
Subjective:  Patient ID: [name] [name], adult    DOB: January 03, 1951  Age: 70 y.o. MRN: [id number]  CC: Knee Pain (/)   HPI [name] [name] 70 year old female with a history of Type 2 DM (A1c 6.0), Hypertension,  A.fib with RVR, s/p R TKA in 12/2019 seen for follow-up of right subconjunctival hemorrhage. She was seen by Dr. Delford Field 3 weeks ago and her Eliquis was discontinued at that time due to subconjunctival hemorrhage and high risk for falls per Dr. Lynelle Doctor notes. Hemorrhage in her R eye has improved and she has been off Eliquis.Her vision is normal in her right eye but she does have slight blurry vision in her left eye which is not new. She denies history of falls even though she has chronic knee pains.  She has no hematuria, hematochezia, epistaxis. Requests a refill of tramadol for her knee pains.  Past Medical History:  Diagnosis Date  . Acute kidney injury (HCC) 03/02/2019  . Acute respiratory failure with hypoxia (HCC) 02/22/2019  . Atrial fibrillation with RVR (HCC) 02/26/2019  . COVID-19 02/22/2019  . Flu-like symptoms 02/22/2019  . Hypertension   . Knee osteoarthritis 10/24/2017    Past Surgical History:  Procedure Laterality Date  . NO PAST SURGERIES    . TOTAL KNEE ARTHROPLASTY Right 12/03/2019   Procedure: RIGHT TOTAL KNEE ARTHROPLASTY;  Surgeon: Cammy Copa, MD;  Location: [facility] OR;  Service: Orthopedics;  Laterality: Right;    Family History  Family history unknown: Yes    No Known Allergies  Outpatient Medications Prior to Visit  Medication Sig Dispense Refill  . amLODipine (NORVASC) 5 MG tablet Take 1 tablet (5 mg total) by mouth daily. To lower blood pressure 90 tablet 1  . diltiazem (CARDIZEM CD) 120 MG 24 hr capsule Take 1 capsule (120 mg total) by mouth daily. 90 capsule 2  . gabapentin (NEURONTIN) 100 MG capsule Take 1 capsule (100 mg total) by mouth 3 (three) times daily. 90 capsule 1  . loratadine (CLARITIN) 10 MG tablet Take 1  tablet (10 mg total) by mouth daily. 30 tablet 4  . pantoprazole (PROTONIX) 40 MG tablet Take 1 tablet (40 mg total) by mouth daily. To reduce stomach acid 90 tablet 3  . Psyllium (METAMUCIL PO) Take 1 Scoop by mouth daily.    . rosuvastatin (CRESTOR) 10 MG tablet Take 1 tablet (10 mg total) by mouth daily. To lower cholesterol 90 tablet 1  . triamcinolone cream (KENALOG) 0.1 % APPLY TO DRY SKIN 1-2 TIMES DAILY SPARINGLY (Patient taking differently: Apply 1 application topically See admin instructions. Apply to dry skin 1-2 times daily sparingly) 30 g 2  . apixaban (ELIQUIS) 5 MG TABS tablet HOLD (Patient not taking: Reported on 04/16/2020) 180 tablet 0   No facility-administered medications prior to visit.     ROS Review of Systems  Constitutional: Negative for activity change, appetite change and fatigue.  HENT: Negative for congestion, sinus pressure and sore throat.   Eyes: Positive for redness. Negative for visual disturbance.  Respiratory: Negative for cough, chest tightness, shortness of breath and wheezing.   Cardiovascular: Negative for chest pain and palpitations.  Gastrointestinal: Negative for abdominal distention, abdominal pain and constipation.  Endocrine: Negative for polydipsia.  Genitourinary: Negative for dysuria and frequency.  Musculoskeletal:       See HPI  Skin: Negative for rash.  Neurological: Negative for tremors, light-headedness and numbness.  Hematological: Does not bruise/bleed easily.  Psychiatric/Behavioral: Negative for agitation and  behavioral problems.    Objective:  BP (!) 146/86   Pulse 88   Ht 5\' 2"  (1.575 m)   Wt 223 lb (101.2 kg)   LMP  (LMP Unknown)   SpO2 96%   BMI 40.79 kg/m   BP/Weight 04/16/2020 03/25/2020 12/16/2019  Systolic BP 146 172 129  Diastolic BP 86 108 75  Wt. (Lbs) 223 217 240  BMI 40.79 39.38 43.55      Physical Exam Constitutional:      Appearance: She is well-developed.  Eyes:     Comments: Slight hemorrhage  superior to the right pupi  Neck:     Vascular: No JVD.  Cardiovascular:     Rate and Rhythm: Normal rate.     Heart sounds: Normal heart sounds. No murmur heard.   Pulmonary:     Effort: Pulmonary effort is normal.     Breath sounds: Normal breath sounds. No wheezing or rales.  Chest:     Chest wall: No tenderness.  Abdominal:     General: Bowel sounds are normal. There is no distension.     Palpations: Abdomen is soft. There is no mass.     Tenderness: There is no abdominal tenderness.  Musculoskeletal:     Comments: Tenderness in right knee on range of motion  Neurological:     Mental Status: She is alert and oriented to person, place, and time.  Psychiatric:        Mood and Affect: Mood normal.     CMP Latest Ref Rng & Units 12/16/2019 12/04/2019 11/28/2019  Glucose 65 - 99 mg/dL 11/30/2019) 42(V) 80  BUN 8 - 27 mg/dL 9 11 16   Creatinine 0.57 - 1.00 mg/dL 956(L 8.75  Sodium 134 - 144 mmol/L 143 138 142  Potassium 3.5 - 5.2 mmol/L 4.2 4.2 4.0  Chloride 96 - 106 mmol/L 106 104 109  CO2 20 - 29 mmol/L 21 23 23   Calcium 8.7 - 10.3 mg/dL 8.7 6.43) 9.1  Total Protein 6.5 - 8.1 g/dL - 6.3(L) -  Total Bilirubin 0.3 - 1.2 mg/dL - 0.4 -  Alkaline Phos 38 - 126 U/L - 55 -  AST 15 - 41 U/L - 19 -  ALT 0 - 44 U/L - 17 -    Lipid Panel     Component Value Date/Time   CHOL 211 (H) 08/08/2018 1145   TRIG 102 02/22/2019 1703   HDL 45 08/08/2018 1145   CHOLHDL 4.7 (H) 08/08/2018 1145   LDLCALC 150 (H) 08/08/2018 1145    CBC    Component Value Date/Time   WBC 6.8 12/16/2019 1200   WBC 9.5 12/05/2019 1040   RBC 4.09 12/16/2019 1200   RBC 4.23 12/05/2019 1040   HGB 10.3 (L) 12/16/2019 1200   HCT 33.0 (L) 12/16/2019 1200   PLT 274 12/16/2019 1200   MCV 81 12/16/2019 1200   MCH 25.2 (L) 12/16/2019 1200   MCH 24.1 (L) 12/05/2019 1040   MCHC 31.2 (L) 12/16/2019 1200   MCHC 31.8 12/05/2019 1040   RDW 17.9 (H) 12/16/2019 1200   LYMPHSABS 2.1 12/16/2019 1200   MONOABS 0.3  03/01/2019 0742   EOSABS 0.1 12/16/2019 1200   BASOSABS 0.0 12/16/2019 1200    Lab Results  Component Value Date   HGBA1C 5.0 03/25/2020    Assessment & Plan:  1. Subconjunctival hemorrhage of right eye Improving Educated on pathophysiology of the condition and the fact that it is self-limiting Doubt that this was related to her  Eliquis use  2. Chronic atrial fibrillation (HCC) Benefits of Eliquis outweigh her risk She has had no recent falls but her gait is stable Advised to restart Eliquis Counseled that if subconjunctival hemorrhage worsens, she notices any GI bleed or epistaxis she needs to notify me. - apixaban (ELIQUIS) 5 MG TABS tablet; Take 1 tablet (5 mg total) by mouth 2 (two) times daily.  Dispense: 180 tablet; Refill: 1  3. Chronic pain of both knees Status post right total knee replacement - traMADol (ULTRAM) 50 MG tablet; Take 1 tablet (50 mg total) by mouth at bedtime.  Dispense: 30 tablet; Refill: 0    Meds ordered this encounter  Medications  . apixaban (ELIQUIS) 5 MG TABS tablet    Sig: Take 1 tablet (5 mg total) by mouth 2 (two) times daily.    Dispense:  180 tablet    Refill:  1  . traMADol (ULTRAM) 50 MG tablet    Sig: Take 1 tablet (50 mg total) by mouth at bedtime.    Dispense:  30 tablet    Refill:  0    Follow-up: Return for Medical conditions, keep previously scheduled appointment.       Hoy Register, MD, FAAFP. [facility] [place], [place] [phone]   04/16/2020, 11:34 AM

## 2020-04-16 NOTE — Progress Notes (Signed)
Follow up for busted blood vessel in eye.

## 2020-04-16 NOTE — Patient Instructions (Signed)
Subconjunctival Hemorrhage Subconjunctival hemorrhage is bleeding that happens between the white part of your eye (sclera) and the clear membrane that covers the outside of your eye (conjunctiva). There are many tiny blood vessels near the surface of your eye. A subconjunctival hemorrhage happens when one or more of these vessels breaks and bleeds, causing a red patch to appear on your eye. This is similar to a bruise. Depending on the amount of bleeding, the red patch may only cover a small area of your eye or it may cover the entire visible part of the sclera. If a lot of blood collects under the conjunctiva, there may also be swelling. Subconjunctival hemorrhages do not affect your vision or cause pain, but your eye may feel irritated if there is swelling. Subconjunctival hemorrhages usually do not require treatment, and they usually disappear on their own within two weeks. What are the causes? This condition may be caused by:  Mild trauma, such as rubbing your eye too hard.  Blunt injuries, such as from playing sports or having contact with a deployed airbag.  Coughing, sneezing, or vomiting.  Straining, such as when lifting a heavy object.  High blood pressure.  Recent eye surgery.  Diabetes.  Certain medicines, especially blood thinners (anticoagulants).  Other conditions, such as eye tumors, bleeding disorders, or blood vessel abnormalities. Subconjunctival hemorrhages can also happen without an obvious cause. What are the signs or symptoms? Symptoms of this condition include:  A bright red or dark red patch on the white part of the eye. The red area may: ? Spread out to cover a larger area of the eye before it goes away. ? Turn brownish-yellow before it goes away.  Swelling around the eye.  Mild eye irritation.   How is this diagnosed? This condition is diagnosed with a physical exam. If your subconjunctival hemorrhage was caused by trauma, your health care provider may  refer you to an eye specialist (ophthalmologist) or another specialist to check for other injuries. You may have other tests, including:  An eye exam.  A blood pressure check.  Blood tests to check for bleeding disorders. If your subconjunctival hemorrhage was caused by trauma, X-rays or a CT scan may be done to check for other injuries. How is this treated? Usually, treatment is not needed for this condition. If you have discomfort, your health care provider may recommend eye drops or cold compresses. Follow these instructions at home:  Take over-the-counter and prescription medicines only as directed by your health care provider.  Use eye drops or cold compresses to help with discomfort as directed by your health care provider.  Avoid activities, things, and environments that may irritate or injure your eye.  Keep all follow-up visits as told by your health care provider. This is important. Contact a health care provider if:  You have pain in your eye.  The bleeding does not go away within 3 weeks.  You keep getting new subconjunctival hemorrhages. Get help right away if:  Your vision changes or you have difficulty seeing.  You suddenly develop severe sensitivity to light.  You develop a severe headache, persistent vomiting, confusion, or abnormal tiredness (lethargy).  Your eye seems to bulge or protrude from your eye socket.  You develop unexplained bruises on your body.  You have unexplained bleeding in another area of your body. Summary  Subconjunctival hemorrhage is bleeding that happens between the white part of your eye and the clear membrane that covers the outside of your eye.    This condition is similar to a bruise.  Subconjunctival hemorrhages usually do not require treatment, and they usually disappear on their own within two weeks.  Use eye drops or cold compresses to help with discomfort as directed by your health care provider. This information is not  intended to replace advice given to you by your health care provider. Make sure you discuss any questions you have with your health care provider. Document Revised: 07/04/2018 Document Reviewed: 10/18/2017 Elsevier Patient Education  2021 Elsevier Inc.  

## 2020-05-21 ENCOUNTER — Other Ambulatory Visit: Payer: Self-pay

## 2020-05-21 ENCOUNTER — Other Ambulatory Visit: Payer: Self-pay | Admitting: Critical Care Medicine

## 2020-05-21 MED ORDER — GABAPENTIN 100 MG PO CAPS
ORAL_CAPSULE | Freq: Three times a day (TID) | ORAL | 1 refills | Status: DC
  Filled 2020-05-21: qty 90, 30d supply, fill #0
  Filled 2020-06-22: qty 90, 30d supply, fill #1

## 2020-05-21 MED FILL — Diltiazem HCl Coated Beads Cap ER 24HR 120 MG: ORAL | 30 days supply | Qty: 30 | Fill #0 | Status: AC

## 2020-05-21 MED FILL — Pantoprazole Sodium EC Tab 40 MG (Base Equiv): ORAL | 30 days supply | Qty: 30 | Fill #0 | Status: AC

## 2020-05-21 MED FILL — Tramadol HCl Tab 50 MG: ORAL | 20 days supply | Qty: 40 | Fill #0 | Status: AC

## 2020-05-21 MED FILL — Rosuvastatin Calcium Tab 10 MG: ORAL | 30 days supply | Qty: 30 | Fill #0 | Status: AC

## 2020-05-21 MED FILL — Amlodipine Besylate Tab 5 MG (Base Equivalent): ORAL | 30 days supply | Qty: 30 | Fill #0 | Status: AC

## 2020-05-22 ENCOUNTER — Other Ambulatory Visit: Payer: Self-pay

## 2020-05-27 ENCOUNTER — Ambulatory Visit (INDEPENDENT_AMBULATORY_CARE_PROVIDER_SITE_OTHER): Payer: Self-pay | Admitting: Orthopedic Surgery

## 2020-05-27 ENCOUNTER — Ambulatory Visit (INDEPENDENT_AMBULATORY_CARE_PROVIDER_SITE_OTHER): Payer: Self-pay

## 2020-05-27 ENCOUNTER — Other Ambulatory Visit: Payer: Self-pay

## 2020-05-27 ENCOUNTER — Encounter: Payer: Self-pay | Admitting: Orthopedic Surgery

## 2020-05-27 DIAGNOSIS — M25562 Pain in left knee: Secondary | ICD-10-CM

## 2020-05-27 NOTE — Progress Notes (Signed)
Office Visit Note   Patient: [name] [name]           Date of Birth: 08/30/1950           MRN: [id number] Visit Date: 05/27/2020 Requested by: Hoy Register, MD [address] [place],  [place] [address] PCP: Hoy Register, MD  Subjective: Chief Complaint  Patient presents with  . Other     Left knee discuss TKA    HPI: [name] [name] is a 70 year old patient who underwent right total knee replacement 6 months ago.  She did well with that.  She also has end-stage left knee arthritis and would like to have that replaced.  She reports night pain rest pain and pain which wakes her from sleep at night.  No prior surgeries on left knee.  She is here with a Nurse, learning disability and her friend.  Denies any other orthopedic complaints and states she overall feels well but would like to have her knee improved for pain relief purposes.              ROS: All systems reviewed are negative as they relate to the chief complaint within the history of present illness.  Patient denies  fevers or chills.   Assessment & Plan: Visit Diagnoses:  1. Left knee pain, unspecified chronicity     Plan: Impression is end-stage left knee arthritis.  Plan is left total knee replacement with constrained options available.  She has some varus deformity but not quite as bad as she had on the right-hand side.  Risk benefits of surgery are discussed.  Essentially there is same as they were last time.  Do plan to get cardiac risk ratification prior to surgery.  She has chronic atrial fibrillation and will need to be managed in terms of cardiac medication in the perioperative period  Follow-Up Instructions: No follow-ups on file.   Orders:  Orders Placed This Encounter  Procedures  . XR KNEE 3 VIEW LEFT   No orders of the defined types were placed in this encounter.     Procedures: No procedures performed   Clinical Data: No additional findings.  Objective: Vital Signs: LMP  (LMP Unknown)   Physical  Exam:   Constitutional: Patient appears well-developed HEENT:  Head: Normocephalic Eyes:EOM are normal Neck: Normal range of motion Cardiovascular: Normal rate Pulmonary/chest: Effort normal Neurologic: Patient is alert Skin: Skin is warm Psychiatric: Patient has normal mood and affect    Ortho Exam: Ortho exam demonstrates varus alignment left lower extremity.  Pedal pulses 1+ out of 4 bilaterally.  Range of motion is 5 to 90 degrees on that left knee.  Extensor mechanism is intact.  No groin pain with internal or external Tatian of the leg.  Very mild edema present in that left lower extremity.  No calf tenderness negative Homans.  Specialty Comments:  No specialty comments available.  Imaging: XR KNEE 3 VIEW LEFT  Result Date: 05/27/2020 AP lateral merchant radiographs left knee reviewed.  Varus alignment is present.  End-stage tricompartmental arthritis also present worse in the medial compartment.  No acute fracture.  Bone quality looks reasonable.    PMFS History: Patient Active Problem List   Diagnosis Date Noted  . Conjunctival hemorrhage of right eye 03/25/2020  . Chronic anticoagulation 03/25/2020  . 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 S/P knee surgery 12/03/2019  . 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 Chronic atrial fibrillation (HCC) 02/26/2019  . Essential hypertension, benign 05/03/2018  .  Knee osteoarthritis 10/24/2017   Past Medical History:  Diagnosis Date  . Acute kidney injury (HCC) 03/02/2019  . Acute respiratory failure with hypoxia (HCC) 02/22/2019  . Atrial fibrillation with RVR (HCC) 02/26/2019  . COVID-19 02/22/2019  . Flu-like symptoms 02/22/2019  . Hypertension   . Knee osteoarthritis 10/24/2017    Family History  Family history unknown: Yes    Past Surgical History:  Procedure Laterality Date  . NO PAST SURGERIES    . TOTAL KNEE ARTHROPLASTY Right 12/03/2019   Procedure: RIGHT TOTAL KNEE ARTHROPLASTY;  Surgeon:  Cammy Copa, MD;  Location: [facility] OR;  Service: Orthopedics;  Laterality: Right;   Social History   Occupational History  . Not on file  Tobacco Use  . Smoking status: Never Smoker  . Smokeless tobacco: Never Used  Vaping Use  . Vaping Use: Never used  Substance and Sexual Activity  . Alcohol use: Never  . Drug use: Never  . Sexual activity: Not Currently    Birth control/protection: Post-menopausal

## 2020-06-22 ENCOUNTER — Telehealth: Payer: Self-pay | Admitting: Pediatric Intensive Care

## 2020-06-22 ENCOUNTER — Telehealth: Payer: Self-pay | Admitting: Physician Assistant

## 2020-06-22 ENCOUNTER — Other Ambulatory Visit: Payer: Self-pay

## 2020-06-22 MED FILL — Amlodipine Besylate Tab 5 MG (Base Equivalent): ORAL | 30 days supply | Qty: 30 | Fill #1 | Status: AC

## 2020-06-22 MED FILL — Diltiazem HCl Coated Beads Cap ER 24HR 120 MG: ORAL | 30 days supply | Qty: 30 | Fill #1 | Status: AC

## 2020-06-22 MED FILL — Pantoprazole Sodium EC Tab 40 MG (Base Equiv): ORAL | 30 days supply | Qty: 30 | Fill #1 | Status: AC

## 2020-06-22 NOTE — Telephone Encounter (Signed)
Left HIPAA compliant message to return call. Dameisha Tschida RN BSN CNP [phone] 

## 2020-06-22 NOTE — Telephone Encounter (Signed)
Patient with diagnosis of atrial fibrillation on Eliquis for anticoagulation.    Procedure: left total knee replacement Date of procedure: 07/07/20   CHA2DS2-VASc Score = 4  This indicates a 4.8% annual risk of stroke. The patient's score is based upon: CHF History: No HTN History: Yes Diabetes History: Yes Stroke History: No Vascular Disease History: No Age Score: 1 Gender Score: 1  CrCl 87.7 (43.4 with IBW) Platelet count 274  Per office protocol, patient can hold Eliquis for 3 days prior to procedure.   Patient will not need bridging with Lovenox (enoxaparin) around procedure.  For orthopedic procedures please be sure to resume therapeutic (not prophylactic) dosing.

## 2020-06-22 NOTE — Telephone Encounter (Signed)
   Name: Veronnica Hennings  DOB: [birth year]  MRN: [id number]  Primary Cardiologist: Verne Carrow, MD  Chart reviewed as part of pre-operative protocol coverage. Because of Laronica Bhagat past medical history and time since last visit, she will require a follow-up visit in order to better assess preoperative cardiovascular risk.  Pre-op covering staff: - Please schedule appointment and call patient to inform them. If patient already had an upcoming appointment within acceptable timeframe, please add "pre-op clearance" to the appointment notes so provider is aware. - Please contact requesting surgeon's office via preferred method (i.e, phone, fax) to inform them of need for appointment prior to surgery. - Note routed to pharmacist as well for recommendations on holding anticoagulation.   Tereso Newcomer, PA-C  06/22/2020, 2:57 PM

## 2020-06-22 NOTE — Telephone Encounter (Signed)
   [facility] Pre-operative Risk Assessment    Patient Name: [name] [name]  DOB: 1950-12-10  MRN: [id number]   HEARTCARE STAFF: - Please ensure there is not already an duplicate clearance open for this procedure. - Under Visit Info/Reason for Call, type in Other and utilize the format Clearance MM/DD/YY or Clearance TBD. Do not use dashes or single digits. - If request is for dental extraction, please clarify the # of teeth to be extracted.  Request for surgical clearance:  1. What type of surgery is being performed? Left Total Knee Replacement   2. When is this surgery scheduled? 07-07-20  3. What type of clearance is required (medical clearance vs. Pharmacy clearance to hold med vs. Both)? Medical  4. Are there any medications that need to be held prior to surgery and how long?   5. Practice name and name of physician performing surgery?  Dr Marcene Duos*   6. What is the office phone number? [phone]      What is the office fax number?  [phone]  8.   Anesthesia type (None, local, MAC, general) ?She did not know this   Glyn Ade 06/22/2020, 10:12 AM  _________________________________________________________________   (provider comments below)

## 2020-06-22 NOTE — Telephone Encounter (Signed)
Call to [facility] for advice regarding client cardiology clearance for total knee replacement on 6/7. Spoke with Nettie Elm. Gave contact information for Candiss Norse and POC Morrie Sheldon in DR Dean's office. Shann Medal Rn BSN CNP [phone]

## 2020-06-23 NOTE — Telephone Encounter (Signed)
1st attempt to reach pt to schedule a appointment for surgical clearance.  No answer / no voicemail box set up.

## 2020-06-23 NOTE — Telephone Encounter (Signed)
Please schedule this Friday with Nada Boozer, NP as noted. Tereso Newcomer, PA-C    06/23/2020 5:05 PM

## 2020-06-23 NOTE — Telephone Encounter (Signed)
I called the pt and she put her husband on the phone ([name] [name]). Pt speaks [name], does not understand [place]. Pt's husband has been informed of the sooner appt for pre op clearance. Pt has been scheduled now to see Nada Boozer, NP 06/26/20 @ 2:45. Pt's husband said he will set up the interpreter to come to appt with the pt; interpreter is Early Katrinka Blazing. Pt's husband did say that the interpreter may call the office to verify information for the appt. I will send clearance note to Nada Boozer, NP as well as Lorain Childes to requesting office pt has appt 06/26/20.

## 2020-06-23 NOTE — Telephone Encounter (Signed)
Received a secure chat from [employer], RN, that she works with pt and needed an appt for surgical clearance.  I scheduled with Hubbard Hartshorn, NP,  07/20/2020.  Pt's surgery is 07/07/2020 and is asking for a sooner appt.  Doren Custard, RN, that I would send to Dr. Gibson Ramp RN to see if they can work her in sooner.  [place] can be reached at [phone]

## 2020-06-23 NOTE — Telephone Encounter (Signed)
Pt has been scheduled to see Hubbard Hartshorn, NP, 07/20/2020 and clearance will be addressed at that visit.  Will route back to the requesting surgeon's office to make them aware.

## 2020-06-24 NOTE — Telephone Encounter (Signed)
thx

## 2020-06-24 NOTE — Telephone Encounter (Signed)
Pt has OV 5/27 for clearance. This phone note will be removed from the preop pool. Tereso Newcomer, PA-C  06/24/2020 10:05 AM

## 2020-06-26 ENCOUNTER — Encounter: Payer: Self-pay | Admitting: Cardiology

## 2020-06-26 ENCOUNTER — Ambulatory Visit (INDEPENDENT_AMBULATORY_CARE_PROVIDER_SITE_OTHER): Payer: Self-pay | Admitting: Cardiology

## 2020-06-26 ENCOUNTER — Other Ambulatory Visit: Payer: Self-pay

## 2020-06-26 VITALS — BP 142/88 | HR 75 | Ht 62.0 in | Wt 227.0 lb

## 2020-06-26 DIAGNOSIS — Z01818 Encounter for other preprocedural examination: Secondary | ICD-10-CM

## 2020-06-26 DIAGNOSIS — I10 Essential (primary) hypertension: Secondary | ICD-10-CM

## 2020-06-26 DIAGNOSIS — E1169 Type 2 diabetes mellitus with other specified complication: Secondary | ICD-10-CM

## 2020-06-26 DIAGNOSIS — E669 Obesity, unspecified: Secondary | ICD-10-CM

## 2020-06-26 DIAGNOSIS — I480 Paroxysmal atrial fibrillation: Secondary | ICD-10-CM

## 2020-06-26 NOTE — Progress Notes (Signed)
Cardiology Office Note   Date:  06/26/2020    ID:  Ellee, Wawrzyniak 1950/10/26, MRN [id number]  PCP:  Hoy Register, MD  Cardiologist:  Dr. Clifton James   Chief Complaint  Patient presents with  . Pre-op Exam      History of Present Illness: [name] [name] is a 70 y.o. adult who presents for pre op eval lt total knee by Dr Rise Paganini  history of paroxysmal atrial fibrillation, hypertension, CKD and arthritis seen for surgical clearance.  Admitted January 2021 for new onset atrial fibrillation with rapid ventricular rate in setting of COVID-19 pneumonia.  Placed on Eliquis for anticoagulation.  Echo with LV function of 60 to 65%, mild MR.  She was seen by Dr. Mayford Knife during admission.  Patient was seen in clinic April 2021 by Dr. Clifton James.  Recommended continuation of Cardizem and Eliquis given cha2ds2vasc score of 3.  Seen for surgical clearance with help of in-house [name] interpreter.  Previously cleared for similar surgery in July however it was canceled due to miscommunication.  seen in Sept for clearance for rt total knee and did well.  Echo last year with EF 60-65% LA severely dilated, A mildly dilated mild MR, mild TR   Today  no chest pain no SOB she can walk up at least one flight of steps without issues, no hx of CAD no further atrial fib that she is aware of.  She did well with her last total knee.  She has french interpreter today.     Past Medical History:  Diagnosis Date  . Acute kidney injury (HCC) 03/02/2019  . Acute respiratory failure with hypoxia (HCC) 02/22/2019  . Atrial fibrillation with RVR (HCC) 02/26/2019  . COVID-19 02/22/2019  . Flu-like symptoms 02/22/2019  . Hypertension   . Knee osteoarthritis 10/24/2017    Past Surgical History:  Procedure Laterality Date  . NO PAST SURGERIES    . TOTAL KNEE ARTHROPLASTY Right 12/03/2019   Procedure: RIGHT TOTAL KNEE ARTHROPLASTY;  Surgeon: Cammy Copa, MD;  Location: [facility] OR;   Service: Orthopedics;  Laterality: Right;     Current Outpatient Medications  Medication Sig Dispense Refill  . amLODipine (NORVASC) 5 MG tablet Take 1 tablet (5 mg total) by mouth daily. To lower blood pressure 90 tablet 1  . apixaban (ELIQUIS) 5 MG TABS tablet Take 1 tablet (5 mg total) by mouth 2 (two) times daily. 180 tablet 1  . diltiazem (CARDIZEM CD) 120 MG 24 hr capsule TAKE 1 CAPSULE (120 MG TOTAL) BY MOUTH DAILY. (Patient taking differently: Take 120 mg by mouth daily.) 90 capsule 2  . furosemide (LASIX) 20 MG tablet TAKE 1 TABLET BY MOUTHL DAILY FOR 3 DAYS THEN AS NEEDED FOR SWELLING 10 tablet 0  . gabapentin (NEURONTIN) 100 MG capsule TAKE 1 CAPSULE (100 MG TOTAL) BY MOUTH 3 (THREE) TIMES DAILY. (Patient taking differently: Take 100 mg by mouth 3 (three) times daily.) 90 capsule 1  . pantoprazole (PROTONIX) 40 MG tablet TAKE 1 TABLET (40 MG TOTAL) BY MOUTH DAILY. TO REDUCE STOMACH ACID (Patient taking differently: Take 40 mg by mouth daily.) 90 tablet 3  . rosuvastatin (CRESTOR) 10 MG tablet Take 1 tablet (10 mg total) by mouth daily. To lower cholesterol 90 tablet 1   No current facility-administered medications for this visit.    Allergies:   Patient has no known allergies.    Social History:  The patient  reports that she has never smoked. She has never used  smokeless tobacco. She reports that she does not drink alcohol and does not use drugs.   Family History:  The patient's Family history is unknown by patient.    ROS:  General:no colds or fevers, no weight changes Skin:no rashes or ulcers HEENT:no blurred vision, no congestion CV:see HPI PUL:see HPI GI:no diarrhea constipation or melena, no indigestion GU:no hematuria, no dysuria MS:no joint pain, no claudication Neuro:no syncope, no lightheadedness Endo:no diabetes, no thyroid disease   Wt Readings from Last 3 Encounters:  06/26/20 227 lb (103 kg)  04/16/20 223 lb (101.2 kg)  03/25/20 217 lb (98.4 kg)      PHYSICAL EXAM: VS:  BP (!) 142/88   Pulse 75   Ht 5\' 2"  (1.575 m)   Wt 227 lb (103 kg)   LMP  (LMP Unknown)   BMI 41.52 kg/m  , BMI Body mass index is 41.52 kg/m. General:Pleasant affect, NAD Skin:Warm and dry, brisk capillary refill HEENT:normocephalic, sclera clear, mucus membranes moist Neck:supple, no JVD, no bruits  Heart:S1S2 RRR without murmur, gallup, rub or click Lungs:clear without rales, rhonchi, or wheezes , non tender, + BS, do not palpate liver spleen or masses Ext:no lower ext edema, 2+ pedal pulses, 2+ radial pulses Neuro:alert and oriented, MAE, follows commands, + facial symmetry    EKG:  EKG is ordered today. The ekg ordered today demonstrates SR at 75 and no changes from last   Recent Labs: 10/11/2019: TSH 0.956 12/04/2019: ALT 17 12/16/2019: BUN 9; Creatinine, Ser 0.94; Hemoglobin 10.3; Platelets 274; Potassium 4.2; Sodium 143    Lipid Panel    Component Value Date/Time   CHOL 211 (H) 08/08/2018 1145   TRIG 102 02/22/2019 1703   HDL 45 08/08/2018 1145   CHOLHDL 4.7 (H) 08/08/2018 1145   LDLCALC 150 (H) 08/08/2018 1145       Other studies Reviewed: Additional studies/ records that were reviewed today include: . Echo   02/28/20 IMPRESSIONS    1. Left ventricular ejection fraction, by visual estimation, is 60 to  65%. Left ventricular septal wall thickness was moderately increased.  Moderately increased left ventricular posterior wall thickness. There is  moderately increased left ventricular  wall thickness.  2. Left atrial size was severely dilated.  3. Right atrial size was mildly dilated.  4. Mild mitral valve regurgitation.  5. Tricuspid valve regurgitation is mild.  6. Tricuspid valve regurgitation is mild.  7. Mildly elevated pulmonary artery systolic pressure.  8. The inferior vena cava is dilated in size with >50% respiratory  variability, suggesting right atrial pressure of 8 mmHg.   FINDINGS  Left  Ventricle: Left ventricular ejection fraction, by visual estimation,  is 60 to 65%. Left ventricular septal wall thickness was moderately  increased. Moderately increased left ventricular posterior wall thickness.  There is moderately increased left  ventricular wall thickness.   Right Ventricle: The tricuspid regurgitant velocity is 2.41 m/s, and with  an assumed right atrial pressure of 8 mmHg, the estimated right  ventricular systolic pressure is mildly elevated at 31.2 mmHg.   Left Atrium: Left atrial size was severely dilated.   Right Atrium: Right atrial size was mildly dilated. Right atrial pressure  is estimated at 8 mmHg.   Mitral Valve: Mild mitral valve regurgitation.   Tricuspid Valve: Tricuspid valve regurgitation is mild.   Aortic Valve: The aortic valve is tricuspid. Aortic valve mean gradient  measures 3.4 mmHg. Aortic valve peak gradient measures 6.5 mmHg. Aortic  valve area, by VTI measures 1.67 cm.  Venous: The inferior vena cava is dilated in size with greater than 50%  respiratory variability, suggesting right atrial pressure of 8 mmHg.   ASSESSMENT AND PLAN:  1.  Pre-op eval.  For surgery  Lt total knee. Pt is low cardiac risk for low risk surgery.  She has had hx of atrial fib and we discussed it could return.  She is maintainig SR on EKG and no changes.  She meets 4 METS of activity.  She can hold eiliquis for 3 days prior to surgery.    2. PAF no further episodes on anticoagulation with eliquis and no bleeding. Continue dilt. Last echo with normal Ef 02/2019 Patient with diagnosis of atrial fibrillation on Eliquis for anticoagulation.    Procedure: left total knee replacement Date of procedure: 07/07/20   CHA2DS2-VASc Score = 4  This indicates a 4.8% annual risk of stroke. The patient's score is based upon: CHF History: No HTN History: Yes Diabetes History: Yes Stroke History: No Vascular Disease History: No Age Score: 1 Gender Score:  1  CrCl 87.7 (43.4 with IBW) Platelet count 274  Per office protocol, patient can hold Eliquis for 3 days prior to procedure.   Patient will not need bridging with Lovenox (enoxaparin) around procedure.  For orthopedic procedures please be sure to resume therapeutic (not prophylactic) dosing.  3. HTN controlled   4.  DM followed by PCP.   She will follow up with Dr. Clifton James in 6 months.     Current medicines are reviewed with the patient today.  The patient Has no concerns regarding medicines.  The following changes have been made:  See above Labs/ tests ordered today include:see above  Disposition:   FU:  see above  Signed, Nada Boozer, NP  06/26/2020 5:11 PM    [facility] [address] [place], [place], [place]  [address]/ [address] [place], [place] Phone: [phone]; Fax: [phone]  [phone]

## 2020-06-26 NOTE — Patient Instructions (Addendum)
Medication Instructions:  Last dose of Eliquis before surgery 07/03/20 evening dose is last dose,.  The orthopedic doctor will tell you when to resume.   *If you need a refill on your cardiac medications before your next appointment, please call your pharmacy*   Lab Work: None ordered   If you have labs (blood work) drawn today and your tests are completely normal, you will receive your results only by: Marland Kitchen MyChart Message (if you have MyChart) OR . A paper copy in the mail If you have any lab test that is abnormal or we need to change your treatment, we will call you to review the results.   Testing/Procedures: None ordered    Follow-Up: At [facility], you and your health needs are our priority.  As part of our continuing mission to provide you with exceptional heart care, we have created designated Provider Care Teams.  These Care Teams include your primary Cardiologist (physician) and Advanced Practice Providers (APPs -  Physician Assistants and Nurse Practitioners) who all work together to provide you with the care you need, when you need it.  We recommend signing up for the patient portal called "MyChart".  Sign up information is provided on this After Visit Summary.  MyChart is used to connect with patients for Virtual Visits (Telemedicine).  Patients are able to view lab/test results, encounter notes, upcoming appointments, etc.  Non-urgent messages can be sent to your provider as well.   To learn more about what you can do with MyChart, go to [email or web address].    Your next appointment:   6 month(s)  The format for your next appointment:   In Person  Provider:   You may see Verne Carrow, MD or one of the following Advanced Practice Providers on your designated Care Team:    Ronie Spies, PA-C  Jacolyn Reedy, PA-C    Other Instructions

## 2020-06-30 NOTE — Progress Notes (Signed)
Surgical Instructions    Your procedure is scheduled on Tuesday 07/07/2020.  Report to [facility] Main Entrance "A" at 05:30 A.M., then check in with the Admitting office.  Call this number if you have problems the morning of surgery:  [phone]   If you have any questions prior to your surgery date call [phone]: Open Monday-Friday 8am-4pm    Remember:  Do not eat after midnight the night before your surgery  You may drink clear liquids until 04:30 the morning of your surgery.   Clear liquids allowed are: Water, Non-Citrus Juices (without pulp), Carbonated Beverages, Clear Tea, Black Coffee Only, and Gatorade   Enhanced Recovery after Surgery for Orthopedics Enhanced Recovery after Surgery is a protocol used to improve the stress on your body and your recovery after surgery.  Patient Instructions  . The day of surgery (if you do NOT have diabetes):  o Drink ONE (1) Pre-Surgery Clear Ensure by 4:30 am the morning of surgery   o This drink was given to you during your hospital  pre-op appointment visit. o Nothing else to drink after completing the  Pre-Surgery Clear Ensure.         If you have questions, please contact your surgeon's office.     Take these medicines the morning of surgery with A SIP OF WATER  . Amlodipine (Norvasc) . Diltiazem (Cardizem) . Gabapentin (Neurontin) . Pantoprazole (Protonix) . Rosuvastatin (Crestor)   As of today, STOP taking any Aspirin (unless otherwise instructed by your surgeon) Aleve, Naproxen, Ibuprofen, Motrin, Advil, Goody's, BC's, all herbal medications, fish oil, and all vitamins.  HOLD Apixaban (Eliquis) 3 days prior to surgery.  Last dose will be on Thursday 07/02/20          Do not wear jewelry, make up, or nail polish Do Not wear nail polish, gel polish, artificial nails, or any other type of covering on natural nails including finger and toenails.  If patients have artificial nails, gel coating, etc. that need to be removed  by a nail saloon please have this removed prior to surgery or surgery may need to be canceled/delayed if the surgeon/ anesthesia feels like the patient is unable to be adequately monitored. Do not wear lotions, powders, perfumes/colognes, or deodorant. Do not shave 48 hours prior to surgery.  Men may shave face and neck. Do not bring valuables to the hospital. [facility] is not responsible for any belongings or valuables.  Do NOT Smoke (Tobacco/Vaping) or drink Alcohol 24 hours prior to your procedure  If you use a CPAP at night, you may bring all equipment for your overnight stay.   Contacts, glasses, Hearing aids, dentures or partials may not be worn into surgery, please bring cases for these belongings   For patients admitted to the hospital, discharge time will be determined by your treatment team.   Patients discharged the day of surgery will not be allowed to drive home, and someone needs to stay with them for 24 hours.    Special instructions:    Oral Hygiene is also important to reduce your risk of infection.  Remember - BRUSH YOUR TEETH THE MORNING OF SURGERY WITH YOUR REGULAR TOOTHPASTE   [facility]- Preparing For Surgery  Before surgery, you can play an important role. Because skin is not sterile, your skin needs to be as free of germs as possible. You can reduce the number of germs on your skin by washing with CHG (chlorahexidine gluconate) Soap before surgery.  CHG is an  antiseptic cleaner which kills germs and bonds with the skin to continue killing germs even after washing.     Please do not use if you have an allergy to CHG or antibacterial soaps. If your skin becomes reddened/irritated stop using the CHG.  Do not shave (including legs and underarms) for at least 48 hours prior to first CHG shower. It is OK to shave your face.  Please follow these instructions carefully.    1.  Shower the NIGHT BEFORE SURGERY and the MORNING OF SURGERY with CHG Soap.   If you  chose to wash your hair, wash your hair first as usual with your normal shampoo. After you shampoo, rinse your hair and body thoroughly to remove the shampoo.  Then [employer] and genitals (private parts) with your normal soap and rinse thoroughly to remove soap.  2. After that Use CHG Soap as you would any other liquid soap. You can apply CHG directly to the skin and wash gently with a scrungie or a clean washcloth.   3. Apply the CHG Soap to your body ONLY FROM THE NECK DOWN.  Do not use on open wounds or open sores. Avoid contact with your eyes, ears, mouth and genitals (private parts). Wash Face and genitals (private parts)  with your normal soap.   4. Wash thoroughly, paying special attention to the area where your surgery will be performed.  5. Thoroughly rinse your body with warm water from the neck down.  6. DO NOT shower/wash with your normal soap after using and rinsing off the CHG Soap.  7. Pat yourself dry with a CLEAN TOWEL.  8. Wear CLEAN PAJAMAS to bed the night before surgery  9. Place CLEAN SHEETS on your bed the night before your surgery  10. DO NOT SLEEP WITH PETS.   Day of Surgery:  Take a shower with CHG soap. Wear Clean/Comfortable clothing the morning of surgery Do not apply any deodorants/lotions.   Remember to brush your teeth WITH YOUR REGULAR TOOTHPASTE.   Please read over the following fact sheets that you were given.

## 2020-07-01 ENCOUNTER — Encounter (HOSPITAL_COMMUNITY)
Admission: RE | Admit: 2020-07-01 | Discharge: 2020-07-01 | Disposition: A | Discharge status: Home / Self Care | Payer: Self-pay | Source: Ambulatory Visit | Attending: Orthopedic Surgery | Admitting: Orthopedic Surgery

## 2020-07-01 ENCOUNTER — Other Ambulatory Visit: Payer: Self-pay

## 2020-07-01 ENCOUNTER — Encounter (HOSPITAL_COMMUNITY): Payer: Self-pay

## 2020-07-01 DIAGNOSIS — Z01812 Encounter for preprocedural laboratory examination: Secondary | ICD-10-CM | POA: Insufficient documentation

## 2020-07-01 HISTORY — DX: Pneumonia, unspecified organism: J18.9

## 2020-07-01 HISTORY — DX: Cardiac arrhythmia, unspecified: I49.9

## 2020-07-01 LAB — CBC
HCT: 38.5 % (ref 36.0–46.0)
Hemoglobin: 12 g/dL (ref 12.0–15.0)
MCH: 22.9 pg — ABNORMAL LOW (ref 26.0–34.0)
MCHC: 31.2 g/dL (ref 30.0–36.0)
MCV: 73.6 fL — ABNORMAL LOW (ref 80.0–100.0)
Platelets: 79 10*3/uL — ABNORMAL LOW (ref 150–400)
RBC: 5.23 MIL/uL — ABNORMAL HIGH (ref 3.87–5.11)
RDW: 19.9 % — ABNORMAL HIGH (ref 11.5–15.5)
WBC: 5.6 10*3/uL (ref 4.0–10.5)
nRBC: 0 % (ref 0.0–0.2)

## 2020-07-01 LAB — BASIC METABOLIC PANEL
Anion gap: 9 (ref 5–15)
BUN: 22 mg/dL (ref 8–23)
CO2: 23 mmol/L (ref 22–32)
Calcium: 8.8 mg/dL — ABNORMAL LOW (ref 8.9–10.3)
Chloride: 106 mmol/L (ref 98–111)
Creatinine, Ser: 1.19 mg/dL — ABNORMAL HIGH (ref 0.44–1.00)
GFR, Estimated: 49 mL/min — ABNORMAL LOW (ref >60)
Glucose, Bld: 90 mg/dL (ref 70–99)
Potassium: 4.6 mmol/L (ref 3.5–5.1)
Sodium: 138 mmol/L (ref 135–145)

## 2020-07-01 LAB — URINALYSIS, ROUTINE W REFLEX MICROSCOPIC
Bilirubin Urine: NEGATIVE
Glucose, UA: NEGATIVE mg/dL
Hgb urine dipstick: NEGATIVE
Ketones, ur: NEGATIVE mg/dL
Leukocytes,Ua: NEGATIVE
Nitrite: NEGATIVE
Protein, ur: NEGATIVE mg/dL
Specific Gravity, Urine: 1.008 (ref 1.005–1.030)
pH: 7 (ref 5.0–8.0)

## 2020-07-01 LAB — SURGICAL PCR SCREEN
MRSA, PCR: NEGATIVE
Staphylococcus aureus: NEGATIVE

## 2020-07-01 NOTE — Progress Notes (Signed)
PCP - enobong newlin Cardiologist - Dr,. Mcalhany  PPM/ICD - denies   Chest x-ray - n/a EKG - 06/26/20 Stress Test - denies ECHO - 02/28/19 Cardiac Cath - denies  Sleep Study - denies   No diabetes  As of today, STOP taking any Aspirin (unless otherwise instructed by your surgeon) Aleve, Naproxen, Ibuprofen, Motrin, Advil, Goody's, BC's, all herbal medications, fish oil, and all vitamins.  HOLD Apixaban (Eliquis) 3 days prior to surgery.  Last dose will be on Thursday 07/03/20.  ERAS Protcol -yes PRE-SURGERY Ensure or G2- ensure given  COVID TEST- 07/03/20 scheduled at drive thru   Anesthesia review: yes, cardiac clearance 06/26/20  Patient denies shortness of breath, fever, cough and chest pain at PAT appointment   All instructions explained to the patient, with a verbal understanding of the material. Patient agrees to go over the instructions while at home for a better understanding. Patient also instructed to self quarantine after being tested for COVID-19. The opportunity to ask questions was provided.

## 2020-07-01 NOTE — Progress Notes (Signed)
Surgical Instructions    Your procedure is scheduled on Tuesday 07/07/2020.  Report to [facility] Main Entrance "A" at 05:30 A.M., then check in with the Admitting office.  Call this number if you have problems the morning of surgery:  [phone]   If you have any questions prior to your surgery date call [phone]: Open Monday-Friday 8am-4pm    Remember:  Do not eat after midnight the night before your surgery  You may drink clear liquids until 04:30 the morning of your surgery.   Clear liquids allowed are: Water, Non-Citrus Juices (without pulp), Carbonated Beverages, Clear Tea, Black Coffee Only, and Gatorade   Enhanced Recovery after Surgery for Orthopedics Enhanced Recovery after Surgery is a protocol used to improve the stress on your body and your recovery after surgery.  Patient Instructions  . The day of surgery (if you do NOT have diabetes):  o Drink ONE (1) Pre-Surgery Clear Ensure by 4:30 am the morning of surgery   o This drink was given to you during your hospital  pre-op appointment visit. o Nothing else to drink after completing the  Pre-Surgery Clear Ensure.         If you have questions, please contact your surgeon's office.     Take these medicines the morning of surgery with A SIP OF WATER  . Amlodipine (Norvasc) . Diltiazem (Cardizem) . Gabapentin (Neurontin) . Pantoprazole (Protonix) . Rosuvastatin (Crestor)   As of today, STOP taking any Aspirin (unless otherwise instructed by your surgeon) Aleve, Naproxen, Ibuprofen, Motrin, Advil, Goody's, BC's, all herbal medications, fish oil, and all vitamins.  HOLD Apixaban (Eliquis) 3 days prior to surgery.  Last dose will be on Thursday 07/03/20          Do not wear jewelry, make up, or nail polish Do Not wear nail polish, gel polish, artificial nails, or any other type of covering on natural nails including finger and toenails.  If patients have artificial nails, gel coating, etc. that need to be removed  by a nail saloon please have this removed prior to surgery or surgery may need to be canceled/delayed if the surgeon/ anesthesia feels like the patient is unable to be adequately monitored. Do not wear lotions, powders, perfumes/colognes, or deodorant. Do not shave 48 hours prior to surgery.  Men may shave face and neck. Do not bring valuables to the hospital. [facility] is not responsible for any belongings or valuables.  Do NOT Smoke (Tobacco/Vaping) or drink Alcohol 24 hours prior to your procedure  If you use a CPAP at night, you may bring all equipment for your overnight stay.   Contacts, glasses, Hearing aids, dentures or partials may not be worn into surgery, please bring cases for these belongings   For patients admitted to the hospital, discharge time will be determined by your treatment team.   Patients discharged the day of surgery will not be allowed to drive home, and someone needs to stay with them for 24 hours.    Special instructions:    Oral Hygiene is also important to reduce your risk of infection.  Remember - BRUSH YOUR TEETH THE MORNING OF SURGERY WITH YOUR REGULAR TOOTHPASTE   - Preparing For Surgery  Before surgery, you can play an important role. Because skin is not sterile, your skin needs to be as free of germs as possible. You can reduce the number of germs on your skin by washing with CHG (chlorahexidine gluconate) Soap before surgery.  CHG is an  antiseptic cleaner which kills germs and bonds with the skin to continue killing germs even after washing.     Please do not use if you have an allergy to CHG or antibacterial soaps. If your skin becomes reddened/irritated stop using the CHG.  Do not shave (including legs and underarms) for at least 48 hours prior to first CHG shower. It is OK to shave your face.  Please follow these instructions carefully.    1.  Shower the NIGHT BEFORE SURGERY and the MORNING OF SURGERY with CHG Soap.   If you  chose to wash your hair, wash your hair first as usual with your normal shampoo. After you shampoo, rinse your hair and body thoroughly to remove the shampoo.  Then [employer] and genitals (private parts) with your normal soap and rinse thoroughly to remove soap.  2. After that Use CHG Soap as you would any other liquid soap. You can apply CHG directly to the skin and wash gently with a scrungie or a clean washcloth.   3. Apply the CHG Soap to your body ONLY FROM THE NECK DOWN.  Do not use on open wounds or open sores. Avoid contact with your eyes, ears, mouth and genitals (private parts). Wash Face and genitals (private parts)  with your normal soap.   4. Wash thoroughly, paying special attention to the area where your surgery will be performed.  5. Thoroughly rinse your body with warm water from the neck down.  6. DO NOT shower/wash with your normal soap after using and rinsing off the CHG Soap.  7. Pat yourself dry with a CLEAN TOWEL.  8. Wear CLEAN PAJAMAS to bed the night before surgery  9. Place CLEAN SHEETS on your bed the night before your surgery  10. DO NOT SLEEP WITH PETS.   Day of Surgery:  Take a shower with CHG soap. Wear Clean/Comfortable clothing the morning of surgery Do not apply any deodorants/lotions.   Remember to brush your teeth WITH YOUR REGULAR TOOTHPASTE.   Please read over the following fact sheets that you were given.

## 2020-07-02 ENCOUNTER — Telehealth: Payer: Self-pay | Admitting: Orthopedic Surgery

## 2020-07-02 LAB — URINE CULTURE

## 2020-07-02 NOTE — Telephone Encounter (Signed)
Needs repeat labs on Friday afternoon

## 2020-07-02 NOTE — Progress Notes (Signed)
Anesthesia Chart Review:  Follows with cardiology for history of paroxysmal atrial fibrillation, HTN. Admitted January 2021 for new onset atrial fibrillation with rapid ventricular rate in setting of COVID-19 pneumonia. Placed on Eliquis for anticoagulation. Echo with LV function of 60 to 65%, mild MR. Patient was seen in clinic April 2021 by Dr. Clifton Ira Dougher. Recommended continuation of Cardizem and Eliquis given cha2ds2vasc score of 3.  Preop clearance per progress note 06/26/2020, "  Pre-op eval.  For surgery  Lt total knee. Pt is low cardiac risk for low risk surgery.  She has had hx of atrial fib and we discussed it could return.  She is maintainig SR on EKG and no changes.  She meets 4 METS of activity.  She can hold eiliquis for 3 days prior to surgery."  Preop labs reviewed, moderate thrombocytopenia with platelets of 79,000.  Prior labs on 12/16/2019 showed platelets 274,000.  Review of records in Epic shows platelets have been low previously.  Lowest value in epic was 97,000 on 01/25/2018.  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 Was advised to follow up as needed and continue with PCP for monitoring.   This result was called to Dr. Diamantina Providence office.  It was also discussed with Dr. Maple Hudson who advised that due to thrombocytopenia that is somewhat unstable, patient may receive general versus spinal anesthesia, but this will ultimately be up to the anesthesiologist on day of surgery.  EKG 10/24/19:  Sinus rhythm. Rate 70.  TTE 02/28/19: 1. Left ventricular ejection fraction, by visual estimation, is 60 to  65%. Left ventricular septal wall thickness was moderately increased.  Moderately increased left ventricular posterior wall thickness. There is  moderately increased left ventricular  wall thickness.  2. Left atrial size was severely dilated.  3. Right atrial size was mildly dilated.  4. Mild  mitral valve regurgitation.  5. Tricuspid valve regurgitation is mild.  6. Tricuspid valve regurgitation is mild.  7. Mildly elevated pulmonary artery systolic pressure.  8. The inferior vena cava is dilated in size with >50% respiratory  variability, suggesting right atrial pressure of 8 mmHg.

## 2020-07-02 NOTE — Telephone Encounter (Signed)
[name] [name] with Short Stay called in reference to patients labs.  Patient has had a history of low platelets, however there is a new low of 79,000 today. Platelet level in November was 274, 000.  Lowest level in Epic was in 2019 at  97,000.  With the levels not being stable, there is the possibility of general anesthesia instead of spinal.  [name] [name] consulted with anesthesiologist Dr. Maple Hudson and he said it may be up to the anesthesiologist on the day of patient's surgery.    Patient is scheduled for left total knee 07-07-20 at 7:30am with Dr. August Saucer.      [name] [name] w/Short Stay  [phone]

## 2020-07-03 ENCOUNTER — Other Ambulatory Visit (HOSPITAL_COMMUNITY)
Admission: RE | Admit: 2020-07-03 | Discharge: 2020-07-03 | Disposition: A | Discharge status: Home / Self Care | Payer: Self-pay | Source: Ambulatory Visit | Attending: Orthopedic Surgery | Admitting: Orthopedic Surgery

## 2020-07-03 DIAGNOSIS — Z20822 Contact with and (suspected) exposure to covid-19: Secondary | ICD-10-CM | POA: Insufficient documentation

## 2020-07-03 DIAGNOSIS — Z01812 Encounter for preprocedural laboratory examination: Secondary | ICD-10-CM | POA: Insufficient documentation

## 2020-07-03 LAB — SARS CORONAVIRUS 2 (TAT 6-24 HRS): SARS Coronavirus 2: NEGATIVE

## 2020-07-03 NOTE — Telephone Encounter (Signed)
Called about getting them to come in this afternoon for labs but stated unable to come in this afternoon.  They will come first thing on Monday morning for repeat STAT CBC with diff to recheck platelet count.  Also informed that if platelets still low chance surgery would be cancelled.

## 2020-07-03 NOTE — Telephone Encounter (Signed)
thx

## 2020-07-06 ENCOUNTER — Ambulatory Visit: Payer: Self-pay

## 2020-07-06 ENCOUNTER — Other Ambulatory Visit: Payer: Self-pay

## 2020-07-06 DIAGNOSIS — M25562 Pain in left knee: Secondary | ICD-10-CM

## 2020-07-06 LAB — CBC WITH DIFFERENTIAL/PLATELET
Absolute Monocytes: 795 cells/uL (ref 200–950)
Basophils Absolute: 17 cells/uL (ref 0–200)
Basophils Relative: 0.3 %
Eosinophils Absolute: 58 cells/uL (ref 15–500)
Eosinophils Relative: 1 %
HCT: 41.4 % (ref 35.0–45.0)
Hemoglobin: 12.2 g/dL (ref 11.7–15.5)
Lymphs Abs: 2291 cells/uL (ref 850–3900)
MCH: 22.6 pg — ABNORMAL LOW (ref 27.0–33.0)
MCHC: 29.5 g/dL — ABNORMAL LOW (ref 32.0–36.0)
MCV: 76.7 fL — ABNORMAL LOW (ref 80.0–100.0)
Monocytes Relative: 13.7 %
Neutro Abs: 2639 cells/uL (ref 1500–7800)
Neutrophils Relative %: 45.5 %
Platelets: 110 10*3/uL — ABNORMAL LOW (ref 140–400)
RBC: 5.4 10*6/uL — ABNORMAL HIGH (ref 3.80–5.10)
RDW: 18.4 % — ABNORMAL HIGH (ref 11.0–15.0)
Total Lymphocyte: 39.5 %
WBC: 5.8 10*3/uL (ref 3.8–10.8)

## 2020-07-06 LAB — EXTRA SPECIMEN

## 2020-07-06 MED ORDER — TRANEXAMIC ACID 1000 MG/10ML IV SOLN
2000.0000 mg | INTRAVENOUS | Status: DC
  Filled 2020-07-06: qty 20

## 2020-07-06 NOTE — Progress Notes (Signed)
Spoke with Horton Finer on the phone at [phone]. He voiced understanding of patient's new arrival time of 56.

## 2020-07-06 NOTE — Anesthesia Preprocedure Evaluation (Addendum)
Anesthesia Evaluation  Patient identified by MRN, date of birth, ID band Patient awake    Reviewed: Allergy & Precautions, H&P , NPO status , Patient's Chart, lab work & pertinent test results  Airway Mallampati: III  TM Distance: >3 FB Neck ROM: Full    Dental no notable dental hx. (+) Teeth Intact, Dental Advisory Given   Pulmonary neg pulmonary ROS,    Pulmonary exam normal breath sounds clear to auscultation       Cardiovascular hypertension, Pt. on medications negative cardio ROS  + dysrhythmias Atrial Fibrillation  Rhythm:Regular Rate:Normal     Neuro/Psych negative neurological ROS  negative psych ROS   GI/Hepatic negative GI ROS, Neg liver ROS,   Endo/Other  negative endocrine ROSMorbid obesity  Renal/GU negative Renal ROS  negative genitourinary   Musculoskeletal  (+) Arthritis , Osteoarthritis,    Abdominal   Peds  Hematology negative hematology ROS (+)   Anesthesia Other Findings   Reproductive/Obstetrics negative OB ROS                           Anesthesia Physical Anesthesia Plan  ASA: III  Anesthesia Plan: Spinal   Post-op Pain Management:  Regional for Post-op pain   Induction: Intravenous  PONV Risk Score and Plan: 3 and Ondansetron, Dexamethasone and Midazolam  Airway Management Planned: Simple Face Mask  Additional Equipment:   Intra-op Plan:   Post-operative Plan:   Informed Consent: I have reviewed the patients History and Physical, chart, labs and discussed the procedure including the risks, benefits and alternatives for the proposed anesthesia with the patient or authorized representative who has indicated his/her understanding and acceptance.     Dental advisory given  Plan Discussed with: CRNA  Anesthesia Plan Comments: (See pended note by Antionette Poles, PA-C. Dr. August Saucer ordered repeat CBC which was done on 07/06/20 ~ 8:30 AM but has not resulted yet.  (Repeat labs was not done through PAT.))       Anesthesia Quick Evaluation

## 2020-07-07 ENCOUNTER — Encounter (HOSPITAL_COMMUNITY): Payer: Self-pay | Admitting: Orthopedic Surgery

## 2020-07-07 ENCOUNTER — Encounter (HOSPITAL_COMMUNITY)
Admission: RE | Disposition: A | Discharge status: Home Health | Payer: Self-pay | Source: Home / Self Care | Attending: Orthopedic Surgery

## 2020-07-07 ENCOUNTER — Other Ambulatory Visit: Payer: Self-pay

## 2020-07-07 ENCOUNTER — Inpatient Hospital Stay (HOSPITAL_COMMUNITY)
Admission: RE | Admit: 2020-07-07 | Discharge: 2020-07-11 | DRG: 470 | Disposition: A | Discharge status: Home Health | Payer: Self-pay | Attending: Orthopedic Surgery | Admitting: Orthopedic Surgery

## 2020-07-07 ENCOUNTER — Ambulatory Visit (HOSPITAL_COMMUNITY): Payer: Self-pay | Admitting: Anesthesiology

## 2020-07-07 ENCOUNTER — Ambulatory Visit (HOSPITAL_COMMUNITY): Payer: Self-pay | Admitting: Physician Assistant

## 2020-07-07 DIAGNOSIS — I482 Chronic atrial fibrillation, unspecified: Secondary | ICD-10-CM | POA: Diagnosis present

## 2020-07-07 DIAGNOSIS — Z96652 Presence of left artificial knee joint: Secondary | ICD-10-CM

## 2020-07-07 DIAGNOSIS — M1712 Unilateral primary osteoarthritis, left knee: Principal | ICD-10-CM | POA: Diagnosis present

## 2020-07-07 DIAGNOSIS — M79662 Pain in left lower leg: Secondary | ICD-10-CM | POA: Diagnosis not present

## 2020-07-07 DIAGNOSIS — Z6841 Body Mass Index (BMI) 40.0 and over, adult: Secondary | ICD-10-CM

## 2020-07-07 DIAGNOSIS — Z7901 Long term (current) use of anticoagulants: Secondary | ICD-10-CM

## 2020-07-07 DIAGNOSIS — Z79899 Other long term (current) drug therapy: Secondary | ICD-10-CM

## 2020-07-07 DIAGNOSIS — E119 Type 2 diabetes mellitus without complications: Secondary | ICD-10-CM | POA: Diagnosis present

## 2020-07-07 DIAGNOSIS — Z8616 Personal history of COVID-19: Secondary | ICD-10-CM

## 2020-07-07 DIAGNOSIS — Z96651 Presence of right artificial knee joint: Secondary | ICD-10-CM | POA: Diagnosis present

## 2020-07-07 DIAGNOSIS — I10 Essential (primary) hypertension: Secondary | ICD-10-CM | POA: Diagnosis present

## 2020-07-07 HISTORY — PX: TOTAL KNEE ARTHROPLASTY: SHX125

## 2020-07-07 SURGERY — ARTHROPLASTY, KNEE, TOTAL
Anesthesia: Spinal | Site: Knee | Laterality: Left

## 2020-07-07 MED ORDER — MORPHINE SULFATE (PF) 4 MG/ML IV SOLN
INTRAVENOUS | Status: DC | PRN
  Administered 2020-07-07: 8 mg via INTRAMUSCULAR

## 2020-07-07 MED ORDER — FENTANYL CITRATE (PF) 100 MCG/2ML IJ SOLN: 50.0000 ug | Freq: Once | INTRAMUSCULAR | Status: AC

## 2020-07-07 MED ORDER — BUPIVACAINE LIPOSOME 1.3 % IJ SUSP
INTRAMUSCULAR | Status: DC | PRN
  Administered 2020-07-07: 20 mL

## 2020-07-07 MED ORDER — TRANEXAMIC ACID-NACL 1000-0.7 MG/100ML-% IV SOLN
1000.0000 mg | INTRAVENOUS | Status: AC
  Administered 2020-07-07: 1000 mg via INTRAVENOUS

## 2020-07-07 MED ORDER — MIDAZOLAM HCL 2 MG/2ML IJ SOLN
INTRAMUSCULAR | Status: AC
  Filled 2020-07-07: qty 2

## 2020-07-07 MED ORDER — BUPIVACAINE LIPOSOME 1.3 % IJ SUSP
INTRAMUSCULAR | Status: AC
  Filled 2020-07-07: qty 20

## 2020-07-07 MED ORDER — PROPOFOL 10 MG/ML IV BOLUS
INTRAVENOUS | Status: AC
  Filled 2020-07-07: qty 20

## 2020-07-07 MED ORDER — CEFAZOLIN SODIUM-DEXTROSE 2-4 GM/100ML-% IV SOLN
INTRAVENOUS | Status: AC
  Filled 2020-07-07: qty 100

## 2020-07-07 MED ORDER — OXYCODONE HCL 5 MG PO TABS
5.0000 mg | ORAL_TABLET | ORAL | Status: DC | PRN
Start: 2020-07-07 — End: 2020-07-12
  Administered 2020-07-08 – 2020-07-11 (×12): 10 mg via ORAL
  Filled 2020-07-07 (×12): qty 2

## 2020-07-07 MED ORDER — PANTOPRAZOLE SODIUM 40 MG PO TBEC
40.0000 mg | DELAYED_RELEASE_TABLET | Freq: Every day | ORAL | Status: DC
  Administered 2020-07-07 – 2020-07-11 (×5): 40 mg via ORAL
  Filled 2020-07-07 (×4): qty 1

## 2020-07-07 MED ORDER — METHOCARBAMOL 1000 MG/10ML IJ SOLN: 500.0000 mg | Freq: Four times a day (QID) | INTRAVENOUS | Status: DC | PRN

## 2020-07-07 MED ORDER — BUPIVACAINE IN DEXTROSE 0.75-8.25 % IT SOLN
INTRATHECAL | Status: DC | PRN
  Administered 2020-07-07: 2 mL via INTRATHECAL

## 2020-07-07 MED ORDER — PROPOFOL 500 MG/50ML IV EMUL
INTRAVENOUS | Status: DC | PRN
  Administered 2020-07-07: 25 ug/kg/min via INTRAVENOUS

## 2020-07-07 MED ORDER — ACETAMINOPHEN 10 MG/ML IV SOLN
INTRAVENOUS | Status: DC | PRN
  Administered 2020-07-07: 1000 mg via INTRAVENOUS

## 2020-07-07 MED ORDER — BUPIVACAINE HCL (PF) 0.25 % IJ SOLN
INTRAMUSCULAR | Status: AC
  Filled 2020-07-07: qty 30

## 2020-07-07 MED ORDER — HYDROMORPHONE HCL 1 MG/ML IJ SOLN
0.5000 mg | INTRAMUSCULAR | Status: DC | PRN
Start: 2020-07-07 — End: 2020-07-12
  Administered 2020-07-08 – 2020-07-10 (×5): 0.5 mg via INTRAVENOUS
  Filled 2020-07-07 (×5): qty 0.5

## 2020-07-07 MED ORDER — FENTANYL CITRATE (PF) 250 MCG/5ML IJ SOLN
INTRAMUSCULAR | Status: AC
  Filled 2020-07-07: qty 5

## 2020-07-07 MED ORDER — VANCOMYCIN HCL 1000 MG IV SOLR
INTRAVENOUS | Status: AC
  Filled 2020-07-07: qty 1000

## 2020-07-07 MED ORDER — MIDAZOLAM HCL 2 MG/2ML IJ SOLN
INTRAMUSCULAR | Status: AC
  Administered 2020-07-07: 1 mg via INTRAVENOUS
  Filled 2020-07-07: qty 2

## 2020-07-07 MED ORDER — PROPOFOL 10 MG/ML IV BOLUS
INTRAVENOUS | Status: DC | PRN
  Administered 2020-07-07: 50 mg via INTRAVENOUS
  Administered 2020-07-07: 30 mg via INTRAVENOUS
  Administered 2020-07-07: 50 mg via INTRAVENOUS
  Administered 2020-07-07: 20 mg via INTRAVENOUS

## 2020-07-07 MED ORDER — SODIUM CHLORIDE 0.9% FLUSH
INTRAVENOUS | Status: DC | PRN
  Administered 2020-07-07: 20 mL

## 2020-07-07 MED ORDER — POVIDONE-IODINE 10 % EX SWAB: 2.0000 "application " | Freq: Once | CUTANEOUS | Status: DC

## 2020-07-07 MED ORDER — ONDANSETRON HCL 4 MG/2ML IJ SOLN
INTRAMUSCULAR | Status: DC | PRN
  Administered 2020-07-07: 4 mg via INTRAVENOUS

## 2020-07-07 MED ORDER — BUPIVACAINE HCL 0.25 % IJ SOLN
INTRAMUSCULAR | Status: DC | PRN
  Administered 2020-07-07: 20 mL
  Administered 2020-07-07: 10 mL

## 2020-07-07 MED ORDER — ACETAMINOPHEN 325 MG PO TABS
325.0000 mg | ORAL_TABLET | Freq: Four times a day (QID) | ORAL | Status: DC | PRN
  Administered 2020-07-09: 650 mg via ORAL
  Filled 2020-07-07: qty 2

## 2020-07-07 MED ORDER — TRANEXAMIC ACID 1000 MG/10ML IV SOLN
INTRAVENOUS | Status: DC | PRN
  Administered 2020-07-07: 2000 mg via TOPICAL

## 2020-07-07 MED ORDER — PHENOL 1.4 % MT LIQD: 1.0000 | OROMUCOSAL | Status: DC | PRN

## 2020-07-07 MED ORDER — LACTATED RINGERS IV SOLN: INTRAVENOUS | Status: DC

## 2020-07-07 MED ORDER — DILTIAZEM HCL ER COATED BEADS 120 MG PO CP24
120.0000 mg | ORAL_CAPSULE | Freq: Every day | ORAL | Status: DC
  Administered 2020-07-07 – 2020-07-11 (×5): 120 mg via ORAL
  Filled 2020-07-07 (×5): qty 1

## 2020-07-07 MED ORDER — VANCOMYCIN HCL 1000 MG IV SOLR
INTRAVENOUS | Status: DC | PRN
  Administered 2020-07-07: 1000 mg

## 2020-07-07 MED ORDER — APIXABAN 5 MG PO TABS
5.0000 mg | ORAL_TABLET | Freq: Two times a day (BID) | ORAL | Status: DC
  Administered 2020-07-08 – 2020-07-11 (×7): 5 mg via ORAL
  Filled 2020-07-07 (×7): qty 1

## 2020-07-07 MED ORDER — CHLORHEXIDINE GLUCONATE 0.12 % MT SOLN: 15.0000 mL | Freq: Once | OROMUCOSAL | Status: AC

## 2020-07-07 MED ORDER — ONDANSETRON HCL 4 MG/2ML IJ SOLN: 4.0000 mg | Freq: Four times a day (QID) | INTRAMUSCULAR | Status: DC | PRN

## 2020-07-07 MED ORDER — CLONIDINE HCL (ANALGESIA) 100 MCG/ML EP SOLN
EPIDURAL | Status: DC | PRN
  Administered 2020-07-07: 1 mL

## 2020-07-07 MED ORDER — LIDOCAINE HCL (PF) 2 % IJ SOLN
INTRAMUSCULAR | Status: DC | PRN
  Administered 2020-07-07: 20 mg via INTRADERMAL

## 2020-07-07 MED ORDER — ACETAMINOPHEN 10 MG/ML IV SOLN
INTRAVENOUS | Status: AC
  Filled 2020-07-07: qty 100

## 2020-07-07 MED ORDER — METOCLOPRAMIDE HCL 5 MG PO TABS
5.0000 mg | ORAL_TABLET | Freq: Three times a day (TID) | ORAL | Status: DC | PRN
Start: 2020-07-07 — End: 2020-07-12

## 2020-07-07 MED ORDER — CLONIDINE HCL (ANALGESIA) 100 MCG/ML EP SOLN
EPIDURAL | Status: AC
  Filled 2020-07-07: qty 10

## 2020-07-07 MED ORDER — CHLORHEXIDINE GLUCONATE 0.12 % MT SOLN
OROMUCOSAL | Status: AC
  Administered 2020-07-07: 15 mL via OROMUCOSAL
  Filled 2020-07-07: qty 15

## 2020-07-07 MED ORDER — MIDAZOLAM HCL 2 MG/2ML IJ SOLN: 1.0000 mg | Freq: Once | INTRAMUSCULAR | Status: AC

## 2020-07-07 MED ORDER — DOCUSATE SODIUM 100 MG PO CAPS
100.0000 mg | ORAL_CAPSULE | Freq: Two times a day (BID) | ORAL | Status: DC
  Administered 2020-07-07 – 2020-07-11 (×8): 100 mg via ORAL
  Filled 2020-07-07 (×8): qty 1

## 2020-07-07 MED ORDER — ONDANSETRON HCL 4 MG PO TABS
4.0000 mg | ORAL_TABLET | Freq: Four times a day (QID) | ORAL | Status: DC | PRN
  Administered 2020-07-10: 4 mg via ORAL
  Filled 2020-07-07 (×2): qty 1

## 2020-07-07 MED ORDER — DEXAMETHASONE SODIUM PHOSPHATE 10 MG/ML IJ SOLN
INTRAMUSCULAR | Status: DC | PRN
  Administered 2020-07-07: 5 mg via INTRAVENOUS

## 2020-07-07 MED ORDER — ALBUMIN HUMAN 5 % IV SOLN: INTRAVENOUS | Status: DC | PRN

## 2020-07-07 MED ORDER — ACETAMINOPHEN 500 MG PO TABS
1000.0000 mg | ORAL_TABLET | Freq: Four times a day (QID) | ORAL | Status: AC
  Administered 2020-07-07 – 2020-07-08 (×3): 1000 mg via ORAL
  Filled 2020-07-07 (×4): qty 2

## 2020-07-07 MED ORDER — METOCLOPRAMIDE HCL 5 MG/ML IJ SOLN: 5.0000 mg | Freq: Three times a day (TID) | INTRAMUSCULAR | Status: DC | PRN

## 2020-07-07 MED ORDER — PHENYLEPHRINE HCL-NACL 10-0.9 MG/250ML-% IV SOLN
INTRAVENOUS | Status: DC | PRN
  Administered 2020-07-07: 50 ug/min via INTRAVENOUS

## 2020-07-07 MED ORDER — AMLODIPINE BESYLATE 5 MG PO TABS
5.0000 mg | ORAL_TABLET | Freq: Every day | ORAL | Status: DC
  Administered 2020-07-07 – 2020-07-11 (×5): 5 mg via ORAL
  Filled 2020-07-07 (×5): qty 1

## 2020-07-07 MED ORDER — SODIUM CHLORIDE 0.9 % IR SOLN
Status: DC | PRN
  Administered 2020-07-07: 3000 mL

## 2020-07-07 MED ORDER — 0.9 % SODIUM CHLORIDE (POUR BTL) OPTIME
TOPICAL | Status: DC | PRN
  Administered 2020-07-07: 3000 mL

## 2020-07-07 MED ORDER — MENTHOL 3 MG MT LOZG: 1.0000 | LOZENGE | OROMUCOSAL | Status: DC | PRN

## 2020-07-07 MED ORDER — CEFAZOLIN SODIUM-DEXTROSE 2-4 GM/100ML-% IV SOLN
2.0000 g | INTRAVENOUS | Status: AC
  Administered 2020-07-07: 2 g via INTRAVENOUS

## 2020-07-07 MED ORDER — IRRISEPT - 450ML BOTTLE WITH 0.05% CHG IN STERILE WATER, USP 99.95% OPTIME
TOPICAL | Status: DC | PRN
  Administered 2020-07-07: 450 mL

## 2020-07-07 MED ORDER — MORPHINE SULFATE (PF) 4 MG/ML IV SOLN
INTRAVENOUS | Status: AC
  Filled 2020-07-07: qty 2

## 2020-07-07 MED ORDER — CEFAZOLIN SODIUM-DEXTROSE 2-4 GM/100ML-% IV SOLN
2.0000 g | Freq: Three times a day (TID) | INTRAVENOUS | Status: AC
  Administered 2020-07-07 – 2020-07-08 (×2): 2 g via INTRAVENOUS
  Filled 2020-07-07 (×2): qty 100

## 2020-07-07 MED ORDER — ORAL CARE MOUTH RINSE: 15.0000 mL | Freq: Once | OROMUCOSAL | Status: AC

## 2020-07-07 MED ORDER — METHOCARBAMOL 500 MG PO TABS
500.0000 mg | ORAL_TABLET | Freq: Four times a day (QID) | ORAL | Status: DC | PRN
  Administered 2020-07-08 – 2020-07-11 (×6): 500 mg via ORAL
  Filled 2020-07-07 (×6): qty 1

## 2020-07-07 MED ORDER — GLYCOPYRROLATE PF 0.2 MG/ML IJ SOSY
PREFILLED_SYRINGE | INTRAMUSCULAR | Status: DC | PRN
  Administered 2020-07-07: .2 mg via INTRAVENOUS

## 2020-07-07 MED ORDER — BUPIVACAINE-EPINEPHRINE (PF) 0.5% -1:200000 IJ SOLN
INTRAMUSCULAR | Status: DC | PRN
  Administered 2020-07-07: 20 mL via PERINEURAL

## 2020-07-07 MED ORDER — TRANEXAMIC ACID 1000 MG/10ML IV SOLN
2000.0000 mg | INTRAVENOUS | Status: DC
  Filled 2020-07-07: qty 20

## 2020-07-07 MED ORDER — GABAPENTIN 100 MG PO CAPS
100.0000 mg | ORAL_CAPSULE | Freq: Three times a day (TID) | ORAL | Status: DC
  Administered 2020-07-07 – 2020-07-11 (×13): 100 mg via ORAL
  Filled 2020-07-07 (×13): qty 1

## 2020-07-07 MED ORDER — FENTANYL CITRATE (PF) 100 MCG/2ML IJ SOLN
INTRAMUSCULAR | Status: AC
  Administered 2020-07-07: 50 ug via INTRAVENOUS
  Filled 2020-07-07: qty 2

## 2020-07-07 MED ORDER — POVIDONE-IODINE 7.5 % EX SOLN: Freq: Once | CUTANEOUS | Status: DC

## 2020-07-07 SURGICAL SUPPLY — 85 items
BAG DECANTER FOR FLEXI CONT (MISCELLANEOUS) ×3 IMPLANT
BANDAGE ESMARK 6X9 LF (GAUZE/BANDAGES/DRESSINGS) ×1 IMPLANT
BLADE SAG 18X100X1.27 (BLADE) ×3 IMPLANT
BLADE SAW RECIP 87.9 MT (BLADE) ×3 IMPLANT
BNDG COHESIVE 6X5 TAN STRL LF (GAUZE/BANDAGES/DRESSINGS) IMPLANT
BNDG ELASTIC 6X10 VLCR STRL LF (GAUZE/BANDAGES/DRESSINGS) ×3 IMPLANT
BNDG ELASTIC 6X15 VLCR STRL LF (GAUZE/BANDAGES/DRESSINGS) IMPLANT
BNDG ESMARK 6X9 LF (GAUZE/BANDAGES/DRESSINGS) ×3
BONE CEMENT HUM AFFIXUS (Cement) ×3 IMPLANT
BOWL SMART MIX CTS (DISPOSABLE) IMPLANT
CEMENT BONE HUM AFFIXUS (Cement) ×1 IMPLANT
CEMENT BONE R 1X40 (Cement) ×3 IMPLANT
CLOSURE WOUND 1/2 X4 (GAUZE/BANDAGES/DRESSINGS) ×1
CNTNR URN SCR LID CUP LEK RST (MISCELLANEOUS) ×1 IMPLANT
COMP FEM PERSONA STD SZ5 LT (Joint) ×3 IMPLANT
COMPONENT FEM PERSNA STD SZ5LT (Joint) ×1 IMPLANT
CONT SPEC 4OZ STRL OR WHT (MISCELLANEOUS) ×2
COOLER ICEMAN CLASSIC (MISCELLANEOUS) ×3 IMPLANT
COVER SURGICAL LIGHT HANDLE (MISCELLANEOUS) ×3 IMPLANT
COVER WAND RF STERILE (DRAPES) IMPLANT
CUFF TOURN SGL QUICK 34 (TOURNIQUET CUFF)
CUFF TOURN SGL QUICK 42 (TOURNIQUET CUFF) ×3 IMPLANT
CUFF TRNQT CYL 34X4.125X (TOURNIQUET CUFF) IMPLANT
DECANTER SPIKE VIAL GLASS SM (MISCELLANEOUS) IMPLANT
DRAPE INCISE IOBAN 66X45 STRL (DRAPES) IMPLANT
DRAPE ORTHO SPLIT 77X108 STRL (DRAPES) ×6
DRAPE SURG ORHT 6 SPLT 77X108 (DRAPES) ×3 IMPLANT
DRAPE U-SHAPE 47X51 STRL (DRAPES) ×3 IMPLANT
DRSG AQUACEL AG ADV 3.5X10 (GAUZE/BANDAGES/DRESSINGS) ×3 IMPLANT
DRSG AQUACEL AG ADV 3.5X14 (GAUZE/BANDAGES/DRESSINGS) ×3 IMPLANT
DURAPREP 26ML APPLICATOR (WOUND CARE) ×6 IMPLANT
ELECT CAUTERY BLADE 6.4 (BLADE) ×3 IMPLANT
ELECT REM PT RETURN 9FT ADLT (ELECTROSURGICAL) ×3
ELECTRODE REM PT RTRN 9FT ADLT (ELECTROSURGICAL) ×1 IMPLANT
GAUZE SPONGE 4X4 12PLY STRL (GAUZE/BANDAGES/DRESSINGS) ×3 IMPLANT
GLOVE ECLIPSE 7.0 STRL STRAW (GLOVE) ×3 IMPLANT
GLOVE ECLIPSE 8.0 STRL XLNG CF (GLOVE) ×3 IMPLANT
GLOVE SRG 8 PF TXTR STRL LF DI (GLOVE) ×1 IMPLANT
GLOVE SURG UNDER POLY LF SZ7 (GLOVE) ×3 IMPLANT
GLOVE SURG UNDER POLY LF SZ8 (GLOVE) ×2
GOWN STRL REUS W/ TWL LRG LVL3 (GOWN DISPOSABLE) ×3 IMPLANT
GOWN STRL REUS W/TWL LRG LVL3 (GOWN DISPOSABLE) ×6
HANDPIECE INTERPULSE COAX TIP (DISPOSABLE) ×2
HDLS TROCR DRIL PIN KNEE 75 (PIN) ×2
HOOD PEEL AWAY FLYTE STAYCOOL (MISCELLANEOUS) ×9 IMPLANT
IMMOBILIZER KNEE 20 (SOFTGOODS)
IMMOBILIZER KNEE 20 THIGH 36 (SOFTGOODS) IMPLANT
IMMOBILIZER KNEE 22 UNIV (SOFTGOODS) ×3 IMPLANT
IMMOBILIZER KNEE 24 THIGH 36 (MISCELLANEOUS) IMPLANT
IMMOBILIZER KNEE 24 UNIV (MISCELLANEOUS)
INSERT TIB BEAR EF/3-5 14 LT (Insert) ×3 IMPLANT
KIT BASIN OR (CUSTOM PROCEDURE TRAY) ×3 IMPLANT
KIT TURNOVER KIT B (KITS) ×3 IMPLANT
MANIFOLD NEPTUNE II (INSTRUMENTS) ×3 IMPLANT
NEEDLE 22X1 1/2 (OR ONLY) (NEEDLE) ×6 IMPLANT
NEEDLE SPNL 18GX3.5 QUINCKE PK (NEEDLE) ×3 IMPLANT
NS IRRIG 1000ML POUR BTL (IV SOLUTION) ×3 IMPLANT
PACK TOTAL JOINT (CUSTOM PROCEDURE TRAY) ×3 IMPLANT
PAD ARMBOARD 7.5X6 YLW CONV (MISCELLANEOUS) ×6 IMPLANT
PAD CAST 4YDX4 CTTN HI CHSV (CAST SUPPLIES) ×1 IMPLANT
PADDING CAST COTTON 4X4 STRL (CAST SUPPLIES) ×2
PADDING CAST COTTON 6X4 STRL (CAST SUPPLIES) ×6 IMPLANT
PIN DRILL HDLS TROCAR 75 4PK (PIN) ×1 IMPLANT
SCREW FEMALE HEX FIX 25X2.5 (ORTHOPEDIC DISPOSABLE SUPPLIES) ×3 IMPLANT
SET HNDPC FAN SPRY TIP SCT (DISPOSABLE) ×1 IMPLANT
STEM POLY PAT PLY 29M KNEE (Knees) ×3 IMPLANT
STEM TIB ST PERS 14+30 (Stem) ×3 IMPLANT
STEM TIBIA 5 DEG SZ E L KNEE (Knees) ×1 IMPLANT
STRIP CLOSURE SKIN 1/2X4 (GAUZE/BANDAGES/DRESSINGS) ×2 IMPLANT
SUCTION FRAZIER HANDLE 10FR (MISCELLANEOUS) ×2
SUCTION TUBE FRAZIER 10FR DISP (MISCELLANEOUS) ×1 IMPLANT
SUT MNCRL AB 3-0 PS2 18 (SUTURE) ×3 IMPLANT
SUT VIC AB 0 CT1 27 (SUTURE) ×8
SUT VIC AB 0 CT1 27XBRD ANBCTR (SUTURE) ×4 IMPLANT
SUT VIC AB 1 CT1 27 (SUTURE) ×16
SUT VIC AB 1 CT1 27XBRD ANBCTR (SUTURE) ×8 IMPLANT
SUT VIC AB 2-0 CT1 27 (SUTURE) ×8
SUT VIC AB 2-0 CT1 TAPERPNT 27 (SUTURE) ×4 IMPLANT
SYR 30ML LL (SYRINGE) ×9 IMPLANT
SYR TB 1ML LUER SLIP (SYRINGE) IMPLANT
TIBIA STEM 5 DEG SZ E L KNEE (Knees) ×3 IMPLANT
TOWEL GREEN STERILE (TOWEL DISPOSABLE) ×6 IMPLANT
TOWEL GREEN STERILE FF (TOWEL DISPOSABLE) ×6 IMPLANT
TRAY CATH 16FR W/PLASTIC CATH (SET/KITS/TRAYS/PACK) IMPLANT
WATER STERILE IRR 1000ML POUR (IV SOLUTION) IMPLANT

## 2020-07-07 NOTE — Anesthesia Procedure Notes (Signed)
Spinal  Start time: 07/07/2020 12:45 PM End time: 07/07/2020 12:48 PM Reason for block: surgical anesthesia Staffing Performed: anesthesiologist  Anesthesiologist: Shelton Silvas, MD Preanesthetic Checklist Completed: patient identified, IV checked, site marked, risks and benefits discussed, surgical consent, monitors and equipment checked, pre-op evaluation and timeout performed Spinal Block Patient position: sitting Prep: DuraPrep and site prepped and draped Location: L3-4 Injection technique: single-shot Needle Needle type: Pencan  Needle gauge: 24 G Needle length: 10 cm Needle insertion depth: 10 cm Additional Notes Patient tolerated well. No immediate complications.

## 2020-07-07 NOTE — Anesthesia Postprocedure Evaluation (Signed)
Anesthesia Post Note  Patient: [name] [name]  Procedure(s) Performed: LEFT TOTAL KNEE ARTHROPLASTY-CEMENTED (Left Knee)     Patient location during evaluation: PACU Anesthesia Type: Spinal and Regional Level of consciousness: oriented and awake and alert Pain management: pain level controlled Vital Signs Assessment: post-procedure vital signs reviewed and stable Respiratory status: spontaneous breathing and respiratory function stable Cardiovascular status: blood pressure returned to baseline and stable Postop Assessment: no headache, no backache, no apparent nausea or vomiting, spinal receding and patient able to bend at knees Anesthetic complications: no   No complications documented.  Last Vitals:  Vitals:   07/07/20 1655 07/07/20 1700  BP: 115/62   Pulse: (!) 44   Resp: 18   Temp:  37.5 C  SpO2: 97%     Last Pain:  Vitals:   07/07/20 1640  TempSrc:   PainSc: 0-No pain                 Ravin Denardo,W. EDMOND

## 2020-07-07 NOTE — Brief Op Note (Signed)
   07/07/2020  4:05 PM  PATIENT:  Avelina Laine  70 y.o. female  PRE-OPERATIVE DIAGNOSIS:  left knee osteoarthritis  POST-OPERATIVE DIAGNOSIS:  left knee osteoarthritis  PROCEDURE:  Procedure(s): LEFT TOTAL KNEE ARTHROPLASTY-CEMENTED  SURGEON:  Surgeon(s): Cammy Copa, MD  ASSISTANT: magnant pa  ANESTHESIA:   spinal  EBL: 100 ml    Total I/O In: 1850 [I.V.:1300; IV Piggyback:550] Out: 1300 [Urine:1200; Blood:100]  BLOOD ADMINISTERED: none  DRAINS: none   LOCAL MEDICATIONS USED:  Marcaine mso4 clonidine exparel vanc powder  SPECIMEN:  No Specimen  COUNTS:  YES  TOURNIQUET:   Total Tourniquet Time Documented: Thigh (Left) - 105 minutes Total: Thigh (Left) - 105 minutes   DICTATION: .Other Dictation: Dictation Number [id number]  PLAN OF CARE: Admit for overnight observation  PATIENT DISPOSITION:  PACU - hemodynamically stable

## 2020-07-07 NOTE — Anesthesia Procedure Notes (Signed)
Procedure Name: MAC Date/Time: 07/07/2020 12:27 PM Performed by: Michele Rockers, CRNA Pre-anesthesia Checklist: Patient identified, Emergency Drugs available, Suction available, Timeout performed and Patient being monitored Patient Re-evaluated:Patient Re-evaluated prior to induction Oxygen Delivery Method: Simple face mask

## 2020-07-07 NOTE — Anesthesia Procedure Notes (Signed)
Anesthesia Regional Block: Adductor canal block   Pre-Anesthetic Checklist: ,, timeout performed, Correct Patient, Correct Site, Correct Laterality, Correct Procedure, Correct Position, site marked, Risks and benefits discussed, pre-op evaluation,  At surgeon's request and post-op pain management  Laterality: Left  Prep: Maximum Sterile Barrier Precautions used, chloraprep       Needles:  Injection technique: Single-shot  Needle Type: Echogenic Stimulator Needle     Needle Length: 9cm  Needle Gauge: 21     Additional Needles:   Procedures:,,,, ultrasound used (permanent image in chart),,,,  Narrative:  Start time: 07/07/2020 10:03 AM End time: 07/07/2020 10:13 AM Injection made incrementally with aspirations every 5 mL.  Performed by: Personally  Anesthesiologist: Gaynelle Adu, MD  Additional Notes: 2% Lidocaine skin wheel.

## 2020-07-07 NOTE — Progress Notes (Signed)
Bedside shirt report complete. Received patient awake,alert/orientedx4 and able to verbalize needs. NAD noted; respirations easy on 2L O2 via nasal cannula. Movement/sensation to all extremities noted. Dressing to L knee c/d/i; iceman in place. SCDs on. Whiteboard updated. All safety measures in place and personal belongings within reach.

## 2020-07-07 NOTE — H&P (Signed)
TOTAL KNEE ADMISSION H&P  Patient is being admitted for left total knee arthroplasty.  Subjective:  Chief Complaint:left knee pain.  HPI: [name] [name], 70 y.o. female, has a history of pain and functional disability in the left knee due to arthritis and has failed non-surgical conservative treatments for greater than 12 weeks to includeNSAID's and/or analgesics, corticosteriod injections, flexibility and strengthening excercises, use of assistive devices and activity modification.  Onset of symptoms was gradual, starting >10 years ago with gradually worsening course since that time. The patient noted no past surgery on the left knee(s).  Patient currently rates pain in the left knee(s) at 9 out of 10 with activity. Patient has night pain, worsening of pain with activity and weight bearing, pain that interferes with activities of daily living, pain with passive range of motion, crepitus and joint swelling.  Patient has evidence of subchondral cysts, subchondral sclerosis, periarticular osteophytes, joint subluxation and joint space narrowing by imaging studies. This patient has had A good result with right total knee replacement done previously. There is no active infection.  Patient Active Problem List   Diagnosis Date Noted  . Conjunctival hemorrhage of right eye 03/25/2020  . Chronic anticoagulation 03/25/2020  . 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 S/P knee surgery 12/03/2019  . 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 Chronic atrial fibrillation (HCC) 02/26/2019  . Essential hypertension, benign 05/03/2018  . Knee osteoarthritis 10/24/2017   Past Medical History:  Diagnosis Date  . Acute kidney injury (HCC) 03/02/2019  . Acute respiratory failure with hypoxia (HCC) 02/22/2019  . Atrial fibrillation with RVR (HCC) 02/26/2019  . COVID-19 02/22/2019  . Dysrhythmia    para. atrial fibrillation  . Flu-like symptoms 02/22/2019  . Hypertension   . Knee  osteoarthritis 10/24/2017  . Pneumonia     Past Surgical History:  Procedure Laterality Date  . NO PAST SURGERIES    . TOTAL KNEE ARTHROPLASTY Right 12/03/2019   Procedure: RIGHT TOTAL KNEE ARTHROPLASTY;  Surgeon: Cammy Copa, MD;  Location[facility] OR;  Service: Orthopedics;  Laterality: Right;    Current Facility-Administered Medications  Medication Dose Route Frequency Provider Last Rate Last Admin  . ceFAZolin (ANCEF) 2-4 GM/100ML-% IVPB           . ceFAZolin (ANCEF) IVPB 2g/100 mL premix  2 g Intravenous On Call to OR Magnant, Joycie Peek, PA-C      . lactated ringers infusion   Intravenous Continuous Beryle Lathe, MD      . lactated ringers infusion   Intravenous Continuous Gaynelle Adu, MD      . povidone-iodine (BETADINE) 7.5 % scrub   Topical Once Magnant, Charles L, PA-C      . povidone-iodine 10 % swab 2 application  2 application Topical Once Magnant, Charles L, PA-C      . povidone-iodine 10 % swab 2 application  2 application Topical Once Magnant, Charles L, PA-C      . tranexamic acid (CYKLOKAPRON) 2,000 mg in sodium chloride 0.9 % 50 mL Topical Application  2,000 mg Topical To OR Cammy Copa, MD      . tranexamic acid (CYKLOKAPRON) IVPB 1,000 mg  1,000 mg Intravenous To OR Magnant, Charles L, PA-C       Facility-Administered Medications Ordered in Other Encounters  Medication Dose Route Frequency Provider Last Rate Last Admin  . bupivacaine-epinephrine (MARCAINE W/ EPI) 0.5% -1:200000 injection   Peri-NEURAL Anesthesia Intra-op Gaynelle Adu, MD   20 mL  at 07/07/20 1013   No Known Allergies  Social History   Tobacco Use  . Smoking status: Never Smoker  . Smokeless tobacco: Never Used  Substance Use Topics  . Alcohol use: Never    Family History  Family history unknown: Yes     Review of Systems  Musculoskeletal: Positive for arthralgias.  All other systems reviewed and are negative.   Objective:  Physical Exam Vitals reviewed.   HENT:     Head: Normocephalic.     Nose: Nose normal.     Mouth/Throat:     Mouth: Mucous membranes are moist.  Eyes:     Pupils: Pupils are equal, round, and reactive to light.  Cardiovascular:     Rate and Rhythm: Normal rate.     Pulses: Normal pulses.  Pulmonary:     Effort: Pulmonary effort is normal.  Abdominal:     General: Abdomen is flat.  Musculoskeletal:     Cervical back: Normal range of motion.  Skin:    General: Skin is warm.     Capillary Refill: Capillary refill takes less than 2 seconds.  Neurological:     General: No focal deficit present.     Mental Status: She is alert.  Psychiatric:        Mood and Affect: Mood normal.   Exam: Ortho exam demonstrates varus alignment left lower extremity.  Pedal pulses 1+ out of 4 bilaterally.  Range of motion is 5 to 90 degrees on that left knee.  Extensor mechanism is intact.  No groin pain with internal or external rotation of the leg.  Very mild edema present in that left lower extremity.  No calf tenderness negative Homans.  Vital signs in last 24 hours: Temp:  [98.9 F (37.2 C)] 98.9 F (37.2 C) (06/07 0903) Pulse Rate:  [67-74] 67 (06/07 1020) Resp:  [17-18] 18 (06/07 1020) BP: (149-180)/(74-89) 149/74 (06/07 1020) SpO2:  [98 %-100 %] 100 % (06/07 1020) Weight:  [103.9 kg] 103.9 kg (06/07 0903)  Labs:   Estimated body mass index is 40.58 kg/m as calculated from the following:   Height as of this encounter: 5\' 3"  (1.6 m).   Weight as of this encounter: 103.9 kg.   Imaging Review Plain radiographs demonstrate severe degenerative joint disease of the left knee(s). The overall alignment issignificant varus. The bone quality appears to be good for age and reported activity level.      Assessment/Plan:  End stage arthritis, left knee   The patient history, physical examination, clinical judgment of the provider and imaging studies are consistent with end stage degenerative joint disease of the left  knee(s) and total knee arthroplasty is deemed medically necessary. The treatment options including medical management, injection therapy arthroscopy and arthroplasty were discussed at length. The risks and benefits of total knee arthroplasty were presented and reviewed. The risks due to aseptic loosening, infection, stiffness, patella tracking problems, thromboembolic complications and other imponderables were discussed. The patient acknowledged the explanation, agreed to proceed with the plan and consent was signed. Patient is being admitted for inpatient treatment for surgery, pain control, PT, OT, prophylactic antibiotics, VTE prophylaxis, progressive ambulation and ADL's and discharge planning. The patient is planning to be discharged to skilled nursing facility     Patient's anticipated LOS is less than 2 midnights, meeting these requirements: - Younger than 68 - Lives within 1 hour of care - Has a competent adult at home to recover with post-op recover - NO history of  -  Chronic pain requiring opiods  - Diabetes  - Coronary Artery Disease  - Heart failure  - Heart attack  - Stroke  - DVT/VTE  - Cardiac arrhythmia  - Respiratory Failure/COPD  - Renal failure  - Anemia  - Advanced Liver disease

## 2020-07-07 NOTE — Transfer of Care (Signed)
Immediate Anesthesia Transfer of Care Note  Patient: [name] [name]  Procedure(s) Performed: LEFT TOTAL KNEE ARTHROPLASTY-CEMENTED (Left Knee)  Patient Location: PACU  Anesthesia Type:Spinal  Level of Consciousness: awake, alert  and patient cooperative  Airway & Oxygen Therapy: Patient Spontanous Breathing and Patient connected to face mask oxygen  Post-op Assessment: Report given to RN and Post -op Vital signs reviewed and stable  Post vital signs: Reviewed  Last Vitals:  Vitals Value Taken Time  BP    Temp    Pulse    Resp    SpO2      Last Pain:  Vitals:   07/07/20 0942  TempSrc:   PainSc: 0-No pain      Patients Stated Pain Goal: 6 (07/07/20 0942)  Complications: No complications documented.

## 2020-07-07 NOTE — Progress Notes (Signed)
Pt arrived to room from PACU accompanied by staff with interpreter,Pt is a french speaking, surgical site to left lower ext CDI with ace wrap/ice man. Alert/oriented in no apparent distress. Menu has been provided. Hospital valuables policy has been discussed with interpreter. No complaints. Bed in lowest position with 3 side rails up, call bell/room phone within reach and all wheels locked.

## 2020-07-07 NOTE — Progress Notes (Signed)
Orthopedic Tech Progress Note Patient Details:  [name] [name] [birth year] [id number]  CPM Left Knee CPM Left Knee: On Left Knee Flexion (Degrees): 10 Left Knee Extension (Degrees): 40  Post Interventions Patient Tolerated: Well Instructions Provided: Care of device Ortho Devices Type of Ortho Device: CPM padding,Bone foam zero knee Ortho Device/Splint Location: LLE Ortho Device/Splint Interventions: Ordered,Application,Adjustment   Post Interventions Patient Tolerated: Well Instructions Provided: Care of device   Donald Pore 07/07/2020, 6:30 PM

## 2020-07-07 NOTE — Progress Notes (Signed)
CPM and bone foam ordered and will be delivered to 5N room 8.

## 2020-07-08 LAB — CBC
HCT: 38.7 % (ref 36.0–46.0)
Hemoglobin: 12.5 g/dL (ref 12.0–15.0)
MCH: 23.2 pg — ABNORMAL LOW (ref 26.0–34.0)
MCHC: 32.3 g/dL (ref 30.0–36.0)
MCV: 71.8 fL — ABNORMAL LOW (ref 80.0–100.0)
Platelets: UNDETERMINED 10*3/uL (ref 150–400)
RBC: 5.39 MIL/uL — ABNORMAL HIGH (ref 3.87–5.11)
RDW: 19.6 % — ABNORMAL HIGH (ref 11.5–15.5)
WBC: 9.1 10*3/uL (ref 4.0–10.5)
nRBC: 0 % (ref 0.0–0.2)

## 2020-07-08 NOTE — Progress Notes (Signed)
Physical Therapy Treatment Patient Details Name: [name] [name] MRN: [id number] DOB: 25-Feb-1950 Today's Date: 07/08/2020    History of Present Illness 70 yo female s/p L TKR on 07/07/20. PMH includes R TKR 12/2019, DMII, afib, HTN.    PT Comments    Pt complaining of severe L knee pain and nausea, but agreeable to second PT session. Pt ambulatory for short room distance with overall min-max assist, states she must stop due to shaking from pain/nausea. Pt tolerating early stage TKR exercises, but complaining of pain throughout. PT to continue to follow.    Follow Up Recommendations  Home health PT;Supervision for mobility/OOB     Equipment Recommendations  Other (comment) (potentially will need wheelchair if mobility remains limited)    Recommendations for Other Services       Precautions / Restrictions Precautions Precautions: Fall Restrictions Weight Bearing Restrictions: No Other Position/Activity Restrictions: WBAT    Mobility  Bed Mobility Overal bed mobility: Needs Assistance Bed Mobility: Supine to Sit;Sit to Supine       Sit to supine: Min assist;HOB elevated   General bed mobility comments: Min assist for trunk and LE management, boost assist needed upon return to supine.    Transfers Overall transfer level: Needs assistance Equipment used: Rolling walker (2 wheeled) Transfers: Sit to/from Stand Sit to Stand: Max assist         General transfer comment: max assist for power up, rise, steady, verbal cuing for hand placement when rising/sitting. Pt with very uncontrolled descent onto bed from stand>sit, cues for slowing down.  Ambulation/Gait Ambulation/Gait assistance: Min assist Gait Distance (Feet): 8 Feet Assistive device: Rolling walker (2 wheeled) Gait Pattern/deviations: Step-to pattern;Antalgic;Trunk flexed;Decreased stance time - left Gait velocity: decr   General Gait Details: min assist to steady, navigate RW. cuing for sequencing,  placement in RW.   Stairs             Wheelchair Mobility    Modified Rankin (Stroke Patients Only)       Balance Overall balance assessment: Needs assistance Sitting-balance support: No upper extremity supported;Feet supported Sitting balance-Leahy Scale: Fair     Standing balance support: Bilateral upper extremity supported;During functional activity Standing balance-Leahy Scale: Poor                              Cognition Arousal/Alertness: Awake/alert Behavior During Therapy: WFL for tasks assessed/performed Overall Cognitive Status: Within Functional Limits for tasks assessed                                        Exercises Total Joint Exercises Heel Slides: AAROM;Left;Supine;10 reps Hip ABduction/ADduction: AAROM;Left;10 reps;Supine    General Comments        Pertinent Vitals/Pain Pain Assessment: 0-10 Pain Score: 10-Worst pain ever Pain Location: L knee Pain Descriptors / Indicators: Sore;Discomfort;Grimacing Pain Intervention(s): Limited activity within patient's tolerance;Monitored during session;Repositioned    Home Living                      Prior Function            PT Goals (current goals can now be found in the care plan section) Acute Rehab PT Goals Patient Stated Goal: go home PT Goal Formulation: With patient Time For Goal Achievement: 07/22/20 Potential to Achieve Goals: Good Progress towards PT goals: Progressing toward  goals    Frequency    7X/week      PT Plan      Co-evaluation              AM-PAC PT "6 Clicks" Mobility   Outcome Measure  Help needed turning from your back to your side while in a flat bed without using bedrails?: A Little Help needed moving from lying on your back to sitting on the side of a flat bed without using bedrails?: A Little Help needed moving to and from a bed to a chair (including a wheelchair)?: A Lot Help needed standing up from a chair  using your arms (e.g., wheelchair or bedside chair)?: A Lot Help needed to walk in hospital room?: A Lot Help needed climbing 3-5 steps with a railing? : Total 6 Click Score: 13    End of Session Equipment Utilized During Treatment: Gait belt Activity Tolerance: Patient limited by pain Patient left: with call bell/phone within reach;in bed;with bed alarm set;Other (comment) (ice machine donned to L knee) Nurse Communication: Mobility status PT Visit Diagnosis: Difficulty in walking, not elsewhere classified (R26.2);Other abnormalities of gait and mobility (R26.89)     Time: 7846-9629 PT Time Calculation (min) (ACUTE ONLY): 30 min  Charges:  $Gait Training: 8-22 mins $Therapeutic Activity: 8-22 mins                     Marye Round, PT DPT Acute Rehabilitation Services Pager [phone]  Office [phone]   [name] 07/08/2020, 4:58 PM

## 2020-07-08 NOTE — Progress Notes (Signed)
Patient is a 70 year old female who presents POD1 s/p left total knee arthroplasty.  She does not speak English so interpreter (husband and friend) was utilized to take history this morning.  She is doing very well this morning.  She only complains of mild knee pain.  No calf pain, shortness of breath, chest pain.  No calf tenderness.  Able to dorsiflex and plantarflex left ankle.  Unable to perform straight leg raise at this time.  Dressing clean, dry, intact.  Plan for patient to weight-bear as tolerated on the operative extremity with a walker.  She will work with physical therapy this morning and this afternoon.  If she does well and she has no dizziness or any concerns by therapy, plan for discharge home later today.  This was discussed with her husband.  They agree with plan.  She did have some dizziness in her last hospitalization and had to stay for 2 to 3 days.

## 2020-07-08 NOTE — Discharge Instructions (Signed)

## 2020-07-08 NOTE — Op Note (Signed)
NAME: [name] [name], [name] [name] MEDICAL RECORD NO: [id number] ACCOUNT NO: [id number] DATE OF BIRTH: 02-18-1950 FACILITY: [facility] LOCATION: [facility] PHYSICIAN: Graylin Shiver. August Saucer, MD  Operative Report   DATE OF PROCEDURE: 07/07/2020  PREOPERATIVE DIAGNOSIS:  Left knee arthritis.  POSTOPERATIVE DIAGNOSIS:  Left knee arthritis.  PROCEDURE:  Left total knee replacement utilizing Biomet cemented components, posterior cruciate sacrificing size 5 femur PCS implant with 14 polyethylene posterior cruciate stabilizing polyethylene, size E tibia with 50 mm stem cemented and 29 mm 3 peg  cemented patella.  SURGEON:  Cammy Copa, MD  ASSISTANT:  Karenann Cai.  INDICATIONS:  The patient is a 70 year old patient with left knee pain refractory to nonoperative management, who presents for operative management after explanation of risks and benefits.  DESCRIPTION OF PROCEDURE:  The patient was brought to the operating room where spinal anesthetic was induced.  Preoperative antibiotics administered.  Timeout was called.  Left leg prescrubbed with alcohol and Betadine and allowed to air dry.  Prepped  with DuraPrep solution and draped in a sterile manner.  Timeout was called.  The leg was elevated and exsanguinated with the Esmarch wrap.  Tourniquet was inflated.  Anterior approach to knee was made.  Skin and subcutaneous tissue were sharply divided.   IrriSept solution was utilized at this point, arthrotomy was made medially and marked with #1 Vicryl suture.  Lateral patellofemoral ligament was released.  Extensive medial soft tissue release was performed off the tibial side due to the patient's  preoperative varus deformity which was severe.  Soft tissue removed from the anterior distal femur.  At this time, osteophytes were removed, anterior horn of the lateral meniscus was removed, ACL released.  Significant wear present on that medial tibial  plateau as well as in all three compartments of the knee.   Intramedullary alignment was then used to make a cut perpendicular to the tibial axis 0 degrees for the PCS implant.  Approximately 1-2 mm cut made off the most affected medial tibial plateau,  which gave about a 12 mm cut off the lateral tibial plateau.  Cut was made.  Bone quality was reasonable.  Intramedullary alignment then used to cut the femur in 3 degrees of valgus.  The 9 mm resection was made and this allowed good spacing with a 10  and 12 mm spacer.  Good collateral ligament stability was present with those spacers.  The femur was then sized to a size 5.  Rotation was made parallel to the resected tibia for rectangular symmetric flexion and extension gaps.  Anterior, posterior and  chamfer cuts were made.  Box cut was performed.  Trial components placed on both the femur and the tibia.  Femur was size 5, tibia was size E.  Correct rotation with the middle of the tibial plateau pointing towards the medial third of the tibial  tubercle.  Next, with the trial components in position, a 14 spacer was tried.  Patella was then cut down from 23 to 15 mm and a 3-peg patella trial was placed.  The patient had full extension with about 2-3 degrees of hyperextension, full flexion with  excellent patellar tracking using no thumbs technique.  Stability was excellent as would be expected from a posterior cruciate stabilized implant.  Trial components were removed.  Tibia keel punch was performed.  Bone plug spacer was placed in the tibia.   Next, thorough irrigation was performed.  Capsule was anesthetized using Marcaine, saline and Exparel.  Thorough irrigation was performed and a  TXA sponge was allowed to sit with the IrriSept solution for 3 minutes in the knee.  This was removed and  the components were cemented into position with excess cement removed.  Next, tourniquet was released, bleeding points encountered were controlled using electrocautery.  Pouring irrigation was then utilized.  Same stability and  range of motion parameters  were maintained.  Alignment looked good.  At this time, the arthrotomy was closed using #1 Vicryl suture.  The IrriSept solution and vancomycin powder placed into that arthrotomy through the last 2 cm to be closed through the quad tendon.  Next,  IrriSept solution and vancomycin powder placed and then the arthrotomy was closed.  Solution of Marcaine, morphine, clonidine then injected into the knee.  Rest of the closure was performed using 0 Vicryl suture, 2-0 Vicryl suture and 3-0 Monocryl.   Steri-Strips and Aquacel dressing placed.  Bulky dressing and knee immobilizer placed.  The patient tolerated the procedure well without immediate complications and was transferred to the recovery room in stable condition.  Luke's assistance was required  at all times from retraction, opening and closing, mobilization of tissue.  His assistance was a medical necessity.   SHW D: 07/07/2020 4:12:38 pm T: 07/07/2020 9:23:00 pm  JOB: [id number]/ [id number]

## 2020-07-08 NOTE — Evaluation (Signed)
Physical Therapy Evaluation Patient Details Name: [name] [name] MRN: [id number] DOB: Jan 04, 1951 Today's Date: 07/08/2020   History of Present Illness  70 yo female s/p L TKR on 07/07/20. PMH includes R TKR 12/2019, DMII, afib, HTN.  Clinical Impression  Pt presents with generalized weakness, post-operative L knee weakness and decreased ROM, L knee pain, difficulty performing mobility tasks, and impaired activity tolerance vs baseline. Pt to benefit from acute PT to address deficits. Pt requiring mod assist for bed mobility and transfer OOB to recliner, pt exhibiting difficulty lifting LLE due to pain. Pt instructed to perform ankle pumps to improve circulation, to pt's tolerance and limited by pain. PT to progress mobility as tolerated, and will continue to follow acutely.    Of note, + min emesis post-transfer, nausea and emesis subsided with rest.     Follow Up Recommendations Home health PT;Supervision for mobility/OOB    Equipment Recommendations  Other (comment) (potentially will need wheelchair if mobility remains limited)    Recommendations for Other Services       Precautions / Restrictions Precautions Precautions: Fall Restrictions Weight Bearing Restrictions: No Other Position/Activity Restrictions: WBAT      Mobility  Bed Mobility Overal bed mobility: Needs Assistance Bed Mobility: Supine to Sit     Supine to sit: Min assist;HOB elevated     General bed mobility comments: Min assist for trunk elevation, LLE translation and lowering over EOB.    Transfers Overall transfer level: Needs assistance Equipment used: Rolling walker (2 wheeled) Transfers: Sit to/from [employer] Sit to Stand: Mod assist Stand pivot transfers: Mod assist       General transfer comment: Mod assist for power up, rise, steady, and pivot towards pt R to recliner. Further mobility limited by severe L knee pain.  Ambulation/Gait                Stairs             Wheelchair Mobility    Modified Rankin (Stroke Patients Only)       Balance Overall balance assessment: Needs assistance Sitting-balance support: No upper extremity supported;Feet supported Sitting balance-Leahy Scale: Fair     Standing balance support: Bilateral upper extremity supported;During functional activity Standing balance-Leahy Scale: Poor                               Pertinent Vitals/Pain Pain Assessment: Faces Faces Pain Scale: Hurts even more Pain Location: L knee Pain Descriptors / Indicators: Sore;Discomfort;Grimacing Pain Intervention(s): Limited activity within patient's tolerance;Monitored during session;Repositioned    Home Living Family/patient expects to be discharged to:: Private residence Living Arrangements: Spouse/significant other;Other relatives;Non-relatives/Friends Available Help at Discharge: Friend(s);Family Type of Home: House Home Access: Level entry     Home Layout: Two level Home Equipment: Walker - 2 wheels;Cane - single point;Bedside commode      Prior Function Level of Independence: Independent with assistive device(s)         Comments: pt reports using cane vs RW, pt did not differentiate when asked clarification questions     Hand Dominance   Dominant Hand: Right    Extremity/Trunk Assessment   Upper Extremity Assessment Upper Extremity Assessment: Defer to OT evaluation    Lower Extremity Assessment Lower Extremity Assessment: Generalized weakness;LLE deficits/detail LLE Deficits / Details: anticipated post-operative weakness; able to perform ankle pumps, quad set, heel slide to 35* in supine limited by pain and stiffness LLE: Unable to fully  assess due to pain    Cervical / Trunk Assessment Cervical / Trunk Assessment: Normal  Communication   Communication: Prefers language other than [place];Other (comment) (french interpreter, speaks Lingala: Sport and exercise psychologist [phone])   Cognition Arousal/Alertness: Awake/alert Behavior During Therapy: WFL for tasks assessed/performed Overall Cognitive Status: Within Functional Limits for tasks assessed                                 General Comments: has some difficulty answering questions during session, as if she does not understand question. Suspect due to pt dialect and periods of miscommunication between pt/interpreter      General Comments General comments (skin integrity, edema, etc.): Pt with + gagging and min emesis post-transfer, settled with rest    Exercises Total Joint Exercises Ankle Circles/Pumps: AROM;Both;10 reps;Seated Heel Slides: AAROM;Left;5 reps;Supine Goniometric ROM: 5-35* knee ext-flex in supine   Assessment/Plan    PT Assessment Patient needs continued PT services  PT Problem List Decreased strength;Decreased mobility;Decreased range of motion;Decreased safety awareness;Decreased activity tolerance;Decreased balance;Decreased knowledge of use of DME;Pain       PT Treatment Interventions DME instruction;Therapeutic activities;Gait training;Therapeutic exercise;Patient/family education;Balance training;Functional mobility training;Neuromuscular re-education    PT Goals (Current goals can be found in the Care Plan section)  Acute Rehab PT Goals Patient Stated Goal: go home PT Goal Formulation: With patient Time For Goal Achievement: 07/22/20 Potential to Achieve Goals: Good    Frequency 7X/week   Barriers to discharge        Co-evaluation               AM-PAC PT "6 Clicks" Mobility  Outcome Measure Help needed turning from your back to your side while in a flat bed without using bedrails?: A Little Help needed moving from lying on your back to sitting on the side of a flat bed without using bedrails?: A Little Help needed moving to and from a bed to a chair (including a wheelchair)?: A Lot Help needed standing up from a chair using your arms (e.g.,  wheelchair or bedside chair)?: A Lot Help needed to walk in hospital room?: A Lot Help needed climbing 3-5 steps with a railing? : Total 6 Click Score: 13    End of Session Equipment Utilized During Treatment: Gait belt Activity Tolerance: Patient limited by pain Patient left: in chair;with call bell/phone within reach;with chair alarm set Nurse Communication: Mobility status PT Visit Diagnosis: Difficulty in walking, not elsewhere classified (R26.2);Other abnormalities of gait and mobility (R26.89)    Time: 4580-9983 PT Time Calculation (min) (ACUTE ONLY): 39 min   Charges:   PT Evaluation $PT Eval Low Complexity: 1 Low PT Treatments $Therapeutic Activity: 8-22 mins       Marye Round, PT DPT Acute Rehabilitation Services Pager [phone]  Office [phone]  [name] [name] 07/08/2020, 11:38 AM

## 2020-07-09 ENCOUNTER — Encounter (HOSPITAL_COMMUNITY): Payer: Self-pay | Admitting: Orthopedic Surgery

## 2020-07-09 LAB — CBC
HCT: 32.7 % — ABNORMAL LOW (ref 36.0–46.0)
Hemoglobin: 10.7 g/dL — ABNORMAL LOW (ref 12.0–15.0)
MCH: 23.2 pg — ABNORMAL LOW (ref 26.0–34.0)
MCHC: 32.7 g/dL (ref 30.0–36.0)
MCV: 70.9 fL — ABNORMAL LOW (ref 80.0–100.0)
Platelets: 123 10*3/uL — ABNORMAL LOW (ref 150–400)
RBC: 4.61 MIL/uL (ref 3.87–5.11)
RDW: 19 % — ABNORMAL HIGH (ref 11.5–15.5)
WBC: 13.9 10*3/uL — ABNORMAL HIGH (ref 4.0–10.5)
nRBC: 0 % (ref 0.0–0.2)

## 2020-07-09 NOTE — Progress Notes (Signed)
Physical Therapy Treatment Patient Details Name: [name] [name] MRN: [id number] DOB: May 24, 1950 Today's Date: 07/09/2020    History of Present Illness 70 yo female s/p L TKR on 07/07/20. PMH includes R TKR 12/2019, DMII, afib, HTN.    PT Comments    Pt continues to improve with ambulation and OOB activities. Increased time spent discussing pain and d/c plan because pt will need S-min A upon discharge. Pt with improved ambulation tolerance with abillity to ambulate 30 ft x 2 with RW and min guard. Pt will continue to benefit from skilled PT to address deficits in balance, strength, coordination, ROM, pain, gait and endurance to maximize independence withf unctional mobility prior to discharge.     Follow Up Recommendations  Home health PT;Supervision for mobility/OOB     Equipment Recommendations  Rolling walker with 5" wheels    Recommendations for Other Services       Precautions / Restrictions Precautions Precautions: Fall Precaution Comments: monitor O2 Restrictions Weight Bearing Restrictions: No Other Position/Activity Restrictions: WBAT    Mobility  Bed Mobility Overal bed mobility: Needs Assistance Bed Mobility: Supine to Sit     Supine to sit: Min assist;HOB elevated     General bed mobility comments: patient already in long sitting upon arrival, min A to manage L LE toward edge of bed    Transfers Overall transfer level: Needs assistance Equipment used: Rolling walker (2 wheeled) Transfers: Sit to/from Stand Sit to Stand: Supervision;Min assist Stand pivot transfers: Min assist       General transfer comment: performed transfer x 2 wtih improved ability to transitions UE from chair to RW requiring only S  Ambulation/Gait Ambulation/Gait assistance: Min guard Gait Distance (Feet): 30 Feet Assistive device: Rolling walker (2 wheeled) Gait Pattern/deviations: Step-to pattern;Trunk flexed;Decreased stance time - left;Antalgic Gait velocity:  decr   General Gait Details: performed ambulation distance x 2   Stairs             Wheelchair Mobility    Modified Rankin (Stroke Patients Only)       Balance Overall balance assessment: Needs assistance Sitting-balance support: No upper extremity supported;Feet supported Sitting balance-Leahy Scale: Fair     Standing balance support: Bilateral upper extremity supported;During functional activity Standing balance-Leahy Scale: Poor Standing balance comment: reliant on UE support                            Cognition Arousal/Alertness: Awake/alert Behavior During Therapy: WFL for tasks assessed/performed;Impulsive Overall Cognitive Status: Within Functional Limits for tasks assessed                                 General Comments: sits quickly into chair      Exercises Total Joint Exercises Ankle Circles/Pumps: AROM;10 reps;Left;Supine Quad Sets: AROM;Left;10 reps;Supine Heel Slides: AAROM;Left;20 reps;Supine Hip ABduction/ADduction: AAROM;Left;10 reps;Supine Straight Leg Raises: AAROM;Left;10 reps;Supine Long Arc Quad: AROM;Left;10 reps;Seated (limited ROM) Marching in Standing: AROM;Both;20 reps;Standing (with RW)    General Comments General comments (skin integrity, edema, etc.): PA in room wtih pt complaining of pain in L calf, PA cleared pt to ambulation. Increased time spent discussing d/c plan wtih PA and pt, pt states husband is at home and can walk and pt has 4 stairs but can avoidthem according to interpreter, will need to clarify      Pertinent Vitals/Pain Pain Assessment: Faces Faces Pain Scale: Hurts a little  bit Pain Location: L knee Pain Descriptors / Indicators: Sore;Discomfort;Grimacing Pain Intervention(s): Monitored during session    Home Living Family/patient expects to be discharged to:: Private residence Living Arrangements: Spouse/significant other;Other relatives;Non-relatives/Friends Available Help at  Discharge: Friend(s);Family Type of Home: House Home Access: Level entry   Home Layout: Two level Home Equipment: Walker - 2 wheels;Cane - single point;Bedside commode      Prior Function Level of Independence: Independent with assistive device(s)      Comments: pt reports using cane vs RW, pt did not differentiate when asked clarification questions   PT Goals (current goals can now be found in the care plan section) Acute Rehab PT Goals Patient Stated Goal: go home PT Goal Formulation: With patient Time For Goal Achievement: 07/22/20 Potential to Achieve Goals: Good Progress towards PT goals: Progressing toward goals    Frequency    7X/week      PT Plan Current plan remains appropriate    Co-evaluation              AM-PAC PT "6 Clicks" Mobility   Outcome Measure  Help needed turning from your back to your side while in a flat bed without using bedrails?: A Little Help needed moving from lying on your back to sitting on the side of a flat bed without using bedrails?: A Little Help needed moving to and from a bed to a chair (including a wheelchair)?: A Little Help needed standing up from a chair using your arms (e.g., wheelchair or bedside chair)?: A Little Help needed to walk in hospital room?: A Little Help needed climbing 3-5 steps with a railing? : A Lot 6 Click Score: 17    End of Session Equipment Utilized During Treatment: Gait belt;Oxygen Activity Tolerance: Patient tolerated treatment well Patient left: with call bell/phone within reach;in bed;with bed alarm set;Other (comment) Nurse Communication: Mobility status PT Visit Diagnosis: Difficulty in walking, not elsewhere classified (R26.2);Other abnormalities of gait and mobility (R26.89)     Time: 2751-7001 PT Time Calculation (min) (ACUTE ONLY): 30 min  Charges:  $Gait Training: 8-22 mins $Therapeutic Exercise: 23-37 mins $Therapeutic Activity: 8-22 mins                     Ginette Otto,  DPT Acute Rehabilitation Services [phone]    [name] [name] 07/09/2020, 1:23 PM

## 2020-07-09 NOTE — Progress Notes (Signed)
Physical Therapy Treatment Patient Details Name: [name] [name] MRN: [id number] DOB: 1950-04-02 Today's Date: 07/09/2020    History of Present Illness 70 yo female s/p L TKR on 07/07/20. PMH includes R TKR 12/2019, DMII, afib, HTN.    PT Comments    Pt with improved tolerance for treatment. Pt states knee felt better after AAROm for heel slides. Improved weight bearing while in standing. Pt will benefit from skilled PT to address deficits in strength, ROM, coordination, gait, endurance and safety to maximize independence with functional mobility prior to discharge.     Follow Up Recommendations  Home health PT;Supervision for mobility/OOB     Equipment Recommendations  Rolling walker with 5" wheels    Recommendations for Other Services       Precautions / Restrictions Precautions Precautions: Fall Restrictions Weight Bearing Restrictions: No Other Position/Activity Restrictions: WBAT    Mobility  Bed Mobility Overal bed mobility: Needs Assistance Bed Mobility: Supine to Sit     Supine to sit: Min assist;HOB elevated     General bed mobility comments: patient already in long sitting upon arrival, min A to manage L LE toward edge of bed    Transfers Overall transfer level: Needs assistance Equipment used: Rolling walker (2 wheeled) Transfers: Sit to/from Stand Sit to Stand: Min assist Stand pivot transfers: Min assist       General transfer comment: cueing for hand placement, min A needed when moving hand from chair to RW  Ambulation/Gait                 Stairs             Wheelchair Mobility    Modified Rankin (Stroke Patients Only)       Balance Overall balance assessment: Needs assistance Sitting-balance support: No upper extremity supported;Feet supported Sitting balance-Leahy Scale: Fair     Standing balance support: Bilateral upper extremity supported;During functional activity Standing balance-Leahy Scale: Poor Standing  balance comment: reliant on UE support                            Cognition Arousal/Alertness: Awake/alert Behavior During Therapy: WFL for tasks assessed/performed;Impulsive Overall Cognitive Status: Within Functional Limits for tasks assessed                                 General Comments: sits quickly into chair      Exercises Total Joint Exercises Ankle Circles/Pumps: AROM;10 reps;Left;Supine Quad Sets: AROM;Left;10 reps;Supine Heel Slides: AAROM;Left;20 reps;Supine Hip ABduction/ADduction: AAROM;Left;10 reps;Supine Straight Leg Raises: AAROM;Left;10 reps;Supine Long Arc Quad: AROM;Left;10 reps;Seated (limited ROM) Marching in Standing: AROM;Both;20 reps;Standing (with RW)    General Comments General comments (skin integrity, edema, etc.): pt found to be at 78% SpO2 upon arrival O2 [name] placed at 1.5 L SpO2 improved to 100%, RN notified      Pertinent Vitals/Pain Pain Assessment: Faces Faces Pain Scale: Hurts little more Pain Location: L knee Pain Descriptors / Indicators: Sore;Discomfort;Grimacing Pain Intervention(s): Monitored during session    Home Living Family/patient expects to be discharged to:: Private residence Living Arrangements: Spouse/significant other;Other relatives;Non-relatives/Friends Available Help at Discharge: Friend(s);Family Type of Home: House Home Access: Level entry   Home Layout: Two level Home Equipment: Walker - 2 wheels;Cane - single point;Bedside commode      Prior Function Level of Independence: Independent with assistive device(s)      Comments: pt reports  using cane vs RW, pt did not differentiate when asked clarification questions   PT Goals (current goals can now be found in the care plan section) Acute Rehab PT Goals Patient Stated Goal: go home PT Goal Formulation: With patient Time For Goal Achievement: 07/22/20 Potential to Achieve Goals: Good Progress towards PT goals: Progressing toward  goals    Frequency    7X/week      PT Plan Current plan remains appropriate    Co-evaluation              AM-PAC PT "6 Clicks" Mobility   Outcome Measure  Help needed turning from your back to your side while in a flat bed without using bedrails?: A Little Help needed moving from lying on your back to sitting on the side of a flat bed without using bedrails?: A Little Help needed moving to and from a bed to a chair (including a wheelchair)?: A Little Help needed standing up from a chair using your arms (e.g., wheelchair or bedside chair)?: A Little Help needed to walk in hospital room?: A Little Help needed climbing 3-5 steps with a railing? : A Lot 6 Click Score: 17    End of Session Equipment Utilized During Treatment: Gait belt Activity Tolerance: Patient tolerated treatment well Patient left: with call bell/phone within reach;in bed;with bed alarm set;Other (comment) (ice on knee) Nurse Communication: Mobility status PT Visit Diagnosis: Difficulty in walking, not elsewhere classified (R26.2);Other abnormalities of gait and mobility (R26.89)     Time: 5462-7035 PT Time Calculation (min) (ACUTE ONLY): 31 min  Charges:  $Therapeutic Exercise: 23-37 mins                     Ginette Otto, DPT Acute Rehabilitation Services [phone]    Lucretia Field 07/09/2020, 1:19 PM

## 2020-07-09 NOTE — Progress Notes (Signed)
  Subjective: [name] [name] is a 70 y.o. female s/p left TKA.  They are POD 2.  Pt's pain is controlled.  Pt denies numbness/tingling/weakness.  Pt has ambulated with some difficulty.  Still reports dizziness when ambulatory.  Denies any chest pain, shortness of breath.  She does note some calf pain in the left calf.  [place] interpreter was utilized.  Objective: Vital signs in last 24 hours: Temp:  [98.2 F (36.8 C)-98.8 F (37.1 C)] 98.8 F (37.1 C) (06/09 1125) Pulse Rate:  [89-110] 91 (06/09 1125) Resp:  [18-20] 19 (06/09 1125) BP: (107-137)/(59-94) 122/59 (06/09 1125) SpO2:  [92 %-100 %] 96 % (06/09 1125)  Intake/Output from previous day: 06/08 0701 - 06/09 0700 In: 240 [P.O.:240] Out: 1700 [Urine:1700] Intake/Output this shift: No intake/output data recorded.  Exam:  No gross blood or drainage overlying the dressing 2+ DP pulse Sensation intact distally in the left foot Able to dorsiflex and plantarflex the left foot Moderate calf tenderness.  Positive Homans' sign.   Labs: Recent Labs    07/08/20 0408 07/09/20 0124  HGB 12.5 10.7*   Recent Labs    07/08/20 0408 07/09/20 0124  WBC 9.1 13.9*  RBC 5.39* 4.61  HCT 38.7 32.7*  PLT PLATELET CLUMPS NOTED ON SMEAR, UNABLE TO ESTIMATE 123*   No results for input(s): NA, K, CL, CO2, BUN, CREATININE, GLUCOSE, CALCIUM in the last 72 hours. No results for input(s): LABPT, INR in the last 72 hours.  Assessment/Plan: Pt is POD 2 s/p left TKA.    -Plan to discharge to home hopefully tomorrow pending patient's pain and PT eval  -WBAT with a walker  -Patient has increased calf tenderness compared with yesterday.  Plan to order duplex to rule out DVT.  No significant chest pain or shortness of breath.  Vital signs stable at this time.   Tavin Vernet L Regan Mcbryar 07/09/2020, 4:04 PM

## 2020-07-09 NOTE — Evaluation (Signed)
Occupational Therapy Evaluation Patient Details Name: [name] [name] MRN: [id number] DOB: 21-Mar-1950 Today's Date: 07/09/2020    History of Present Illness 70 yo female s/p L TKR on 07/07/20. PMH includes R TKR 12/2019, DMII, afib, HTN.   Clinical Impression   Patient lives with family in a two level house with level entry, typically independent at baseline with self care tasks and use of adaptive device. Patient needing mod to max assist for lower body self care, set up/supervision for lower body self care and up to mod A for stand pivot transfer to recliner chair. Patient min A to power up to standing however sits very quickly into chair needing mod assist for safety. Patient denied knee buckling just states via interpreter that this knee surgery has been harder to walk compared to previous one. Recommend continued acute OT services to maximize patient safety and independence with self care tasks, recommend 24/7 assistance at least initially at home as patient is currently fall risk.      Follow Up Recommendations  Home health OT;Supervision/Assistance - 24 hour    Equipment Recommendations  None recommended by OT       Precautions / Restrictions Precautions Precautions: Fall Restrictions Weight Bearing Restrictions: No      Mobility Bed Mobility Overal bed mobility: Needs Assistance Bed Mobility: Supine to Sit     Supine to sit: Min assist;HOB elevated     General bed mobility comments: patient already in long sitting upon arrival, min A to manage L LE toward edge of bed    Transfers Overall transfer level: Needs assistance Equipment used: Rolling walker (2 wheeled) Transfers: Sit to/from [employer] Sit to Stand: Min assist Stand pivot transfers: Mod assist       General transfer comment: min A for safety to power up to standing from edge of bed, mod A to pivot safely into chair appeared almost as L knee buckled however patient denied when  asked via interpreter.    Balance Overall balance assessment: Needs assistance Sitting-balance support: No upper extremity supported;Feet supported Sitting balance-Leahy Scale: Fair     Standing balance support: Bilateral upper extremity supported;During functional activity Standing balance-Leahy Scale: Poor Standing balance comment: reliant on UE support                           ADL either performed or assessed with clinical judgement   ADL Overall ADL's : Needs assistance/impaired Eating/Feeding: Independent;Sitting   Grooming: Set up;Sitting   Upper Body Bathing: Set up;Supervision/ safety;Sitting   Lower Body Bathing: Moderate assistance;Sitting/lateral leans;Sit to/from stand   Upper Body Dressing : Set up;Supervision/safety;Sitting   Lower Body Dressing: Maximal assistance;Sitting/lateral leans;Sit to/from stand Lower Body Dressing Details (indicate cue type and reason): limited standing tolerance Toilet Transfer: Minimal assistance;Moderate assistance;Stand-pivot;Cueing for safety;Cueing for sequencing;BSC;RW Toilet Transfer Details (indicate cue type and reason): to recliner, min A for safety to power up to standing, mod A to safely transfer into recliner as patient sits quickly with poor eccentric control. asked if felt like knee buckled pt denied Toileting- Architect and Hygiene: Moderate assistance;Sitting/lateral lean;Sit to/from stand       Functional mobility during ADLs: Minimal assistance;Moderate assistance;Cueing for safety;Cueing for sequencing;Rolling walker General ADL Comments: patient requiring increased assistance with self care tasks due to pain limiting activity tolerance, balance, safety      Pertinent Vitals/Pain Pain Assessment: Faces Faces Pain Scale: Hurts even more Pain Location: L knee Pain Descriptors /  Indicators: Sore;Discomfort;Grimacing Pain Intervention(s): Monitored during session     Hand Dominance  Right   Extremity/Trunk Assessment Upper Extremity Assessment Upper Extremity Assessment: Overall WFL for tasks assessed   Lower Extremity Assessment Lower Extremity Assessment: Defer to PT evaluation       Communication Communication Communication: Prefers language other than [place];Other (comment) (french interpreter, speaks lingala)   Cognition Arousal/Alertness: Awake/alert Behavior During Therapy: WFL for tasks assessed/performed;Impulsive Overall Cognitive Status: Within Functional Limits for tasks assessed                                 General Comments: sits quickly into chair              Home Living Family/patient expects to be discharged to:: Private residence Living Arrangements: Spouse/significant other;Other relatives;Non-relatives/Friends Available Help at Discharge: Friend(s);Family Type of Home: House Home Access: Level entry     Home Layout: Two level     Bathroom Shower/Tub: Walk-in shower;Tub/shower unit   Bathroom Toilet: Standard     Home Equipment: Environmental consultant - 2 wheels;Cane - single point;Bedside commode          Prior Functioning/Environment Level of Independence: Independent with assistive device(s)        Comments: pt reports using cane vs RW, pt did not differentiate when asked clarification questions        OT Problem List: Decreased activity tolerance;Impaired balance (sitting and/or standing);Decreased safety awareness;Pain;Obesity      OT Treatment/Interventions: Self-care/ADL training;DME and/or AE instruction;Therapeutic activities;Patient/family education;Balance training    OT Goals(Current goals can be found in the care plan section) Acute Rehab OT Goals Patient Stated Goal: go home OT Goal Formulation: With patient Time For Goal Achievement: 07/23/20 Potential to Achieve Goals: Good  OT Frequency: Min 2X/week    AM-PAC OT "6 Clicks" Daily Activity     Outcome Measure Help from another person eating  meals?: None Help from another person taking care of personal grooming?: A Little Help from another person toileting, which includes using toliet, bedpan, or urinal?: A Lot Help from another person bathing (including washing, rinsing, drying)?: A Lot Help from another person to put on and taking off regular upper body clothing?: A Little Help from another person to put on and taking off regular lower body clothing?: A Lot 6 Click Score: 16   End of Session Equipment Utilized During Treatment: Rolling walker;Gait belt Nurse Communication: Mobility status  Activity Tolerance: Patient tolerated treatment well Patient left: in chair;with call bell/phone within reach;with chair alarm set  OT Visit Diagnosis: Unsteadiness on feet (R26.81);Other abnormalities of gait and mobility (R26.89);Pain Pain - Right/Left: Left Pain - part of body: Knee                Time: 9379-0240 OT Time Calculation (min): 17 min Charges:  OT General Charges $OT Visit: 1 Visit OT Evaluation $OT Eval Low Complexity: 1 Low  Marlyce Huge OT OT pager: [phone]  Carmelia Roller 07/09/2020, 10:51 AM

## 2020-07-09 NOTE — Plan of Care (Signed)

## 2020-07-10 ENCOUNTER — Other Ambulatory Visit: Payer: Self-pay

## 2020-07-10 ENCOUNTER — Inpatient Hospital Stay (HOSPITAL_COMMUNITY): Payer: Self-pay

## 2020-07-10 ENCOUNTER — Telehealth: Payer: Self-pay

## 2020-07-10 NOTE — Telephone Encounter (Signed)
Okay thank you for the headsup, we can discuss setting this up at her first appointment I think

## 2020-07-10 NOTE — TOC Initial Note (Addendum)
Transition of Care [facility]) - Initial/Assessment Note    Patient Details  Name: [name] [name] MRN: [id number] Date of Birth: 1950/10/05  [facility] CM/SW Contact:    Epifanio Lesches, RN Phone Number: 07/10/2020, 3:10 PM  Clinical Narrative:                     - s/p L TKR on 07/07/20 PMH includes R TKR 12/2019, DM, afib, HTN Spoke with pt @ bedside regarding d/c planning. Used [employer] interpreter, pt speaks [name]. Pt resides with husband. States PTA independent  with ADL's. Already with rolling walker and 3 in 1/BSC @ home. NCM shared PT's recommendations  for Home health PT. Pt agreeable to home health services. Pt without insurance. Referral made with [facility] (charity care )....acceptance pending. Active with [facility], PCP: NEWLIN, ENOBONG  Pt without Rx med concerns.  Early (caregiver) [phone] to provide transportation to home.  TOC team will continue to monitor and follow for needs.   Expected Discharge Plan: Home w Home Health Services Barriers to Discharge: Continued Medical Work up   Patient Goals and CMS Choice     Choice offered to / list presented to : Patient, Spouse  Expected Discharge Plan and Services Expected Discharge Plan: Home w Home Health Services       Living arrangements for the past 2 months: Single Family Home                                      Prior Living Arrangements/Services Living arrangements for the past 2 months: Single Family Home Lives with:: Spouse                   Activities of Daily Living      Permission Sought/Granted                  Emotional Assessment       Orientation: : Oriented to Self, Oriented to Place, Oriented to  Time, Oriented to Situation Alcohol / Substance Use: Not Applicable Psych Involvement: No (comment)  Admission diagnosis:  S/P TKR (total knee replacement), left [Z96.652] Patient Active Problem List   Diagnosis Date Noted   S/P TKR  (total knee replacement), 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   Conjunctival hemorrhage of right eye 03/25/2020   Chronic anticoagulation 03/25/2020   S/P total knee arthroplasty 12/04/2019   S/P knee surgery 12/03/2019   Type 2 diabetes mellitus with obesity (HCC) 07/22/2019   Chronic atrial fibrillation (HCC) 02/26/2019   Essential hypertension, benign 05/03/2018   Knee osteoarthritis 10/24/2017   PCP:  Hoy Register, MD Pharmacy:   [facility] [address] [place] [address] Phone: [phone] Fax: [phone]  [facility] [address] [place] [address] Phone: [phone] Fax: [phone]     Social Determinants of Health (SDOH) Interventions    Readmission Risk Interventions Readmission Risk Prevention Plan 12/04/2019  Post Dischage Appt Complete  Medication Screening Complete  Transportation Screening Complete  Some recent data might be hidden

## 2020-07-10 NOTE — Progress Notes (Signed)
Physical Therapy Treatment Patient Details Name: [name] [name] MRN: [id number] DOB: [birth year] Today's Date: 07/10/2020    History of Present Illness 70 yo female s/p L TKR on 07/07/20. PMH includes R TKR 12/2019, DMII, afib, HTN.    PT Comments    Pt continues to make progress towards goals. This skilled session focused on stair training. Pt required mod A to negotiate steps. She states her husband will be present to help, but she would like it if we could send some one to help. Plan to continue stair and gait training next session to preporation for d/c home.     Follow Up Recommendations  Home health PT;Supervision for mobility/OOB     Equipment Recommendations  Rolling walker with 5" wheels    Recommendations for Other Services       Precautions / Restrictions Precautions Precautions: Fall Precaution Comments: monitor O2 Restrictions Weight Bearing Restrictions: No Other Position/Activity Restrictions: WBAT    Mobility  Bed Mobility               General bed mobility comments: up in chair on arrival    Transfers Overall transfer level: Needs assistance Equipment used: Rolling walker (2 wheeled) Transfers: Sit to/from Stand Sit to Stand: Min assist         General transfer comment: min A to rise from recliner chair with use of arm rests.  Ambulation/Gait Ambulation/Gait assistance: Min guard Gait Distance (Feet): 16 Feet Assistive device: Rolling walker (2 wheeled) Gait Pattern/deviations: Step-to pattern;Trunk flexed;Decreased stance time - left;Antalgic Gait velocity: decr Gait velocity interpretation: <1.31 ft/sec, indicative of household ambulator General Gait Details: very slow guarded gait. Mildly antalgic.   Stairs Stairs: Yes Stairs assistance: Mod assist Stair Management: One rail Left;Step to pattern;Sideways Number of Stairs: 2 General stair comments: Pt negotiated steps sideways, visual and verbal cues given for technique.  Pt strugging to keep both hands on hand rail and pushing on therapist instead for support. Mod A for assist and stability.   Wheelchair Mobility    Modified Rankin (Stroke Patients Only)       Balance Overall balance assessment: Needs assistance Sitting-balance support: No upper extremity supported;Feet supported Sitting balance-Leahy Scale: Fair     Standing balance support: Bilateral upper extremity supported;During functional activity Standing balance-Leahy Scale: Poor Standing balance comment: reliant on UE support                            Cognition Arousal/Alertness: Awake/alert Behavior During Therapy: WFL for tasks assessed/performed;Impulsive Overall Cognitive Status: Within Functional Limits for tasks assessed                                        Exercises      General Comments General comments (skin integrity, edema, etc.): Interperter used for session #240005      Pertinent Vitals/Pain Pain Assessment: Faces Faces Pain Scale: Hurts even more Pain Location: L knee Pain Descriptors / Indicators: Sore;Discomfort;Grimacing Pain Intervention(s): Monitored during session;Limited activity within patient's tolerance    Home Living                      Prior Function            PT Goals (current goals can now be found in the care plan section) Acute Rehab PT Goals Patient Stated Goal: go home  PT Goal Formulation: With patient Time For Goal Achievement: 07/22/20 Potential to Achieve Goals: Good Progress towards PT goals: Progressing toward goals    Frequency    7X/week      PT Plan Current plan remains appropriate    Co-evaluation              AM-PAC PT "6 Clicks" Mobility   Outcome Measure  Help needed turning from your back to your side while in a flat bed without using bedrails?: A Little Help needed moving from lying on your back to sitting on the side of a flat bed without using bedrails?: A  Little Help needed moving to and from a bed to a chair (including a wheelchair)?: A Little Help needed standing up from a chair using your arms (e.g., wheelchair or bedside chair)?: A Little Help needed to walk in hospital room?: A Little Help needed climbing 3-5 steps with a railing? : A Lot 6 Click Score: 17    End of Session Equipment Utilized During Treatment: Gait belt;Oxygen Activity Tolerance: Patient tolerated treatment well Patient left: with call bell/phone within reach;in chair Nurse Communication: Mobility status PT Visit Diagnosis: Difficulty in walking, not elsewhere classified (R26.2);Other abnormalities of gait and mobility (R26.89)     Time: 1443-1530 PT Time Calculation (min) (ACUTE ONLY): 47 min  Charges:  $Gait Training: 38-52 mins                    Kallie Locks, [place] Pager [phone] Acute Rehab  Sheral Apley 07/10/2020, 3:43 PM

## 2020-07-10 NOTE — Progress Notes (Signed)
Lower extremity venous study attempted. Patient with physical therapy. Will attempt again as schedule and patient availability permits.

## 2020-07-10 NOTE — Progress Notes (Signed)
Patient stable Pain controlled Has been walking in the room Tolerating CPM Dressing dry No calf tenderness today.  Ultrasound pending from yesterday Anticipate discharge tomorrow

## 2020-07-10 NOTE — Telephone Encounter (Signed)
Patients caregiver Early called regarding transportation ( she doesn't know who set the transportation up for the patient) she stated the patient does not need transportation when she will be discharged from the hospital [place] Burton stated the patient will need transportation to complete outpatient therapy call back:[phone]

## 2020-07-10 NOTE — Progress Notes (Signed)
Physical Therapy Treatment Patient Details Name: [name] [name] MRN: [id number] DOB: December 12, 1950 Today's Date: 07/10/2020    History of Present Illness 70 yo female s/p L TKR on 07/07/20. PMH includes R TKR 12/2019, DMII, afib, HTN.    PT Comments    Pt is making slow but steady progress towards goals. She progressed OOB to ambulate laps in room. Pt on 2L O2 on arrival to room with SpO2 between 92-88% at rest. Remained on 2L throughout session. Pt with report of dizziness throughout session, though vitals remained stable. Will plan for stair training in PM.     Follow Up Recommendations  Home health PT;Supervision for mobility/OOB     Equipment Recommendations  Rolling walker with 5" wheels    Recommendations for Other Services       Precautions / Restrictions Precautions Precautions: Fall Precaution Comments: monitor O2 Restrictions Weight Bearing Restrictions: No Other Position/Activity Restrictions: WBAT    Mobility  Bed Mobility Overal bed mobility: Needs Assistance Bed Mobility: Supine to Sit     Supine to sit: HOB elevated;Min guard     General bed mobility comments: Pt able to slowly progress to sitting EOB w/o assist. HOB elevated and heavy use of bed rails.    Transfers Overall transfer level: Needs assistance Equipment used: Rolling walker (2 wheeled) Transfers: Sit to/from Stand Sit to Stand: Min assist;Mod assist         General transfer comment: mod A to rise from lower bed. Significant trunk flexion on rising and increased time required to achieve fully upright position. Min A to rise from [facility] with use of arm rests.  Ambulation/Gait Ambulation/Gait assistance: Min guard Gait Distance (Feet): 45 Feet (45x1 15x1) Assistive device: Rolling walker (2 wheeled) Gait Pattern/deviations: Step-to pattern;Trunk flexed;Decreased stance time - left;Antalgic Gait velocity: decr Gait velocity interpretation: <1.31 ft/sec, indicative of household  ambulator General Gait Details: very slow guarded gait. Mildly antalgic. Assist for line management only.   Stairs             Wheelchair Mobility    Modified Rankin (Stroke Patients Only)       Balance Overall balance assessment: Needs assistance Sitting-balance support: No upper extremity supported;Feet supported Sitting balance-Leahy Scale: Fair     Standing balance support: Bilateral upper extremity supported;During functional activity Standing balance-Leahy Scale: Poor Standing balance comment: reliant on UE support                            Cognition Arousal/Alertness: Awake/alert Behavior During Therapy: WFL for tasks assessed/performed;Impulsive Overall Cognitive Status: Within Functional Limits for tasks assessed                                        Exercises      General Comments General comments (skin integrity, edema, etc.): Pt able to perform peri care with min guard. Interperter used for session.      Pertinent Vitals/Pain Pain Assessment: Faces Faces Pain Scale: Hurts even more Pain Location: L knee Pain Descriptors / Indicators: Sore;Discomfort;Grimacing Pain Intervention(s): Monitored during session;Limited activity within patient's tolerance;Repositioned;Patient requesting pain meds-RN notified    Home Living                      Prior Function            PT Goals (current goals can  now be found in the care plan section) Acute Rehab PT Goals Patient Stated Goal: go home PT Goal Formulation: With patient Time For Goal Achievement: 07/22/20 Potential to Achieve Goals: Good Progress towards PT goals: Progressing toward goals    Frequency    7X/week      PT Plan Current plan remains appropriate    Co-evaluation              AM-PAC PT "6 Clicks" Mobility   Outcome Measure  Help needed turning from your back to your side while in a flat bed without using bedrails?: A Little Help  needed moving from lying on your back to sitting on the side of a flat bed without using bedrails?: A Little Help needed moving to and from a bed to a chair (including a wheelchair)?: A Little Help needed standing up from a chair using your arms (e.g., wheelchair or bedside chair)?: A Little Help needed to walk in hospital room?: A Little Help needed climbing 3-5 steps with a railing? : A Lot 6 Click Score: 17    End of Session Equipment Utilized During Treatment: Gait belt;Oxygen Activity Tolerance: Patient tolerated treatment well Patient left: with call bell/phone within reach;in chair Nurse Communication: Mobility status;Patient requests pain meds (d/c purwik and begin transferring to [facility]) PT Visit Diagnosis: Difficulty in walking, not elsewhere classified (R26.2);Other abnormalities of gait and mobility (R26.89)     Time: 9381-8299 PT Time Calculation (min) (ACUTE ONLY): 61 min  Charges:  $Gait Training: 23-37 mins $Therapeutic Activity: 23-37 mins                     Kallie Locks, [place] Pager [phone] Acute Rehab  Sheral Apley 07/10/2020, 11:20 AM

## 2020-07-11 ENCOUNTER — Inpatient Hospital Stay (HOSPITAL_COMMUNITY): Payer: Self-pay

## 2020-07-11 DIAGNOSIS — Z9889 Other specified postprocedural states: Secondary | ICD-10-CM

## 2020-07-11 DIAGNOSIS — M7989 Other specified soft tissue disorders: Secondary | ICD-10-CM

## 2020-07-11 DIAGNOSIS — M79662 Pain in left lower leg: Secondary | ICD-10-CM

## 2020-07-11 DIAGNOSIS — M1712 Unilateral primary osteoarthritis, left knee: Secondary | ICD-10-CM

## 2020-07-11 MED ORDER — METHOCARBAMOL 500 MG PO TABS: 500.0000 mg | ORAL_TABLET | Freq: Three times a day (TID) | ORAL | 0 refills | Status: DC | PRN

## 2020-07-11 MED ORDER — DOCUSATE SODIUM 100 MG PO CAPS: 100.0000 mg | ORAL_CAPSULE | Freq: Two times a day (BID) | ORAL | 0 refills | Status: DC

## 2020-07-11 MED ORDER — OXYCODONE HCL 5 MG PO TABS: 5.0000 mg | ORAL_TABLET | ORAL | 0 refills | Status: DC | PRN

## 2020-07-11 NOTE — Progress Notes (Signed)
Physical Therapy Treatment Patient Details Name: [name] [name] MRN: [id number] DOB: [birth year] Today's Date: 07/11/2020    History of Present Illness 70 yo female s/p L TKR on 07/07/20. PMH includes R TKR 12/2019, DMII, afib, HTN.    PT Comments    Pt seated in recliner.  Utilized status audio interpreter: ID #:  [id number] Name: [name] [name].  Pt tolerated session well as evident by increased gt distance and requiring decreased assistance.  Continue to recommend HHPT.     Follow Up Recommendations  Home health PT;Supervision for mobility/OOB     Equipment Recommendations  Rolling walker with 5" wheels    Recommendations for Other Services       Precautions / Restrictions Precautions Precautions: Fall Precaution Comments: monitor O2 Restrictions Weight Bearing Restrictions: No Other Position/Activity Restrictions: WBAT    Mobility  Bed Mobility               General bed mobility comments: Pt seated in recliner.    Transfers Overall transfer level: Needs assistance Equipment used: Rolling walker (2 wheeled) Transfers: Sit to/from Stand Sit to Stand: Supervision         General transfer comment: Cues for hand placement. Increased time and effort to rise into standing but no physical assistance.  Ambulation/Gait Ambulation/Gait assistance: Min guard Gait Distance (Feet): 80 Feet Assistive device: Rolling walker (2 wheeled) Gait Pattern/deviations: Step-to pattern;Trunk flexed;Decreased stance time - left;Antalgic Gait velocity: decr   General Gait Details: Noticeable fatigue but able to increase distance.  Pt required cues for upper trunk control.   Stairs Stairs: Yes Stairs assistance: Supervision Stair Management: Step to pattern;Two rails;Forwards Number of Stairs: 4 General stair comments: Cues for sequencing and hand placement.   Wheelchair Mobility    Modified Rankin (Stroke Patients Only)       Balance Overall balance assessment: Needs  assistance Sitting-balance support: No upper extremity supported;Feet supported Sitting balance-Leahy Scale: Fair       Standing balance-Leahy Scale: Poor                              Cognition Arousal/Alertness: Awake/alert Behavior During Therapy: WFL for tasks assessed/performed;Impulsive Overall Cognitive Status: Within Functional Limits for tasks assessed                                 General Comments: sits quickly into chair      Exercises Total Joint Exercises Goniometric ROM: 5-55 L knee.    General Comments        Pertinent Vitals/Pain Pain Assessment: 0-10 Pain Score: 8  Pain Location: L knee Pain Descriptors / Indicators: Sore;Discomfort;Grimacing Pain Intervention(s): Monitored during session;Repositioned    Home Living                      Prior Function            PT Goals (current goals can now be found in the care plan section) Acute Rehab PT Goals Patient Stated Goal: go home Potential to Achieve Goals: Good Progress towards PT goals: Progressing toward goals    Frequency    7X/week      PT Plan Current plan remains appropriate    Co-evaluation              AM-PAC PT "6 Clicks" Mobility   Outcome Measure  Help needed turning from your back  to your side while in a flat bed without using bedrails?: A Little Help needed moving from lying on your back to sitting on the side of a flat bed without using bedrails?: A Little Help needed moving to and from a bed to a chair (including a wheelchair)?: A Little Help needed standing up from a chair using your arms (e.g., wheelchair or bedside chair)?: A Little Help needed to walk in hospital room?: A Little Help needed climbing 3-5 steps with a railing? : A Lot 6 Click Score: 17    End of Session Equipment Utilized During Treatment: Gait belt;Oxygen Activity Tolerance: Patient tolerated treatment well Patient left: with call bell/phone within  reach;in chair Nurse Communication: Mobility status PT Visit Diagnosis: Difficulty in walking, not elsewhere classified (R26.2);Other abnormalities of gait and mobility (R26.89)     Time: 4970-2637 PT Time Calculation (min) (ACUTE ONLY): 47 min  Charges:  $Gait Training: 23-37 mins $Therapeutic Activity: 8-22 mins                     [name] [name] R. , PTA Acute Rehabilitation Services Pager [phone] Office [phone]    [name] [name] Artis Delay 07/11/2020, 2:30 PM

## 2020-07-11 NOTE — Progress Notes (Signed)
VASCULAR LAB    Left lower extremity venous duplex has been performed.  See CV proc for preliminary results.   Kyelle Urbas, RVT 07/11/2020, 12:22 PM

## 2020-07-11 NOTE — Plan of Care (Signed)
  Problem: Education: Goal: Knowledge of General Education information will improve Description: Including pain rating scale, medication(s)/side effects and non-pharmacologic comfort measures Outcome: Completed/Met   Problem: Health Behavior/Discharge Planning: Goal: Ability to manage health-related needs will improve Outcome: Completed/Met   Problem: Clinical Measurements: Goal: Ability to maintain clinical measurements within normal limits will improve Outcome: Completed/Met Goal: Will remain free from infection Outcome: Completed/Met Goal: Diagnostic test results will improve Outcome: Completed/Met Goal: Respiratory complications will improve Outcome: Completed/Met Goal: Cardiovascular complication will be avoided Outcome: Completed/Met   Problem: Activity: Goal: Risk for activity intolerance will decrease Outcome: Completed/Met   Problem: Nutrition: Goal: Adequate nutrition will be maintained Outcome: Completed/Met   Problem: Coping: Goal: Level of anxiety will decrease Outcome: Completed/Met   Problem: Elimination: Goal: Will not experience complications related to bowel motility Outcome: Completed/Met Goal: Will not experience complications related to urinary retention Outcome: Completed/Met   Problem: Pain Managment: Goal: General experience of comfort will improve Outcome: Completed/Met   Problem: Safety: Goal: Ability to remain free from injury will improve Outcome: Completed/Met   Problem: Skin Integrity: Goal: Risk for impaired skin integrity will decrease Outcome: Completed/Met  Discharge instructions reviewed with patient and provided in both [place] and [place] versions.  This nurse speaks [place] and was able to ascertain patient's comprehension of information discussed via use of "teach-back" technique.  Also had use of mobile translator for teaching purposes.  Patient discharged to home via private vehicle accompanied by good friend.  Addendum:  Pt  was concerned re:  need for oxygen at home.  Explained that she did not meet guidelines for oxygen use.  Oxygen sat on R/A = 92% with ambulation and 94% on 2 lpm.  At rest, oxygen sat = 94% on R/A and 95% on oxygen at 2 lpm.

## 2020-07-12 NOTE — Progress Notes (Signed)
Patient stable this morning.  Pain well controlled.  Mobilizing with PT well.  Surgical dressing is intact.  Plan for DC home after PT this morning.

## 2020-07-12 NOTE — Discharge Summary (Addendum)
Patient ID: [name] [name] MRN: [id number] DOB/AGE: Jul 04, 1950 70 y.o.  Admit date: 07/07/2020 Discharge date: 07/11/2020  Admission Diagnoses:  <principal problem not specified>  Discharge Diagnoses:  Active Problems:   S/P TKR (total knee replacement), left   Arthritis of left knee   Past Medical History:  Diagnosis Date   Acute kidney injury (HCC) 03/02/2019   Acute respiratory failure with hypoxia (HCC) 02/22/2019   Atrial fibrillation with RVR (HCC) 02/26/2019   COVID-19 02/22/2019   Dysrhythmia    para. atrial fibrillation   Flu-like symptoms 02/22/2019   Hypertension    Knee osteoarthritis 10/24/2017   Pneumonia     Surgeries: Procedure(s): LEFT TOTAL KNEE ARTHROPLASTY-CEMENTED on 07/07/2020   Consultants (if any):   Discharged Condition: Improved  Hospital Course: Eduardo Wurth is an 70 y.o. female who was admitted 07/07/2020 with a diagnosis of <principal problem not specified> and went to the operating room on 07/07/2020 and underwent the above named procedures.    She was given perioperative antibiotics:  Anti-infectives (From admission, onward)    Start     Dose/Rate Route Frequency Ordered Stop   07/07/20 2200  ceFAZolin (ANCEF) IVPB 2g/100 mL premix        2 g 200 mL/hr over 30 Minutes Intravenous Every 8 hours 07/07/20 1725 07/08/20 0552   07/07/20 1447  vancomycin (VANCOCIN) powder  Status:  Discontinued          As needed 07/07/20 1447 07/07/20 1600   07/07/20 0920  ceFAZolin (ANCEF) 2-4 GM/100ML-% IVPB       Note to Pharmacy: Samuella Cota   : cabinet override      07/07/20 0920 07/07/20 1241   07/07/20 0915  ceFAZolin (ANCEF) IVPB 2g/100 mL premix        2 g 200 mL/hr over 30 Minutes Intravenous On call to O.R. 07/07/20 0913 07/07/20 1240     .  She was given sequential compression devices, early ambulation, and appropriate chemoprophylaxis for DVT prophylaxis.  She benefited maximally from the hospital stay and there were no  complications.    Recent vital signs:  Vitals:   07/11/20 0345 07/11/20 0751  BP: 137/64 120/72  Pulse: 89 91  Resp: 16 17  Temp: 98 F (36.7 C) 98.2 F (36.8 C)  SpO2: 96% 97%    Recent laboratory studies:  Lab Results  Component Value Date   HGB 10.7 (L) 07/09/2020   HGB 12.5 07/08/2020   HGB 12.2 07/06/2020   Lab Results  Component Value Date   WBC 13.9 (H) 07/09/2020   PLT 123 (L) 07/09/2020   No results found for: INR Lab Results  Component Value Date   NA 138 07/01/2020   K 4.6 07/01/2020   CL 106 07/01/2020   CO2 23 07/01/2020   BUN 22 07/01/2020   CREATININE 1.19 (H) 07/01/2020   GLUCOSE 90 07/01/2020    Discharge Medications:   Allergies as of 07/11/2020   No Known Allergies      Medication List     STOP taking these medications    furosemide 20 MG tablet Commonly known as: LASIX   traMADol 50 MG tablet Commonly known as: ULTRAM       TAKE these medications    amLODipine 5 MG tablet Commonly known as: NORVASC Take 1 tablet (5 mg total) by mouth daily. To lower blood pressure   Cartia XT 120 MG 24 hr capsule Generic drug: diltiazem TAKE 1 CAPSULE (120 MG TOTAL) BY  MOUTH DAILY. What changed: how much to take   docusate sodium 100 MG capsule Commonly known as: COLACE Take 1 capsule (100 mg total) by mouth 2 (two) times daily.   Eliquis 5 MG Tabs tablet Generic drug: apixaban Take 1 tablet (5 mg total) by mouth 2 (two) times daily.   gabapentin 100 MG capsule Commonly known as: NEURONTIN TAKE 1 CAPSULE (100 MG TOTAL) BY MOUTH 3 (THREE) TIMES DAILY. What changed: how much to take   methocarbamol 500 MG tablet Commonly known as: ROBAXIN Take 1 tablet (500 mg total) by mouth every 8 (eight) hours as needed for muscle spasms.   oxyCODONE 5 MG immediate release tablet Commonly known as: Oxy IR/ROXICODONE Take 1-2 tablets (5-10 mg total) by mouth every 4 (four) hours as needed for moderate pain (pain score 4-6).   pantoprazole 40  MG tablet Commonly known as: PROTONIX TAKE 1 TABLET (40 MG TOTAL) BY MOUTH DAILY. TO REDUCE STOMACH ACID What changed:  how much to take how to take this when to take this   rosuvastatin 10 MG tablet Commonly known as: CRESTOR Take 1 tablet (10 mg total) by mouth daily. To lower cholesterol        Diagnostic Studies: VAS [place] LOWER EXTREMITY VENOUS (DVT)  Result Date: 07/11/2020  Lower Venous DVT Study Patient Name:  AOKI WEDEMEYER [facility]  Date of Exam:   07/11/2020 Medical Rec #: [id number]                Accession #:    [id number] Date of Birth: 04/13/1950               Patient Gender: F Patient Age:   66Y Exam Location:  [facility] Procedure:      VAS [place] LOWER EXTREMITY VENOUS (DVT) Referring Phys: [id number] CHARLES L MAGNANT --------------------------------------------------------------------------------  Indications: Pain, Swelling, and Status post left total knee replacement this hospitalization.  Limitations: Body habitus. Comparison Study: No prior Left LEV duplex on file Performing Technologist: Sherren Kerns RVS  Examination Guidelines: A complete evaluation includes B-mode imaging, spectral Doppler, color Doppler, and power Doppler as needed of all accessible portions of each vessel. Bilateral testing is considered an integral part of a complete examination. Limited examinations for reoccurring indications may be performed as noted. The reflux portion of the exam is performed with the patient in reverse Trendelenburg.  +-----+---------------+---------+-----------+----------+--------------+ RIGHTCompressibilityPhasicitySpontaneityPropertiesThrombus Aging +-----+---------------+---------+-----------+----------+--------------+ CFV  Full           Yes      Yes                                 +-----+---------------+---------+-----------+----------+--------------+   +---------+---------------+---------+-----------+----------+-------------------+ LEFT      CompressibilityPhasicitySpontaneityPropertiesThrombus Aging      +---------+---------------+---------+-----------+----------+-------------------+ CFV      Full           Yes      Yes                                      +---------+---------------+---------+-----------+----------+-------------------+ SFJ      Full                                                             +---------+---------------+---------+-----------+----------+-------------------+  FV Prox  Full                                                             +---------+---------------+---------+-----------+----------+-------------------+ FV Mid   Full                                                             +---------+---------------+---------+-----------+----------+-------------------+ FV DistalFull                                                             +---------+---------------+---------+-----------+----------+-------------------+ PFV      Full                                                             +---------+---------------+---------+-----------+----------+-------------------+ POP                     Yes      Yes                  patent by color and                                                       Doppler             +---------+---------------+---------+-----------+----------+-------------------+ PTV      Full                                                             +---------+---------------+---------+-----------+----------+-------------------+ PERO     Full                                                             +---------+---------------+---------+-----------+----------+-------------------+   right common femoral vein patent. no evidence of deep venous thrombosis of left lower extremity.  *See table(s) above for measurements and observations. Electronically signed by Heath Lark on 07/11/2020 at 7:20:29 PM.    Final     Disposition:  Discharge disposition: 01-Home or Self Care       Discharge Instructions     Call MD / Call 911   Complete by: As directed    If you experience chest pain or shortness of breath,  CALL 911 and be transported to the hospital emergency room.  If you develope a fever above 101 F, pus (white drainage) or increased drainage or redness at the wound, or calf pain, call your surgeon's office.   Constipation Prevention   Complete by: As directed    Drink plenty of fluids.  Prune juice may be helpful.  You may use a stool softener, such as Colace (over the counter) 100 mg twice a day.  Use MiraLax (over the counter) for constipation as needed.   Diet - low sodium heart healthy   Complete by: As directed    Discharge instructions   Complete by: As directed    You may shower, dressing is waterproof.  Do not remove the dressing, we will remove it at your first post-op appointment.  Do not take a bath or soak the knee in a tub or pool.  You may weightbear as you can tolerate on the operative leg with a walker.  Continue using the CPM machine 3 times per day for one hour each time, increasing the degrees of range of motion daily.  Use the blue cradle boot under your heel to work on getting your leg straight.  Do NOT put a pillow under your knee.  You will follow-up with Dr. August Saucer in the clinic in 2 weeks at your given appointment date.    INSTRUCTIONS AFTER JOINT REPLACEMENT   Remove items at home which could result in a fall. This includes throw rugs or furniture in walking pathways ICE to the affected joint every three hours while awake for 30 minutes at a time, for at least the first 3-5 days, and then as needed for pain and swelling.  Continue to use ice for pain and swelling. You may notice swelling that will progress down to the foot and ankle.  This is normal after surgery.  Elevate your leg when you are not up walking on it.   Continue to use the breathing machine you got in the hospital (incentive  spirometer) which will help keep your temperature down.  It is common for your temperature to cycle up and down following surgery, especially at night when you are not up moving around and exerting yourself.  The breathing machine keeps your lungs expanded and your temperature down.   DIET:  As you were doing prior to hospitalization, we recommend a well-balanced diet.  DRESSING / WOUND CARE / SHOWERING  Keep the surgical dressing until follow up.  The dressing is water proof, so you can shower without any extra covering.  IF THE DRESSING FALLS OFF or the wound gets wet inside, change the dressing with sterile gauze.  Please use good hand washing techniques before changing the dressing.  Do not use any lotions or creams on the incision until instructed by your surgeon.    ACTIVITY  Increase activity slowly as tolerated, but follow the weight bearing instructions below.   No driving for 6 weeks or until further direction given by your physician.  You cannot drive while taking narcotics.  No lifting or carrying greater than 10 lbs. until further directed by your surgeon. Avoid periods of inactivity such as sitting longer than an hour when not asleep. This helps prevent blood clots.  You may return to work once you are authorized by your doctor.     WEIGHT BEARING   Weight bearing as tolerated with assist device (walker, cane, etc) as directed, use it as long as suggested by your surgeon or  therapist, typically at least 4-6 weeks.   EXERCISES  Results after joint replacement surgery are often greatly improved when you follow the exercise, range of motion and muscle strengthening exercises prescribed by your doctor. Safety measures are also important to protect the joint from further injury. Any time any of these exercises cause you to have increased pain or swelling, decrease what you are doing until you are comfortable again and then slowly increase them. If you have problems or questions,  call your caregiver or physical therapist for advice.   Rehabilitation is important following a joint replacement. After just a few days of immobilization, the muscles of the leg can become weakened and shrink (atrophy).  These exercises are designed to build up the tone and strength of the thigh and leg muscles and to improve motion. Often times heat used for twenty to thirty minutes before working out will loosen up your tissues and help with improving the range of motion but do not use heat for the first two weeks following surgery (sometimes heat can increase post-operative swelling).   These exercises can be done on a training (exercise) mat, on the floor, on a table or on a bed. Use whatever works the best and is most comfortable for you.    Use music or television while you are exercising so that the exercises are a pleasant break in your day. This will make your life better with the exercises acting as a break in your routine that you can look forward to.   Perform all exercises about fifteen times, three times per day or as directed.  You should exercise both the operative leg and the other leg as well.  Exercises include:   Quad Sets - Tighten up the muscle on the front of the thigh (Quad) and hold for 5-10 seconds.   Straight Leg Raises - With your knee straight (if you were given a brace, keep it on), lift the leg to 60 degrees, hold for 3 seconds, and slowly lower the leg.  Perform this exercise against resistance later as your leg gets stronger.  Leg Slides: Lying on your back, slowly slide your foot toward your buttocks, bending your knee up off the floor (only go as far as is comfortable). Then slowly slide your foot back down until your leg is flat on the floor again.  Angel Wings: Lying on your back spread your legs to the side as far apart as you can without causing discomfort.  Hamstring Strength:  Lying on your back, push your heel against the floor with your leg straight by  tightening up the muscles of your buttocks.  Repeat, but this time bend your knee to a comfortable angle, and push your heel against the floor.  You may put a pillow under the heel to make it more comfortable if necessary.   A rehabilitation program following joint replacement surgery can speed recovery and prevent re-injury in the future due to weakened muscles. Contact your doctor or a physical therapist for more information on knee rehabilitation.    CONSTIPATION  Constipation is defined medically as fewer than three stools per week and severe constipation as less than one stool per week.  Even if you have a regular bowel pattern at home, your normal regimen is likely to be disrupted due to multiple reasons following surgery.  Combination of anesthesia, postoperative narcotics, change in appetite and fluid intake all can affect your bowels.   YOU MUST use at least one of the  following options; they are listed in order of increasing strength to get the job done.  They are all available over the counter, and you may need to use some, POSSIBLY even all of these options:    Drink plenty of fluids (prune juice may be helpful) and high fiber foods Colace 100 mg by mouth twice a day  Senokot for constipation as directed and as needed Dulcolax (bisacodyl), take with full glass of water  Miralax (polyethylene glycol) once or twice a day as needed.  If you have tried all these things and are unable to have a bowel movement in the first 3-4 days after surgery call either your surgeon or your primary doctor.    If you experience loose stools or diarrhea, hold the medications until you stool forms back up.  If your symptoms do not get better within 1 week or if they get worse, check with your doctor.  If you experience "the worst abdominal pain ever" or develop nausea or vomiting, please contact the office immediately for further recommendations for treatment.   ITCHING:  If you experience itching with  your medications, try taking only a single pain pill, or even half a pain pill at a time.  You can also use Benadryl over the counter for itching or also to help with sleep.   TED HOSE STOCKINGS:  Use stockings on both legs until for at least 2 weeks or as directed by physician office. They may be removed at night for sleeping.  MEDICATIONS:  See your medication summary on the "After Visit Summary" that nursing will review with you.  You may have some home medications which will be placed on hold until you complete the course of blood thinner medication.  It is important for you to complete the blood thinner medication as prescribed.  PRECAUTIONS:  If you experience chest pain or shortness of breath - call 911 immediately for transfer to the hospital emergency department.   If you develop a fever greater that 101 F, purulent drainage from wound, increased redness or drainage from wound, foul odor from the wound/dressing, or calf pain - CONTACT YOUR SURGEON.                                                   FOLLOW-UP APPOINTMENTS:  If you do not already have a post-op appointment, please call the office for an appointment to be seen by your surgeon.  Guidelines for how soon to be seen are listed in your "After Visit Summary", but are typically between 1-4 weeks after surgery.  OTHER INSTRUCTIONS:   Knee Replacement:  Do not place pillow under knee, focus on keeping the knee straight while resting. CPM instructions: 0-90 degrees, 2 hours in the morning, 2 hours in the afternoon, and 2 hours in the evening. Place foam block, curve side up under heel at all times except when in CPM or when walking.  DO NOT modify, tear, cut, or change the foam block in any way.  POST-OPERATIVE OPIOID TAPER INSTRUCTIONS: It is important to wean off of your opioid medication as soon as possible. If you do not need pain medication after your surgery it is ok to stop day one. Opioids include: Codeine, Hydrocodone(Norco,  Vicodin), Oxycodone(Percocet, oxycontin) and hydromorphone amongst others.  Long term and even short term use of opiods can cause:  Increased pain response Dependence Constipation Depression Respiratory depression And more.  Withdrawal symptoms can include Flu like symptoms Nausea, vomiting And more Techniques to manage these symptoms Hydrate well Eat regular healthy meals Stay active Use relaxation techniques(deep breathing, meditating, yoga) Do Not substitute Alcohol to help with tapering If you have been on opioids for less than two weeks and do not have pain than it is ok to stop all together.  Plan to wean off of opioids This plan should start within one week post op of your joint replacement. Maintain the same interval or time between taking each dose and first decrease the dose.  Cut the total daily intake of opioids by one tablet each day Next start to increase the time between doses. The last dose that should be eliminated is the evening dose.   MAKE SURE YOU:  Understand these instructions.  Get help right away if you are not doing well or get worse.    Thank you for letting us be a part of your medical care team.  It is a privilege we respect greatly.  We hope these instructions will help you stay on track for a fast and full recovery!    Dental Antibiotics:  In most cases prophylactic antibiotics for Dental procdeures after total joint surgery are not necessary.  Exceptions are as follows:  1. History of prior total joint infection  2. Severely immunocompromised (Organ Transplant, cancer chemotherapy, Rheumatoid biologic meds such as Humera)  3. Poorly controlled diabetes (A1C &gt; 8.0, blood glucose over 200)  If you have one of these conditions, contact your surgeon for an antibiotic prescription, prior to your dental procedure.   Increase activity slowly as tolerated   Complete by: As directed    Post-operative opioid taper instructions:   Complete by:  As directed    POST-OPERATIVE OPIOID TAPER INSTRUCTIONS: It is important to wean off of your opioid medication as soon as possible. If you do not need pain medication after your surgery it is ok to stop day one. Opioids include: Codeine, Hydrocodone(Norco, Vicodin), Oxycodone(Percocet, oxycontin) and hydromorphone amongst others.  Long term and even short term use of opiods can cause: Increased pain response Dependence Constipation Depression Respiratory depression And more.  Withdrawal symptoms can include Flu like symptoms Nausea, vomiting And more Techniques to manage these symptoms Hydrate well Eat regular healthy meals Stay active Use relaxation techniques(deep breathing, meditating, yoga) Do Not substitute Alcohol to help with tapering If you have been on opioids for less than two weeks and do not have pain than it is ok to stop all together.  Plan to wean off of opioids This plan should start within one week post op of your joint replacement. Maintain the same interval or time between taking each dose and first decrease the dose.  Cut the total daily intake of opioids by one tablet each day Next start to increase the time between doses. The last dose that should be eliminated is the evening dose.             Signed: Glee Arvin 07/12/2020, 8:00 AM

## 2020-07-13 ENCOUNTER — Telehealth: Payer: Self-pay

## 2020-07-13 NOTE — Telephone Encounter (Signed)
Transition Care Management Unsuccessful Follow-up Telephone Call  Call placed with assistance of [name] [employer] # [id number]/[employer]  Date of discharge and from where:  07/11/2020, [facility]   Attempts:  1st Attempt  Reason for unsuccessful TCM follow-up call:  Left voice message on # [phone]

## 2020-07-14 ENCOUNTER — Telehealth: Payer: Self-pay | Admitting: Orthopedic Surgery

## 2020-07-14 ENCOUNTER — Telehealth: Payer: Self-pay

## 2020-07-14 DIAGNOSIS — M25562 Pain in left knee: Secondary | ICD-10-CM

## 2020-07-14 DIAGNOSIS — Z96651 Presence of right artificial knee joint: Secondary | ICD-10-CM

## 2020-07-14 DIAGNOSIS — Z96652 Presence of left artificial knee joint: Secondary | ICD-10-CM

## 2020-07-14 NOTE — Telephone Encounter (Signed)
Transition Care Management Follow-up Telephone Call  Call placed to patient's phone # [phone] with assistance of [name] [employer] # [id number]/[employer], message left with call back requested to this CM.  Call then placed to patient's community caseworker, Nathen May [phone] who completed the call with this CM.  Date of discharge and from where: 07/11/2020, [facility] How have you been since you were released from the hospital? Early said that the patient is doing better. Early saw the patient yesterday but  has not seen her today.  She usually sees her every other day.  Any questions or concerns? Yes - home health and CPM  - noted below.  Items Reviewed: Did the pt receive and understand the discharge instructions provided? Yes Early did not have the discharge instructions with her. Instructed her to call this office with any questions when she reviews them.  Medications obtained and verified?  Other? Yes  - Early said that the patient has all medications and independently manages her own meds using a pill box. Early did not have any questions about the med regime.  Any new allergies since your discharge? No  Dietary orders reviewed? No Do you have support at home? Yes - her husband and other family members.   Home Care and Equipment/Supplies: Were home health services ordered? Per Early, she thought home health was ordered for 4 visits.   If so, what is the name of the agency? [facility] -   This CM called [facility], spoke to [place] who said that they do not have a referral for patient.  She is uninsured and it was be charity care.  Acceptance was pending when the patient was discharged.   Has the agency set up a time to come to the patient's home? not applicable Were any new equipment or medical supplies ordered?  No What is the name of the medical supply agency? N/a Were you able to get the supplies/equipment? not applicable Do you have any questions related  to the use of the equipment or supplies? No  Early was expecting that patient to have a " machine for her knee"  there are instructions in the AVS that refer to a CPM but one was not ordered Call placed to [facility], spoke to [place] . She took message to inform Dr August Saucer that the patient does not have home health services or CPM.    Functional Questionnaire: (I = Independent and D = Dependent) ADLs: needs assistance.  Husband and family members assisting as needed.  Patient ambulating with a RW. Also has a wheelchair.    Follow up appointments reviewed:  PCP Hospital f/u appt confirmed?  Not schedule at this time.   Early will call to schedule for patient   Specialist Hospital f/u appt confirmed? Yes  Scheduled to see Dr August Saucer  on 07/22/2020 @ 1000. Are transportation arrangements needed? No  If their condition worsens, is the pt aware to call PCP or go to the Emergency Dept.? Yes Was the patient provided with contact information for the PCP's office or ED? Yes Was to pt encouraged to call back with questions or concerns? Yes

## 2020-07-14 NOTE — Telephone Encounter (Signed)
From the discharge call:   Call completed with Early Katrinka Blazing, community case worker.  Early said that the patient is doing better. Early saw the patient yesterday but has not seen her today.  She usually sees her every other day.  Early said that the patient has all medications and independently manages her own meds using a pill box. Early did not have any questions about the med regime.   Early was expecting the patient to have a " machine for her knee"  there are instructions in the AVS that refer to a CPM but one was not ordered  This CM contacted [facility] and the charity referral for home health care was not accepted.   Call placed to [facility], spoke to [place] . She took message to inform Dr August Saucer that the patient does not have home health services or CPM  Appointment with Dr August Saucer - 07/22/2020. They will call to schedule an appointment with Dr Alvis Lemmings

## 2020-07-14 NOTE — Telephone Encounter (Signed)
Received call from [facility] with [facility] advised she did discharge call. Erskine Squibb said patient is charity care. Erskine Squibb said patient do not have a CPM machine at home. Erskine Squibb also said patient does not have any HHPT. The number to contact Erskine Squibb is [phone]

## 2020-07-16 NOTE — Telephone Encounter (Signed)
Can we set patient up for earlier outpatient PT in that case?

## 2020-07-20 ENCOUNTER — Ambulatory Visit: Payer: Self-pay | Admitting: Family

## 2020-07-22 ENCOUNTER — Telehealth: Payer: Self-pay

## 2020-07-22 ENCOUNTER — Ambulatory Visit (INDEPENDENT_AMBULATORY_CARE_PROVIDER_SITE_OTHER): Payer: Self-pay

## 2020-07-22 ENCOUNTER — Ambulatory Visit (INDEPENDENT_AMBULATORY_CARE_PROVIDER_SITE_OTHER): Payer: Self-pay | Admitting: Orthopedic Surgery

## 2020-07-22 DIAGNOSIS — M25562 Pain in left knee: Secondary | ICD-10-CM

## 2020-07-22 MED ORDER — OXYCODONE HCL 5 MG PO TABS: ORAL_TABLET | ORAL | 0 refills | Status: DC

## 2020-07-22 NOTE — Telephone Encounter (Signed)
Patient was seen today her caregiver stated she needs transportation to be set up for therapy she stated the patient will need transportation tomorrow morning,she has a appointment at 8:30 upstairs and she also needs transportation Friday she has a appointment at 9:00 call back:[phone]

## 2020-07-22 NOTE — Progress Notes (Signed)
   Post-Op Visit Note   Patient: [name] [name]           Date of Birth: 07-22-1950           MRN: [id number] Visit Date: 07/22/2020 PCP: Hoy Register, MD   Assessment & Plan:  Chief Complaint:  Chief Complaint  Patient presents with   Other    07/07/20 Left TKA   Visit Diagnoses:  1. Left knee pain, unspecified chronicity     Plan: Patient is now 2 weeks out left total knee replacement for severe varus deformity and arthritis performed 07/07/2020.  She is having some pain and feels like her leg is weak.  She is on Eliquis.  On exam she has range of motion from 5-80.  Alignment intact.  No calf tenderness negative Homans.  Pedal pulses palpable.  Ankle dorsiflexion intact.  Radiographs look good.  Refill oxycodone.  Outpatient therapy here to begin.  3-week return with Franky Macho on a Friday afternoon.  Follow-Up Instructions: Return in about 3 weeks (around 08/12/2020).   Orders:  Orders Placed This Encounter  Procedures   XR Knee 1-2 Views Left   Meds ordered this encounter  Medications   oxyCODONE (OXY IR/ROXICODONE) 5 MG immediate release tablet    Sig: 1 po q 8    Dispense:  30 tablet    Refill:  0    Imaging: No results found.  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 atrial fibrillation    Flu-like symptoms 02/22/2019   Hypertension    Knee osteoarthritis 10/24/2017   Pneumonia     Family History  Family history unknown: Yes    Past Surgical History:  Procedure Laterality Date   NO PAST SURGERIES     TOTAL KNEE ARTHROPLASTY Right 12/03/2019   Procedure: RIGHT TOTAL KNEE ARTHROPLASTY;  Surgeon: Cammy Copa, MD;  Location: [facility] OR;  Service: Orthopedics;  Laterality: Right;   TOTAL KNEE ARTHROPLASTY Left 07/07/2020   Procedure: LEFT TOTAL KNEE ARTHROPLASTY-CEMENTED;  Surgeon: Cammy Copa, MD;  Location: [facility] OR;  Service: Orthopedics;  Laterality: Left;   Social History   Occupational History   Not on file  Tobacco Use   Smoking status: Never   Smokeless tobacco: Never  Vaping Use   Vaping Use: Never used  Substance and Sexual Activity   Alcohol use: Never   Drug use: Never   Sexual activity: Not Currently    Birth control/protection: Post-menopausal

## 2020-07-23 ENCOUNTER — Ambulatory Visit (INDEPENDENT_AMBULATORY_CARE_PROVIDER_SITE_OTHER): Payer: Self-pay | Admitting: Physical Therapy

## 2020-07-23 ENCOUNTER — Encounter: Payer: Self-pay | Admitting: Physical Therapy

## 2020-07-23 ENCOUNTER — Other Ambulatory Visit: Payer: Self-pay

## 2020-07-23 DIAGNOSIS — R600 Localized edema: Secondary | ICD-10-CM

## 2020-07-23 DIAGNOSIS — R2681 Unsteadiness on feet: Secondary | ICD-10-CM

## 2020-07-23 DIAGNOSIS — R262 Difficulty in walking, not elsewhere classified: Secondary | ICD-10-CM

## 2020-07-23 DIAGNOSIS — G8929 Other chronic pain: Secondary | ICD-10-CM

## 2020-07-23 DIAGNOSIS — M25562 Pain in left knee: Secondary | ICD-10-CM

## 2020-07-23 DIAGNOSIS — M6281 Muscle weakness (generalized): Secondary | ICD-10-CM

## 2020-07-23 DIAGNOSIS — M25662 Stiffness of left knee, not elsewhere classified: Secondary | ICD-10-CM

## 2020-07-23 NOTE — Therapy (Signed)
[facility] [address] [place], [place], [address] Phone: [phone]   Fax:  [phone]  Physical Therapy Evaluation  Patient Details  Name: [name] [name] MRN: [id number] Date of Birth: 1950-11-08 Referring Provider (PT): Cammy Copa, MD   Encounter Date: 07/23/2020   PT End of Session - 07/23/20 1108     Visit Number 1    Number of Visits 22    Date for PT Re-Evaluation 09/25/20    Authorization Type [facility] Financial Assistance thru 10/02/20    PT Start Time 0853    PT Stop Time 0940    PT Time Calculation (min) 47 min    Equipment Utilized During Treatment Gait belt    Activity Tolerance Patient tolerated treatment well;Patient limited by fatigue;Patient limited by pain    Behavior During Therapy [facility] for tasks assessed/performed             Past Medical History:  Diagnosis Date   Acute kidney injury (HCC) 03/02/2019   Acute respiratory failure with hypoxia (HCC) 02/22/2019   Atrial fibrillation with RVR (HCC) 02/26/2019   COVID-19 02/22/2019   Dysrhythmia    para. atrial fibrillation   Flu-like symptoms 02/22/2019   Hypertension    Knee osteoarthritis 10/24/2017   Pneumonia     Past Surgical History:  Procedure Laterality Date   NO PAST SURGERIES     TOTAL KNEE ARTHROPLASTY Right 12/03/2019   Procedure: RIGHT TOTAL KNEE ARTHROPLASTY;  Surgeon: Cammy Copa, MD;  Location: [facility] OR;  Service: Orthopedics;  Laterality: Right;   TOTAL KNEE ARTHROPLASTY Left 07/07/2020   Procedure: LEFT TOTAL KNEE ARTHROPLASTY-CEMENTED;  Surgeon: Cammy Copa, MD;  Location: [facility] OR;  Service: Orthopedics;  Laterality: Left;    There were no vitals filed for this visit.    Subjective Assessment - 07/23/20 0900     Subjective This 70yo female was referred to PT by Cammy Copa, MD with [phone] (ICD-10-CM) - History of arthroplasty of left knee which was performed on 07/07/2020.  She had 17 PT sessions following her  right TKA and did well but was limited by left knee end stage arthritis.    Patient is accompained by: Interpreter    Pertinent History right TKA, DM2, A-Fib, HTN, OA, Covid-19    Limitations Lifting;Standing;Walking;House hold activities    Patient Stated Goals she wants to be able to be able find a job looking after children,  stand to cook and walk in community, visiting    Currently in Pain? Yes    Pain Score 10-Worst pain ever   since surgery 6/7, lowest 8/10, worst 10/10   Pain Location Knee    Pain Orientation Left    Pain Descriptors / Indicators Aching;Cramping    Pain Type Surgical pain;Acute pain    Pain Onset 1 to 4 weeks ago    Pain Frequency Constant    Aggravating Factors  moving knee or sitting without foot supported    Pain Relieving Factors medication    Effect of Pain on Daily Activities limits moving knee                [facility] PT Assessment - 07/23/20 0850       Assessment   Medical Diagnosis Z96.652 (ICD-10-CM) - History of arthroplasty of left knee    Referring Provider (PT) Cammy Copa, MD    Onset Date/Surgical Date 07/07/20    Prior Therapy OPPT for Rt TKA      Precautions  Precautions Fall      Balance Screen   Has the patient fallen in the past 6 months No    Has the patient had a decrease in activity level because of a fear of falling?  No    Is the patient reluctant to leave their home because of a fear of falling?  Yes      Home Environment   Living Environment Private residence    Living Arrangements Spouse/significant other    Type of Home House    Home Access Stairs to enter    Entrance Stairs-Number of Steps 4    Entrance Stairs-Rails Right;Left;Cannot reach both    Home Layout Two level;Able to live on main level with bedroom/bathroom    Home Equipment Walker - 2 wheels;Cane - single point;Bedside commode      Prior Function   Level of Independence Independent;Independent with household mobility with device;Independent with  community mobility with device      Observation/Other Assessments-Edema    Edema Circumferential      Circumferential Edema   Circumferential - Right 2" proximal patella 22" & 1" distal to patella 16.5"    Circumferential - Left  2" proximal patella 26" & 1" distal to patella 17.5"      ROM / Strength   AROM / PROM / Strength PROM;AROM;Strength      AROM   Overall AROM  Deficits    AROM Assessment Site Knee    Right/Left Knee Right;Left    Right Knee Extension 0    Right Knee Flexion 104   supine   Left Knee Flexion --   unable to actively flex knee in supine or sitting. hamstring contraction palpated.     PROM   PROM Assessment Site Knee    Right/Left Knee Right;Left    Right Knee Extension 0    Right Knee Flexion 105    Left Knee Extension -6   supine   Left Knee Flexion 52   52* seated & 48* supine.  Gaurding at end range.     Strength   Overall Strength Deficits    Strength Assessment Site Knee    Left Knee Flexion 2-/5    Left Knee Extension 2-/5      Transfers   Transfers Stand Pivot Transfers    Stand Pivot Transfers 5: Supervision   requires BUE support                       Objective measurements completed on examination: See above findings.       [facility] Adult PT Treatment/Exercise - 07/23/20 0850       Modalities   Modalities Vasopneumatic      Vasopneumatic   Number Minutes Vasopneumatic  10 minutes    Vasopnuematic Location  Knee    Vasopneumatic Pressure Medium    Vasopneumatic Temperature  34                      PT Short Term Goals - 07/23/20 1116       PT SHORT TERM GOAL #1   Title Pt will be I with initial HEP for knee AROM and strength    Time 1    Period Months    Status New    Target Date 08/21/20      PT SHORT TERM GOAL #2   Title Pt will be able to perform SLR on left LE without assist for improved sit to supine  Time 1    Period Months    Status New    Target Date 08/21/20      PT SHORT TERM  GOAL #3   Title Pt will report less pain in left knee when sitting for improved comfort and rest at night    Time 1    Period Months    Status New    Target Date 08/21/20               PT Long Term Goals - 07/23/20 1113       PT LONG TERM GOAL #1   Title Patient will demonstrate/report pain at worst less than or equal to 2/10 to facilitate minimal limitation in daily activity secondary to pain symptoms.    Time 10    Period Weeks    Status New    Target Date 09/25/20      PT LONG TERM GOAL #2   Title Patient will demonstrate independent use of home exercise program to facilitate ability to maintain/progress functional gains from skilled physical therapy services.    Time 10    Period Weeks    Status New    Target Date 09/25/20      PT LONG TERM GOAL #3   Title Patient will demonstrate independent ambulation community distances > 300 ft to facilitate community integration at [facility].    Time 10    Period Weeks    Status New    Target Date 09/25/20      PT LONG TERM GOAL #4   Title Pt. will demonstrate Left knee AORM 0-100 deg to facilitate ability to perform transfers, walking, stairs at PLOF s limitation.    Time 10    Period Weeks    Status New    Target Date 09/25/20      PT LONG TERM GOAL #5   Title Pt. will demonstrate left knee MMT >/= 4/5 throughout to facilitate independent ambulation, stair navigation, transfers at PLOF.    Time 10    Period Weeks    Status New    Target Date 09/25/20                    Plan - 07/23/20 1118     Clinical Impression Statement Patient is a 69y.o. female who comes to clinic with complaints of left knee pain s/p left TKA with mobility, strength and movement coordination deficits that impair their ability to perform usual daily and recreational functional activities without increase difficulty/symptoms at this time.  Patient to benefit from skilled PT services to address impairments and limitations to improve to  previous level of function without restriction secondary to condition.    Personal Factors and Comorbidities Age;Comorbidity 3+;Transportation    Comorbidities right TKA, DM2, A-Fib, HTN, OA, Covid-19    Examination-Activity Limitations Bed Mobility;Locomotion Level;Stairs;Stand;Transfers    Examination-Participation Restrictions Community Activity    Stability/Clinical Decision Making Stable/Uncomplicated    Clinical Decision Making Low    Rehab Potential Good    PT Frequency 3x / week   2x first week, 3x/wk for 2 weeks, then 2x/wk for 7 weeks   PT Duration Other (comment)   10 weeks   PT Treatment/Interventions ADLs/Self Care Home Management;Electrical Stimulation;Moist Heat;Cryotherapy;DME Instruction;Gait training;Stair training;Functional mobility training;Therapeutic activities;Therapeutic exercise;Balance training;Neuromuscular re-education;Patient/family education;Manual techniques;Scar mobilization;Passive range of motion;Vasopneumatic Device;Joint Manipulations    PT Next Visit Plan instruct in HEP,  manual therapy to increase ROM of left knee,  vaso to end session.  Consulted and Agree with Plan of Care Patient             Patient will benefit from skilled therapeutic intervention in order to improve the following deficits and impairments:  Abnormal gait, Decreased activity tolerance, Decreased balance, Decreased endurance, Decreased coordination, Decreased knowledge of use of DME, Decreased mobility, Decreased range of motion, Decreased skin integrity, Decreased scar mobility, Decreased strength, Increased edema, Postural dysfunction, Obesity, Pain  Visit Diagnosis: Muscle weakness (generalized)  Difficulty in walking, not elsewhere classified  Localized edema  Chronic pain of left knee  Stiffness of left knee, not elsewhere classified  Unsteadiness on feet     Problem List Patient Active Problem List   Diagnosis Date Noted   Arthritis of left knee    S/P TKR  (total knee replacement), 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   Conjunctival hemorrhage of right eye 03/25/2020   Chronic anticoagulation 03/25/2020   S/P total knee arthroplasty 12/04/2019   S/P knee surgery 12/03/2019   Type 2 diabetes mellitus with obesity (HCC) 07/22/2019   Chronic atrial fibrillation (HCC) 02/26/2019   Essential hypertension, benign 05/03/2018   Knee osteoarthritis 10/24/2017    Vladimir Fasterobin Shadee Montoya,, PT, DPT 07/23/2020, 11:25 AM  [facility] [address] [place], [place], [address] Phone: [phone]   Fax:  [phone]  Name: Avelina LaineVeronique Kabika Kalala MRN: [id number] Date of Birth: [birth year]

## 2020-07-24 ENCOUNTER — Encounter: Payer: Self-pay | Admitting: Physical Therapy

## 2020-07-24 ENCOUNTER — Ambulatory Visit (INDEPENDENT_AMBULATORY_CARE_PROVIDER_SITE_OTHER): Payer: Self-pay | Admitting: Physical Therapy

## 2020-07-24 DIAGNOSIS — R2681 Unsteadiness on feet: Secondary | ICD-10-CM

## 2020-07-24 DIAGNOSIS — M25562 Pain in left knee: Secondary | ICD-10-CM

## 2020-07-24 DIAGNOSIS — G8929 Other chronic pain: Secondary | ICD-10-CM

## 2020-07-24 DIAGNOSIS — R262 Difficulty in walking, not elsewhere classified: Secondary | ICD-10-CM

## 2020-07-24 DIAGNOSIS — R600 Localized edema: Secondary | ICD-10-CM

## 2020-07-24 DIAGNOSIS — M25662 Stiffness of left knee, not elsewhere classified: Secondary | ICD-10-CM

## 2020-07-24 DIAGNOSIS — M6281 Muscle weakness (generalized): Secondary | ICD-10-CM

## 2020-07-24 NOTE — Patient Instructions (Signed)
Access Code: [email or web address] URL: [email or web address] Date: 07/24/2020 Prepared by: Moshe Cipro  Exercises Seated Knee Flexion AAROM - 3-5 x daily - 7 x weekly - 1 sets - 10 reps - 10 sec hold Seated Long Arc Quad - 3-5 x daily - 7 x weekly - 1-2 sets - 10 reps - 3-5 sec hold Supine Heel Slide with Strap - 3-5 x daily - 7 x weekly - 1-2 sets - 10 reps - 3-5 sec hold

## 2020-07-24 NOTE — Therapy (Signed)
[facility] Therapy [address] [place], [place], [address] Phone: [phone]   Fax:  [phone]  Physical Therapy Treatment  Patient Details  Name: [name] [name] MRN: [id number] Date of Birth: [birth year] Referring Provider (PT): Meredith Pel, MD   Encounter Date: 07/24/2020   PT End of Session - 07/24/20 1006     Visit Number 2    Number of Visits 22    Date for PT Re-Evaluation 09/25/20    Authorization Type [facility] Financial Assistance thru 10/02/20    PT Start Time 0927    PT Stop Time 1015    PT Time Calculation (min) 48 min    Equipment Utilized During Treatment Gait belt    Activity Tolerance Patient tolerated treatment well;Patient limited by fatigue;Patient limited by pain    Behavior During Therapy [facility] for tasks assessed/performed             Past Medical History:  Diagnosis Date   Acute kidney injury ([place]) 03/02/2019   Acute respiratory failure with hypoxia ([place]) 02/22/2019   Atrial fibrillation with RVR ([place]) 02/26/2019   COVID-19 02/22/2019   Dysrhythmia    para. atrial fibrillation   Flu-like symptoms 02/22/2019   Hypertension    Knee osteoarthritis 10/24/2017   Pneumonia     Past Surgical History:  Procedure Laterality Date   NO PAST SURGERIES     TOTAL KNEE ARTHROPLASTY Right 12/03/2019   Procedure: RIGHT TOTAL KNEE ARTHROPLASTY;  Surgeon: Meredith Pel, MD;  Location: [place];  Service: Orthopedics;  Laterality: Right;   TOTAL KNEE ARTHROPLASTY Left 07/07/2020   Procedure: LEFT TOTAL KNEE ARTHROPLASTY-CEMENTED;  Surgeon: Meredith Pel, MD;  Location: [place];  Service: Orthopedics;  Laterality: Left;    There were no vitals filed for this visit.   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 Yes    Pain Score 8     Pain Location Knee    Pain Orientation Left    Pain Descriptors / Indicators Aching;Cramping    Pain Type Acute pain;Surgical pain;Chronic pain    Pain Onset 1 to 4 weeks ago    Pain Frequency Constant    Aggravating Factors  moving, bending knee    Pain Relieving Factors meds                               [facility] Adult PT Treatment/Exercise - 07/24/20 0932       Ambulation/Gait   Gait Comments amb in clinic with RW with supervision - no evidence of instability; encouraged walking with RW at home at all times to work on increasing activity      Exercises   Exercises Knee/Hip      Knee/Hip Exercises: Stretches   Knee: Self-Stretch Limitations LLE with RLE overpressure 10 x 10 sec hold      Knee/Hip Exercises: Aerobic   Nustep L6 x 10 min      Knee/Hip Exercises: Seated   Long Arc Quad Left;3  sets;10 reps    Other Seated Knee/Hip Exercises seated tailgate flexion x 2 min      Knee/Hip Exercises: Supine   Quad Sets Left;10 reps    Heel Slides AAROM;Left;10 reps                    PT Education - 07/24/20 1006     Education Details HEP, need to increase activity at home    Person(s) Educated Patient    Methods Explanation;Demonstration;Handout    Comprehension Verbalized understanding;Returned demonstration;Need further instruction              PT Short Term Goals - 07/23/20 1116       PT SHORT TERM GOAL #1   Title Pt will be I with initial HEP for knee AROM and strength    Time 1    Period Months    Status New    Target Date 08/21/20      PT SHORT TERM GOAL #2   Title Pt will be able to perform SLR on left LE without assist for improved sit to supine    Time 1    Period Months    Status New     Target Date 08/21/20      PT SHORT TERM GOAL #3   Title Pt will report less pain in left knee when sitting for improved comfort and rest at night    Time 1    Period Months    Status New    Target Date 08/21/20               PT Long Term Goals - 07/23/20 1113       PT LONG TERM GOAL #1   Title Patient will demonstrate/report pain at worst less than or equal to 2/10 to facilitate minimal limitation in daily activity secondary to pain symptoms.    Time 10    Period Weeks    Status New    Target Date 09/25/20      PT LONG TERM GOAL #2   Title Patient will demonstrate independent use of home exercise program to facilitate ability to maintain/progress functional gains from skilled physical therapy services.    Time 10    Period Weeks    Status New    Target Date 09/25/20      PT LONG TERM GOAL #3   Title Patient will demonstrate independent ambulation community distances > 300 ft to facilitate community integration at [facility] [place] [facility].    Time 10    Period Weeks    Status New    Target Date 09/25/20      PT LONG TERM GOAL #4   Title Pt. will demonstrate Left knee AORM 0-100 deg to facilitate ability to perform transfers, walking, stairs at PLOF s limitation.    Time 10    Period Weeks    Status New    Target Date 09/25/20      PT LONG TERM GOAL #5   Title Pt. will demonstrate left knee MMT >/= 4/5 throughout to facilitate independent ambulation, stair navigation, transfers at PLOF.    Time 10    Period Weeks    Status New    Target Date 09/25/20                   Plan - 07/24/20 1000     Clinical Impression Statement Pt returned to PT one day after initial eval, and no goals met to date due to limited  time.  Session focused on establishing basic HEP for pt to perform at home, and amb with RW.  Encouraged increased activity as she is limiting activity at home due to pain and "heaviness" of LLE.  Explained that increasing activity will help with both and promote better  long term outcomes.  Will continu eto benefit from PT to maximize function.    Personal Factors and Comorbidities Age;Comorbidity 3+;Transportation    Comorbidities right TKA, DM2, A-Fib, HTN, OA, Covid-19    Examination-Activity Limitations Bed Mobility;Locomotion Level;Stairs;Stand;Transfers    Examination-Participation Restrictions Community Activity    Stability/Clinical Decision Making Stable/Uncomplicated    Rehab Potential Good    PT Frequency 3x / week   2x first week, 3x/wk for 2 weeks, then 2x/wk for 7 weeks   PT Duration Other (comment)   10 weeks   PT Treatment/Interventions ADLs/Self Care Home Management;Electrical Stimulation;Moist Heat;Cryotherapy;DME Instruction;Gait training;Stair training;Functional mobility training;Therapeutic activities;Therapeutic exercise;Balance training;Neuromuscular re-education;Patient/family education;Manual techniques;Scar mobilization;Passive range of motion;Vasopneumatic Device;Joint Manipulations    PT Next Visit Plan review HEP,  manual therapy to increase ROM of left knee,  vaso to end session.    PT Home Exercise Plan Access Code: [email or web address]    Consulted and Agree with Plan of Care Patient             Patient will benefit from skilled therapeutic intervention in order to improve the following deficits and impairments:  Abnormal gait, Decreased activity tolerance, Decreased balance, Decreased endurance, Decreased coordination, Decreased knowledge of use of DME, Decreased mobility, Decreased range of motion, Decreased skin integrity, Decreased scar mobility, Decreased strength, Increased edema, Postural dysfunction, Obesity, Pain  Visit Diagnosis: Muscle weakness (generalized)  Difficulty in walking, not elsewhere classified  Localized edema  Chronic pain of left knee  Stiffness of left knee, not elsewhere classified  Unsteadiness on feet     Problem List Patient Active Problem List   Diagnosis Date Noted   Arthritis of left  knee    S/P TKR (total knee replacement), 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   Conjunctival hemorrhage of right eye 03/25/2020   Chronic anticoagulation 03/25/2020   S/P total knee arthroplasty 12/04/2019   S/P knee surgery 12/03/2019   Type 2 diabetes mellitus with obesity ([place]) 07/22/2019   Chronic atrial fibrillation ([place]) 02/26/2019   Essential hypertension, benign 05/03/2018   Knee osteoarthritis 10/24/2017      Laureen Abrahams, PT, DPT 07/24/20 10:09 AM      [facility] [address] [place], [place], [address] Phone: [phone]   Fax:  [phone]  Name: [name] [name] MRN: [id number] Date of Birth: Jan 06, 1951

## 2020-07-26 ENCOUNTER — Encounter: Payer: Self-pay | Admitting: Orthopedic Surgery

## 2020-07-27 ENCOUNTER — Other Ambulatory Visit: Payer: Self-pay

## 2020-07-27 ENCOUNTER — Ambulatory Visit (INDEPENDENT_AMBULATORY_CARE_PROVIDER_SITE_OTHER): Payer: Self-pay | Admitting: Rehabilitative and Restorative Service Providers"

## 2020-07-27 ENCOUNTER — Encounter: Payer: Self-pay | Admitting: Rehabilitative and Restorative Service Providers"

## 2020-07-27 DIAGNOSIS — M25562 Pain in left knee: Secondary | ICD-10-CM

## 2020-07-27 DIAGNOSIS — R600 Localized edema: Secondary | ICD-10-CM

## 2020-07-27 DIAGNOSIS — R262 Difficulty in walking, not elsewhere classified: Secondary | ICD-10-CM

## 2020-07-27 DIAGNOSIS — M25662 Stiffness of left knee, not elsewhere classified: Secondary | ICD-10-CM

## 2020-07-27 DIAGNOSIS — G8929 Other chronic pain: Secondary | ICD-10-CM

## 2020-07-27 DIAGNOSIS — M6281 Muscle weakness (generalized): Secondary | ICD-10-CM

## 2020-07-27 NOTE — Patient Instructions (Signed)
Continue current HEP focus on quadriceps sets and knee flexion AROM/AAROM exercises

## 2020-07-27 NOTE — Therapy (Signed)
[facility] [address] [place], [place], [address] Phone: [phone]   Fax:  [phone]  Physical Therapy Treatment  Patient Details  Name: [name] [name] MRN: [id number] Date of Birth: 26-Apr-1950 Referring Provider (PT): Meredith Pel, MD   Encounter Date: 07/27/2020   PT End of Session - 07/27/20 1122     Visit Number 3    Number of Visits 22    Date for PT Re-Evaluation 09/25/20    Authorization Type [facility] [employer] Assistance thru 10/02/20    PT Start Time 0847    PT Stop Time 0940    PT Time Calculation (min) 53 min    Activity Tolerance Patient tolerated treatment well;Patient limited by fatigue;Patient limited by pain    Behavior During Therapy [facility] for tasks assessed/performed             Past Medical History:  Diagnosis Date   Acute kidney injury ([place]) 03/02/2019   Acute respiratory failure with hypoxia ([place]) 02/22/2019   Atrial fibrillation with RVR ([place]) 02/26/2019   COVID-19 02/22/2019   Dysrhythmia    para. atrial fibrillation   Flu-like symptoms 02/22/2019   Hypertension    Knee osteoarthritis 10/24/2017   Pneumonia     Past Surgical History:  Procedure Laterality Date   NO PAST SURGERIES     TOTAL KNEE ARTHROPLASTY Right 12/03/2019   Procedure: RIGHT TOTAL KNEE ARTHROPLASTY;  Surgeon: Meredith Pel, MD;  Location: [place];  Service: Orthopedics;  Laterality: Right;   TOTAL KNEE ARTHROPLASTY Left 07/07/2020   Procedure: LEFT TOTAL KNEE ARTHROPLASTY-CEMENTED;  Surgeon: Meredith Pel, MD;  Location: [place];  Service: Orthopedics;  Laterality: Left;    There were no vitals filed for this visit.   Subjective Assessment - 07/27/20 0855     Subjective [name] reports early HEP compliance.    Patient is accompained by: Interpreter    Pertinent History right TKA, DM2, A-Fib, HTN, OA, Covid-19    Limitations Lifting;Standing;Walking;House hold activities    How long can you sit comfortably?  Start-up stiffness    How long can you stand comfortably? 5 minutes    How long can you walk comfortably? 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 Yes    Pain Score 8     Pain Location Knee    Pain Orientation Left    Pain Descriptors / Indicators Aching;Tightness    Pain Type Chronic pain;Surgical pain    Pain Onset 1 to 4 weeks ago    Pain Frequency Constant    Aggravating Factors  Movement and prolonged postures    Pain Relieving Factors Medication    Effect of Pain on Daily Activities Can only stand and walk for short periods of time    Multiple Pain Sites No                               [facility] Adult PT Treatment/Exercise - 07/27/20 0001       Exercises   Exercises Knee/Hip      Knee/Hip Exercises: Stretches   Knee: Self-Stretch Limitations LLE with RLE overpressure 10 x 10 sec hold      Knee/Hip Exercises: Aerobic   Nustep L6 x 10 min      Knee/Hip Exercises: Machines for  Strengthening   Total Gym Leg Press 75# 10X full extension to as much flexion as possible with slow eccentrics      Knee/Hip Exercises: Seated   Other Seated Knee/Hip Exercises Seated tailgate knee flexion 2 minutes emphasis on pull (flexion)    Other Seated Knee/Hip Exercises --      Knee/Hip Exercises: Supine   Quad Sets Strengthening;Both;2 sets;10 reps;Limitations    Quad Sets Limitations 5 seconds (toes back, press knees down, tighten thigh)    Heel Slides AAROM;Left;1 set;5 reps;Limitations    Heel Slides Limitations 10 seconds      Modalities   Modalities Vasopneumatic      Vasopneumatic   Number Minutes Vasopneumatic  10 minutes    Vasopnuematic Location  Knee    Vasopneumatic Pressure Medium    Vasopneumatic Temperature  34                    PT Education - 07/27/20 1120     Education Details Review HEP with emphasis on  knee flexion AROM and knee extension strength    Person(s) Educated Patient    Methods Explanation;Demonstration;Tactile cues;Verbal cues    Comprehension Verbalized understanding;Tactile cues required;Need further instruction;Returned demonstration;Verbal cues required              PT Short Term Goals - 07/27/20 1120       PT SHORT TERM GOAL #1   Title Pt will be I with initial HEP for knee AROM and strength    Time 1    Period Months    Status On-going    Target Date 08/21/20      PT SHORT TERM GOAL #2   Title Pt will be able to perform SLR on left LE without assist for improved sit to supine    Time 1    Period Months    Status On-going    Target Date 08/21/20      PT SHORT TERM GOAL #3   Title Pt will report less pain in left knee when sitting for improved comfort and rest at night    Time 1    Period Months    Status On-going    Target Date 08/21/20               PT Long Term Goals - 07/27/20 1121       PT LONG TERM GOAL #1   Title Patient will demonstrate/report pain at worst less than or equal to 2/10 to facilitate minimal limitation in daily activity secondary to pain symptoms.    Time 10    Period Weeks    Status On-going      PT LONG TERM GOAL #2   Title Patient will demonstrate independent use of home exercise program to facilitate ability to maintain/progress functional gains from skilled physical therapy services.    Time 10    Period Weeks    Status On-going      PT LONG TERM GOAL #3   Title Patient will demonstrate independent ambulation community distances > 300 ft to facilitate community integration at [facility].    Time 10    Period Weeks    Status On-going      PT LONG TERM GOAL #4   Title Pt. will demonstrate Left knee AORM 0-100 deg to facilitate ability to perform transfers, walking, stairs at PLOF s limitation.    Baseline Extension is great, flexion very self-limited    Time 10    Period Weeks  Status Partially Met      PT  LONG TERM GOAL #5   Title Pt. will demonstrate left knee MMT >/= 4/5 throughout to facilitate independent ambulation, stair navigation, transfers at PLOF.    Time 10    Period Weeks    Status On-going                   Plan - 07/27/20 1122     Clinical Impression Statement Jeanell has excellent knee extension AROM and very limited L knee flexion AROM and strength.  She is pain-limited and was encouraged repeatedly to do as much as she can as the knee is going to hurt (at this point post-surgery).  With consistent HEP compliance, attendance in PT and adequate effort, she will meet LTGs established at evaluation.  With pain-limited participation, flexion AROM will be a more difficult process.    Personal Factors and Comorbidities Age;Comorbidity 3+;Transportation    Comorbidities right TKA, DM2, A-Fib, HTN, OA, Covid-19    Examination-Activity Limitations Bed Mobility;Locomotion Level;Stairs;Stand;Transfers    Examination-Participation Restrictions Community Activity    Stability/Clinical Decision Making Stable/Uncomplicated    Rehab Potential Good    PT Frequency 3x / week   2x first week, 3x/wk for 2 weeks, then 2x/wk for 7 weeks   PT Duration Other (comment)   10 weeks   PT Treatment/Interventions ADLs/Self Care Home Management;Electrical Stimulation;Moist Heat;Cryotherapy;DME Instruction;Gait training;Stair training;Functional mobility training;Therapeutic activities;Therapeutic exercise;Balance training;Neuromuscular re-education;Patient/family education;Manual techniques;Scar mobilization;Passive range of motion;Vasopneumatic Device;Joint Manipulations    PT Next Visit Plan Flexion AROM and quadriceps strength    PT Home Exercise Plan Access Code: [email or web address]    Consulted and Agree with Plan of Care Patient             Patient will benefit from skilled therapeutic intervention in order to improve the following deficits and impairments:  Abnormal gait, Decreased activity  tolerance, Decreased balance, Decreased endurance, Decreased coordination, Decreased knowledge of use of DME, Decreased mobility, Decreased range of motion, Decreased skin integrity, Decreased scar mobility, Decreased strength, Increased edema, Postural dysfunction, Obesity, Pain  Visit Diagnosis: Difficulty in walking, not elsewhere classified  Muscle weakness (generalized)  Localized edema  Chronic pain of left knee  Stiffness of left knee, not elsewhere classified     Problem List Patient Active Problem List   Diagnosis Date Noted   Arthritis of left knee    S/P TKR (total knee replacement), 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 [place], [place], [address] Phone: [phone]   Fax:  [phone]  Name: [name] [name] MRN: [id number] Date of Birth: 17-Oct-1950

## 2020-07-28 ENCOUNTER — Ambulatory Visit (INDEPENDENT_AMBULATORY_CARE_PROVIDER_SITE_OTHER): Payer: Self-pay | Admitting: Physical Therapy

## 2020-07-28 ENCOUNTER — Encounter: Payer: Self-pay | Admitting: Physical Therapy

## 2020-07-28 DIAGNOSIS — R600 Localized edema: Secondary | ICD-10-CM

## 2020-07-28 DIAGNOSIS — R262 Difficulty in walking, not elsewhere classified: Secondary | ICD-10-CM

## 2020-07-28 DIAGNOSIS — M6281 Muscle weakness (generalized): Secondary | ICD-10-CM

## 2020-07-28 DIAGNOSIS — M25662 Stiffness of left knee, not elsewhere classified: Secondary | ICD-10-CM

## 2020-07-28 NOTE — Patient Instructions (Signed)
Access Code: [email or web address] URL: [email or web address] Date: 07/28/2020 Prepared by: Zebedee Iba  Exercises Seated Knee Flexion AAROM - 3-5 x daily - 7 x weekly - 1 sets - 10 reps - 10 sec hold Seated Long Arc Quad - 3-5 x daily - 7 x weekly - 1-2 sets - 10 reps - 3-5 sec hold Supine Heel Slide with Strap - 3-5 x daily - 7 x weekly - 1-2 sets - 10 reps - 3-5 sec hold Supine Quad Set - 3-5 x daily - 7 x weekly - 2 sets - 10 reps - 3-5 hold

## 2020-07-28 NOTE — Therapy (Signed)
[facility] [address] [place], [place], [address] Phone: [phone]   Fax:  [phone]  Physical Therapy Treatment  Patient Details  Name: [name] [name] MRN: [id number] Date of Birth: 02-07-1950 Referring Provider (PT): Meredith Pel, MD   Encounter Date: 07/28/2020   PT End of Session - 07/28/20 0826     Visit Number 4    Number of Visits 22    Date for PT Re-Evaluation 09/25/20    Authorization Type [facility] [employer] Assistance thru 10/02/20    PT Start Time 0842    PT Stop Time 0930    PT Time Calculation (min) 48 min    Activity Tolerance Patient tolerated treatment well;Patient limited by fatigue;Patient limited by pain    Behavior During Therapy [facility] for tasks assessed/performed             Past Medical History:  Diagnosis Date   Acute kidney injury ([place]) 03/02/2019   Acute respiratory failure with hypoxia ([place]) 02/22/2019   Atrial fibrillation with RVR ([place]) 02/26/2019   COVID-19 02/22/2019   Dysrhythmia    para. atrial fibrillation   Flu-like symptoms 02/22/2019   Hypertension    Knee osteoarthritis 10/24/2017   Pneumonia     Past Surgical History:  Procedure Laterality Date   NO PAST SURGERIES     TOTAL KNEE ARTHROPLASTY Right 12/03/2019   Procedure: RIGHT TOTAL KNEE ARTHROPLASTY;  Surgeon: Meredith Pel, MD;  Location: [place];  Service: Orthopedics;  Laterality: Right;   TOTAL KNEE ARTHROPLASTY Left 07/07/2020   Procedure: LEFT TOTAL KNEE ARTHROPLASTY-CEMENTED;  Surgeon: Meredith Pel, MD;  Location: [place];  Service: Orthopedics;  Laterality: Left;    There were no vitals filed for this visit.   Subjective Assessment - 07/28/20 0824     Subjective Pt states she she is doing her HEP and she is starting to see "changes in her body."    Patient is accompained by: Interpreter    Pertinent History right TKA, DM2, A-Fib, HTN, OA, Covid-19    Limitations Lifting;Standing;Walking;House hold activities     How long can you sit comfortably? Start-up stiffness    How long can you stand comfortably? 5 minutes    How long can you walk comfortably? 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 Yes    Pain Score 7     Pain Location Knee    Pain Orientation Left    Pain Descriptors / Indicators Aching;Sore;Tightness    Pain Type Surgical pain    Pain Onset 1 to 4 weeks ago                               [facility] Adult PT Treatment/Exercise - 07/28/20 0001       Exercises   Exercises Knee/Hip               Knee/Hip Exercises: Aerobic   Nustep L6 x 10 min      Knee/Hip Exercises: Machines for Strengthening   Total Gym Leg Press --      Knee/Hip Exercises: Seated   Other Seated Knee/Hip Exercises Seated tailgate knee flexion 10x 10s emphasis on pull (flexion)    Sit to Sand 5 reps;with UE support   raised table height, focus on equal  WB and equal hip/knee ext     Knee/Hip Exercises: Supine   Quad Sets Both;10 reps;Limitations    Heel Slides AAROM;Left;1 set;5 reps;Limitations    Heel Slides Limitations 10 seconds   strap     Modalities   Modalities Vasopneumatic      Vasopneumatic   Number Minutes Vasopneumatic  10 minutes    Vasopnuematic Location  Knee    Vasopneumatic Pressure Medium    Vasopneumatic Temperature  34      Manual Therapy   Manual Therapy Joint mobilization    Joint Mobilization grade II-III knee flexion mob, AP tib plateau                    PT Education - 07/28/20 0916     Education Details anatomy, exercise progression, DOMS expectations, ROM requirements, HEP, joint protection, transfer mechancis/safety, AD usage    Person(s) Educated Patient    Methods Explanation;Demonstration;Tactile cues;Verbal cues;Handout    Comprehension Verbalized understanding;Returned demonstration;Verbal cues  required;Tactile cues required              PT Short Term Goals - 07/27/20 1120       PT SHORT TERM GOAL #1   Title Pt will be I with initial HEP for knee AROM and strength    Time 1    Period Months    Status On-going    Target Date 08/21/20      PT SHORT TERM GOAL #2   Title Pt will be able to perform SLR on left LE without assist for improved sit to supine    Time 1    Period Months    Status On-going    Target Date 08/21/20      PT SHORT TERM GOAL #3   Title Pt will report less pain in left knee when sitting for improved comfort and rest at night    Time 1    Period Months    Status On-going    Target Date 08/21/20               PT Long Term Goals - 07/27/20 1121       PT LONG TERM GOAL #1   Title Patient will demonstrate/report pain at worst less than or equal to 2/10 to facilitate minimal limitation in daily activity secondary to pain symptoms.    Time 10    Period Weeks    Status On-going      PT LONG TERM GOAL #2   Title Patient will demonstrate independent use of home exercise program to facilitate ability to maintain/progress functional gains from skilled physical therapy services.    Time 10    Period Weeks    Status On-going      PT LONG TERM GOAL #3   Title Patient will demonstrate independent ambulation community distances > 300 ft to facilitate community integration at [facility].    Time 10    Period Weeks    Status On-going      PT LONG TERM GOAL #4   Title Pt. will demonstrate Left knee AORM 0-100 deg to facilitate ability to perform transfers, walking, stairs at PLOF s limitation.    Baseline Extension is great, flexion very self-limited    Time 10    Period Weeks    Status Partially Met      PT LONG TERM GOAL #5   Title Pt. will demonstrate left knee MMT >/= 4/5 throughout to facilitate independent ambulation, stair navigation, transfers at PLOF.  Time 10    Period Weeks    Status On-going                   Plan -  07/28/20 0827     Clinical Impression Statement Pt with continued knee flexion ROM deficits as well as hip flexion and knee extension strength deficits. Pt unable to perform seated transfers/lifting L LE without UE assist. Pt given signficant cuing for use of LE without assist in order to facilitate better mobility and independence with ADL, transfer, and household activity. Pt instructed on STS mechanics and set up. Pt will need further instruction and reinforcement for set up, safety, and equal WB through hip and knee extension. Pt would benefit from continued skilled therapy in order to reach goals and maximize functional L LE strength and ROM for full return to PLOF.    Personal Factors and Comorbidities Age;Comorbidity 3+;Transportation    Comorbidities right TKA, DM2, A-Fib, HTN, OA, Covid-19    Examination-Activity Limitations Bed Mobility;Locomotion Level;Stairs;Stand;Transfers    Examination-Participation Restrictions Community Activity    Stability/Clinical Decision Making Stable/Uncomplicated    Rehab Potential Good    PT Frequency 3x / week   2x first week, 3x/wk for 2 weeks, then 2x/wk for 7 weeks   PT Duration Other (comment)   10 weeks   PT Treatment/Interventions ADLs/Self Care Home Management;Electrical Stimulation;Moist Heat;Cryotherapy;DME Instruction;Gait training;Stair training;Functional mobility training;Therapeutic activities;Therapeutic exercise;Balance training;Neuromuscular re-education;Patient/family education;Manual techniques;Scar mobilization;Passive range of motion;Vasopneumatic Device;Joint Manipulations    PT Next Visit Plan flexion AROM, quadriceps strength, gait training with SPC, edu about SPC in home and PRN use of walker outdoors    PT Home Exercise Plan Access Code: [email or web address]    Consulted and Agree with Plan of Care Patient             Patient will benefit from skilled therapeutic intervention in order to improve the following deficits and impairments:   Abnormal gait, Decreased activity tolerance, Decreased balance, Decreased endurance, Decreased coordination, Decreased knowledge of use of DME, Decreased mobility, Decreased range of motion, Decreased skin integrity, Decreased scar mobility, Decreased strength, Increased edema, Postural dysfunction, Obesity, Pain  Visit Diagnosis: Difficulty in walking, not elsewhere classified  Muscle weakness (generalized)  Localized edema  Stiffness of left knee, not elsewhere classified     Problem List Patient Active Problem List   Diagnosis Date Noted   Arthritis of left knee    S/P TKR (total knee replacement), 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

## 2020-07-30 ENCOUNTER — Other Ambulatory Visit: Payer: Self-pay

## 2020-07-30 ENCOUNTER — Ambulatory Visit (INDEPENDENT_AMBULATORY_CARE_PROVIDER_SITE_OTHER): Payer: Self-pay | Admitting: Rehabilitative and Restorative Service Providers"

## 2020-07-30 ENCOUNTER — Encounter: Payer: Self-pay | Admitting: Rehabilitative and Restorative Service Providers"

## 2020-07-30 DIAGNOSIS — M25662 Stiffness of left knee, not elsewhere classified: Secondary | ICD-10-CM

## 2020-07-30 DIAGNOSIS — M6281 Muscle weakness (generalized): Secondary | ICD-10-CM

## 2020-07-30 DIAGNOSIS — R600 Localized edema: Secondary | ICD-10-CM

## 2020-07-30 DIAGNOSIS — G8929 Other chronic pain: Secondary | ICD-10-CM

## 2020-07-30 DIAGNOSIS — M25562 Pain in left knee: Secondary | ICD-10-CM

## 2020-07-30 DIAGNOSIS — R262 Difficulty in walking, not elsewhere classified: Secondary | ICD-10-CM

## 2020-07-30 NOTE — Therapy (Signed)
[facility] Therapy [address] [place], [place], [address] Phone: [phone]   Fax:  [phone]  Physical Therapy Treatment  Patient Details  Name: [name] [name] MRN: [id number] Date of Birth: [birth year] Referring Provider (PT): Meredith Pel, MD   Encounter Date: 07/30/2020   PT End of Session - 07/30/20 1000     Visit Number 5    Number of Visits 22    Date for PT Re-Evaluation 09/25/20    Authorization Type [facility] Assistance thru 10/02/20    PT Start Time 4010    PT Stop Time 0945    PT Time Calculation (min) 58 min    Activity Tolerance Patient tolerated treatment well;Patient limited by fatigue;Patient limited by pain    Behavior During Therapy [facility] for tasks assessed/performed             Past Medical History:  Diagnosis Date   Acute kidney injury ([place]) 03/02/2019   Acute respiratory failure with hypoxia ([place]) 02/22/2019   Atrial fibrillation with RVR ([place]) 02/26/2019   COVID-19 02/22/2019   Dysrhythmia    para. atrial fibrillation   Flu-like symptoms 02/22/2019   Hypertension    Knee osteoarthritis 10/24/2017   Pneumonia     Past Surgical History:  Procedure Laterality Date   NO PAST SURGERIES     TOTAL KNEE ARTHROPLASTY Right 12/03/2019   Procedure: RIGHT TOTAL KNEE ARTHROPLASTY;  Surgeon: Meredith Pel, MD;  Location: [place];  Service: Orthopedics;  Laterality: Right;   TOTAL KNEE ARTHROPLASTY Left 07/07/2020   Procedure: LEFT TOTAL KNEE ARTHROPLASTY-CEMENTED;  Surgeon: Meredith Pel, MD;  Location: [place];  Service: Orthopedics;  Laterality: Left;    There were no vitals filed for this visit.   Subjective Assessment - 07/30/20 0940     Subjective [name] [name] reports compliance with her HEP.  I recommended she complete her exercises 3-5X/Day.    Patient is accompained by: Interpreter    Pertinent History right TKA, DM2, A-Fib, HTN, OA, Covid-19    Limitations Lifting;Standing;Walking;House hold  activities    How long can you sit comfortably? Start-up stiffness    How long can you stand comfortably? 5 minutes    How long can you walk comfortably? 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 Yes    Pain Score 6     Pain Location Knee    Pain Orientation Left    Pain Descriptors / Indicators Aching;Sore;Tightness    Pain Type Chronic pain;Surgical pain    Pain Radiating Towards NA    Pain Onset 1 to 4 weeks ago    Pain Frequency Constant    Aggravating Factors  Prolonged postures and too much WB    Pain Relieving Factors Pain meds    Effect of Pain on Daily Activities Limited standing and walking endurance, needs a walker    Multiple Pain Sites No                [facility] PT Assessment - 07/30/20 0001       PROM   Left Knee Extension 0    Left Knee Flexion 76                           [facility] Adult PT Treatment/Exercise - 07/30/20 0001  Exercises   Exercises Knee/Hip      Knee/Hip Exercises: Stretches   Knee: Self-Stretch Limitations LLE with RLE overpressure 10 x 10 sec hold      Knee/Hip Exercises: Machines for Strengthening   Total Gym Leg Press 75# 2 sets of 10X full extension to as much flexion as possible with slow eccentrics      Knee/Hip Exercises: Seated   Other Seated Knee/Hip Exercises Seated tailgate knee flexion 2 minutes emphasis on pull (flexion)      Knee/Hip Exercises: Supine   Quad Sets Strengthening;Both;2 sets;10 reps;Limitations    Quad Sets Limitations 5 seconds (toes back, press knees down, tighten thigh)    Heel Slides AAROM;Left;1 set;10 reps;Limitations    Heel Slides Limitations 10 seconds with belt      Modalities   Modalities Vasopneumatic      Vasopneumatic   Number Minutes Vasopneumatic  10 minutes    Vasopnuematic Location  Knee    Vasopneumatic Pressure Medium     Vasopneumatic Temperature  34                    PT Education - 07/30/20 0958     Education Details Reviewed HEP and cues given with clinic program.    Person(s) Educated Patient    Methods Explanation;Demonstration;Tactile cues;Verbal cues    Comprehension Verbalized understanding;Tactile cues required;Need further instruction;Returned demonstration;Verbal cues required              PT Short Term Goals - 07/30/20 0959       PT SHORT TERM GOAL #1   Title Pt will be I with initial HEP for knee AROM and strength    Time 1    Period Months    Status Achieved    Target Date 08/21/20      PT SHORT TERM GOAL #2   Title Pt will be able to perform SLR on left LE without assist for improved sit to supine    Time 1    Period Months    Status On-going    Target Date 08/21/20      PT SHORT TERM GOAL #3   Title Pt will report less pain in left knee when sitting for improved comfort and rest at night    Time 1    Period Months    Status On-going    Target Date 08/21/20               PT Long Term Goals - 07/30/20 0959       PT LONG TERM GOAL #1   Title Patient will demonstrate/report pain at worst less than or equal to 2/10 to facilitate minimal limitation in daily activity secondary to pain symptoms.    Time 10    Period Weeks    Status On-going      PT LONG TERM GOAL #2   Title Patient will demonstrate independent use of home exercise program to facilitate ability to maintain/progress functional gains from skilled physical therapy services.    Time 10    Period Weeks    Status On-going      PT LONG TERM GOAL #3   Title Patient will demonstrate independent ambulation community distances > 300 ft to facilitate community integration at [facility].    Time 10    Period Weeks    Status On-going      PT LONG TERM GOAL #4   Title Pt. will demonstrate Left knee AORM 0-100 deg to facilitate ability to  perform transfers, walking, stairs at PLOF s limitation.     Baseline Extension is great, flexion 76 PROM 07/30/2020    Time 10    Period Weeks    Status Partially Met      PT LONG TERM GOAL #5   Title Pt. will demonstrate left knee MMT >/= 4/5 throughout to facilitate independent ambulation, stair navigation, transfers at PLOF.    Time 10    Period Weeks    Status On-going                   Plan - 07/30/20 1001     Clinical Impression Statement [name] [name] has full extension AROM and 76 degrees of PROM (when she is pulling with a belt while supine).  This is signficantly improved from the 52 degrees of PROM a week ago.  Knee flexion AROM and quadriceps strength remain high priorities.    Personal Factors and Comorbidities Age;Comorbidity 3+;Transportation    Comorbidities right TKA, DM2, A-Fib, HTN, OA, Covid-19    Examination-Activity Limitations Bed Mobility;Locomotion Level;Stairs;Stand;Transfers    Examination-Participation Restrictions Community Activity    Stability/Clinical Decision Making Stable/Uncomplicated    Clinical Decision Making Low    Rehab Potential Good    PT Frequency 3x / week   2x first week, 3x/wk for 2 weeks, then 2x/wk for 7 weeks   PT Duration Other (comment)   10 weeks   PT Treatment/Interventions ADLs/Self Care Home Management;Electrical Stimulation;Moist Heat;Cryotherapy;DME Instruction;Gait training;Stair training;Functional mobility training;Therapeutic activities;Therapeutic exercise;Balance training;Neuromuscular re-education;Patient/family education;Manual techniques;Scar mobilization;Passive range of motion;Vasopneumatic Device;Joint Manipulations    PT Next Visit Plan Flexion AROM, quadriceps strength    PT Home Exercise Plan Access Code: [email or web address]    Consulted and Agree with Plan of Care Patient             Patient will benefit from skilled therapeutic intervention in order to improve the following deficits and impairments:  Abnormal gait, Decreased activity tolerance, Decreased balance, Decreased  endurance, Decreased coordination, Decreased knowledge of use of DME, Decreased mobility, Decreased range of motion, Decreased skin integrity, Decreased scar mobility, Decreased strength, Increased edema, Postural dysfunction, Obesity, Pain  Visit Diagnosis: Difficulty in walking, not elsewhere classified  Muscle weakness (generalized)  Localized edema  Stiffness of left knee, not elsewhere classified  Chronic pain of left knee     Problem List Patient Active Problem List   Diagnosis Date Noted   Arthritis of left knee    S/P TKR (total knee replacement), 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

## 2020-07-30 NOTE — Patient Instructions (Signed)
Continue 100 quadriceps sets/day and 3-5X/Day tailgate knee flexion and AAROM knee flexion (R pushes L).

## 2020-07-31 ENCOUNTER — Other Ambulatory Visit: Payer: Self-pay

## 2020-07-31 ENCOUNTER — Other Ambulatory Visit: Payer: Self-pay | Admitting: Critical Care Medicine

## 2020-07-31 MED FILL — Gabapentin Cap 100 MG: ORAL | 30 days supply | Qty: 90 | Fill #0 | Status: AC

## 2020-07-31 MED FILL — Diltiazem HCl Coated Beads Cap ER 24HR 120 MG: ORAL | 30 days supply | Qty: 30 | Fill #2 | Status: AC

## 2020-07-31 MED FILL — Pantoprazole Sodium EC Tab 40 MG (Base Equiv): ORAL | 30 days supply | Qty: 30 | Fill #2 | Status: AC

## 2020-07-31 NOTE — Telephone Encounter (Signed)
Requested medication (s) are due for refill today: see orders  Requested medication (s) are on the active medication list: yes  Last refill:  03/25/20 #90 1 refill  Future visit scheduled: no  Notes to clinic:  medication discontinued on 06/26/20. Remains on med list . Do you want to refill medication or discontinue ?     Requested Prescriptions  Pending Prescriptions Disp Refills   amLODipine (NORVASC) 5 MG tablet 90 tablet 1    Sig: TAKE 1 TABLET (5 MG TOTAL) BY MOUTH DAILY. TO LOWER BLOOD PRESSURE      Cardiovascular:  Calcium Channel Blockers Passed - 07/31/2020  4:43 PM      Passed - Last BP in normal range    BP Readings from Last 1 Encounters:  07/11/20 120/72          Passed - Valid encounter within last 6 months    Recent Outpatient Visits           3 months ago Subconjunctival hemorrhage of right eye   [facility] Hoy Register, MD   4 months ago Conjunctival hemorrhage of right eye   [facility] Storm Frisk, MD   5 months ago Bilateral leg pain   [facility] Hoy Register, MD   7 months ago Need for immunization against influenza   [facility] [place], [place], MD   9 months ago Preoperative evaluation to rule out surgical contraindication   [facility] Cain Saupe, MD

## 2020-08-04 ENCOUNTER — Other Ambulatory Visit: Payer: Self-pay

## 2020-08-04 ENCOUNTER — Ambulatory Visit (INDEPENDENT_AMBULATORY_CARE_PROVIDER_SITE_OTHER): Payer: Self-pay | Admitting: Physical Therapy

## 2020-08-04 ENCOUNTER — Encounter: Payer: Self-pay | Admitting: Physical Therapy

## 2020-08-04 ENCOUNTER — Telehealth: Payer: Self-pay | Admitting: Orthopedic Surgery

## 2020-08-04 ENCOUNTER — Other Ambulatory Visit: Payer: Self-pay | Admitting: Surgical

## 2020-08-04 DIAGNOSIS — R262 Difficulty in walking, not elsewhere classified: Secondary | ICD-10-CM

## 2020-08-04 DIAGNOSIS — M25662 Stiffness of left knee, not elsewhere classified: Secondary | ICD-10-CM

## 2020-08-04 DIAGNOSIS — M6281 Muscle weakness (generalized): Secondary | ICD-10-CM

## 2020-08-04 DIAGNOSIS — R600 Localized edema: Secondary | ICD-10-CM

## 2020-08-04 MED ORDER — TRAMADOL HCL 50 MG PO TABS: 50.0000 mg | ORAL_TABLET | Freq: Four times a day (QID) | ORAL | 0 refills | Status: DC | PRN

## 2020-08-04 MED ORDER — AMLODIPINE BESYLATE 5 MG PO TABS
5.0000 mg | ORAL_TABLET | Freq: Every day | ORAL | 0 refills | Status: DC
  Filled 2020-08-04: qty 30, 30d supply, fill #0

## 2020-08-04 NOTE — Telephone Encounter (Signed)
Pt is s/p a total knee 07/07/20 please see message below and advise.

## 2020-08-04 NOTE — Therapy (Signed)
[facility] Physical Therapy [address] [place], [place], [address] Phone: [phone]   Fax:  [phone]  Physical Therapy Treatment  Patient Details  Name: [name] [name] MRN: [id number] Date of Birth: Jul 11, 1950 Referring Provider (PT): Meredith Pel, MD   Encounter Date: 08/04/2020   PT End of Session - 08/04/20 1200     Visit Number 6    Number of Visits 22    Date for PT Re-Evaluation 09/25/20    Authorization Type [facility] Assistance thru 10/02/20    PT Start Time 0845    PT Stop Time 0930    PT Time Calculation (min) 45 min    Activity Tolerance Patient tolerated treatment well;Patient limited by fatigue;Patient limited by pain    Behavior During Therapy [facility] for tasks assessed/performed             Past Medical History:  Diagnosis Date   Acute kidney injury ([place]) 03/02/2019   Acute respiratory failure with hypoxia ([place]) 02/22/2019   Atrial fibrillation with RVR ([place]) 02/26/2019   COVID-19 02/22/2019   Dysrhythmia    para. atrial fibrillation   Flu-like symptoms 02/22/2019   Hypertension    Knee osteoarthritis 10/24/2017   Pneumonia     Past Surgical History:  Procedure Laterality Date   NO PAST SURGERIES     TOTAL KNEE ARTHROPLASTY Right 12/03/2019   Procedure: RIGHT TOTAL KNEE ARTHROPLASTY;  Surgeon: Meredith Pel, MD;  Location: [place];  Service: Orthopedics;  Laterality: Right;   TOTAL KNEE ARTHROPLASTY Left 07/07/2020   Procedure: LEFT TOTAL KNEE ARTHROPLASTY-CEMENTED;  Surgeon: Meredith Pel, MD;  Location: [place];  Service: Orthopedics;  Laterality: Left;    There were no vitals filed for this visit.   Subjective Assessment - 08/04/20 0856     Subjective Pt states she has 7/10. (No interpreter present. Unsuccessful contact attempts from front desk.)    Patient is accompained by: Interpreter   Does not come to session. Used translation app and family member over phone to begin session   Pertinent  History right TKA, DM2, A-Fib, HTN, OA, Covid-19    Limitations Lifting;Standing;Walking;House hold activities    How long can you sit comfortably? Start-up stiffness    How long can you stand comfortably? 5 minutes    How long can you walk comfortably? 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 Yes    Pain Score 5     Pain Location Knee    Pain Orientation Left    Pain Descriptors / Indicators Aching;Sore;Tightness    Pain Onset 1 to 4 weeks ago                               [facility] Adult PT Treatment/Exercise - 08/04/20 0001       Exercises   Exercises Knee/Hip      Knee/Hip Exercises: Stretches   Knee: Self-Stretch Limitations LLE with RLE overpressure 10 x 10 sec hold               Knee/Hip Exercises: Seated   Other Seated Knee/Hip Exercises Seated tailgate knee flexion 2 minutes emphasis on pull (flexion)             Heel Slides AAROM;Left;1 set;10 reps;Limitations  Heel Slides Limitations 10 seconds with belt      Modalities   Modalities Vasopneumatic      Vasopneumatic   Number Minutes Vasopneumatic  10 minutes    Vasopnuematic Location  Knee    Vasopneumatic Pressure Medium    Vasopneumatic Temperature  34            Manual therapy- knee flexion joint mob grade III, flexion with clinician OP and tibial plateau glide          PT Short Term Goals - 07/30/20 0959       PT SHORT TERM GOAL #1   Title Pt will be I with initial HEP for knee AROM and strength    Time 1    Period Months    Status Achieved    Target Date 08/21/20      PT SHORT TERM GOAL #2   Title Pt will be able to perform SLR on left LE without assist for improved sit to supine    Time 1    Period Months    Status On-going    Target Date 08/21/20      PT SHORT TERM GOAL #3   Title Pt will report less pain in  left knee when sitting for improved comfort and rest at night    Time 1    Period Months    Status On-going    Target Date 08/21/20               PT Long Term Goals - 07/30/20 0959       PT LONG TERM GOAL #1   Title Patient will demonstrate/report pain at worst less than or equal to 2/10 to facilitate minimal limitation in daily activity secondary to pain symptoms.    Time 10    Period Weeks    Status On-going      PT LONG TERM GOAL #2   Title Patient will demonstrate independent use of home exercise program to facilitate ability to maintain/progress functional gains from skilled physical therapy services.    Time 10    Period Weeks    Status On-going      PT LONG TERM GOAL #3   Title Patient will demonstrate independent ambulation community distances > 300 ft to facilitate community integration at [facility].    Time 10    Period Weeks    Status On-going      PT LONG TERM GOAL #4   Title Pt. will demonstrate Left knee AORM 0-100 deg to facilitate ability to perform transfers, walking, stairs at PLOF s limitation.    Baseline Extension is great, flexion 76 PROM 07/30/2020    Time 10    Period Weeks    Status Partially Met      PT LONG TERM GOAL #5   Title Pt. will demonstrate left knee MMT >/= 4/5 throughout to facilitate independent ambulation, stair navigation, transfers at PLOF.    Time 10    Period Weeks    Status On-going                   Plan - 08/04/20 0912     Clinical Impression Statement Pt session signficantly limited by lack of [place] translation. Pt's family member/friend was used to help translate session. iPad translation service unavailable. Pt only able to work on AAROM flexion and manual therapy due to inability to assess pt's response to other treatment. Pt appears to have increased pain with flexion but could not explain  to family/friend ([place]) over phone. Plan to continue with therapy as normal at next session with proper translation of  treatment session. Pt would benefit from continued skilled therapy in order to reach goals and maximize functional L LE strength and ROM for full return to PLOF.    Personal Factors and Comorbidities Age;Comorbidity 3+;Transportation    Comorbidities right TKA, DM2, A-Fib, HTN, OA, Covid-19    Examination-Activity Limitations Bed Mobility;Locomotion Level;Stairs;Stand;Transfers    Examination-Participation Restrictions Community Activity    Stability/Clinical Decision Making Stable/Uncomplicated    Rehab Potential Good    PT Frequency 3x / week   2x first week, 3x/wk for 2 weeks, then 2x/wk for 7 weeks   PT Duration Other (comment)   10 weeks   PT Treatment/Interventions ADLs/Self Care Home Management;Electrical Stimulation;Moist Heat;Cryotherapy;DME Instruction;Gait training;Stair training;Functional mobility training;Therapeutic activities;Therapeutic exercise;Balance training;Neuromuscular re-education;Patient/family education;Manual techniques;Scar mobilization;Passive range of motion;Vasopneumatic Device;Joint Manipulations    PT Next Visit Plan Flexion AROM, quadriceps strength    PT Home Exercise Plan Access Code: [email or web address]    Consulted and Agree with Plan of Care Patient             Patient will benefit from skilled therapeutic intervention in order to improve the following deficits and impairments:  Abnormal gait, Decreased activity tolerance, Decreased balance, Decreased endurance, Decreased coordination, Decreased knowledge of use of DME, Decreased mobility, Decreased range of motion, Decreased skin integrity, Decreased scar mobility, Decreased strength, Increased edema, Postural dysfunction, Obesity, Pain  Visit Diagnosis: Difficulty in walking, not elsewhere classified  Muscle weakness (generalized)  Localized edema  Stiffness of left knee, not elsewhere classified     Problem List Patient Active Problem List   Diagnosis Date Noted   Arthritis of left knee    S/P TKR  (total knee replacement), 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   Conjunctival hemorrhage of right eye 03/25/2020   Chronic anticoagulation 03/25/2020   S/P total knee arthroplasty 12/04/2019   S/P knee surgery 12/03/2019   Type 2 diabetes mellitus with obesity ([place]) 07/22/2019   Chronic atrial fibrillation ([place]) 02/26/2019   Essential hypertension, benign 05/03/2018   Knee osteoarthritis 10/24/2017   Daleen Bo PT, DPT 08/04/20 12:05 PM   [facility] [address] [place], [place], [address] Phone: [phone]   Fax:  [phone]  Name: Faelyn Sigler MRN: [id number] Date of Birth: Aug 22, 1950

## 2020-08-04 NOTE — Telephone Encounter (Signed)
Sent in RX and dc'ed oxycodone

## 2020-08-04 NOTE — Telephone Encounter (Signed)
Pt caseworker asked to changed medication to tramadol and also change pharmacy to [place] [facility]. Phone number is [phone]

## 2020-08-05 ENCOUNTER — Ambulatory Visit (INDEPENDENT_AMBULATORY_CARE_PROVIDER_SITE_OTHER): Payer: Self-pay | Admitting: Physical Therapy

## 2020-08-05 DIAGNOSIS — M25662 Stiffness of left knee, not elsewhere classified: Secondary | ICD-10-CM

## 2020-08-05 DIAGNOSIS — M25562 Pain in left knee: Secondary | ICD-10-CM

## 2020-08-05 DIAGNOSIS — R2681 Unsteadiness on feet: Secondary | ICD-10-CM

## 2020-08-05 DIAGNOSIS — R600 Localized edema: Secondary | ICD-10-CM

## 2020-08-05 DIAGNOSIS — R262 Difficulty in walking, not elsewhere classified: Secondary | ICD-10-CM

## 2020-08-05 DIAGNOSIS — M6281 Muscle weakness (generalized): Secondary | ICD-10-CM

## 2020-08-05 DIAGNOSIS — G8929 Other chronic pain: Secondary | ICD-10-CM

## 2020-08-05 NOTE — Therapy (Signed)
[facility] Physical Therapy [address] [place], [place], [address] Phone: [phone]   Fax:  [phone]  Physical Therapy Treatment  Patient Details  Name: [name] [name] MRN: [id number] Date of Birth: [birth year] Referring Provider (PT): Meredith Pel, MD   Encounter Date: 08/05/2020   PT End of Session - 08/05/20 0900     Visit Number 7    Number of Visits 22    Date for PT Re-Evaluation 09/25/20    Authorization Type [facility] Assistance thru 10/02/20    PT Start Time 0801    PT Stop Time 0850    PT Time Calculation (min) 49 min    Activity Tolerance Patient tolerated treatment well;Patient limited by fatigue;Patient limited by pain    Behavior During Therapy [name] for tasks assessed/performed             Past Medical History:  Diagnosis Date   Acute kidney injury ([place]) 03/02/2019   Acute respiratory failure with hypoxia ([place]) 02/22/2019   Atrial fibrillation with RVR ([place]) 02/26/2019   COVID-19 02/22/2019   Dysrhythmia    para. atrial fibrillation   Flu-like symptoms 02/22/2019   Hypertension    Knee osteoarthritis 10/24/2017   Pneumonia     Past Surgical History:  Procedure Laterality Date   NO PAST SURGERIES     TOTAL KNEE ARTHROPLASTY Right 12/03/2019   Procedure: RIGHT TOTAL KNEE ARTHROPLASTY;  Surgeon: Meredith Pel, MD;  Location: [place];  Service: Orthopedics;  Laterality: Right;   TOTAL KNEE ARTHROPLASTY Left 07/07/2020   Procedure: LEFT TOTAL KNEE ARTHROPLASTY-CEMENTED;  Surgeon: Meredith Pel, MD;  Location: [place];  Service: Orthopedics;  Laterality: Left;    There were no vitals filed for this visit.   Subjective Assessment - 08/05/20 0813     Subjective Pt states 8/10 knee pain overall but also states "I am doing good today"    Patient is accompained by: Interpreter    Pertinent History right TKA, DM2, A-Fib, HTN, OA, Covid-19    Limitations Lifting;Standing;Walking;House hold activities    How long  can you sit comfortably? Start-up stiffness    How long can you stand comfortably? 5 minutes    How long can you walk comfortably? 150-200 feet (was < 100 feet)    Patient Stated Goals She wants to be able to be able find a job looking after children, stand to cook and walk in community, visiting    Pain Onset 1 to 4 weeks ago              [facility] Adult PT Treatment/Exercise - 08/05/20 0001       Knee/Hip Exercises: Aerobic   Nustep L5 X10 min      Knee/Hip Exercises: Machines for Strengthening   Total Gym Leg Press 81# 2 sets of 15X (pt states she did 50 reps)full extension to as much flexion as possible with slow eccentrics      Knee/Hip Exercises: Seated   Long Arc Quad Left;2 sets;15 reps    Long Arc Quad Weight 2 lbs.    Long [employer] Limitations 2#, pt states she did extra reps    Other Seated Knee/Hip Exercises Seated tailgate knee flexion 3 minutes emphasis on pull (flexion)    Sit to Sand 10 reps;without UE support   slightly raised mat     Vasopneumatic   Number Minutes Vasopneumatic  10 minutes    Vasopnuematic Location  Knee    Vasopneumatic Pressure Medium    Vasopneumatic  Temperature  34      Manual Therapy   Manual therapy comments Lt knee PROM and flexion mobs                      PT Short Term Goals - 07/30/20 0959       PT SHORT TERM GOAL #1   Title Pt will be I with initial HEP for knee AROM and strength    Time 1    Period Months    Status Achieved    Target Date 08/21/20      PT SHORT TERM GOAL #2   Title Pt will be able to perform SLR on left LE without assist for improved sit to supine    Time 1    Period Months    Status On-going    Target Date 08/21/20      PT SHORT TERM GOAL #3   Title Pt will report less pain in left knee when sitting for improved comfort and rest at night    Time 1    Period Months    Status On-going    Target Date 08/21/20               PT Long Term Goals - 07/30/20 0959       PT LONG TERM  GOAL #1   Title Patient will demonstrate/report pain at worst less than or equal to 2/10 to facilitate minimal limitation in daily activity secondary to pain symptoms.    Time 10    Period Weeks    Status On-going      PT LONG TERM GOAL #2   Title Patient will demonstrate independent use of home exercise program to facilitate ability to maintain/progress functional gains from skilled physical therapy services.    Time 10    Period Weeks    Status On-going      PT LONG TERM GOAL #3   Title Patient will demonstrate independent ambulation community distances > 300 ft to facilitate community integration at [facility].    Time 10    Period Weeks    Status On-going      PT LONG TERM GOAL #4   Title Pt. will demonstrate Left knee AORM 0-100 deg to facilitate ability to perform transfers, walking, stairs at PLOF s limitation.    Baseline Extension is great, flexion 76 PROM 07/30/2020    Time 10    Period Weeks    Status Partially Met      PT LONG TERM GOAL #5   Title Pt. will demonstrate left knee MMT >/= 4/5 throughout to facilitate independent ambulation, stair navigation, transfers at PLOF.    Time 10    Period Weeks    Status On-going                   Plan - 08/05/20 0901     Clinical Impression Statement She had in person interpreter today so able to do more in todays session with improved communication. Progressed her strengthening program some but she would perform more reps than prescribed so monitor for sorness next visit as she will be back again tommorow which will be 3 days in a row.    Personal Factors and Comorbidities Age;Comorbidity 3+;Transportation    Comorbidities right TKA, DM2, A-Fib, HTN, OA, Covid-19    Examination-Activity Limitations Bed Mobility;Locomotion Level;Stairs;Stand;Transfers    Examination-Participation Restrictions Community Activity    Stability/Clinical Decision Making Stable/Uncomplicated    Rehab Potential Good  PT Frequency 3x / week    2x first week, 3x/wk for 2 weeks, then 2x/wk for 7 weeks   PT Duration Other (comment)   10 weeks   PT Treatment/Interventions ADLs/Self Care Home Management;Electrical Stimulation;Moist Heat;Cryotherapy;DME Instruction;Gait training;Stair training;Functional mobility training;Therapeutic activities;Therapeutic exercise;Balance training;Neuromuscular re-education;Patient/family education;Manual techniques;Scar mobilization;Passive range of motion;Vasopneumatic Device;Joint Manipulations    PT Next Visit Plan Flexion AROM, quadriceps strength    PT Home Exercise Plan Access Code: [email or web address]    Consulted and Agree with Plan of Care Patient             Patient will benefit from skilled therapeutic intervention in order to improve the following deficits and impairments:  Abnormal gait, Decreased activity tolerance, Decreased balance, Decreased endurance, Decreased coordination, Decreased knowledge of use of DME, Decreased mobility, Decreased range of motion, Decreased skin integrity, Decreased scar mobility, Decreased strength, Increased edema, Postural dysfunction, Obesity, Pain  Visit Diagnosis: Difficulty in walking, not elsewhere classified  Muscle weakness (generalized)  Localized edema  Stiffness of left knee, not elsewhere classified  Chronic pain of left knee  Unsteadiness on feet     Problem List Patient Active Problem List   Diagnosis Date Noted   Arthritis of left knee    S/P TKR (total knee replacement), 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

## 2020-08-06 ENCOUNTER — Other Ambulatory Visit: Payer: Self-pay

## 2020-08-06 ENCOUNTER — Ambulatory Visit (INDEPENDENT_AMBULATORY_CARE_PROVIDER_SITE_OTHER): Payer: Self-pay | Admitting: Rehabilitative and Restorative Service Providers"

## 2020-08-06 ENCOUNTER — Encounter: Payer: Self-pay | Admitting: Rehabilitative and Restorative Service Providers"

## 2020-08-06 DIAGNOSIS — G8929 Other chronic pain: Secondary | ICD-10-CM

## 2020-08-06 DIAGNOSIS — M25662 Stiffness of left knee, not elsewhere classified: Secondary | ICD-10-CM

## 2020-08-06 DIAGNOSIS — R2681 Unsteadiness on feet: Secondary | ICD-10-CM

## 2020-08-06 DIAGNOSIS — R262 Difficulty in walking, not elsewhere classified: Secondary | ICD-10-CM

## 2020-08-06 DIAGNOSIS — R600 Localized edema: Secondary | ICD-10-CM

## 2020-08-06 DIAGNOSIS — M6281 Muscle weakness (generalized): Secondary | ICD-10-CM

## 2020-08-06 DIAGNOSIS — M25562 Pain in left knee: Secondary | ICD-10-CM

## 2020-08-06 NOTE — Patient Instructions (Signed)
Access Code: [email or web address] URL: [email or web address] Date: 08/06/2020 Prepared by: Pauletta Browns  Exercises Seated Knee Flexion AAROM - 3-5 x daily - 7 x weekly - 1 sets - 10 reps - 10 sec hold Supine Quadricep Sets - 2-3 x daily - 7 x weekly - 2-3 sets - 10 reps - 5 second hold

## 2020-08-06 NOTE — Therapy (Signed)
[facility] Physical Therapy [address] [place], [place], [address] Phone: [phone]   Fax:  [phone]  Physical Therapy Treatment  Patient Details  Name: [name] [name] MRN: [id number] Date of Birth: December 11, 1950 Referring Provider (PT): Meredith Pel, MD   Encounter Date: 08/06/2020   PT End of Session - 08/06/20 0852     Visit Number 8    Number of Visits 22    Date for PT Re-Evaluation 09/25/20    Authorization Type [facility] Assistance thru 10/02/20    PT Start Time 0801    PT Stop Time 3893    PT Time Calculation (min) 54 min    Activity Tolerance Patient tolerated treatment well;Patient limited by fatigue    Behavior During Therapy [facility] for tasks assessed/performed             Past Medical History:  Diagnosis Date   Acute kidney injury ([place]) 03/02/2019   Acute respiratory failure with hypoxia ([place]) 02/22/2019   Atrial fibrillation with RVR ([place]) 02/26/2019   COVID-19 02/22/2019   Dysrhythmia    para. atrial fibrillation   Flu-like symptoms 02/22/2019   Hypertension    Knee osteoarthritis 10/24/2017   Pneumonia     Past Surgical History:  Procedure Laterality Date   NO PAST SURGERIES     TOTAL KNEE ARTHROPLASTY Right 12/03/2019   Procedure: RIGHT TOTAL KNEE ARTHROPLASTY;  Surgeon: Meredith Pel, MD;  Location: [place];  Service: Orthopedics;  Laterality: Right;   TOTAL KNEE ARTHROPLASTY Left 07/07/2020   Procedure: LEFT TOTAL KNEE ARTHROPLASTY-CEMENTED;  Surgeon: Meredith Pel, MD;  Location: [place];  Service: Orthopedics;  Laterality: Left;    There were no vitals filed for this visit.   Subjective Assessment - 08/06/20 0804     Subjective [name] [name] is using Tramdol at night and PRN during the day.  She reports good HEP compliance.    Patient is accompained by: Interpreter    Pertinent History right TKA, DM2, A-Fib, HTN, OA, Covid-19    Limitations Lifting;Standing;Walking;House hold activities    How long  can you sit comfortably? Start-up stiffness    How long can you stand comfortably? 5 minutes    How long can you walk comfortably? 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 Yes    Pain Score 6     Pain Location Knee    Pain Orientation Left    Pain Descriptors / Indicators Aching;Tightness;Sore    Pain Type Chronic pain;Surgical pain    Pain Radiating Towards NA    Pain Onset More than a month ago    Pain Frequency Constant    Aggravating Factors  Prolonged postures and some trouble sleeping    Pain Relieving Factors Tramadol    Effect of Pain on Daily Activities Using a walker, although she looks ready to transition to a cane.  Poor endurance and slow gait speed.    Multiple Pain Sites No                [facility] PT Assessment - 08/06/20 0001       PROM   Left Knee Extension 0    Left Knee Flexion 90  [place] Adult PT Treatment/Exercise - 08/06/20 0001       Exercises   Exercises Knee/Hip      Knee/Hip Exercises: Stretches   Knee: Self-Stretch Limitations LLE with RLE overpressure 10 x 10 sec hold      Knee/Hip Exercises: Machines for Strengthening   Total Gym Leg Press 81# 15X full extension to as much flexion as possible with slow eccentrics      Knee/Hip Exercises: Seated   Other Seated Knee/Hip Exercises Seated tailgate knee flexion 2 minutes emphasis on pull (flexion)      Knee/Hip Exercises: Supine   Quad Sets Strengthening;Both;2 sets;10 reps;Limitations    Quad Sets Limitations 5 seconds (toes back, press knee down, tighten thigh)    Heel Slides AAROM;Left;1 set;10 reps;Limitations    Heel Slides Limitations 10 seconds with belt      Modalities   Modalities Vasopneumatic      Vasopneumatic   Number Minutes Vasopneumatic  10 minutes    Vasopnuematic Location  Knee     Vasopneumatic Pressure Medium    Vasopneumatic Temperature  34                    PT Education - 08/06/20 0850     Education Details Reviewed HEP with help from translator to translate into [place] with written HEP (tailgate knee flexion, knee flexion AAROM-R pushes L and 100 quadriceps sets/day).    Person(s) Educated Patient    Methods Explanation;Demonstration;Tactile cues;Verbal cues;Handout    Comprehension Verbal cues required;Returned demonstration;Need further instruction;Verbalized understanding;Tactile cues required              PT Short Term Goals - 08/06/20 0851       PT SHORT TERM GOAL #1   Title Pt will be I with initial HEP for knee AROM and strength    Time 1    Period Months    Status Achieved    Target Date 08/21/20      PT SHORT TERM GOAL #2   Title Pt will be able to perform SLR on left LE without assist for improved sit to supine    Time 1    Period Months    Status Achieved    Target Date 08/21/20      PT SHORT TERM GOAL #3   Title Pt will report less pain in left knee when sitting for improved comfort and rest at night    Time 1    Period Months    Status On-going    Target Date 08/21/20               PT Long Term Goals - 08/06/20 0851       PT LONG TERM GOAL #1   Title Patient will demonstrate/report pain at worst less than or equal to 2/10 to facilitate minimal limitation in daily activity secondary to pain symptoms.    Time 10    Period Weeks    Status On-going      PT LONG TERM GOAL #2   Title Patient will demonstrate independent use of home exercise program to facilitate ability to maintain/progress functional gains from skilled physical therapy services.    Time 10    Period Weeks    Status On-going      PT LONG TERM GOAL #3   Title Patient will demonstrate independent ambulation community distances > 300 ft to facilitate community integration at [facility].    Baseline Very slow without the walker on 08/06/2020.  Time  10    Period Weeks    Status On-going      PT LONG TERM GOAL #4   Title Pt. will demonstrate Left knee AORM 0-100 deg to facilitate ability to perform transfers, walking, stairs at PLOF s limitation.    Baseline Extension is great, flexion 90 PROM 08/06/2020    Time 10    Period Weeks    Status Partially Met      PT LONG TERM GOAL #5   Title Pt. will demonstrate left knee MMT >/= 4/5 throughout to facilitate independent ambulation, stair navigation, transfers at PLOF.    Time 10    Period Weeks    Status On-going                   Plan - 08/06/20 0858     Clinical Impression Statement [name] [name] is making great objective AROM progress.  She has full extension AROM and flexion is at 90 degrees (was 76 last week).  Quadriceps strength, gait quality, speed and balance will benefit from continued work.    Personal Factors and Comorbidities Age;Comorbidity 3+;Transportation    Comorbidities right TKA, DM2, A-Fib, HTN, OA, Covid-19    Examination-Activity Limitations Bed Mobility;Locomotion Level;Stairs;Stand;Transfers    Examination-Participation Restrictions Community Activity    Stability/Clinical Decision Making Stable/Uncomplicated    Rehab Potential Good    PT Frequency 3x / week   2x first week, 3x/wk for 2 weeks, then 2x/wk for 7 weeks   PT Duration Other (comment)   10 weeks   PT Treatment/Interventions ADLs/Self Care Home Management;Electrical Stimulation;Moist Heat;Cryotherapy;DME Instruction;Gait training;Stair training;Functional mobility training;Therapeutic activities;Therapeutic exercise;Balance training;Neuromuscular re-education;Patient/family education;Manual techniques;Scar mobilization;Passive range of motion;Vasopneumatic Device;Joint Manipulations    PT Next Visit Plan Flexion AROM, quadriceps strength, transition to a cane.    PT Home Exercise Plan Access Code: [email or web address]    Consulted and Agree with Plan of Care Patient             Patient will benefit  from skilled therapeutic intervention in order to improve the following deficits and impairments:  Abnormal gait, Decreased activity tolerance, Decreased balance, Decreased endurance, Decreased coordination, Decreased knowledge of use of DME, Decreased mobility, Decreased range of motion, Decreased skin integrity, Decreased scar mobility, Decreased strength, Increased edema, Postural dysfunction, Obesity, Pain  Visit Diagnosis: Difficulty in walking, not elsewhere classified  Muscle weakness (generalized)  Localized edema  Stiffness of left knee, not elsewhere classified  Chronic pain of left knee  Unsteadiness on feet     Problem List Patient Active Problem List   Diagnosis Date Noted   Arthritis of left knee    S/P TKR (total knee replacement), 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 [place], [place], [address] Phone: [phone]   Fax:  [phone]  Name: [name] [name] MRN: [id number] Date of Birth: 05-14-1950

## 2020-08-11 ENCOUNTER — Other Ambulatory Visit: Payer: Self-pay

## 2020-08-11 ENCOUNTER — Encounter: Payer: Self-pay | Admitting: Physical Therapy

## 2020-08-11 ENCOUNTER — Ambulatory Visit (INDEPENDENT_AMBULATORY_CARE_PROVIDER_SITE_OTHER): Payer: Self-pay | Admitting: Physical Therapy

## 2020-08-11 DIAGNOSIS — M25662 Stiffness of left knee, not elsewhere classified: Secondary | ICD-10-CM

## 2020-08-11 DIAGNOSIS — R600 Localized edema: Secondary | ICD-10-CM

## 2020-08-11 DIAGNOSIS — R262 Difficulty in walking, not elsewhere classified: Secondary | ICD-10-CM

## 2020-08-11 DIAGNOSIS — M6281 Muscle weakness (generalized): Secondary | ICD-10-CM

## 2020-08-11 NOTE — Therapy (Signed)
[facility] [address] [place], [place], [address] Phone: [phone]   Fax:  [phone]  Physical Therapy Treatment  Patient Details  Name: [name] [name] MRN: [id number] Date of Birth: 11/28/1950 Referring Provider (PT): Meredith Pel, MD   Encounter Date: 08/11/2020   PT End of Session - 08/11/20 0905     Visit Number 9    Number of Visits 22    Date for PT Re-Evaluation 09/25/20    Authorization Type [facility] [employer] Assistance thru 10/02/20    PT Start Time 0858    PT Stop Time 0930    PT Time Calculation (min) 32 min    Activity Tolerance Patient tolerated treatment well;Patient limited by fatigue    Behavior During Therapy [facility] for tasks assessed/performed             Past Medical History:  Diagnosis Date   Acute kidney injury ([place]) 03/02/2019   Acute respiratory failure with hypoxia ([place]) 02/22/2019   Atrial fibrillation with RVR ([place]) 02/26/2019   COVID-19 02/22/2019   Dysrhythmia    para. atrial fibrillation   Flu-like symptoms 02/22/2019   Hypertension    Knee osteoarthritis 10/24/2017   Pneumonia     Past Surgical History:  Procedure Laterality Date   NO PAST SURGERIES     TOTAL KNEE ARTHROPLASTY Right 12/03/2019   Procedure: RIGHT TOTAL KNEE ARTHROPLASTY;  Surgeon: Meredith Pel, MD;  Location: [place];  Service: Orthopedics;  Laterality: Right;   TOTAL KNEE ARTHROPLASTY Left 07/07/2020   Procedure: LEFT TOTAL KNEE ARTHROPLASTY-CEMENTED;  Surgeon: Meredith Pel, MD;  Location: [place];  Service: Orthopedics;  Laterality: Left;    There were no vitals filed for this visit.   Subjective Assessment - 08/11/20 0902     Subjective Pt states she is doing much better.    Patient is accompained by: Interpreter    Pertinent History right TKA, DM2, A-Fib, HTN, OA, Covid-19    Limitations Lifting;Standing;Walking;House hold activities    How long can you sit comfortably? Start-up stiffness    How long  can you stand comfortably? 5 minutes    How long can you walk comfortably? 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 Yes    Pain Score 5     Pain Location Knee    Pain Orientation Left    Pain Descriptors / Indicators Aching;Sore    Pain Onset More than a month ago                               [facility] Adult PT Treatment/Exercise - 08/11/20 0001       Ambulation/Gait   Ambulation/Gait Yes    Ambulation/Gait Assistance 5: Supervision    Ambulation/Gait Assistance Details CGA-minA without AD, sup with SPC    Ambulation Distance (Feet) 80 Feet    Assistive device Straight cane    Gait Pattern Step-through pattern    Gait Comments heavy VC and TC needed for sequencing, heel strike/toe off, and posture      Exercises   Exercises Knee/Hip      Knee/Hip Exercises: Stretches   Knee: Self-Stretch Limitations LLE with RLE overpressure 10 x 10 sec hold      Knee/Hip Exercises: Machines for Strengthening  Total Gym Leg Press --      Knee/Hip Exercises: Seated   Other Seated Knee/Hip Exercises Seated tailgate knee flexion 2 minutes emphasis on pull (flexion)      Knee/Hip Exercises: Supine   Quad Sets Strengthening;Both;2 sets;10 reps;Limitations    Heel Slides AAROM;Left;1 set;10 reps;Limitations    Heel Slides Limitations 10 seconds with belt    Straight Leg Raises Limitations 2x10 slight extensor lag with fatigue                                    Manual Therapy   Manual therapy comments Lt knee PROM and flexion mobs    Joint Mobilization grade II-III knee flexion mob, AP tib plateau                    PT Education - 08/11/20 0904     Education Details anatomy, exercise progression, DOMS expectations, ROM requirements, HEP, transfer mechancis/safety, AD usage    Person(s) Educated Patient     Methods Explanation;Demonstration;Tactile cues;Verbal cues    Comprehension Verbalized understanding;Returned demonstration;Verbal cues required;Tactile cues required              PT Short Term Goals - 08/06/20 0851       PT SHORT TERM GOAL #1   Title Pt will be I with initial HEP for knee AROM and strength    Time 1    Period Months    Status Achieved    Target Date 08/21/20      PT SHORT TERM GOAL #2   Title Pt will be able to perform SLR on left LE without assist for improved sit to supine    Time 1    Period Months    Status Achieved    Target Date 08/21/20      PT SHORT TERM GOAL #3   Title Pt will report less pain in left knee when sitting for improved comfort and rest at night    Time 1    Period Months    Status On-going    Target Date 08/21/20               PT Long Term Goals - 08/06/20 0851       PT LONG TERM GOAL #1   Title Patient will demonstrate/report pain at worst less than or equal to 2/10 to facilitate minimal limitation in daily activity secondary to pain symptoms.    Time 10    Period Weeks    Status On-going      PT LONG TERM GOAL #2   Title Patient will demonstrate independent use of home exercise program to facilitate ability to maintain/progress functional gains from skilled physical therapy services.    Time 10    Period Weeks    Status On-going      PT LONG TERM GOAL #3   Title Patient will demonstrate independent ambulation community distances > 300 ft to facilitate community integration at [facility].    Baseline Very slow without the walker on 08/06/2020.    Time 10    Period Weeks    Status On-going      PT LONG TERM GOAL #4   Title Pt. will demonstrate Left knee AORM 0-100 deg to facilitate ability to perform transfers, walking, stairs at PLOF s limitation.    Baseline Extension is great, flexion 90 PROM 08/06/2020    Time 10  Period Weeks    Status Partially Met      PT LONG TERM GOAL #5   Title Pt. will demonstrate left  knee MMT >/= 4/5 throughout to facilitate independent ambulation, stair navigation, transfers at PLOF.    Time 10    Period Weeks    Status On-going                   Plan - 08/11/20 0934     Clinical Impression Statement Pt is progressing well with functional mobility. Pt was able to perform gait with SPC after heavy VC and demonstration for proper sequencing with AD. Pt able to progress to [facility] indoors with RW usage while outside or on uneven terrain. Pt with quadriceps weakness and fatigue with sustained repetitions of SLR. Pt flexion ROM progressing well and pt's reported pain is also decreasing with increased ROM. Pt still with moderate swelling into knee and around L ankle. Pt would benefit from continued skilled therapy in order to reach goals and maximize functional L LE strength and ROM for full return to PLOF.    Personal Factors and Comorbidities Age;Comorbidity 3+;Transportation    Comorbidities right TKA, DM2, A-Fib, HTN, OA, Covid-19    Examination-Activity Limitations Bed Mobility;Locomotion Level;Stairs;Stand;Transfers    Examination-Participation Restrictions Community Activity    Stability/Clinical Decision Making Stable/Uncomplicated    Rehab Potential Good    PT Frequency 3x / week   2x first week, 3x/wk for 2 weeks, then 2x/wk for 7 weeks   PT Duration Other (comment)   10 weeks   PT Treatment/Interventions ADLs/Self Care Home Management;Electrical Stimulation;Moist Heat;Cryotherapy;DME Instruction;Gait training;Stair training;Functional mobility training;Therapeutic activities;Therapeutic exercise;Balance training;Neuromuscular re-education;Patient/family education;Manual techniques;Scar mobilization;Passive range of motion;Vasopneumatic Device;Joint Manipulations    PT Next Visit Plan Flexion AROM, quadriceps strength, transition to a cane (review)    PT Home Exercise Plan Access Code: [email or web address]    Consulted and Agree with Plan of Care Patient              Patient will benefit from skilled therapeutic intervention in order to improve the following deficits and impairments:  Abnormal gait, Decreased activity tolerance, Decreased balance, Decreased endurance, Decreased coordination, Decreased knowledge of use of DME, Decreased mobility, Decreased range of motion, Decreased skin integrity, Decreased scar mobility, Decreased strength, Increased edema, Postural dysfunction, Obesity, Pain  Visit Diagnosis: Difficulty in walking, not elsewhere classified  Muscle weakness (generalized)  Stiffness of left knee, not elsewhere classified  Localized edema     Problem List Patient Active Problem List   Diagnosis Date Noted   Arthritis of left knee    S/P TKR (total knee replacement), 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 [place], [place], [address] Phone: [phone]   Fax:  [phone]  Name: [name] [name] MRN: [id number] Date of Birth: 03-19-1950

## 2020-08-13 ENCOUNTER — Encounter: Payer: Self-pay | Admitting: Rehabilitative and Restorative Service Providers"

## 2020-08-13 ENCOUNTER — Ambulatory Visit (INDEPENDENT_AMBULATORY_CARE_PROVIDER_SITE_OTHER): Payer: Self-pay | Admitting: Rehabilitative and Restorative Service Providers"

## 2020-08-13 ENCOUNTER — Other Ambulatory Visit: Payer: Self-pay

## 2020-08-13 DIAGNOSIS — R600 Localized edema: Secondary | ICD-10-CM

## 2020-08-13 DIAGNOSIS — M6281 Muscle weakness (generalized): Secondary | ICD-10-CM

## 2020-08-13 DIAGNOSIS — M25662 Stiffness of left knee, not elsewhere classified: Secondary | ICD-10-CM

## 2020-08-13 DIAGNOSIS — M25562 Pain in left knee: Secondary | ICD-10-CM

## 2020-08-13 DIAGNOSIS — R2681 Unsteadiness on feet: Secondary | ICD-10-CM

## 2020-08-13 DIAGNOSIS — R262 Difficulty in walking, not elsewhere classified: Secondary | ICD-10-CM

## 2020-08-13 DIAGNOSIS — G8929 Other chronic pain: Secondary | ICD-10-CM

## 2020-08-13 NOTE — Therapy (Signed)
[facility] [address] [place], [place], [address] Phone: [phone]   Fax:  [phone]  Physical Therapy Treatment/Reassessment  Patient Details  Name: [name] [name] MRN: [id number] Date of Birth: [birth year] Referring Provider (PT): Meredith Pel, MD   Encounter Date: 08/13/2020   PT End of Session - 08/13/20 1015     Visit Number 10    Number of Visits 22    Date for PT Re-Evaluation 09/25/20    Authorization Type [facility] Assistance thru 10/02/20    PT Start Time 0931    PT Stop Time 1021    PT Time Calculation (min) 50 min    Activity Tolerance Patient tolerated treatment well;Patient limited by fatigue    Behavior During Therapy [facility] for tasks assessed/performed             Past Medical History:  Diagnosis Date   Acute kidney injury ([place]) 03/02/2019   Acute respiratory failure with hypoxia ([place]) 02/22/2019   Atrial fibrillation with RVR ([place]) 02/26/2019   COVID-19 02/22/2019   Dysrhythmia    para. atrial fibrillation   Flu-like symptoms 02/22/2019   Hypertension    Knee osteoarthritis 10/24/2017   Pneumonia     Past Surgical History:  Procedure Laterality Date   NO PAST SURGERIES     TOTAL KNEE ARTHROPLASTY Right 12/03/2019   Procedure: RIGHT TOTAL KNEE ARTHROPLASTY;  Surgeon: Meredith Pel, MD;  Location: [place];  Service: Orthopedics;  Laterality: Right;   TOTAL KNEE ARTHROPLASTY Left 07/07/2020   Procedure: LEFT TOTAL KNEE ARTHROPLASTY-CEMENTED;  Surgeon: Meredith Pel, MD;  Location: [place];  Service: Orthopedics;  Laterality: Left;    There were no vitals filed for this visit.   Subjective Assessment - 08/13/20 0935     Subjective [name] [name] is seeing Dr. Marlou Sa 08/26/2020.    Patient is accompained by: Interpreter    Pertinent History right TKA, DM2, A-Fib, HTN, OA, Covid-19    Limitations Lifting;Standing;Walking;House hold activities    How long can you sit comfortably? 30 minutes     How long can you stand comfortably? 10 minutes (was 5 minutes)    How long can you walk comfortably? 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 Yes    Pain Score 5     Pain Location Knee    Pain Orientation Left    Pain Descriptors / Indicators Tightness;Sore;Aching    Pain Type Chronic pain;Surgical pain    Pain Radiating Towards NA    Pain Onset More than a month ago    Pain Frequency Constant    Aggravating Factors  Prolonged postures (particularly WB) and some trouble sleeping    Pain Relieving Factors Tramadol 3X/day    Effect of Pain on Daily Activities Uses a walker most of the time.  Very slow and inefficient without the walker.  Poor endurance and slow gait.    Multiple Pain Sites No                [facility] PT Assessment - 08/13/20 0001       Observation/Other Assessments   Focus on Therapeutic Outcomes (FOTO)  46 (Goal 65)      PROM   Left Knee Extension 0    Left Knee Flexion 95  [facility] Adult PT Treatment/Exercise - 08/13/20 0001       Exercises   Exercises Knee/Hip      Knee/Hip Exercises: Stretches   Knee: Self-Stretch Limitations LLE with RLE overpressure 10 x 10 sec hold      Knee/Hip Exercises: Machines for Strengthening   Total Gym Leg Press 87# 15X full extension to as much flexion as possible with slow eccentrics      Knee/Hip Exercises: Seated   Other Seated Knee/Hip Exercises Seated tailgate knee flexion 2 minutes emphasis on pull (flexion)      Knee/Hip Exercises: Supine   Quad Sets Strengthening;Both;2 sets;10 reps;Limitations    Quad Sets Limitations 5 seconds (toes back, press knees down, tighen thighs)    Heel Slides AAROM;Left;1 set;10 reps;Limitations    Heel Slides Limitations 10 seconds with belt       Modalities   Modalities Vasopneumatic      Vasopneumatic   Number Minutes Vasopneumatic  10 minutes    Vasopnuematic Location  Knee    Vasopneumatic Pressure Medium    Vasopneumatic Temperature  34                    PT Education - 08/13/20 1010     Education Details Reviewed exam findings and importance of multiple times/day HEP compliance.    Person(s) Educated Patient    Methods Explanation;Demonstration;Tactile cues;Verbal cues    Comprehension Verbal cues required;Need further instruction;Returned demonstration;Verbalized understanding              PT Short Term Goals - 08/13/20 1011       PT SHORT TERM GOAL #1   Title Pt will be I with initial HEP for knee AROM and strength    Time 1    Period Months    Status Achieved    Target Date 08/21/20      PT SHORT TERM GOAL #2   Title Pt will be able to perform SLR on left LE without assist for improved sit to supine    Time 1    Period Months    Status Achieved    Target Date 08/21/20      PT SHORT TERM GOAL #3   Title Pt will report less pain in left knee when sitting for improved comfort and rest at night    Time 1    Period Months    Status Achieved    Target Date 08/21/20               PT Long Term Goals - 08/13/20 1011       PT LONG TERM GOAL #1   Title Patient will demonstrate/report pain at worst less than or equal to 2/10 to facilitate minimal limitation in daily activity secondary to pain symptoms.    Baseline Still using Tramedol 3X/day, encouraged before bed with switch to tylenol during the day.    Time 10    Period Weeks    Status On-going      PT LONG TERM GOAL #2   Title Patient will demonstrate independent use of home exercise program to facilitate ability to maintain/progress functional gains from skilled physical therapy services.    Time 10    Period Weeks    Status On-going      PT LONG TERM GOAL #3   Title Patient will demonstrate independent ambulation community  distances > 300 ft to facilitate community integration at PLOF.    Baseline Very slow without the walker on 08/13/2020.      Time 10    Period Weeks    Status On-going      PT LONG TERM GOAL #4   Title Pt. will demonstrate Left knee AORM 0-100 deg to facilitate ability to perform transfers, walking, stairs at PLOF s limitation.    Baseline Extension is great, flexion 95 AAROM 08/13/2020    Time 10    Period Weeks    Status Partially Met      PT LONG TERM GOAL #5   Title Pt. will demonstrate left knee MMT >/= 4/5 throughout to facilitate independent ambulation, stair navigation, transfers at PLOF.    Baseline Improving but quadriceps weakness is still present    Time 10    Period Weeks    Status On-going                   Plan - 08/13/20 1732     Clinical Impression Statement Oliana continues to make progress with her AROM.  Extension is perfect and flexion AROM 95 degrees.  She is walking without an AD at home and with a walker in the community.   Flexion AROM, quadriceps strength and gait quality will benefit from the recommended course of PT through late August to meet all LTGs.    Personal Factors and Comorbidities Age;Comorbidity 3+;Transportation    Comorbidities right TKA, DM2, A-Fib, HTN, OA, Covid-19    Examination-Activity Limitations Bed Mobility;Locomotion Level;Stairs;Stand;Transfers    Examination-Participation Restrictions Community Activity    Stability/Clinical Decision Making Stable/Uncomplicated    Rehab Potential Good    PT Frequency 3x / week   2x first week, 3x/wk for 2 weeks, then 2x/wk for 7 weeks   PT Duration Other (comment)   10 weeks   PT Treatment/Interventions ADLs/Self Care Home Management;Electrical Stimulation;Moist Heat;Cryotherapy;DME Instruction;Gait training;Stair training;Functional mobility training;Therapeutic activities;Therapeutic exercise;Balance training;Neuromuscular re-education;Patient/family education;Manual techniques;Scar  mobilization;Passive range of motion;Vasopneumatic Device;Joint Manipulations    PT Next Visit Plan Flexion AROM, quadriceps strength, transition to a cane (review)    PT Home Exercise Plan Access Code: [email or web address]    Consulted and Agree with Plan of Care Patient             Patient will benefit from skilled therapeutic intervention in order to improve the following deficits and impairments:  Abnormal gait, Decreased activity tolerance, Decreased balance, Decreased endurance, Decreased coordination, Decreased knowledge of use of DME, Decreased mobility, Decreased range of motion, Decreased skin integrity, Decreased scar mobility, Decreased strength, Increased edema, Postural dysfunction, Obesity, Pain  Visit Diagnosis: Difficulty in walking, not elsewhere classified  Muscle weakness (generalized)  Stiffness of left knee, not elsewhere classified  Localized edema  Chronic pain of left knee  Unsteadiness on feet     Problem List Patient Active Problem List   Diagnosis Date Noted   Arthritis of left knee    S/P TKR (total knee replacement), 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    

## 2020-08-13 NOTE — Patient Instructions (Signed)
Continue tailgate knee flexion, AAROM (R pushes L) knee flexion and 100 quadriceps sets/day.

## 2020-08-17 ENCOUNTER — Other Ambulatory Visit: Payer: Self-pay

## 2020-08-18 ENCOUNTER — Encounter: Payer: Self-pay | Admitting: Physical Therapy

## 2020-08-20 ENCOUNTER — Ambulatory Visit (INDEPENDENT_AMBULATORY_CARE_PROVIDER_SITE_OTHER): Payer: Self-pay | Admitting: Rehabilitative and Restorative Service Providers"

## 2020-08-20 ENCOUNTER — Other Ambulatory Visit: Payer: Self-pay

## 2020-08-20 ENCOUNTER — Encounter: Payer: Self-pay | Admitting: Rehabilitative and Restorative Service Providers"

## 2020-08-20 DIAGNOSIS — M25562 Pain in left knee: Secondary | ICD-10-CM

## 2020-08-20 DIAGNOSIS — M6281 Muscle weakness (generalized): Secondary | ICD-10-CM

## 2020-08-20 DIAGNOSIS — G8929 Other chronic pain: Secondary | ICD-10-CM

## 2020-08-20 DIAGNOSIS — M25662 Stiffness of left knee, not elsewhere classified: Secondary | ICD-10-CM

## 2020-08-20 DIAGNOSIS — R600 Localized edema: Secondary | ICD-10-CM

## 2020-08-20 DIAGNOSIS — R262 Difficulty in walking, not elsewhere classified: Secondary | ICD-10-CM

## 2020-08-20 NOTE — Patient Instructions (Signed)
Continued focus on knee flexion AROM and quadriceps strengthening at home.

## 2020-08-20 NOTE — Therapy (Signed)
[facility] Physical Therapy [address] [place], [place], [address] Phone: [phone]   Fax:  [phone]  Physical Therapy Treatment  Patient Details  Name: [name] [name] MRN: [id number] Date of Birth: 1950-05-16 Referring Provider (PT): Meredith Pel, MD   Encounter Date: 08/20/2020   PT End of Session - 08/20/20 1123     Visit Number 11    Number of Visits 22    Date for PT Re-Evaluation 09/25/20    Authorization Type [facility] [employer] thru 10/02/20    PT Start Time 0930    PT Stop Time 1025    PT Time Calculation (min) 55 min    Activity Tolerance Patient tolerated treatment well;Patient limited by fatigue    Behavior During Therapy [facility] for tasks assessed/performed             Past Medical History:  Diagnosis Date   Acute kidney injury ([place]) 03/02/2019   Acute respiratory failure with hypoxia ([place]) 02/22/2019   Atrial fibrillation with RVR ([place]) 02/26/2019   COVID-19 02/22/2019   Dysrhythmia    para. atrial fibrillation   Flu-like symptoms 02/22/2019   Hypertension    Knee osteoarthritis 10/24/2017   Pneumonia     Past Surgical History:  Procedure Laterality Date   NO PAST SURGERIES     TOTAL KNEE ARTHROPLASTY Right 12/03/2019   Procedure: RIGHT TOTAL KNEE ARTHROPLASTY;  Surgeon: Meredith Pel, MD;  Location: [place];  Service: Orthopedics;  Laterality: Right;   TOTAL KNEE ARTHROPLASTY Left 07/07/2020   Procedure: LEFT TOTAL KNEE ARTHROPLASTY-CEMENTED;  Surgeon: Meredith Pel, MD;  Location: [place];  Service: Orthopedics;  Laterality: Left;    There were no vitals filed for this visit.   Subjective Assessment - 08/20/20 0936     Subjective [name] [name] is seeing Dr. Marlou Sa 08/26/2020.  She reports being compliant with her 100/day quadriceps sets.    Patient is accompained by: Interpreter    Pertinent History right TKA, DM2, A-Fib, HTN, OA, Covid-19    Limitations Lifting;Standing;Walking;House hold activities     How long can you sit comfortably? 30 minutes    How long can you stand comfortably? 10 minutes (was 5 minutes)    How long can you walk comfortably? 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 Yes    Pain Score 5     Pain Location Knee    Pain Orientation Left    Pain Descriptors / Indicators Aching;Sore;Tightness    Pain Type Chronic pain;Surgical pain    Pain Radiating Towards NA    Pain Onset More than a month ago    Pain Frequency Intermittent    Aggravating Factors  Prolonged postures (particularly WB)    Pain Relieving Factors Tramadol 3X/day and is sleeping better    Effect of Pain on Daily Activities Endurance and gait quality.  Using the walker less and less.    Multiple Pain Sites No                [facility] PT Assessment - 08/20/20 0001       PROM   Left Knee Extension 0    Left Knee Flexion 98  [place] Adult PT Treatment/Exercise - 08/20/20 0001       Exercises   Exercises Knee/Hip      Knee/Hip Exercises: Stretches   Knee: Self-Stretch Limitations LLE with RLE overpressure 10 x 10 sec hold      Knee/Hip Exercises: Aerobic   Recumbent Bike 8 minutes Seat 6 for AROM only (full range a few times)      Knee/Hip Exercises: Machines for Strengthening   Total Gym Leg Press 87# 15X full extension to as much flexion as possible with slow eccentrics      Knee/Hip Exercises: Seated   Other Seated Knee/Hip Exercises Seated tailgate knee flexion 2 minutes emphasis on pull (flexion)    Sit to Sand 5 reps;without UE support      Knee/Hip Exercises: Supine   Quad Sets Strengthening;Both;2 sets;10 reps;Limitations    Quad Sets Limitations 5 seconds (toes back, press down and tighten thighs)    Heel Slides AAROM;Left;1 set;10 reps;Limitations     Heel Slides Limitations 10 seconds with belt      Modalities   Modalities Vasopneumatic      Vasopneumatic   Number Minutes Vasopneumatic  10 minutes    Vasopnuematic Location  Knee    Vasopneumatic Pressure Medium    Vasopneumatic Temperature  34                    PT Education - 08/20/20 1120     Education Details Progression with knee flexion AROM on the bike.  Reinforced the importance of frequent HEP compliance multiple times/day.    Person(s) Educated Patient    Methods Explanation;Demonstration;Tactile cues;Verbal cues    Comprehension Verbalized understanding;Tactile cues required;Need further instruction;Returned demonstration;Verbal cues required              PT Short Term Goals - 08/13/20 1011       PT SHORT TERM GOAL #1   Title Pt will be I with initial HEP for knee AROM and strength    Time 1    Period Months    Status Achieved    Target Date 08/21/20      PT SHORT TERM GOAL #2   Title Pt will be able to perform SLR on left LE without assist for improved sit to supine    Time 1    Period Months    Status Achieved    Target Date 08/21/20      PT SHORT TERM GOAL #3   Title Pt will report less pain in left knee when sitting for improved comfort and rest at night    Time 1    Period Months    Status Achieved    Target Date 08/21/20               PT Long Term Goals - 08/20/20 1122       PT LONG TERM GOAL #1   Title Patient will demonstrate/report pain at worst less than or equal to 2/10 to facilitate minimal limitation in daily activity secondary to pain symptoms.    Baseline Still using Tramedol 3X/day, encouraged before bed with switch to tylenol during the day.    Time 10    Period Weeks    Status On-going    Target Date 09/25/20      PT LONG TERM GOAL #2   Title Patient will demonstrate independent use of home exercise program to facilitate ability to maintain/progress functional gains from skilled physical therapy services.     Time 10  Period Weeks    Status On-going    Target Date 09/25/20      PT LONG TERM GOAL #3   Title Patient will demonstrate independent ambulation community distances > 300 ft to facilitate community integration at [facility].    Baseline Very slow without the walker on 08/13/2020.    Time 10    Period Weeks    Status On-going    Target Date 09/25/20      PT LONG TERM GOAL #4   Title Pt. will demonstrate Left knee AORM 0-100 deg to facilitate ability to perform transfers, walking, stairs at PLOF s limitation.    Baseline Extension is great, flexion 98 AAROM 08/20/2020    Time 10    Period Weeks    Status Partially Met    Target Date 09/25/20      PT LONG TERM GOAL #5   Title Pt. will demonstrate left knee MMT >/= 4/5 throughout to facilitate independent ambulation, stair navigation, transfers at PLOF.    Baseline Improving but quadriceps weakness is still present    Time 10    Period Weeks    Status On-going    Target Date 09/25/20                   Plan - 08/20/20 1123     Clinical Impression Statement [name] [name] was able to complete 10 full revolutions on the bike today and had flexion AAROM at 98 degrees.  With continued work in the clinic and at home she will likely obtain 100+ degrees of flexion AROM and have better gait quality (currently using a cane, was walker and was cane pre-surgery).  Continue knee flexion AROM and quadriceps strength emphasis with balance and gait activities as time allows.    Personal Factors and Comorbidities Age;Comorbidity 3+;Transportation    Comorbidities right TKA, DM2, A-Fib, HTN, OA, Covid-19    Examination-Activity Limitations Bed Mobility;Locomotion Level;Stairs;Stand;Transfers    Examination-Participation Restrictions Community Activity    Stability/Clinical Decision Making Stable/Uncomplicated    Rehab Potential Good    PT Frequency 3x / week   2x first week, 3x/wk for 2 weeks, then 2x/wk for 7 weeks   PT Duration 3 weeks   10  weeks   PT Treatment/Interventions ADLs/Self Care Home Management;Electrical Stimulation;Moist Heat;Cryotherapy;DME Instruction;Gait training;Stair training;Functional mobility training;Therapeutic activities;Therapeutic exercise;Balance training;Neuromuscular re-education;Patient/family education;Manual techniques;Scar mobilization;Passive range of motion;Vasopneumatic Device;Joint Manipulations    PT Next Visit Plan Flexion AROM, quadriceps strength, transition to a cane (review), balance    PT Home Exercise Plan Access Code: [email or web address]    Consulted and Agree with Plan of Care Patient             Patient will benefit from skilled therapeutic intervention in order to improve the following deficits and impairments:  Abnormal gait, Decreased activity tolerance, Decreased balance, Decreased endurance, Decreased coordination, Decreased knowledge of use of DME, Decreased mobility, Decreased range of motion, Decreased skin integrity, Decreased scar mobility, Decreased strength, Increased edema, Postural dysfunction, Obesity, Pain  Visit Diagnosis: Difficulty in walking, not elsewhere classified  Muscle weakness (generalized)  Stiffness of left knee, not elsewhere classified  Localized edema  Chronic pain of left knee     Problem List Patient Active Problem List   Diagnosis Date Noted   Arthritis of left knee    S/P TKR (total knee replacement), 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 [place], [place], [address] Phone: [phone]   Fax:  [phone]  Name:  [name] [name] MRN: [id number] Date of Birth: 03/23/1950

## 2020-08-25 ENCOUNTER — Other Ambulatory Visit: Payer: Self-pay

## 2020-08-25 ENCOUNTER — Ambulatory Visit (INDEPENDENT_AMBULATORY_CARE_PROVIDER_SITE_OTHER): Payer: Self-pay | Admitting: Physical Therapy

## 2020-08-25 ENCOUNTER — Encounter: Payer: Self-pay | Admitting: Physical Therapy

## 2020-08-25 DIAGNOSIS — R262 Difficulty in walking, not elsewhere classified: Secondary | ICD-10-CM

## 2020-08-25 DIAGNOSIS — R600 Localized edema: Secondary | ICD-10-CM

## 2020-08-25 DIAGNOSIS — R2681 Unsteadiness on feet: Secondary | ICD-10-CM

## 2020-08-25 DIAGNOSIS — M25662 Stiffness of left knee, not elsewhere classified: Secondary | ICD-10-CM

## 2020-08-25 DIAGNOSIS — G8929 Other chronic pain: Secondary | ICD-10-CM

## 2020-08-25 DIAGNOSIS — M25562 Pain in left knee: Secondary | ICD-10-CM

## 2020-08-25 DIAGNOSIS — M6281 Muscle weakness (generalized): Secondary | ICD-10-CM

## 2020-08-25 NOTE — Therapy (Signed)
[facility] Physical Therapy [address] [place], [place], [address] Phone: [phone]   Fax:  [phone]  Physical Therapy Treatment  Patient Details  Name: [name] [name] MRN: [id number] Date of Birth: 11-27-1950 Referring Provider (PT): Meredith Pel, MD   Encounter Date: 08/25/2020   PT End of Session - 08/25/20 0925     Visit Number 12    Number of Visits 22    Date for PT Re-Evaluation 09/25/20    Authorization Type [facility] Assistance thru 10/02/20    PT Start Time 0925    PT Stop Time 1025    PT Time Calculation (min) 60 min    Equipment Utilized During Treatment Gait belt    Activity Tolerance Patient tolerated treatment well;Patient limited by fatigue    Behavior During Therapy [facility] for tasks assessed/performed             Past Medical History:  Diagnosis Date   Acute kidney injury ([place]) 03/02/2019   Acute respiratory failure with hypoxia ([place]) 02/22/2019   Atrial fibrillation with RVR ([place]) 02/26/2019   COVID-19 02/22/2019   Dysrhythmia    para. atrial fibrillation   Flu-like symptoms 02/22/2019   Hypertension    Knee osteoarthritis 10/24/2017   Pneumonia     Past Surgical History:  Procedure Laterality Date   NO PAST SURGERIES     TOTAL KNEE ARTHROPLASTY Right 12/03/2019   Procedure: RIGHT TOTAL KNEE ARTHROPLASTY;  Surgeon: Meredith Pel, MD;  Location: [place];  Service: Orthopedics;  Laterality: Right;   TOTAL KNEE ARTHROPLASTY Left 07/07/2020   Procedure: LEFT TOTAL KNEE ARTHROPLASTY-CEMENTED;  Surgeon: Meredith Pel, MD;  Location: [place];  Service: Orthopedics;  Laterality: Left;    There were no vitals filed for this visit.   Subjective Assessment - 08/25/20 0926     Subjective Her exercises are going well.    Patient is accompained by: Interpreter    Pertinent History right TKA, DM2, A-Fib, HTN, OA, Covid-19    Limitations Lifting;Standing;Walking;House hold activities    How long can you sit  comfortably? 30 minutes    How long can you stand comfortably? 10 minutes (was 5 minutes)    How long can you walk comfortably? 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 Yes    Pain Score 5    with knee flexion 7/10   Pain Location Knee    Pain Orientation Left    Pain Descriptors / Indicators Aching;Sore    Pain Type Surgical pain    Pain Onset More than a month ago    Pain Frequency Constant    Aggravating Factors  flexing knee    Pain Relieving Factors meds, rest, ice    Effect of Pain on Daily Activities endurance & gait quality.                               [facility] Adult PT Treatment/Exercise - 08/25/20 0925       Ambulation/Gait   Ambulation/Gait Yes    Ambulation/Gait Assistance 5: Supervision    Ambulation/Gait Assistance Details pt reports she has a forearm crutch at home not cane so PT worked on gait with crutch.  PT recommended using crutch  in home only for now until muscle endurance improves.    Ambulation Distance (Feet) 100 Feet    Assistive device R Forearm Crutch;Straight cane    Ambulation Surface Level;Indoor      Exercises   Exercises Knee/Hip      Knee/Hip Exercises: Stretches   Active Hamstring Stretch Right;Left;2 reps;30 seconds    Active Hamstring Stretch Limitations supine SLR w/ strap DF    Quad Stretch Left;2 reps;30 seconds    Quad Stretch Limitations supine hooklying with LLE over edge & strap to flex knee    Knee: Self-Stretch Limitations --      Knee/Hip Exercises: Aerobic   Recumbent Bike 8 minutes Seat 6 for AROM. partial revolutions 1st 2 min, then PT AA for 6 min, last 2 min pt able to perfrom full revolutions herself.      Knee/Hip Exercises: Machines for Strengthening   Total Gym Leg Press 93# 15X 2 sets 1st set back 45* & 2nd set  back flat -  full extension to as much flexion as possible with slow eccentrics      Knee/Hip Exercises: Standing   Forward Step Up Right;Left;1 set;5 reps;Hand Hold: 2;Step Height: 6"    Forward Step Up Limitations PT demo & verbal cues on technique.    Step Down Right;Left;1 set;5 reps;Hand Hold: 2;Step Height: 6"    Step Down Limitations PT demo & verbal cues on technique.    Rocker Board 1 minute   ant/post & right/left   Theatre stage manager, demo & verbal cues on technique      Knee/Hip Exercises: Seated   Other Seated Knee/Hip Exercises --    Sit to [employer] 5 reps;without UE support      Knee/Hip Exercises: Supine   Quad Sets --    Heel Slides --    Heel Slides Limitations --      Modalities   Modalities Vasopneumatic      Vasopneumatic   Number Minutes Vasopneumatic  10 minutes    Vasopnuematic Location  Knee    Vasopneumatic Pressure Medium    Vasopneumatic Temperature  34                      PT Short Term Goals - 08/13/20 1011       PT SHORT TERM GOAL #1   Title Pt will be I with initial HEP for knee AROM and strength    Time 1    Period Months    Status Achieved    Target Date 08/21/20      PT SHORT TERM GOAL #2   Title Pt will be able to perform SLR on left LE without assist for improved sit to supine    Time 1    Period Months    Status Achieved    Target Date 08/21/20      PT SHORT TERM GOAL #3   Title Pt will report less pain in left knee when sitting for improved comfort and rest at night    Time 1    Period Months    Status Achieved    Target Date 08/21/20               PT Long Term Goals - 08/20/20 1122       PT LONG TERM GOAL #1   Title Patient will demonstrate/report pain at worst less than or equal to 2/10 to facilitate minimal limitation in daily activity secondary to pain symptoms.  Baseline Still using Tramedol 3X/day, encouraged before bed with switch to tylenol during the day.    Time 10    Period  Weeks    Status On-going    Target Date 09/25/20      PT LONG TERM GOAL #2   Title Patient will demonstrate independent use of home exercise program to facilitate ability to maintain/progress functional gains from skilled physical therapy services.    Time 10    Period Weeks    Status On-going    Target Date 09/25/20      PT LONG TERM GOAL #3   Title Patient will demonstrate independent ambulation community distances > 300 ft to facilitate community integration at [facility].    Baseline Very slow without the walker on 08/13/2020.    Time 10    Period Weeks    Status On-going    Target Date 09/25/20      PT LONG TERM GOAL #4   Title Pt. will demonstrate Left knee AORM 0-100 deg to facilitate ability to perform transfers, walking, stairs at PLOF s limitation.    Baseline Extension is great, flexion 98 AAROM 08/20/2020    Time 10    Period Weeks    Status Partially Met    Target Date 09/25/20      PT LONG TERM GOAL #5   Title Pt. will demonstrate left knee MMT >/= 4/5 throughout to facilitate independent ambulation, stair navigation, transfers at PLOF.    Baseline Improving but quadriceps weakness is still present    Time 10    Period Weeks    Status On-going    Target Date 09/25/20                   Plan - 08/25/20 0925     Clinical Impression Statement Patient reports she feels bike helps her leg feel better. PT was able to progress to patient performing full revolutions.  PT progressed to standing functional knee control exercises.    Personal Factors and Comorbidities Age;Comorbidity 3+;Transportation    Comorbidities right TKA, DM2, A-Fib, HTN, OA, Covid-19    Examination-Activity Limitations Bed Mobility;Locomotion Level;Stairs;Stand;Transfers    Examination-Participation Restrictions Community Activity    Stability/Clinical Decision Making Stable/Uncomplicated    Rehab Potential Good    PT Frequency 3x / week   2x first week, 3x/wk for 2 weeks, then 2x/wk for 7  weeks   PT Duration 3 weeks   10 weeks   PT Treatment/Interventions ADLs/Self Care Home Management;Electrical Stimulation;Moist Heat;Cryotherapy;DME Instruction;Gait training;Stair training;Functional mobility training;Therapeutic activities;Therapeutic exercise;Balance training;Neuromuscular re-education;Patient/family education;Manual techniques;Scar mobilization;Passive range of motion;Vasopneumatic Device;Joint Manipulations    PT Next Visit Plan Flexion AROM, quadriceps strength, transition to forearm crutch including ramps & curbs for community mobility, balance    PT Home Exercise Plan Access Code: [email or web address]    Consulted and Agree with Plan of Care Patient             Patient will benefit from skilled therapeutic intervention in order to improve the following deficits and impairments:  Abnormal gait, Decreased activity tolerance, Decreased balance, Decreased endurance, Decreased coordination, Decreased knowledge of use of DME, Decreased mobility, Decreased range of motion, Decreased skin integrity, Decreased scar mobility, Decreased strength, Increased edema, Postural dysfunction, Obesity, Pain  Visit Diagnosis: Difficulty in walking, not elsewhere classified  Muscle weakness (generalized)  Stiffness of left knee, not elsewhere classified  Localized edema  Chronic pain of left knee  Unsteadiness on feet     Problem List Patient Active  Problem List   Diagnosis Date Noted   Arthritis of left knee    S/P TKR (total knee replacement), 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

## 2020-08-26 ENCOUNTER — Encounter: Payer: Self-pay | Admitting: Family

## 2020-08-26 ENCOUNTER — Ambulatory Visit: Payer: Self-pay | Attending: Physician Assistant | Admitting: Family

## 2020-08-26 ENCOUNTER — Telehealth: Payer: Self-pay

## 2020-08-26 ENCOUNTER — Other Ambulatory Visit: Payer: Self-pay

## 2020-08-26 ENCOUNTER — Other Ambulatory Visit: Payer: Self-pay | Admitting: Family Medicine

## 2020-08-26 VITALS — BP 171/96 | HR 70 | Resp 16 | Wt 218.0 lb

## 2020-08-26 DIAGNOSIS — K5900 Constipation, unspecified: Secondary | ICD-10-CM

## 2020-08-26 DIAGNOSIS — H547 Unspecified visual loss: Secondary | ICD-10-CM

## 2020-08-26 DIAGNOSIS — I10 Essential (primary) hypertension: Secondary | ICD-10-CM

## 2020-08-26 DIAGNOSIS — M25562 Pain in left knee: Secondary | ICD-10-CM

## 2020-08-26 DIAGNOSIS — I482 Chronic atrial fibrillation, unspecified: Secondary | ICD-10-CM

## 2020-08-26 DIAGNOSIS — E7800 Pure hypercholesterolemia, unspecified: Secondary | ICD-10-CM

## 2020-08-26 DIAGNOSIS — K2100 Gastro-esophageal reflux disease with esophagitis, without bleeding: Secondary | ICD-10-CM

## 2020-08-26 MED ORDER — AMLODIPINE BESYLATE 5 MG PO TABS
5.0000 mg | ORAL_TABLET | Freq: Every day | ORAL | 0 refills | Status: DC
Start: 2020-08-26 — End: 2020-09-25
  Filled 2020-08-26: qty 30, 30d supply, fill #0

## 2020-08-26 MED ORDER — GABAPENTIN 100 MG PO CAPS
ORAL_CAPSULE | Freq: Three times a day (TID) | ORAL | 1 refills | Status: DC
  Filled 2020-08-26: qty 90, 30d supply, fill #0
  Filled 2020-09-25: qty 90, 30d supply, fill #1

## 2020-08-26 MED ORDER — ROSUVASTATIN CALCIUM 10 MG PO TABS
10.0000 mg | ORAL_TABLET | Freq: Every day | ORAL | 1 refills | Status: DC
  Filled 2020-08-26: qty 30, 30d supply, fill #0
  Filled 2020-09-25: qty 30, 30d supply, fill #1
  Filled 2020-11-09: qty 30, 30d supply, fill #2
  Filled 2020-12-10: qty 30, 30d supply, fill #3
  Filled 2021-01-12: qty 30, 30d supply, fill #4
  Filled 2021-02-15: qty 30, 30d supply, fill #0

## 2020-08-26 MED ORDER — POLYETHYLENE GLYCOL 3350 17 GM/SCOOP PO POWD
17.0000 g | Freq: Every day | ORAL | 0 refills | Status: AC
  Filled 2020-08-26: qty 238, 14d supply, fill #0

## 2020-08-26 MED ORDER — DILTIAZEM HCL ER COATED BEADS 120 MG PO CP24
ORAL_CAPSULE | Freq: Every day | ORAL | 2 refills | Status: DC
  Filled 2020-09-25: qty 30, 30d supply, fill #0
  Filled 2020-11-09: qty 30, 30d supply, fill #1
  Filled 2020-12-10: qty 30, 30d supply, fill #2
  Filled 2021-01-12: qty 30, 30d supply, fill #3
  Filled 2021-02-15: qty 30, 30d supply, fill #0
  Filled 2021-03-17: qty 30, 30d supply, fill #1
  Filled 2021-04-21: qty 30, 30d supply, fill #2
  Filled 2021-05-20: qty 30, 30d supply, fill #3

## 2020-08-26 MED ORDER — DOCUSATE SODIUM 100 MG PO CAPS
100.0000 mg | ORAL_CAPSULE | Freq: Every day | ORAL | 2 refills | Status: AC
  Filled 2020-08-26: qty 30, 30d supply, fill #0

## 2020-08-26 MED ORDER — PANTOPRAZOLE SODIUM 40 MG PO TBEC
DELAYED_RELEASE_TABLET | ORAL | 3 refills | Status: DC
  Filled 2020-09-25: qty 30, 30d supply, fill #0
  Filled 2020-11-09: qty 30, 30d supply, fill #1
  Filled 2020-12-10: qty 30, 30d supply, fill #2
  Filled 2021-01-12: qty 30, 30d supply, fill #3
  Filled 2021-02-15: qty 30, 30d supply, fill #0
  Filled 2021-03-17: qty 30, 30d supply, fill #1
  Filled 2021-04-21: qty 30, 30d supply, fill #2
  Filled 2021-05-20: qty 30, 30d supply, fill #3

## 2020-08-26 MED ORDER — APIXABAN 5 MG PO TABS
5.0000 mg | ORAL_TABLET | Freq: Two times a day (BID) | ORAL | 1 refills | Status: DC
  Filled 2020-09-25: qty 60, 30d supply, fill #0
  Filled 2020-11-09: qty 180, 90d supply, fill #1

## 2020-08-26 MED FILL — Pantoprazole Sodium EC Tab 40 MG (Base Equiv): ORAL | 30 days supply | Qty: 30 | Fill #3 | Status: AC

## 2020-08-26 MED FILL — Diltiazem HCl Coated Beads Cap ER 24HR 120 MG: ORAL | 30 days supply | Qty: 30 | Fill #3 | Status: AC

## 2020-08-26 NOTE — Progress Notes (Signed)
[name] [name], is a 70 y.o. female  HQP:[id number]  ZLD:[id number]  DOB - [birth year]  Subjective:  Chief Complaint and HPI: [name] [name] is a 70 y.o. female here today with complaints of pain to the left knee as well as medications refill.  Patient had left knee replacement a few months ago she is under the care of orthopedic surgery team.  But she says since she had surgery she has been having difficulty ambulating with the left lower extremity and now it feels heavy.  Patient goes to physical therapy regularly and has an upcoming appointment tomorrow August 27, 2020.  She also has an upcoming appointment with Ortho.  This visit was done with the help of [name] interpreter.  Patient denies any chest pain, shortness of breath ,fever ,chills, endorses having constipation, bowel movement 2 days ago.  Patient says she has been having some blurry vision for the past 3 years since she moved to this country, has not had any eye exam since she moved to the area.  ED/Hospital notes reviewed.    ROS:   Constitutional:  No f/c, No night sweats, No unexplained weight loss. EENT: Reports some vision change, blurry vision, No hearing changes. No mouth, throat, or ear problems.  Respiratory: No cough, No SOB Cardiac: No CP, no palpitations GI:  N reports constipation  GU: No Urinary s/sx Musculoskeletal: Pain/heaviness to left knee Neuro: No headache, no dizziness, no motor weakness.  Endocrine:  No polydipsia. No polyuria.  Psych: Denies SI/HI  No problems updated.  ALLERGIES: No Known Allergies  PAST MEDICAL HISTORY: Past Medical History:  Diagnosis Date   Acute kidney injury (HCC) 03/02/2019   Acute respiratory failure with hypoxia (HCC) 02/22/2019   Atrial fibrillation with RVR (HCC) 02/26/2019   COVID-19 02/22/2019   Dysrhythmia    para. atrial fibrillation   Flu-like symptoms 02/22/2019   Hypertension    Knee osteoarthritis 10/24/2017   Pneumonia     MEDICATIONS  AT HOME: Prior to Admission medications   Medication Sig Start Date End Date Taking? Authorizing Provider  polyethylene glycol powder (GLYCOLAX/MIRALAX) 17 GM/SCOOP powder Take 17 g by mouth daily for 14 days. 08/26/20 09/09/20 Yes Eleonore Chiquito, FNP  amLODipine (NORVASC) 5 MG tablet Take 1 tablet (5 mg total) by mouth daily. 08/26/20 09/25/20  Eleonore Chiquito, FNP  apixaban (ELIQUIS) 5 MG TABS tablet Take 1 tablet (5 mg total) by mouth 2 (two) times daily. 08/26/20   Eleonore Chiquito, FNP  diltiazem (CARDIZEM CD) 120 MG 24 hr capsule TAKE 1 CAPSULE (120 MG TOTAL) BY MOUTH DAILY. 08/26/20 08/26/21  Eleonore Chiquito, FNP  docusate sodium (COLACE) 100 MG capsule Take 1 capsule (100 mg total) by mouth daily. 08/26/20 09/25/20  Eleonore Chiquito, FNP  gabapentin (NEURONTIN) 100 MG capsule TAKE 1 CAPSULE (100 MG TOTAL) BY MOUTH 3 (THREE) TIMES DAILY. 01/17/20 01/16/21  Hoy Register, MD  methocarbamol (ROBAXIN) 500 MG tablet Take 1 tablet (500 mg total) by mouth every 8 (eight) hours as needed for muscle spasms. 07/11/20   Tarry Kos, MD  pantoprazole (PROTONIX) 40 MG tablet TAKE 1 TABLET (40 MG TOTAL) BY MOUTH DAILY. TO REDUCE STOMACH ACID 08/26/20 08/26/21  Eleonore Chiquito, FNP  rosuvastatin (CRESTOR) 10 MG tablet Take 1 tablet (10 mg total) by mouth daily. To lower cholesterol 08/26/20   Eleonore Chiquito, FNP  traMADol (ULTRAM) 50 MG tablet Take 1-2 tablets (50-100 mg total) by mouth every 6 (six) hours as needed. 08/04/20 08/04/21  Magnant, Joycie Peek,  PA-C     Objective:  EXAM:   Vitals:   08/26/20 1502  BP: (!) 171/96  Pulse: 70  Resp: 16  SpO2: 97%  Weight: 218 lb (98.9 kg)    General appearance : A&OX3. NAD. Non-toxic-appearing HEENT: Atraumatic and Normocephalic.  PERRLA. EOM intact.  TM clear B. Mouth-MMM, post pharynx WNL w/o erythema, No PND. Neck: supple, no JVD. No cervical lymphadenopathy. No thyromegaly Chest/Lungs:  Breathing-non-labored, Good air entry bilaterally, breath sounds  normal without rales, rhonchi, or wheezing  CVS: S1 S2 regular, no murmurs, gallops, rubs  Abdomen: Bowel sounds present, Non tender and not distended with no gaurding, rigidity or rebound. Extremities: Bilateral Lower Ext shows no edema, both legs are warm to touch with = pulse throughout Neurology:  CN II-XII grossly intact, Non focal.   Psych:  TP linear. J/I WNL. Normal speech. Appropriate eye contact and affect.  Skin:  No Rash  Data Review Lab Results  Component Value Date   HGBA1C 5.0 03/25/2020   HGBA1C 6.0 (H) 12/04/2019   HGBA1C 5.3 10/11/2019     Assessment & Plan   1. Essential hypertension -Feels or her blood pressure medications - diltiazem (CARDIZEM CD) 120 MG 24 hr capsule; TAKE 1 CAPSULE (120 MG TOTAL) BY MOUTH DAILY.  Dispense: 90 capsule; Refill: 2 - rosuvastatin (CRESTOR) 10 MG tablet; Take 1 tablet (10 mg total) by mouth daily. To lower cholesterol  Dispense: 90 tablet; Refill: 1 -Directed to take medications as prescribed and exercise.  Eat plant-based diet -Report new or worsening symptoms to the clinic or local ER  2. Chronic atrial fibrillation (HCC) -Do not wait to run out of medications, call or come back to the clinic - diltiazem (CARDIZEM CD) 120 MG 24 hr capsule; TAKE 1 CAPSULE (120 MG TOTAL) BY MOUTH DAILY.  Dispense: 90 capsule; Refill: 2 - apixaban (ELIQUIS) 5 MG TABS tablet; Take 1 tablet (5 mg total) by mouth 2 (two) times daily.  Dispense: 180 tablet; Refill: 1 - amLODipine (NORVASC) 5 MG tablet; Take 1 tablet (5 mg total) by mouth daily.  Dispense: 30 tablet; Refill: 0  3. Left knee pain, unspecified chronicity -Follow-up with Ortho -Continue working with physical therapy -Take Tylenol as needed  4. Constipation, unspecified constipation type -Stay hydrated -You can use prune juice - polyethylene glycol powder (GLYCOLAX/MIRALAX) 17 GM/SCOOP powder; Take 17 g by mouth daily for 14 days.  Dispense: 238 g; Refill: 0 - docusate sodium (COLACE)  100 MG capsule; Take 1 capsule (100 mg total) by mouth daily.  Dispense: 30 capsule; Refill: 2  5. Hypercholesterolemia  - rosuvastatin (CRESTOR) 10 MG tablet; Take 1 tablet (10 mg total) by mouth daily. To lower cholesterol  Dispense: 90 tablet; Refill: 1  6. Gastroesophageal reflux disease with esophagitis, unspecified whether hemorrhage  - pantoprazole (PROTONIX) 40 MG tablet; TAKE 1 TABLET (40 MG TOTAL) BY MOUTH DAILY. TO REDUCE STOMACH ACID  Dispense: 90 tablet; Refill: 3     Patient have been counseled extensively about nutrition and exercise  No follow-ups on file.  The patient was given clear instructions to go to ER or return to medical center if symptoms don't improve, worsen or new problems develop. The patient verbalized understanding. The patient was told to call to get lab results if they haven't heard anything in the next week.     Eleonore Chiquito, APRN, FNP-C [facility] and [facility] [place], [place] [phone]   08/26/2020, 3:32 PM

## 2020-08-26 NOTE — Telephone Encounter (Signed)
Patients caregiver Early called she stated the patient is still experiencing pain on her incision site, she stated the incision site is warm to touch and is swelling, Early is also requesting a rx refill for tramadol to be sent to [id number] - [place], [place] - [address]. Early also wants to get transportation set up for the patient call back:[phone]

## 2020-08-26 NOTE — Telephone Encounter (Signed)
Can she send picture tomorrow morning or make an appointment to come in Thursday/Friday?

## 2020-08-26 NOTE — Patient Instructions (Signed)
  1. Essential hypertension -Feels or her blood pressure medications - diltiazem (CARDIZEM CD) 120 MG 24 hr capsule; TAKE 1 CAPSULE (120 MG TOTAL) BY MOUTH DAILY.  Dispense: 90 capsule; Refill: 2 - rosuvastatin (CRESTOR) 10 MG tablet; Take 1 tablet (10 mg total) by mouth daily. To lower cholesterol  Dispense: 90 tablet; Refill: 1 -Directed to take medications as prescribed and exercise.  Eat plant-based diet -Report new or worsening symptoms to the clinic or local ER  2. Chronic atrial fibrillation (HCC) -Do not wait to run out of medications, call or come back to the clinic - diltiazem (CARDIZEM CD) 120 MG 24 hr capsule; TAKE 1 CAPSULE (120 MG TOTAL) BY MOUTH DAILY.  Dispense: 90 capsule; Refill: 2 - apixaban (ELIQUIS) 5 MG TABS tablet; Take 1 tablet (5 mg total) by mouth 2 (two) times daily.  Dispense: 180 tablet; Refill: 1 - amLODipine (NORVASC) 5 MG tablet; Take 1 tablet (5 mg total) by mouth daily.  Dispense: 30 tablet; Refill: 0  3. Left knee pain, unspecified chronicity -Follow-up with Ortho -Continue working with physical therapy -Take Tylenol as needed  4. Constipation, unspecified constipation type -Stay hydrated -You can use prune juice - polyethylene glycol powder (GLYCOLAX/MIRALAX) 17 GM/SCOOP powder; Take 17 g by mouth daily for 14 days.  Dispense: 238 g; Refill: 0 - docusate sodium (COLACE) 100 MG capsule; Take 1 capsule (100 mg total) by mouth daily.  Dispense: 30 capsule; Refill: 2  5. Hypercholesterolemia  - rosuvastatin (CRESTOR) 10 MG tablet; Take 1 tablet (10 mg total) by mouth daily. To lower cholesterol  Dispense: 90 tablet; Refill: 1  6. Gastroesophageal reflux disease with esophagitis, unspecified whether hemorrhage  - pantoprazole (PROTONIX) 40 MG tablet; TAKE 1 TABLET (40 MG TOTAL) BY MOUTH DAILY. TO REDUCE STOMACH ACID  Dispense: 90 tablet; Refill: 3   7.  Referred to ophthalmology.

## 2020-08-27 ENCOUNTER — Other Ambulatory Visit: Payer: Self-pay

## 2020-08-27 ENCOUNTER — Encounter: Payer: Self-pay | Admitting: Rehabilitative and Restorative Service Providers"

## 2020-08-27 ENCOUNTER — Ambulatory Visit (INDEPENDENT_AMBULATORY_CARE_PROVIDER_SITE_OTHER): Payer: Self-pay | Admitting: Physical Therapy

## 2020-08-27 DIAGNOSIS — R262 Difficulty in walking, not elsewhere classified: Secondary | ICD-10-CM

## 2020-08-27 DIAGNOSIS — G8929 Other chronic pain: Secondary | ICD-10-CM

## 2020-08-27 DIAGNOSIS — M6281 Muscle weakness (generalized): Secondary | ICD-10-CM

## 2020-08-27 DIAGNOSIS — R600 Localized edema: Secondary | ICD-10-CM

## 2020-08-27 DIAGNOSIS — M25662 Stiffness of left knee, not elsewhere classified: Secondary | ICD-10-CM

## 2020-08-27 DIAGNOSIS — M25562 Pain in left knee: Secondary | ICD-10-CM

## 2020-08-27 DIAGNOSIS — R2681 Unsteadiness on feet: Secondary | ICD-10-CM

## 2020-08-27 NOTE — Telephone Encounter (Signed)
Caregiver called back. Was advised to send photo to Luke's cell phone. Number provided

## 2020-08-27 NOTE — Therapy (Signed)
[facility] [address] [place], [place], [address] Phone: [phone]   Fax:  [phone]  Physical Therapy Treatment  Patient Details  Name: [name] [name] MRN: [id number] Date of Birth: 27-May-1950 Referring Provider (PT): Meredith Pel, MD   Encounter Date: 08/27/2020   PT End of Session - 08/27/20 1558     Visit Number 13    Number of Visits 22    Date for PT Re-Evaluation 09/25/20    Authorization Type [facility] Financial Assistance thru 10/02/20    PT Start Time 1515    PT Stop Time 1605    PT Time Calculation (min) 50 min    Equipment Utilized During Treatment Gait belt    Activity Tolerance Patient tolerated treatment well;Patient limited by fatigue    Behavior During Therapy [facility] for tasks assessed/performed             Past Medical History:  Diagnosis Date   Acute kidney injury ([place]) 03/02/2019   Acute respiratory failure with hypoxia ([place]) 02/22/2019   Atrial fibrillation with RVR ([place]) 02/26/2019   COVID-19 02/22/2019   Dysrhythmia    para. atrial fibrillation   Flu-like symptoms 02/22/2019   Hypertension    Knee osteoarthritis 10/24/2017   Pneumonia     Past Surgical History:  Procedure Laterality Date   NO PAST SURGERIES     TOTAL KNEE ARTHROPLASTY Right 12/03/2019   Procedure: RIGHT TOTAL KNEE ARTHROPLASTY;  Surgeon: Meredith Pel, MD;  Location: [facility];  Service: Orthopedics;  Laterality: Right;   TOTAL KNEE ARTHROPLASTY Left 07/07/2020   Procedure: LEFT TOTAL KNEE ARTHROPLASTY-CEMENTED;  Surgeon: Meredith Pel, MD;  Location: [facility];  Service: Orthopedics;  Laterality: Left;    There were no vitals filed for this visit.   Subjective Assessment - 08/27/20 1547     Subjective Relays out of pain meds so is getting it refilled soon, was in 10/10 knee pain last night but better today 5/10 pain overall    Patient is accompained by: Interpreter    Pertinent History right TKA, DM2, A-Fib, HTN, OA,  Covid-19    Limitations Lifting;Standing;Walking;House hold activities    How long can you sit comfortably? 30 minutes    How long can you stand comfortably? 10 minutes (was 5 minutes)    How long can you walk comfortably? 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 partial revolutions 1st 2 min, then PT AA for 1 min, last 5 min pt able to perfrom full revolutions herself.      Knee/Hip Exercises: Machines for Strengthening   Total Gym Leg Press 100# 15X 2 sets, then 50# Lt  leg only 2x15      Knee/Hip Exercises: Standing   Forward Step Up Left;10 reps;Step Height: 4";Hand Hold: 1    Step Down Left;15 reps;Hand Hold: 1;Step Height: 6"      Knee/Hip Exercises: Seated   Sit to Sand 5 reps;without UE support      Vasopneumatic   Number Minutes Vasopneumatic  10 minutes    Vasopnuematic Location  Knee    Vasopneumatic Pressure Medium    Vasopneumatic Temperature  34      Manual Therapy   Manual therapy comments Lt knee PROM and flexion mobs                      PT Short Term Goals - 08/13/20 1011       PT SHORT TERM GOAL #1   Title Pt will be I with initial HEP for knee AROM and strength    Time 1    Period Months    Status Achieved    Target Date 08/21/20      PT SHORT TERM GOAL #2    Title Pt will be able to perform SLR on left LE without assist for improved sit to supine    Time 1    Period Months    Status Achieved    Target Date 08/21/20      PT SHORT TERM GOAL #3   Title Pt will report less pain in left knee when sitting for improved comfort and rest at night    Time 1    Period Months    Status Achieved    Target Date 08/21/20               PT Long Term Goals - 08/20/20 1122       PT LONG TERM GOAL #1   Title Patient will demonstrate/report pain at worst less than or equal to 2/10 to facilitate minimal limitation in daily activity secondary to pain symptoms.    Baseline Still using Tramedol 3X/day, encouraged before bed with switch to tylenol during the day.    Time 10    Period Weeks    Status On-going    Target Date 09/25/20      PT LONG TERM GOAL #2   Title Patient will demonstrate independent use of home exercise program to facilitate ability to maintain/progress functional gains from skilled physical therapy services.    Time 10    Period Weeks    Status On-going    Target Date 09/25/20      PT LONG TERM GOAL #3   Title Patient will demonstrate independent ambulation community distances > 300 ft to facilitate community integration at [facility].    Baseline Very slow without the walker on 08/13/2020.    Time 10    Period Weeks    Status On-going    Target Date 09/25/20      PT LONG TERM GOAL #4   Title Pt. will demonstrate Left knee AORM 0-100 deg to facilitate ability to perform transfers, walking, stairs at PLOF s limitation.    Baseline Extension is great, flexion 98 AAROM 08/20/2020    Time 10    Period Weeks    Status Partially Met    Target Date 09/25/20      PT LONG TERM GOAL #5   Title Pt. will demonstrate left knee MMT >/= 4/5 throughout to facilitate independent ambulation, stair navigation, transfers at PLOF.    Baseline Improving but quadriceps weakness is still  present    Time 10    Period Weeks    Status On-going     Target Date 09/25/20                   Plan - 08/27/20 1559     Clinical Impression Statement Continued to work on Lt knee ROM and strength to her tolerance, overall had good activity tolerance and did not have complaints of increased pain. We will continue to work to improve her overall function.    Personal Factors and Comorbidities Age;Comorbidity 3+;Transportation    Comorbidities right TKA, DM2, A-Fib, HTN, OA, Covid-19    Examination-Activity Limitations Bed Mobility;Locomotion Level;Stairs;Stand;Transfers    Examination-Participation Restrictions Community Activity    Stability/Clinical Decision Making Stable/Uncomplicated    Rehab Potential Good    PT Frequency 3x / week   2x first week, 3x/wk for 2 weeks, then 2x/wk for 7 weeks   PT Duration 3 weeks   10 weeks   PT Treatment/Interventions ADLs/Self Care Home Management;Electrical Stimulation;Moist Heat;Cryotherapy;DME Instruction;Gait training;Stair training;Functional mobility training;Therapeutic activities;Therapeutic exercise;Balance training;Neuromuscular re-education;Patient/family education;Manual techniques;Scar mobilization;Passive range of motion;Vasopneumatic Device;Joint Manipulations    PT Next Visit Plan Flexion AROM, quadriceps strength, balance    PT Home Exercise Plan Access Code: [email or web address]    Consulted and Agree with Plan of Care Patient             Patient will benefit from skilled therapeutic intervention in order to improve the following deficits and impairments:  Abnormal gait, Decreased activity tolerance, Decreased balance, Decreased endurance, Decreased coordination, Decreased knowledge of use of DME, Decreased mobility, Decreased range of motion, Decreased skin integrity, Decreased scar mobility, Decreased strength, Increased edema, Postural dysfunction, Obesity, Pain  Visit Diagnosis: Difficulty in walking, not elsewhere classified  Muscle weakness (generalized)  Stiffness of left knee, not  elsewhere classified  Localized edema  Chronic pain of left knee  Unsteadiness on feet     Problem List Patient Active Problem List   Diagnosis Date Noted   Arthritis of left knee    S/P TKR (total knee replacement), 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 [place], [place], [address] Phone: [phone]   Fax:  [phone]  Name: Neville Pauls MRN: [id number] Date of Birth: 09/09/1950

## 2020-08-28 ENCOUNTER — Other Ambulatory Visit: Payer: Self-pay | Admitting: Surgical

## 2020-08-28 MED ORDER — METHOCARBAMOL 500 MG PO TABS: 500.0000 mg | ORAL_TABLET | Freq: Three times a day (TID) | ORAL | 0 refills | Status: DC | PRN

## 2020-08-28 MED ORDER — TRAMADOL HCL 50 MG PO TABS: 50.0000 mg | ORAL_TABLET | Freq: Four times a day (QID) | ORAL | 0 refills | Status: DC | PRN

## 2020-08-28 NOTE — Telephone Encounter (Signed)
I did, it looked okay and she denied any increased pain or signs of infection.  She does need appointment to follow-up with Dr. August Saucer and she was supposed to come back in mid July but never returned and doesn't have current appointment.  Recommend follow-up next week or even this afternoon if she wants

## 2020-09-01 ENCOUNTER — Encounter: Payer: Self-pay | Admitting: Physical Therapy

## 2020-09-01 ENCOUNTER — Ambulatory Visit (INDEPENDENT_AMBULATORY_CARE_PROVIDER_SITE_OTHER): Payer: Self-pay | Admitting: Physical Therapy

## 2020-09-01 ENCOUNTER — Other Ambulatory Visit: Payer: Self-pay

## 2020-09-01 DIAGNOSIS — R262 Difficulty in walking, not elsewhere classified: Secondary | ICD-10-CM

## 2020-09-01 DIAGNOSIS — M25662 Stiffness of left knee, not elsewhere classified: Secondary | ICD-10-CM

## 2020-09-01 DIAGNOSIS — M6281 Muscle weakness (generalized): Secondary | ICD-10-CM

## 2020-09-01 DIAGNOSIS — R600 Localized edema: Secondary | ICD-10-CM

## 2020-09-01 NOTE — Therapy (Signed)
[facility] [address] [place], [place], [address] Phone: [phone]   Fax:  [phone]  Physical Therapy Treatment  Patient Details  Name: [name] [name] MRN: [id number] Date of Birth: Jun 15, 1950 Referring Provider (PT): Meredith Pel, MD   Encounter Date: 09/01/2020   PT End of Session - 09/01/20 0939     Visit Number 14    Number of Visits 22    Date for PT Re-Evaluation 09/25/20    Authorization Type [facility] [employer] Assistance thru 10/02/20    PT Start Time 0931    PT Stop Time 1015    PT Time Calculation (min) 44 min    Equipment Utilized During Treatment Gait belt    Activity Tolerance Patient tolerated treatment well;Patient limited by fatigue    Behavior During Therapy [facility] for tasks assessed/performed             Past Medical History:  Diagnosis Date   Acute kidney injury ([place]) 03/02/2019   Acute respiratory failure with hypoxia ([place]) 02/22/2019   Atrial fibrillation with RVR ([place]) 02/26/2019   COVID-19 02/22/2019   Dysrhythmia    para. atrial fibrillation   Flu-like symptoms 02/22/2019   Hypertension    Knee osteoarthritis 10/24/2017   Pneumonia     Past Surgical History:  Procedure Laterality Date   NO PAST SURGERIES     TOTAL KNEE ARTHROPLASTY Right 12/03/2019   Procedure: RIGHT TOTAL KNEE ARTHROPLASTY;  Surgeon: Meredith Pel, MD;  Location: [place];  Service: Orthopedics;  Laterality: Right;   TOTAL KNEE ARTHROPLASTY Left 07/07/2020   Procedure: LEFT TOTAL KNEE ARTHROPLASTY-CEMENTED;  Surgeon: Meredith Pel, MD;  Location: [place];  Service: Orthopedics;  Laterality: Left;    There were no vitals filed for this visit.   Subjective Assessment - 09/01/20 0937     Subjective Pt states the leg feels "heavy" today. She states the pain is down when she takes her medication.    Patient is accompained by: Interpreter    Pertinent History right TKA, DM2, A-Fib, HTN, OA, Covid-19    Limitations  Lifting;Standing;Walking;House hold activities    How long can you sit comfortably? 30 minutes    How long can you stand comfortably? 10 minutes (was 5 minutes)    How long can you walk comfortably? 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 Yes    Pain Score 5     Pain Location Knee    Pain Orientation Left    Pain Descriptors / Indicators Aching;Sore    Pain Onset More than a month ago                               [facility] Adult PT Treatment/Exercise - 09/01/20 0001       Ambulation/Gait   Ambulation/Gait Yes    Ambulation/Gait Assistance 5: Supervision    Ambulation Distance (Feet) 125 Feet    Assistive device R Forearm Crutch;Straight cane    Gait Comments cuing for equal WB and increased stance time on L      Knee/Hip Exercises: Stretches   Knee: Self-Stretch Limitations LLE with RLE overpressure 10 x 10 sec hold    Gastroc Stretch Both;3 reps;30 seconds  Knee/Hip Exercises: Aerobic   Recumbent Bike 8 minutes Seat 6 for AROM. partial revolutions 1st 2 min, then PT AA for 1 min, last 5 min pt able to perfrom full revolutions herself.      Knee/Hip Exercises: Machines for Strengthening   Total Gym Leg Press 100# 15X 2 sets, then 50# Lt leg only 2x15      Knee/Hip Exercises: Standing           SLS 30s 2x   fingertip support   Other Standing Knee Exercises forward stair step flexion stretch 10s 10x    Other Standing Knee Exercises standing hip flexion march 20x single UE support; sidestepping within parallel bars      Knee/Hip Exercises: Seated   Sit to Sand 5 reps;without UE support;3 sets      Vasopneumatic   Number Minutes Vasopneumatic  10 minutes    Vasopnuematic Location  Knee    Vasopneumatic Pressure Medium    Vasopneumatic Temperature  34                     PT Education - 09/01/20 0939     Education Details knee ROM deficits, gait mechanics, HEP    Person(s) Educated Patient    Methods Explanation;Demonstration;Tactile cues;Verbal cues    Comprehension Verbalized understanding;Returned demonstration;Verbal cues required;Tactile cues required              PT Short Term Goals - 08/13/20 1011       PT SHORT TERM GOAL #1   Title Pt will be I with initial HEP for knee AROM and strength    Time 1    Period Months    Status Achieved    Target Date 08/21/20      PT SHORT TERM GOAL #2   Title Pt will be able to perform SLR on left LE without assist for improved sit to supine    Time 1    Period Months    Status Achieved    Target Date 08/21/20      PT SHORT TERM GOAL #3   Title Pt will report less pain in left knee when sitting for improved comfort and rest at night    Time 1    Period Months    Status Achieved    Target Date 08/21/20               PT Long Term Goals - 08/20/20 1122       PT LONG TERM GOAL #1   Title Patient will demonstrate/report pain at worst less than or equal to 2/10 to facilitate minimal limitation in daily activity secondary to pain symptoms.    Baseline Still using Tramedol 3X/day, encouraged before bed with switch to tylenol during the day.    Time 10    Period Weeks    Status On-going    Target Date 09/25/20      PT LONG TERM GOAL #2   Title Patient will demonstrate independent use of home exercise program to facilitate ability to maintain/progress functional gains from skilled physical therapy services.    Time 10    Period Weeks    Status On-going    Target Date 09/25/20      PT LONG TERM GOAL #3   Title Patient will demonstrate independent ambulation community distances > 300 ft to facilitate community integration at [facility].    Baseline Very slow without the walker on 08/13/2020.    Time 10    Period  Weeks    Status On-going    Target Date 09/25/20      PT LONG  TERM GOAL #4   Title Pt. will demonstrate Left knee AORM 0-100 deg to facilitate ability to perform transfers, walking, stairs at PLOF s limitation.    Baseline Extension is great, flexion 98 AAROM 08/20/2020    Time 10    Period Weeks    Status Partially Met    Target Date 09/25/20      PT LONG TERM GOAL #5   Title Pt. will demonstrate left knee MMT >/= 4/5 throughout to facilitate independent ambulation, stair navigation, transfers at PLOF.    Baseline Improving but quadriceps weakness is still present    Time 10    Period Weeks    Status On-going    Target Date 09/25/20                   Plan - 09/01/20 0943     Clinical Impression Statement Pt able to tolerate increased single leg stance time as well as increased CKC stretching on step at today's session. Pt able to perform STS without UE support for extended repetitions but has increased trunk lean and decreased knee extension during transfers. Interpreter with difficulties translating edu regarding knee flexion and stance time instructions with gait and exercise. Overall, pt is improving with functional L knee strength but flexion ROM continues to be greatest deficit at this time. Pt would benefit from continued skilled therapy in order to reach goals and maximize functional L knee strength and ROM for full return to PLOF.    Personal Factors and Comorbidities Age;Comorbidity 3+;Transportation    Comorbidities right TKA, DM2, A-Fib, HTN, OA, Covid-19    Examination-Activity Limitations Bed Mobility;Locomotion Level;Stairs;Stand;Transfers    Examination-Participation Restrictions Community Activity    Stability/Clinical Decision Making Stable/Uncomplicated    Rehab Potential Good    PT Frequency 3x / week   2x first week, 3x/wk for 2 weeks, then 2x/wk for 7 weeks   PT Duration 3 weeks   10 weeks   PT Treatment/Interventions ADLs/Self Care Home Management;Electrical Stimulation;Moist Heat;Cryotherapy;DME Instruction;Gait  training;Stair training;Functional mobility training;Therapeutic activities;Therapeutic exercise;Balance training;Neuromuscular re-education;Patient/family education;Manual techniques;Scar mobilization;Passive range of motion;Vasopneumatic Device;Joint Manipulations    PT Next Visit Plan Flexion AROM, quadriceps strength, balance    PT Home Exercise Plan Access Code: [email or web address]    Consulted and Agree with Plan of Care Patient             Patient will benefit from skilled therapeutic intervention in order to improve the following deficits and impairments:  Abnormal gait, Decreased activity tolerance, Decreased balance, Decreased endurance, Decreased coordination, Decreased knowledge of use of DME, Decreased mobility, Decreased range of motion, Decreased skin integrity, Decreased scar mobility, Decreased strength, Increased edema, Postural dysfunction, Obesity, Pain  Visit Diagnosis: Difficulty in walking, not elsewhere classified  Muscle weakness (generalized)  Stiffness of left knee, not elsewhere classified  Localized edema     Problem List Patient Active Problem List   Diagnosis Date Noted   Arthritis of left knee    S/P TKR (total knee replacement), 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 [place], [place], [address] Phone: [phone]   Fax:  [phone]  Name: [name] [name] MRN: [id number] Date of Birth: 12-14-1950

## 2020-09-03 ENCOUNTER — Ambulatory Visit (INDEPENDENT_AMBULATORY_CARE_PROVIDER_SITE_OTHER): Payer: Self-pay | Admitting: Rehabilitative and Restorative Service Providers"

## 2020-09-03 ENCOUNTER — Other Ambulatory Visit: Payer: Self-pay

## 2020-09-03 ENCOUNTER — Encounter: Payer: Self-pay | Admitting: Rehabilitative and Restorative Service Providers"

## 2020-09-03 DIAGNOSIS — G8929 Other chronic pain: Secondary | ICD-10-CM

## 2020-09-03 DIAGNOSIS — R600 Localized edema: Secondary | ICD-10-CM

## 2020-09-03 DIAGNOSIS — M25562 Pain in left knee: Secondary | ICD-10-CM

## 2020-09-03 DIAGNOSIS — R262 Difficulty in walking, not elsewhere classified: Secondary | ICD-10-CM

## 2020-09-03 DIAGNOSIS — M25662 Stiffness of left knee, not elsewhere classified: Secondary | ICD-10-CM

## 2020-09-03 DIAGNOSIS — M6281 Muscle weakness (generalized): Secondary | ICD-10-CM

## 2020-09-03 NOTE — Patient Instructions (Signed)
Continue quadriceps sets (100/day) and multiple knee flexion exercises with HEP multiple times/day

## 2020-09-03 NOTE — Therapy (Signed)
[facility] Physical Therapy [address] [place], [place], [address] Phone: [phone]   Fax:  [phone]  Physical Therapy Treatment  Patient Details  Name: [name] [name] MRN: [id number] Date of Birth: 1950-03-16 Referring Provider (PT): Meredith Pel, MD   Encounter Date: 09/03/2020   PT End of Session - 09/03/20 1414     Visit Number 15    Number of Visits 22    Date for PT Re-Evaluation 09/25/20    Authorization Type [facility] Assistance thru 10/02/20    PT Start Time 0845    PT Stop Time 0940    PT Time Calculation (min) 55 min    Activity Tolerance Patient tolerated treatment well;Patient limited by fatigue    Behavior During Therapy [facility] for tasks assessed/performed             Past Medical History:  Diagnosis Date   Acute kidney injury ([place]) 03/02/2019   Acute respiratory failure with hypoxia ([place]) 02/22/2019   Atrial fibrillation with RVR ([place]) 02/26/2019   COVID-19 02/22/2019   Dysrhythmia    para. atrial fibrillation   Flu-like symptoms 02/22/2019   Hypertension    Knee osteoarthritis 10/24/2017   Pneumonia     Past Surgical History:  Procedure Laterality Date   NO PAST SURGERIES     TOTAL KNEE ARTHROPLASTY Right 12/03/2019   Procedure: RIGHT TOTAL KNEE ARTHROPLASTY;  Surgeon: Meredith Pel, MD;  Location: [place];  Service: Orthopedics;  Laterality: Right;   TOTAL KNEE ARTHROPLASTY Left 07/07/2020   Procedure: LEFT TOTAL KNEE ARTHROPLASTY-CEMENTED;  Surgeon: Meredith Pel, MD;  Location: [place];  Service: Orthopedics;  Laterality: Left;    There were no vitals filed for this visit.   Subjective Assessment - 09/03/20 1408     Subjective No significant changes since last visit.  [name] [name] reports consistent HEP compliance.    Patient is accompained by: Interpreter    Pertinent History right TKA, DM2, A-Fib, HTN, OA, Covid-19    Limitations Lifting;Standing;Walking;House hold activities    How long can you  sit comfortably? 30 minutes    How long can you stand comfortably? 10 minutes (was 5 minutes)    How long can you walk comfortably? 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 Yes    Pain Score 5     Pain Location Knee    Pain Orientation Left    Pain Descriptors / Indicators Tightness;Heaviness    Pain Type Surgical pain    Pain Radiating Towards NA    Pain Onset More than a month ago    Pain Frequency Constant    Aggravating Factors  Too much WB and stretching at end range    Pain Relieving Factors Pain meds, ice, rest    Effect of Pain on Daily Activities Uses a single crutch with gait, increased edema at day's end, limited AROM (particularly flexion), limited endurance due to weakness    Multiple Pain Sites No                [facility] PT Assessment - 09/03/20 0001       PROM   Left Knee Extension 0    Left Knee Flexion 96  [facility] Adult PT Treatment/Exercise - 09/03/20 0001       Exercises   Exercises Knee/Hip      Knee/Hip Exercises: Stretches   Knee: Self-Stretch Limitations LLE with RLE overpressure 10 x 10 sec hold      Knee/Hip Exercises: Aerobic   Recumbent Bike 8 minutes Seat 6 full range after a few back and forth warm-ups      Knee/Hip Exercises: Machines for Strengthening   Total Gym Leg Press 87# 15X full extension to as much flexion as possible with slow eccentrics      Knee/Hip Exercises: Seated   Other Seated Knee/Hip Exercises Seated tailgate knee flexion 2 minutes emphasis on pull (flexion)      Knee/Hip Exercises: Supine   Quad Sets Strengthening;Both;2 sets;10 reps;Limitations    Quad Sets Limitations 5 seconds    Heel Slides AAROM;Left;1 set;10 reps;Limitations    Heel Slides Limitations 10 seconds with belt       Modalities   Modalities Vasopneumatic      Vasopneumatic   Number Minutes Vasopneumatic  10 minutes    Vasopnuematic Location  Knee    Vasopneumatic Pressure Medium    Vasopneumatic Temperature  34                    PT Education - 09/03/20 1412     Education Details Reviewed HEP with emphasis on quadriceps strength and knee flexion AROM    Person(s) Educated Patient    Methods Explanation;Demonstration;Tactile cues;Verbal cues    Comprehension Verbalized understanding;Need further instruction;Returned demonstration;Tactile cues required;Verbal cues required              PT Short Term Goals - 08/13/20 1011       PT SHORT TERM GOAL #1   Title Pt will be I with initial HEP for knee AROM and strength    Time 1    Period Months    Status Achieved    Target Date 08/21/20      PT SHORT TERM GOAL #2   Title Pt will be able to perform SLR on left LE without assist for improved sit to supine    Time 1    Period Months    Status Achieved    Target Date 08/21/20      PT SHORT TERM GOAL #3   Title Pt will report less pain in left knee when sitting for improved comfort and rest at night    Time 1    Period Months    Status Achieved    Target Date 08/21/20               PT Long Term Goals - 09/03/20 1413       PT LONG TERM GOAL #1   Title Patient will demonstrate/report pain at worst less than or equal to 2/10 to facilitate minimal limitation in daily activity secondary to pain symptoms.    Baseline Still using Tramedol 3X/day, encouraged before bed with switch to tylenol during the day.    Time 10    Period Weeks    Status On-going    Target Date 09/25/20      PT LONG TERM GOAL #2   Title Patient will demonstrate independent use of home exercise program to facilitate ability to maintain/progress functional gains from skilled physical therapy services.    Time 10    Period Weeks    Status On-going    Target Date 09/25/20      PT LONG TERM  GOAL #3    Title Patient will demonstrate independent ambulation community distances > 300 ft to facilitate community integration at [facility]    Baseline > 300 feet with a loftstrand crutch    Time 10    Period Weeks    Status Partially Met    Target Date 09/25/20      PT LONG TERM GOAL #4   Title Pt. will demonstrate Left knee AORM 0-100 deg to facilitate ability to perform transfers, walking, stairs at PLOF s limitation.    Baseline Extension is great, flexion 96 AAROM 09/03/2020    Time 10    Period Weeks    Status Partially Met    Target Date 09/25/20      PT LONG TERM GOAL #5   Title Pt. will demonstrate left knee MMT >/= 4/5 throughout to facilitate independent ambulation, stair navigation, transfers at PLOF.    Baseline Improving but quadriceps weakness is still present    Time 10    Period Weeks    Status On-going    Target Date 09/25/20                   Plan - 09/03/20 1415     Clinical Impression Statement [name] [name] is walking longer distances with her loftstrand crutch.  Because of increased weight-bearing, she is more swollen today.  We reviewed her home strengthening program and I reminded [name] [name] to be consistent with her home strengthening exercises to improve WB endurance, reduce edema and decrease pain.  Continue POC to meet all LTGs this month.    Personal Factors and Comorbidities Age;Comorbidity 3+;Transportation    Comorbidities right TKA, DM2, A-Fib, HTN, OA, Covid-19    Examination-Activity Limitations Bed Mobility;Locomotion Level;Stairs;Stand;Transfers    Examination-Participation Restrictions Community Activity    Stability/Clinical Decision Making Stable/Uncomplicated    Rehab Potential Good    PT Frequency 3x / week   2x first week, 3x/wk for 2 weeks, then 2x/wk for 7 weeks   PT Duration 3 weeks   10 weeks   PT Treatment/Interventions ADLs/Self Care Home Management;Electrical Stimulation;Moist Heat;Cryotherapy;DME Instruction;Gait training;Stair  training;Functional mobility training;Therapeutic activities;Therapeutic exercise;Balance training;Neuromuscular re-education;Patient/family education;Manual techniques;Scar mobilization;Passive range of motion;Vasopneumatic Device;Joint Manipulations    PT Next Visit Plan Flexion AROM, quadriceps strength, balance and gait specific activities    PT Home Exercise Plan Access Code: [email or web address]    Consulted and Agree with Plan of Care Patient             Patient will benefit from skilled therapeutic intervention in order to improve the following deficits and impairments:  Abnormal gait, Decreased activity tolerance, Decreased balance, Decreased endurance, Decreased coordination, Decreased knowledge of use of DME, Decreased mobility, Decreased range of motion, Decreased skin integrity, Decreased scar mobility, Decreased strength, Increased edema, Postural dysfunction, Obesity, Pain  Visit Diagnosis: Difficulty in walking, not elsewhere classified  Muscle weakness (generalized)  Stiffness of left knee, not elsewhere classified  Localized edema  Chronic pain of left knee     Problem List Patient Active Problem List   Diagnosis Date Noted   Arthritis of left knee    S/P TKR (total knee replacement), 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

## 2020-09-07 ENCOUNTER — Other Ambulatory Visit: Payer: Self-pay

## 2020-09-07 ENCOUNTER — Ambulatory Visit (INDEPENDENT_AMBULATORY_CARE_PROVIDER_SITE_OTHER): Payer: Self-pay | Admitting: Rehabilitative and Restorative Service Providers"

## 2020-09-07 ENCOUNTER — Encounter: Payer: Self-pay | Admitting: Rehabilitative and Restorative Service Providers"

## 2020-09-07 DIAGNOSIS — M25562 Pain in left knee: Secondary | ICD-10-CM

## 2020-09-07 DIAGNOSIS — G8929 Other chronic pain: Secondary | ICD-10-CM

## 2020-09-07 DIAGNOSIS — R262 Difficulty in walking, not elsewhere classified: Secondary | ICD-10-CM

## 2020-09-07 DIAGNOSIS — R2681 Unsteadiness on feet: Secondary | ICD-10-CM

## 2020-09-07 DIAGNOSIS — M6281 Muscle weakness (generalized): Secondary | ICD-10-CM

## 2020-09-07 DIAGNOSIS — R600 Localized edema: Secondary | ICD-10-CM

## 2020-09-07 DIAGNOSIS — M25662 Stiffness of left knee, not elsewhere classified: Secondary | ICD-10-CM

## 2020-09-07 NOTE — Therapy (Signed)
[facility] [address] [place], [place], [address] Phone: [phone]   Fax:  [phone]  Physical Therapy Treatment  Patient Details  Name: [name] [name] MRN: [id number] Date of Birth: [birth year] Referring Provider (PT): Meredith Pel, MD   Encounter Date: 09/07/2020   PT End of Session - 09/07/20 1136     Visit Number 16    Number of Visits 22    Date for PT Re-Evaluation 09/25/20    Authorization Type [facility] Assistance thru 10/02/20    PT Start Time 1055    PT Stop Time 1145    PT Time Calculation (min) 50 min    Activity Tolerance Patient tolerated treatment well    Behavior During Therapy [facility] for tasks assessed/performed             Past Medical History:  Diagnosis Date   Acute kidney injury ([place]) 03/02/2019   Acute respiratory failure with hypoxia ([place]) 02/22/2019   Atrial fibrillation with RVR ([place]) 02/26/2019   COVID-19 02/22/2019   Dysrhythmia    para. atrial fibrillation   Flu-like symptoms 02/22/2019   Hypertension    Knee osteoarthritis 10/24/2017   Pneumonia     Past Surgical History:  Procedure Laterality Date   NO PAST SURGERIES     TOTAL KNEE ARTHROPLASTY Right 12/03/2019   Procedure: RIGHT TOTAL KNEE ARTHROPLASTY;  Surgeon: Meredith Pel, MD;  Location: [place];  Service: Orthopedics;  Laterality: Right;   TOTAL KNEE ARTHROPLASTY Left 07/07/2020   Procedure: LEFT TOTAL KNEE ARTHROPLASTY-CEMENTED;  Surgeon: Meredith Pel, MD;  Location: [place];  Service: Orthopedics;  Laterality: Left;    There were no vitals filed for this visit.   Subjective Assessment - 09/07/20 1057     Subjective Pt. indicated taking medicine this morning and pain around 5-6/10.  Pt. stated she felt like the Rt leg was better quicker than the Lf.    Patient is accompained by: Interpreter    Pertinent History right TKA, DM2, A-Fib, HTN, OA, Covid-19    Limitations Lifting;Standing;Walking;House hold activities     How long can you sit comfortably? 30 minutes    How long can you stand comfortably? 10 minutes (was 5 minutes)    How long can you walk comfortably? 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 Yes    Pain Score 5     Pain Location Knee    Pain Orientation Left    Pain Descriptors / Indicators Tightness    Pain Type Surgical pain    Pain Onset More than a month ago    Pain Frequency Intermittent    Aggravating Factors  morning stiffness    Pain Relieving Factors medicine and movement helps some                               [facility] Adult PT Treatment/Exercise - 09/07/20 0001       Neuro Re-ed    Neuro Re-ed Details  tandem ambulation fwd 10 ft x 10 in bars   min to mod A from hands on bar     Knee/Hip Exercises: Aerobic   Recumbent Bike Seat 6 Full revolutions  Knee/Hip Exercises: Standing   Lateral Step Up 1 set;15 reps;Left;Step Height: 4";Hand Hold: 1    Forward Step Up Step Height: 4";1 set;10 reps;Left;Hand Hold: 1      Knee/Hip Exercises: Seated   Long Arc Quad 3 sets;10 reps   contralateral leg movement opposite, hold in flexion and extension   Long Arc Quad Weight 4 lbs.    Other Seated Knee/Hip Exercises saved self knee flexion stretching for home use due to manual inclusion    Other Seated Knee/Hip Exercises seated SLR 20 x Lt    Sit to Sand without UE support;10 reps   lt leg slightly posteriorly placed compared to Rt     Knee/Hip Exercises: Supine   Other Supine Knee/Hip Exercises held supine activity today due to wearing skirt      Vasopneumatic   Number Minutes Vasopneumatic  10 minutes    Vasopnuematic Location  Knee    Vasopneumatic Pressure Medium    Vasopneumatic Temperature  34      Manual Therapy   Manual therapy comments Lt knee  mobilization c movement c flexion, distraction, IR c contralateral leg opposite                      PT Short Term Goals - 08/13/20 1011       PT SHORT TERM GOAL #1   Title Pt will be I with initial HEP for knee AROM and strength    Time 1    Period Months    Status Achieved    Target Date 08/21/20      PT SHORT TERM GOAL #2   Title Pt will be able to perform SLR on left LE without assist for improved sit to supine    Time 1    Period Months    Status Achieved    Target Date 08/21/20      PT SHORT TERM GOAL #3   Title Pt will report less pain in left knee when sitting for improved comfort and rest at night    Time 1    Period Months    Status Achieved    Target Date 08/21/20               PT Long Term Goals - 09/03/20 1413       PT LONG TERM GOAL #1   Title Patient will demonstrate/report pain at worst less than or equal to 2/10 to facilitate minimal limitation in daily activity secondary to pain symptoms.    Baseline Still using Tramedol 3X/day, encouraged before bed with switch to tylenol during the day.    Time 10    Period Weeks    Status On-going    Target Date 09/25/20      PT LONG TERM GOAL #2   Title Patient will demonstrate independent use of home exercise program to facilitate ability to maintain/progress functional gains from skilled physical therapy services.    Time 10    Period Weeks    Status On-going    Target Date 09/25/20      PT LONG TERM GOAL #3   Title Patient will demonstrate independent ambulation community distances > 300 ft to facilitate community integration at [facility].    Baseline > 300 feet with a loftstrand crutch    Time 10    Period Weeks    Status Partially Met    Target Date 09/25/20      PT LONG TERM GOAL #4   Title Pt.  will demonstrate Left knee AORM 0-100 deg to facilitate ability to perform transfers, walking, stairs at PLOF s limitation.    Baseline Extension is great, flexion 96 AAROM 09/03/2020    Time 10     Period Weeks    Status Partially Met    Target Date 09/25/20      PT LONG TERM GOAL #5   Title Pt. will demonstrate left knee MMT >/= 4/5 throughout to facilitate independent ambulation, stair navigation, transfers at PLOF.    Baseline Improving but quadriceps weakness is still present    Time 10    Period Weeks    Status On-going    Target Date 09/25/20                   Plan - 09/07/20 1134     Clinical Impression Statement Pt. expressed several times during her visit today that she was excited to perform further standing and weight strengthening and was happy with manual intervention to promote her mobility.  Pt. stated these interventions previously helped well for her Rt leg.  In clinic today, fair control on movement coordination intervention requiring hands on bars to correct balance. Strengthening in [place] was received well (plan for machine next visit) .    Personal Factors and Comorbidities Age;Comorbidity 3+;Transportation    Comorbidities right TKA, DM2, A-Fib, HTN, OA, Covid-19    Examination-Activity Limitations Bed Mobility;Locomotion Level;Stairs;Stand;Transfers    Examination-Participation Restrictions Community Activity    Stability/Clinical Decision Making Stable/Uncomplicated    Rehab Potential Good    PT Frequency 3x / week   2x first week, 3x/wk for 2 weeks, then 2x/wk for 7 weeks   PT Duration 3 weeks   10 weeks   PT Treatment/Interventions ADLs/Self Care Home Management;Electrical Stimulation;Moist Heat;Cryotherapy;DME Instruction;Gait training;Stair training;Functional mobility training;Therapeutic activities;Therapeutic exercise;Balance training;Neuromuscular re-education;Patient/family education;Manual techniques;Scar mobilization;Passive range of motion;Vasopneumatic Device;Joint Manipulations    PT Next Visit Plan Manual for flexion gains, quad extension eccentric on machine, stairs, static balance.    PT Home Exercise Plan Access Code: [email or web address]     Consulted and Agree with Plan of Care Patient             Patient will benefit from skilled therapeutic intervention in order to improve the following deficits and impairments:  Abnormal gait, Decreased activity tolerance, Decreased balance, Decreased endurance, Decreased coordination, Decreased knowledge of use of DME, Decreased mobility, Decreased range of motion, Decreased skin integrity, Decreased scar mobility, Decreased strength, Increased edema, Postural dysfunction, Obesity, Pain  Visit Diagnosis: Muscle weakness (generalized)  Difficulty in walking, not elsewhere classified  Localized edema  Chronic pain of left knee  Stiffness of left knee, not elsewhere classified  Unsteadiness on feet     Problem List Patient Active Problem List   Diagnosis Date Noted   Arthritis of left knee    S/P TKR (total knee replacement), 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

## 2020-09-10 ENCOUNTER — Encounter: Payer: Self-pay | Admitting: Rehabilitative and Restorative Service Providers"

## 2020-09-10 ENCOUNTER — Other Ambulatory Visit: Payer: Self-pay

## 2020-09-10 ENCOUNTER — Ambulatory Visit (INDEPENDENT_AMBULATORY_CARE_PROVIDER_SITE_OTHER): Payer: Self-pay | Admitting: Rehabilitative and Restorative Service Providers"

## 2020-09-10 DIAGNOSIS — R2681 Unsteadiness on feet: Secondary | ICD-10-CM

## 2020-09-10 DIAGNOSIS — M25662 Stiffness of left knee, not elsewhere classified: Secondary | ICD-10-CM

## 2020-09-10 DIAGNOSIS — M25562 Pain in left knee: Secondary | ICD-10-CM

## 2020-09-10 DIAGNOSIS — R600 Localized edema: Secondary | ICD-10-CM

## 2020-09-10 DIAGNOSIS — R262 Difficulty in walking, not elsewhere classified: Secondary | ICD-10-CM

## 2020-09-10 DIAGNOSIS — G8929 Other chronic pain: Secondary | ICD-10-CM

## 2020-09-10 DIAGNOSIS — M6281 Muscle weakness (generalized): Secondary | ICD-10-CM

## 2020-09-10 NOTE — Therapy (Signed)
[facility] [address] [place], [place], [address] Phone: [phone]   Fax:  [phone]  Physical Therapy Treatment  Patient Details  Name: [name] [name] MRN: [id number] Date of Birth: [birth year] Referring Provider (PT): Meredith Pel, MD   Encounter Date: 09/10/2020   PT End of Session - 09/10/20 1418     Visit Number 17    Number of Visits 22    Date for PT Re-Evaluation 09/25/20    Authorization Type [facility] Financial Assistance thru 10/02/20    Progress Note Due on Visit 20    PT Start Time 1422    PT Stop Time 1512    PT Time Calculation (min) 50 min    Activity Tolerance Patient tolerated treatment well    Behavior During Therapy [facility] for tasks assessed/performed             Past Medical History:  Diagnosis Date   Acute kidney injury ([place]) 03/02/2019   Acute respiratory failure with hypoxia ([place]) 02/22/2019   Atrial fibrillation with RVR ([place]) 02/26/2019   COVID-19 02/22/2019   Dysrhythmia    para. atrial fibrillation   Flu-like symptoms 02/22/2019   Hypertension    Knee osteoarthritis 10/24/2017   Pneumonia     Past Surgical History:  Procedure Laterality Date   NO PAST SURGERIES     TOTAL KNEE ARTHROPLASTY Right 12/03/2019   Procedure: RIGHT TOTAL KNEE ARTHROPLASTY;  Surgeon: Meredith Pel, MD;  Location: [place];  Service: Orthopedics;  Laterality: Right;   TOTAL KNEE ARTHROPLASTY Left 07/07/2020   Procedure: LEFT TOTAL KNEE ARTHROPLASTY-CEMENTED;  Surgeon: Meredith Pel, MD;  Location: [place];  Service: Orthopedics;  Laterality: Left;    There were no vitals filed for this visit.   Subjective Assessment - 09/10/20 1426     Subjective Pt. indicated she had pain with deep bending in Lt knee.  Pt. indicated she felt better with walking, so she came without assistive device.    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 Yes    Pain Score 6     Pain Location Knee    Pain Orientation Left    Pain Descriptors / Indicators Tightness    Pain Type Surgical pain    Pain Onset More than a month ago    Pain Frequency Intermittent    Aggravating Factors  deep bending    Pain Relieving Factors resting, not pushing as hard                [facility] PT Assessment - 09/10/20 0001       Observation/Other Assessments   Focus on Therapeutic Outcomes (FOTO)  update 49%                           [facility] Adult PT Treatment/Exercise - 09/10/20 0001       Ambulation/Gait   Gait Comments Independent ambulation to and from clinic, in clinic today      Neuro Re-ed    Neuro Re-ed Details  retro step on foam x 20 Lt posterior      Knee/Hip Exercises: Stretches   Knee: Self-Stretch to increase Flexion  5 reps;10 seconds;Left   overpressure from Rt leg     Knee/Hip Exercises: Aerobic   Recumbent Bike 7 mins full revolution      Knee/Hip Exercises: Machines for Strengthening   Cybex Knee Extension eccentric Lt leg 5 lbs 3 x 10    Total Gym Leg Press held due to searing skirt      Knee/Hip Exercises: Standing   Lateral Step Up 2 sets;10 reps;Left;Hand Hold: 1;Step Height: 4"      Knee/Hip Exercises: Seated   Other Seated Knee/Hip Exercises seated SLR 20 x Lt    Sit to Sand without UE support;20 reps   Lt leg slightly posterior to Rt, 18 inch table height     Vasopneumatic   Number Minutes Vasopneumatic  10 minutes    Vasopnuematic Location  Knee    Vasopneumatic Pressure Medium    Vasopneumatic Temperature  34      Manual Therapy   Manual therapy comments Lt knee mobilization c movement c flexion, distraction, IR c contralateral leg opposite                      PT  Short Term Goals - 08/13/20 1011       PT SHORT TERM GOAL #1   Title Pt will be I with initial HEP for knee AROM and strength    Time 1    Period Months    Status Achieved    Target Date 08/21/20      PT SHORT TERM GOAL #2   Title Pt will be able to perform SLR on left LE without assist for improved sit to supine    Time 1    Period Months    Status Achieved    Target Date 08/21/20      PT SHORT TERM GOAL #3   Title Pt will report less pain in left knee when sitting for improved comfort and rest at night    Time 1    Period Months    Status Achieved    Target Date 08/21/20               PT Long Term Goals - 09/03/20 1413       PT LONG TERM GOAL #1   Title Patient will demonstrate/report pain at worst less than or equal to 2/10 to facilitate minimal limitation in daily activity secondary to pain symptoms.    Baseline Still using Tramedol 3X/day, encouraged before bed with switch to tylenol during the day.    Time 10    Period Weeks    Status On-going    Target Date 09/25/20      PT LONG TERM GOAL #2   Title Patient will demonstrate independent use of home exercise program to facilitate ability to maintain/progress functional gains from skilled physical therapy services.    Time 10    Period Weeks    Status On-going    Target Date 09/25/20      PT LONG TERM GOAL #3   Title Patient will demonstrate independent ambulation community distances > 300 ft to facilitate community integration at [facility]    Baseline > 300 feet with a loftstrand crutch    Time 10    Period Weeks    Status Partially Met    Target Date 09/25/20      PT LONG TERM GOAL #4   Title Pt. will demonstrate Left knee AORM 0-100 deg to facilitate ability to perform transfers, walking, stairs at  PLOF s limitation.    Baseline Extension is great, flexion 96 AAROM 09/03/2020    Time 10    Period Weeks    Status Partially Met    Target Date 09/25/20      PT LONG TERM GOAL #5   Title Pt. will  demonstrate left knee MMT >/= 4/5 throughout to facilitate independent ambulation, stair navigation, transfers at PLOF.    Baseline Improving but quadriceps weakness is still present    Time 10    Period Weeks    Status On-going    Target Date 09/25/20                   Plan - 09/10/20 1451     Clinical Impression Statement Progression to include resisted machine eccentric LAQ was tolerated well.  Continued inclusion on flexion mobility gains c progressive WB strengthening and movement coordination improvements performed today and recommended to continue at this time.  Independent ambulation noted today.    Personal Factors and Comorbidities Age;Comorbidity 3+;Transportation    Comorbidities right TKA, DM2, A-Fib, HTN, OA, Covid-19    Examination-Activity Limitations Bed Mobility;Locomotion Level;Stairs;Stand;Transfers    Examination-Participation Restrictions Community Activity    Stability/Clinical Decision Making Stable/Uncomplicated    Rehab Potential Good    PT Frequency 3x / week   2x first week, 3x/wk for 2 weeks, then 2x/wk for 7 weeks   PT Duration 3 weeks   10 weeks   PT Treatment/Interventions ADLs/Self Care Home Management;Electrical Stimulation;Moist Heat;Cryotherapy;DME Instruction;Gait training;Stair training;Functional mobility training;Therapeutic activities;Therapeutic exercise;Balance training;Neuromuscular re-education;Patient/family education;Manual techniques;Scar mobilization;Passive range of motion;Vasopneumatic Device;Joint Manipulations    PT Next Visit Plan Continue progression of WB and machine strengthening, compliant surface balance.    PT Home Exercise Plan Access Code: [email or web address]    Consulted and Agree with Plan of Care Patient             Patient will benefit from skilled therapeutic intervention in order to improve the following deficits and impairments:  Abnormal gait, Decreased activity tolerance, Decreased balance, Decreased endurance,  Decreased coordination, Decreased knowledge of use of DME, Decreased mobility, Decreased range of motion, Decreased skin integrity, Decreased scar mobility, Decreased strength, Increased edema, Postural dysfunction, Obesity, Pain  Visit Diagnosis: Muscle weakness (generalized)  Difficulty in walking, not elsewhere classified  Localized edema  Chronic pain of left knee  Stiffness of left knee, not elsewhere classified  Unsteadiness on feet     Problem List Patient Active Problem List   Diagnosis Date Noted   Arthritis of left knee    S/P TKR (total knee replacement), 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 [place], [place], [address] Phone: [phone]   Fax:  [phone]  Name: Violett Hobbs MRN: [id number] Date of Birth: [birth year]

## 2020-09-11 ENCOUNTER — Ambulatory Visit (INDEPENDENT_AMBULATORY_CARE_PROVIDER_SITE_OTHER): Payer: Self-pay

## 2020-09-11 ENCOUNTER — Ambulatory Visit (INDEPENDENT_AMBULATORY_CARE_PROVIDER_SITE_OTHER): Payer: Self-pay | Admitting: Surgical

## 2020-09-11 ENCOUNTER — Encounter: Payer: Self-pay | Admitting: Surgical

## 2020-09-11 DIAGNOSIS — Z96651 Presence of right artificial knee joint: Secondary | ICD-10-CM

## 2020-09-11 DIAGNOSIS — M545 Low back pain, unspecified: Secondary | ICD-10-CM

## 2020-09-13 ENCOUNTER — Encounter: Payer: Self-pay | Admitting: Surgical

## 2020-09-13 NOTE — Progress Notes (Signed)
Post-Op Visit Note   Patient: [name] [name]           Date of Birth: [birth year]           MRN: [id number] Visit Date: 09/11/2020 PCP: Hoy Register, MD   Assessment & Plan:  Chief Complaint:  Chief Complaint  Patient presents with   Left Knee - Routine Post Op   Visit Diagnoses:  1. Status post total right knee replacement   2. Low back pain, unspecified back pain laterality, unspecified chronicity, unspecified whether sciatica present     Plan: Patient is a 70 year old female who presents s/p left total knee arthroplasty on 07/07/2020.  She reports 5/10 left knee pain with the knee feeling "heavy".  She is taking tramadol for pain control.  She is going to outpatient therapy 2 times per week which been helping and she feels she is continue to progress.  She does report radicular pain down the entirety of her leg to her foot on the left side.  She does have some low back pain as well.  This is been going on for several months even prior to surgery.  On exam she has range of motion from 0 to 95 degrees in the left knee with no knee effusion.  She has no pain with hip range of motion.  Excellent quadricep strength rated 5/5.  Incision is healing well without any evidence of infection or dehiscence.  No calf tenderness.  Negative Homans' sign.  She is able to perform multiple straight leg raises without difficulty.  She walks without any significant antalgic gait and her leg fully straightens throughout her gait.    She is concerned about the increased pain in her left knee compared with the right TKA she had last year.  She had knee radiographs taken today that demonstrate no change since prior radiographs and no evidence of loosening, periprosthetic complication.  Due to the radicular pain, she did have radiographs of her lumbar spine that revealed severe disc space narrowing at L3-L4 with spondylolisthesis at L4-L5 among other degenerative changes throughout the lumbar  spine.  Lumbar spine exam revealed tenderness throughout the lumbar spine and left-sided paraspinal musculature.  She has 5/5 motor strength of bilateral hip flexion, quadricep, hamstring, dorsiflexion, plantarflexion.  Positive straight leg raise with left leg, negative on right.  No evidence of clonus.   With continued radicular pain that has not improved, plan to have her start physical therapy of her lumbar spine while she is also pursuing therapy of her left knee.  Also ordered MRI of the lumbar spine for further evaluation of radicular pain has been going on for several months now this point.  Suspect that the radicular pain is more responsible for the knee pain she is experiencing, especially with such severe degenerative changes at L3-L4.  Follow-up after lumbar spine MRI to review results.  Follow-Up Instructions: No follow-ups on file.   Orders:  Orders Placed This Encounter  Procedures   XR Knee 1-2 Views Left   XR Lumbar Spine 2-3 Views   No orders of the defined types were placed in this encounter.   Imaging: No results found.  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 atrial fibrillation   Flu-like symptoms 02/22/2019   Hypertension    Knee osteoarthritis 10/24/2017   Pneumonia     Family History  Family  history unknown: Yes    Past Surgical History:  Procedure Laterality Date   NO PAST SURGERIES     TOTAL KNEE ARTHROPLASTY Right 12/03/2019   Procedure: RIGHT TOTAL KNEE ARTHROPLASTY;  Surgeon: Cammy Copa, MD;  Location: [facility] OR;  Service: Orthopedics;  Laterality: Right;   TOTAL KNEE ARTHROPLASTY Left 07/07/2020   Procedure: LEFT TOTAL KNEE ARTHROPLASTY-CEMENTED;  Surgeon: Cammy Copa, MD;  Location: [facility] OR;  Service: Orthopedics;  Laterality: Left;   Social History   Occupational History   Not on file  Tobacco Use   Smoking status: Never   Smokeless tobacco: Never  Vaping Use   Vaping Use: Never used  Substance and Sexual Activity   Alcohol use: Never   Drug use: Never   Sexual activity: Not Currently    Birth control/protection: Post-menopausal

## 2020-09-14 ENCOUNTER — Other Ambulatory Visit: Payer: Self-pay

## 2020-09-14 ENCOUNTER — Telehealth: Payer: Self-pay

## 2020-09-14 ENCOUNTER — Other Ambulatory Visit: Payer: Self-pay | Admitting: Surgical

## 2020-09-14 ENCOUNTER — Ambulatory Visit (INDEPENDENT_AMBULATORY_CARE_PROVIDER_SITE_OTHER): Payer: Self-pay | Admitting: Rehabilitative and Restorative Service Providers"

## 2020-09-14 ENCOUNTER — Encounter: Payer: Self-pay | Admitting: Rehabilitative and Restorative Service Providers"

## 2020-09-14 DIAGNOSIS — R600 Localized edema: Secondary | ICD-10-CM

## 2020-09-14 DIAGNOSIS — M25562 Pain in left knee: Secondary | ICD-10-CM

## 2020-09-14 DIAGNOSIS — M25662 Stiffness of left knee, not elsewhere classified: Secondary | ICD-10-CM

## 2020-09-14 DIAGNOSIS — G8929 Other chronic pain: Secondary | ICD-10-CM

## 2020-09-14 DIAGNOSIS — M6281 Muscle weakness (generalized): Secondary | ICD-10-CM

## 2020-09-14 DIAGNOSIS — R2681 Unsteadiness on feet: Secondary | ICD-10-CM

## 2020-09-14 DIAGNOSIS — R262 Difficulty in walking, not elsewhere classified: Secondary | ICD-10-CM

## 2020-09-14 MED ORDER — TRAMADOL HCL 50 MG PO TABS: 50.0000 mg | ORAL_TABLET | Freq: Two times a day (BID) | ORAL | 0 refills | Status: DC | PRN

## 2020-09-14 NOTE — Telephone Encounter (Signed)
Pt came into the office and would like a refill on her tramadol

## 2020-09-14 NOTE — Therapy (Signed)
[id number] [address] [place], [place], [address] Phone: [phone]   Fax:  [phone]  Physical Therapy Treatment  Patient Details  Name: [name] [name] MRN: [id number] Date of Birth: 01/02/1951 Referring Provider (PT): Meredith Pel, MD   Encounter Date: 09/14/2020   PT End of Session - 09/14/20 1107     Visit Number 18    Number of Visits 22    Date for PT Re-Evaluation 09/25/20    Authorization Type [facility] Financial Assistance thru 10/02/20    Progress Note Due on Visit 20    PT Start Time 1100    PT Stop Time 1150    PT Time Calculation (min) 50 min    Activity Tolerance Patient tolerated treatment well    Behavior During Therapy [facility] for tasks assessed/performed             Past Medical History:  Diagnosis Date   Acute kidney injury ([place]) 03/02/2019   Acute respiratory failure with hypoxia ([place]) 02/22/2019   Atrial fibrillation with RVR ([place]) 02/26/2019   COVID-19 02/22/2019   Dysrhythmia    para. atrial fibrillation   Flu-like symptoms 02/22/2019   Hypertension    Knee osteoarthritis 10/24/2017   Pneumonia     Past Surgical History:  Procedure Laterality Date   NO PAST SURGERIES     TOTAL KNEE ARTHROPLASTY Right 12/03/2019   Procedure: RIGHT TOTAL KNEE ARTHROPLASTY;  Surgeon: Meredith Pel, MD;  Location: [place];  Service: Orthopedics;  Laterality: Right;   TOTAL KNEE ARTHROPLASTY Left 07/07/2020   Procedure: LEFT TOTAL KNEE ARTHROPLASTY-CEMENTED;  Surgeon: Meredith Pel, MD;  Location: [place];  Service: Orthopedics;  Laterality: Left;    There were no vitals filed for this visit.   Subjective Assessment - 09/14/20 1106     Subjective Pt. reported 5/10 pain in Lt knee today.  Pt. saw MD office and indicated MRI to be scheduled/performed on back.    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 Yes    Pain Score 5     Pain Location Knee    Pain Orientation Left    Pain Descriptors / Indicators Tightness;Aching    Pain Type Surgical pain    Pain Onset More than a month ago    Pain Frequency Intermittent    Aggravating Factors  walking, end range    Pain Relieving Factors rest, light movement                               [facility] Adult PT Treatment/Exercise - 09/14/20 0001       Ambulation/Gait   Gait Comments Independent ambulation continued to and in clinic      Neuro Re-ed    Neuro Re-ed Details  tandem stance 1 min each way      Knee/Hip Exercises: Stretches   Gastroc Stretch 30 seconds;3 reps;Both   incline board     Knee/Hip Exercises: Aerobic   Recumbent Bike seat 6 lvl 2 full revolutions 7 mins      Knee/Hip Exercises: Machines for Strengthening   Cybex Knee Extension eccentric Lt leg 10 lbs 3  x 10    Total Gym Leg Press held due to wearing skirt      Knee/Hip Exercises: Standing   Lateral Step Up 2 sets;10 reps;Left;Hand Hold: 1;Step Height: 4"      Knee/Hip Exercises: Seated   Sit to Sand without UE support;15 reps   18 inch table height     Vasopneumatic   Number Minutes Vasopneumatic  10 minutes    Vasopnuematic Location  Knee   in elevation   Vasopneumatic Pressure High    Vasopneumatic Temperature  34      Manual Therapy   Manual therapy comments Lt knee mobilization c movement c flexion, distraction, IR c contralateral leg opposite                      PT Short Term Goals - 08/13/20 1011       PT SHORT TERM GOAL #1   Title Pt will be I with initial HEP for knee AROM and strength    Time 1    Period Months    Status Achieved    Target Date 08/21/20      PT SHORT TERM GOAL #2   Title Pt will be able to perform SLR on left LE  without assist for improved sit to supine    Time 1    Period Months    Status Achieved    Target Date 08/21/20      PT SHORT TERM GOAL #3   Title Pt will report less pain in left knee when sitting for improved comfort and rest at night    Time 1    Period Months    Status Achieved    Target Date 08/21/20               PT Long Term Goals - 09/03/20 1413       PT LONG TERM GOAL #1   Title Patient will demonstrate/report pain at worst less than or equal to 2/10 to facilitate minimal limitation in daily activity secondary to pain symptoms.    Baseline Still using Tramedol 3X/day, encouraged before bed with switch to tylenol during the day.    Time 10    Period Weeks    Status On-going    Target Date 09/25/20      PT LONG TERM GOAL #2   Title Patient will demonstrate independent use of home exercise program to facilitate ability to maintain/progress functional gains from skilled physical therapy services.    Time 10    Period Weeks    Status On-going    Target Date 09/25/20      PT LONG TERM GOAL #3   Title Patient will demonstrate independent ambulation community distances > 300 ft to facilitate community integration at [facility].    Baseline > 300 feet with a loftstrand crutch    Time 10    Period Weeks    Status Partially Met    Target Date 09/25/20      PT LONG TERM GOAL #4   Title Pt. will demonstrate Left knee AORM 0-100 deg to facilitate ability to perform transfers, walking, stairs at PLOF s limitation.    Baseline Extension is great, flexion 96 AAROM 09/03/2020    Time 10    Period Weeks    Status Partially Met    Target Date 09/25/20      PT LONG TERM GOAL #5   Title Pt. will demonstrate left knee MMT >/= 4/5 throughout to facilitate  independent ambulation, stair navigation, transfers at PLOF.    Baseline Improving but quadriceps weakness is still present    Time 10    Period Weeks    Status On-going    Target Date 09/25/20                    Plan - 09/14/20 1121     Clinical Impression Statement Reviewed order chart and no additional physical therapy referral indicated (i.e. lumbar).  Pt. to continue to benefit from strengthening and end range mobility gains on Lt leg to promote improved functional movement control on stairs, walking, etc.    Personal Factors and Comorbidities Age;Comorbidity 3+;Transportation    Comorbidities right TKA, DM2, A-Fib, HTN, OA, Covid-19    Examination-Activity Limitations Bed Mobility;Locomotion Level;Stairs;Stand;Transfers    Examination-Participation Restrictions Community Activity    Stability/Clinical Decision Making Stable/Uncomplicated    Rehab Potential Good    PT Frequency 3x / week   2x first week, 3x/wk for 2 weeks, then 2x/wk for 7 weeks   PT Duration 3 weeks   10 weeks   PT Treatment/Interventions ADLs/Self Care Home Management;Electrical Stimulation;Moist Heat;Cryotherapy;DME Instruction;Gait training;Stair training;Functional mobility training;Therapeutic activities;Therapeutic exercise;Balance training;Neuromuscular re-education;Patient/family education;Manual techniques;Scar mobilization;Passive range of motion;Vasopneumatic Device;Joint Manipulations    PT Next Visit Plan Continue progression of WB and machine strengthening, compliant surface balance/dynamic balance    PT Home Exercise Plan Access Code: [email or web address]    Consulted and Agree with Plan of Care Patient             Patient will benefit from skilled therapeutic intervention in order to improve the following deficits and impairments:  Abnormal gait, Decreased activity tolerance, Decreased balance, Decreased endurance, Decreased coordination, Decreased knowledge of use of DME, Decreased mobility, Decreased range of motion, Decreased skin integrity, Decreased scar mobility, Decreased strength, Increased edema, Postural dysfunction, Obesity, Pain  Visit Diagnosis: Muscle weakness (generalized)  Difficulty in walking, not  elsewhere classified  Localized edema  Chronic pain of left knee  Stiffness of left knee, not elsewhere classified  Unsteadiness on feet     Problem List Patient Active Problem List   Diagnosis Date Noted   Arthritis of left knee    S/P TKR (total knee replacement), 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

## 2020-09-14 NOTE — Telephone Encounter (Signed)
Sent in refill to [facility] on [address]

## 2020-09-14 NOTE — Telephone Encounter (Signed)
Please advise 

## 2020-09-15 NOTE — Telephone Encounter (Signed)
I left voicemail advising. ?

## 2020-09-16 ENCOUNTER — Other Ambulatory Visit: Payer: Self-pay

## 2020-09-16 DIAGNOSIS — M545 Low back pain, unspecified: Secondary | ICD-10-CM

## 2020-09-16 DIAGNOSIS — Z96651 Presence of right artificial knee joint: Secondary | ICD-10-CM

## 2020-09-17 ENCOUNTER — Ambulatory Visit (INDEPENDENT_AMBULATORY_CARE_PROVIDER_SITE_OTHER): Payer: Self-pay | Admitting: Rehabilitative and Restorative Service Providers"

## 2020-09-17 ENCOUNTER — Encounter: Payer: Self-pay | Admitting: Rehabilitative and Restorative Service Providers"

## 2020-09-17 ENCOUNTER — Other Ambulatory Visit: Payer: Self-pay

## 2020-09-17 DIAGNOSIS — G8929 Other chronic pain: Secondary | ICD-10-CM

## 2020-09-17 DIAGNOSIS — R2681 Unsteadiness on feet: Secondary | ICD-10-CM

## 2020-09-17 DIAGNOSIS — M545 Low back pain, unspecified: Secondary | ICD-10-CM

## 2020-09-17 DIAGNOSIS — M6281 Muscle weakness (generalized): Secondary | ICD-10-CM

## 2020-09-17 DIAGNOSIS — R600 Localized edema: Secondary | ICD-10-CM

## 2020-09-17 DIAGNOSIS — M25562 Pain in left knee: Secondary | ICD-10-CM

## 2020-09-17 DIAGNOSIS — M25662 Stiffness of left knee, not elsewhere classified: Secondary | ICD-10-CM

## 2020-09-17 DIAGNOSIS — R262 Difficulty in walking, not elsewhere classified: Secondary | ICD-10-CM

## 2020-09-17 NOTE — Patient Instructions (Signed)
Access Code: [email or web address] URL: [email or web address] Date: 09/17/2020 Prepared by: Chyrel Masson  Exercises Seated Knee Flexion AAROM - 3-5 x daily - 7 x weekly - 1 sets - 10 reps - 10 sec hold Supine Quadricep Sets - 2-3 x daily - 7 x weekly - 2-3 sets - 10 reps - 5 second hold Seated Straight Leg Heel Taps - 1-2 x daily - 7 x weekly - 3 sets - 10 reps Supine Heel Slide (Mirrored) - 2 x daily - 7 x weekly - 3 sets - 10 reps - 2 hold Sit to Stand - 1 x daily - 7 x weekly - 3 sets - 10 reps Supine Gluteal Sets - 2 x daily - 7 x weekly - 1 sets - 10 reps - 5 hold Standing Lumbar Extension - 2 x daily - 7 x weekly - 1 sets - 10 reps

## 2020-09-17 NOTE — Therapy (Signed)
[facility] [address] [place], [place], [address] Phone: [phone]   Fax:  [phone]  Physical Therapy Treatment/Recertification  Patient Details  Name: [name] LaineVeronique Kabika [name] MRN: [id number] Date of Birth: 04-16-1950 Referring Provider (PT): Dr. Edger Houseean, Charles Magnant PA-C   Encounter Date: 09/17/2020 Progress Note Reporting Period 08/13/2020  to 09/17/2020  See note below for Objective Data and Assessment of Progress/Goals.       PT End of Session - 09/17/20 1404     Visit Number 19    Number of Visits 30    Date for PT Re-Evaluation 10/29/20    Authorization Type [facility] Financial Assistance thru 10/02/20    PT Start Time 1427    PT Stop Time 1515    PT Time Calculation (min) 48 min    Activity Tolerance Patient tolerated treatment well    Behavior [facility] tasks assessed/performed             Past Medical History:  Diagnosis Date   Acute kidney injury ([facility]) 03/02/2019   Acute respiratory failure with hypoxia ([facility]) 02/22/2019   Atrial fibrillation with RVR ([facility]) 02/26/2019   COVID-19 02/22/2019   Dysrhythmia    para. atrial fibrillation   Flu-like symptoms 02/22/2019   Hypertension    Knee osteoarthritis 10/24/2017   Pneumonia     Past Surgical History:  Procedure Laterality Date   NO PAST SURGERIES     TOTAL KNEE ARTHROPLASTY Right 12/03/2019   Procedure: RIGHT TOTAL KNEE ARTHROPLASTY;  Surgeon: Cammy Copaean, Gregory Scott, MD;  [facility];  Service: Orthopedics;  Laterality: Right;   TOTAL KNEE ARTHROPLASTY Left 07/07/2020   Procedure: LEFT TOTAL KNEE ARTHROPLASTY-CEMENTED;  Surgeon: Cammy Copaean, Gregory Scott, MD;  [facility];  Service: Orthopedics;  Laterality: Left;    There were no vitals filed for this visit.   Subjective Assessment - 09/17/20 1402     Subjective Pt. received new referral following last week's MD office visit for adidtional complaints of low back pain.  Pt. indicated complaints of back  pain c trying to stand up straight and noted from transfers.  Pt. stated off and on trouble for years.  Pt. stated pain same during time of knee surgeries.    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 Yes    Pain Score 4     Pain Location Knee    Pain Orientation Left    Pain Descriptors / Indicators Aching;Tightness    Pain Type Surgical pain    Pain Onset More than a month ago    Pain Frequency Intermittent    Aggravating Factors  walking, pressure    Pain Relieving Factors resting    Multiple Pain Sites Yes    Pain Score 8   at worst   Pain Location Back    Pain Orientation Lower    Pain Descriptors / Indicators Tightness   hurting   Pain Type Chronic pain    Pain Radiating Towards Possibly into Lt thigh anterior/lateral    Pain Onset More than a month ago    Pain Frequency Intermittent    Aggravating Factors  standing up after sitting, leaning up, walking    Pain Relieving  Factors medicine, resting                [facility] PT Assessment - 09/17/20 0001       Assessment   Medical Diagnosis Lt TKA, LBP    Referring Provider (PT) Dr. Edger House PA-C    Onset Date/Surgical Date 07/07/20   Lt TKA surgery date, back pain for years     Balance Screen   Has the patient fallen in the past 6 months No    Has the patient had a decrease in activity level because of a fear of falling?  No    Is the patient reluctant to leave their home because of a fear of falling?  No      Home Environment   Living Environment Private residence    Living Arrangements Spouse/significant other    Type of Home House    Home Access Stairs to enter    Entrance Stairs-Number of Steps 4    Entrance  Stairs-Rails Right;Left;Cannot reach both    Home Layout Two level;Able to live on main level with bedroom/bathroom    Home Equipment Walker - 2 wheels;Cane - single point;Bedside commode    Additional Comments Had help with food, at home alone at night.  Stairs indicated for entry 5 stairs c rail      Prior Function   Level of Independence Independent;Independent with household mobility with device;Independent with community mobility with device      Posture/Postural Control   Posture Comments Mild reduction of lumbar lordosis in standing, equal height on iliac crest in standing      AROM   AROM Assessment Site Lumbar    Left Knee Extension 0    Left Knee Flexion 96    Lumbar Flexion movement to floor, pain noted upon return (painful arc)    Lumbar Extension 50 % WFL c back pain produced.  repeated 5x in standing improved to 75%      Strength   Left Knee Flexion 5/5    Left Knee Extension 5/5      Palpation   Palpation comment No specific tendenress to touch in lumbar region      Transfers   Comments Pain noted in back in standing from chair.  Able to perform from 18 inch chair s UE assist      Ambulation/Gait   Gait Comments Independent ambulation continued to and in clinic, mild reduction in stance phase on Lt                           [facility] Adult PT Treatment/Exercise - 09/17/20 0001       Knee/Hip Exercises: Stretches   Other Knee/Hip Stretches supine lumbar LTR 15 sec x 3 bilateral, standing lumbar ext x 10      Knee/Hip Exercises: Aerobic   Recumbent Bike seat 6 lvl 2 full revolution 6 mins      Knee/Hip Exercises: Machines for Strengthening   Cybex Knee Extension eccentric Lt leg 10 lbs 3 x 10      Knee/Hip Exercises: Supine   Other Supine Knee/Hip Exercises supine heel slide x 10 Lt    Other Supine Knee/Hip Exercises supine glute set 5 sec hold x 5                    PT Education - 09/17/20 1403     Education Details Low back  POC, HEP education    Person(s)  Educated Patient    Methods Explanation;Demonstration;Verbal cues;Handout    Comprehension Returned demonstration;Verbalized understanding              PT Short Term Goals - 09/17/20 1455       PT SHORT TERM GOAL #1   Title Pt will be I with initial HEP for knee AROM and strength    Time 3    Period Weeks    Status Revised    Target Date 10/08/20      PT SHORT TERM GOAL #2   Target Date 08/21/20               PT Long Term Goals - 09/17/20 1506       PT LONG TERM GOAL #1   Title Patient will demonstrate/report pain at worst less than or equal to 2/10 to facilitate minimal limitation in daily activity secondary to pain symptoms.    Time 6    Period Weeks    Status Revised    Target Date 10/29/20      PT LONG TERM GOAL #2   Title Patient will demonstrate independent use of home exercise program to facilitate ability to maintain/progress functional gains from skilled physical therapy services.    Time 6    Period Weeks    Status Revised    Target Date 10/29/20      PT LONG TERM GOAL #3   Title Patient will demonstrate independent ambulation community distances > 300 ft to facilitate community integration at [facility].    Baseline > 300 feet with a loftstrand crutch    Time 10    Period Weeks    Status Achieved      PT LONG TERM GOAL #4   Title Pt. will demonstrate Left knee AORM 0-100 deg to facilitate ability to perform transfers, walking, stairs at PLOF s limitation.    Baseline --    Time 6    Period Weeks    Status Revised    Target Date 10/29/20      PT LONG TERM GOAL #5   Title Pt. will demonstrate left knee MMT >/= 4/5 throughout to facilitate independent ambulation, stair navigation, transfers at PLOF.    Baseline Improving but quadriceps weakness is still present    Time 10    Period Weeks    Status Achieved      Additional Long Term Goals   Additional Long Term Goals Yes      PT LONG TERM GOAL #6   Title Pt.  will demonstrate lumbar ext 100% WFL s symptoms to facilitate upright standing/walking posture at PLOF s limitation.    Time 6    Period Weeks    Status New    Target Date 10/29/20                   Plan - 09/17/20 1407     Clinical Impression Statement Pt. has been seen prior to today for Lt TKA aftercare as previously documented.  Today, referral was provided for complaints of low back pain.  Pt. presented c complaints of low back pain and Lt knee pain c mobility, deficits primary at this time as impairing factors from full return to PLOF.  Continued skilled PT services indicated at this time to progress towards goals.    Personal Factors and Comorbidities Age;Comorbidity 3+;Transportation    Comorbidities right TKA, DM2, A-Fib, HTN, OA, Covid-19    Examination-Activity Limitations Bed Mobility;Locomotion Level;Stairs;Stand;Transfers    Examination-Participation Restrictions [employer]  Activity;Meal Prep    Stability/Clinical Decision Making Stable/Uncomplicated    Clinical Decision Making Low    Rehab Potential Good    PT Frequency 2x / week   2x first week, 3x/wk for 2 weeks, then 2x/wk for 7 weeks   PT Duration 6 weeks   10 weeks   PT Treatment/Interventions ADLs/Self Care Home Management;Electrical Stimulation;Moist Heat;Cryotherapy;DME Instruction;Gait training;Stair training;Functional mobility training;Therapeutic activities;Therapeutic exercise;Balance training;Neuromuscular re-education;Patient/family education;Manual techniques;Scar mobilization;Passive range of motion;Vasopneumatic Device;Joint Manipulations;Spinal Manipulations;Iontophoresis 4mg /ml Dexamethasone;Traction    PT Next Visit Plan Lumbar mobility gains, Lt knee flexion gains, static/dynamic balance improvements.    PT Home Exercise Plan Access Code:    Consulted and Agree with Plan of Care Patient             Patient will benefit from skilled therapeutic intervention in order to improve the  following deficits and impairments:  Abnormal gait, Decreased activity tolerance, Decreased balance, Decreased endurance, Decreased coordination, Decreased knowledge of use of DME, Decreased mobility, Decreased range of motion, Decreased skin integrity, Decreased scar mobility, Decreased strength, Increased edema, Postural dysfunction, Obesity, Pain, Hypomobility, Difficulty walking, Impaired perceived functional ability, Improper body mechanics, Impaired flexibility  Visit Diagnosis: Muscle weakness (generalized)  Difficulty in walking, not elsewhere classified  Localized edema  Chronic pain of left knee  Stiffness of left knee, not elsewhere classified  Unsteadiness on feet  Chronic low back pain, unspecified back pain laterality, unspecified whether sciatica present     Problem List Patient Active Problem List   Diagnosis Date Noted   Arthritis of left knee    S/P TKR (total knee replacement), 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 [place], [place], [place] Phone: [phone]   Fax:  [phone]  Name: [name] [name] MRN: [name] [name] Date of Birth: 1950-09-03

## 2020-09-18 IMAGING — CR LEFT ANKLE COMPLETE - 3+ VIEW
3 series · 3 of 3 positions shown · non-contrast
Comparison: None.

CLINICAL DATA: Pain

EXAM:
LEFT ANKLE COMPLETE - 3+ VIEW

[ankle ap]
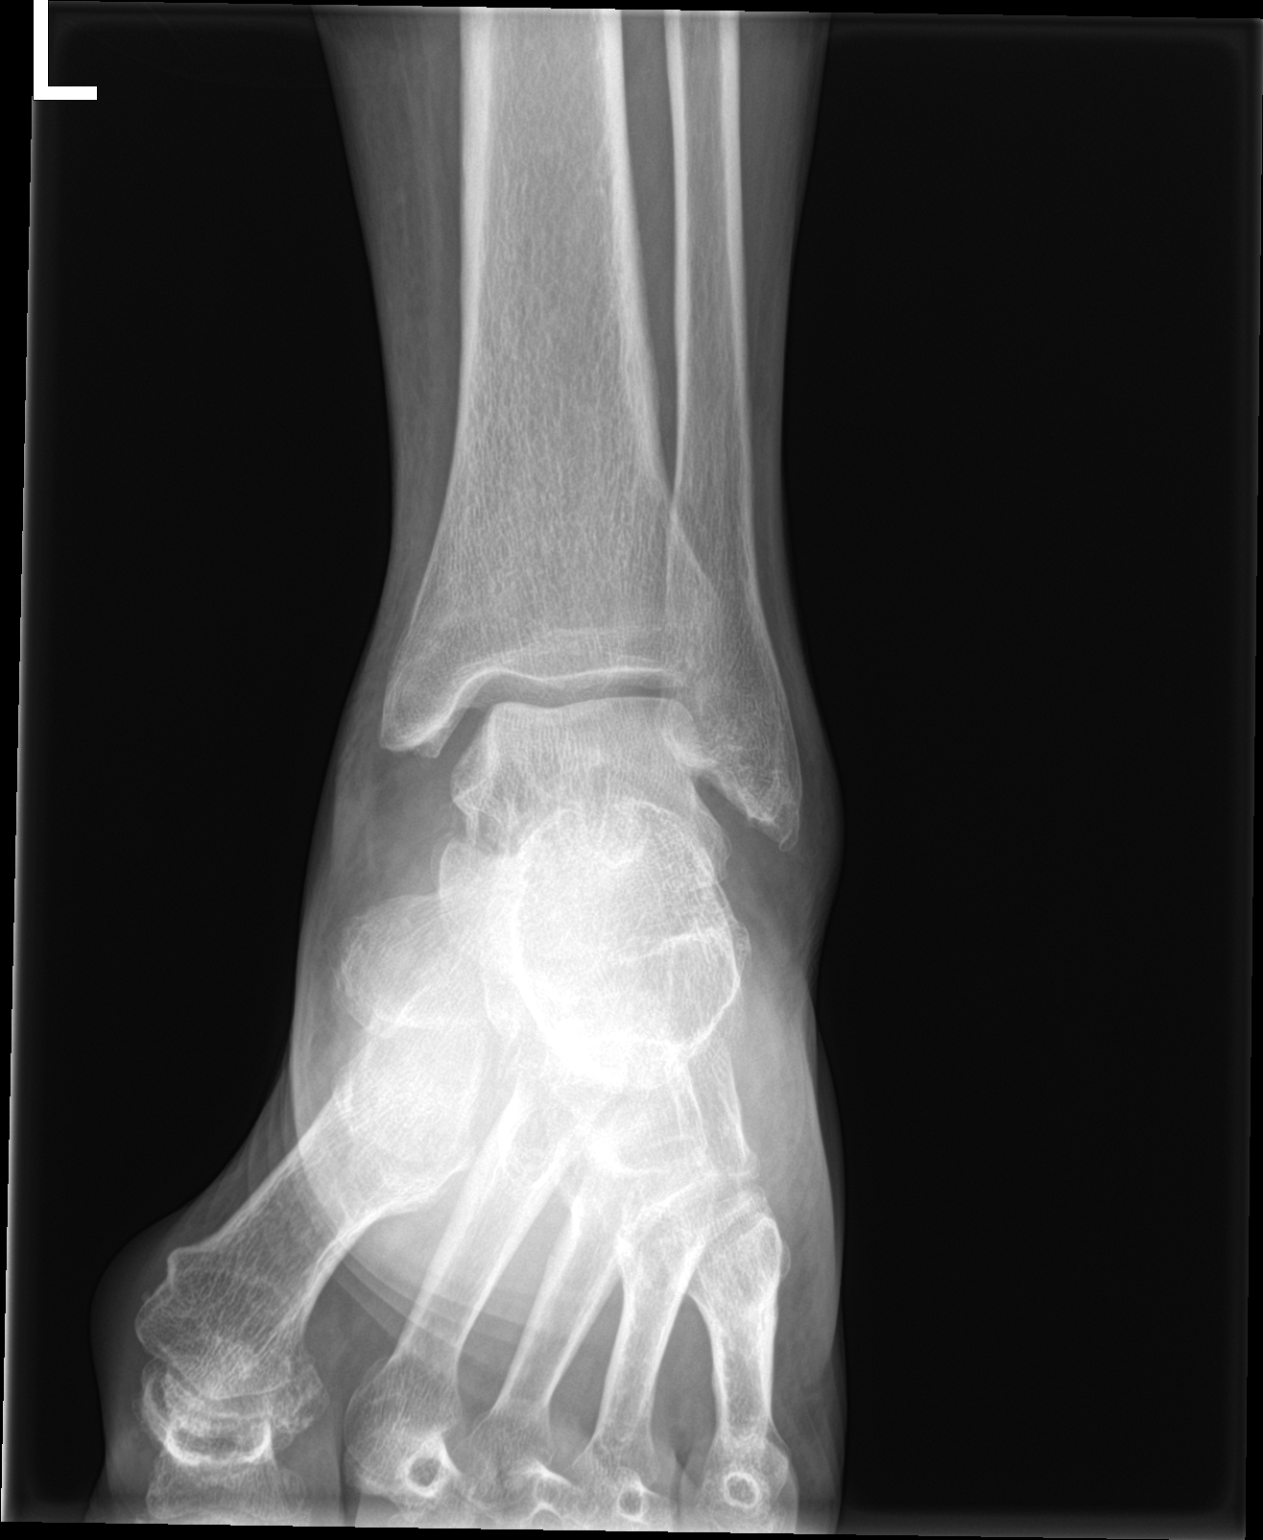

[ankle obl]
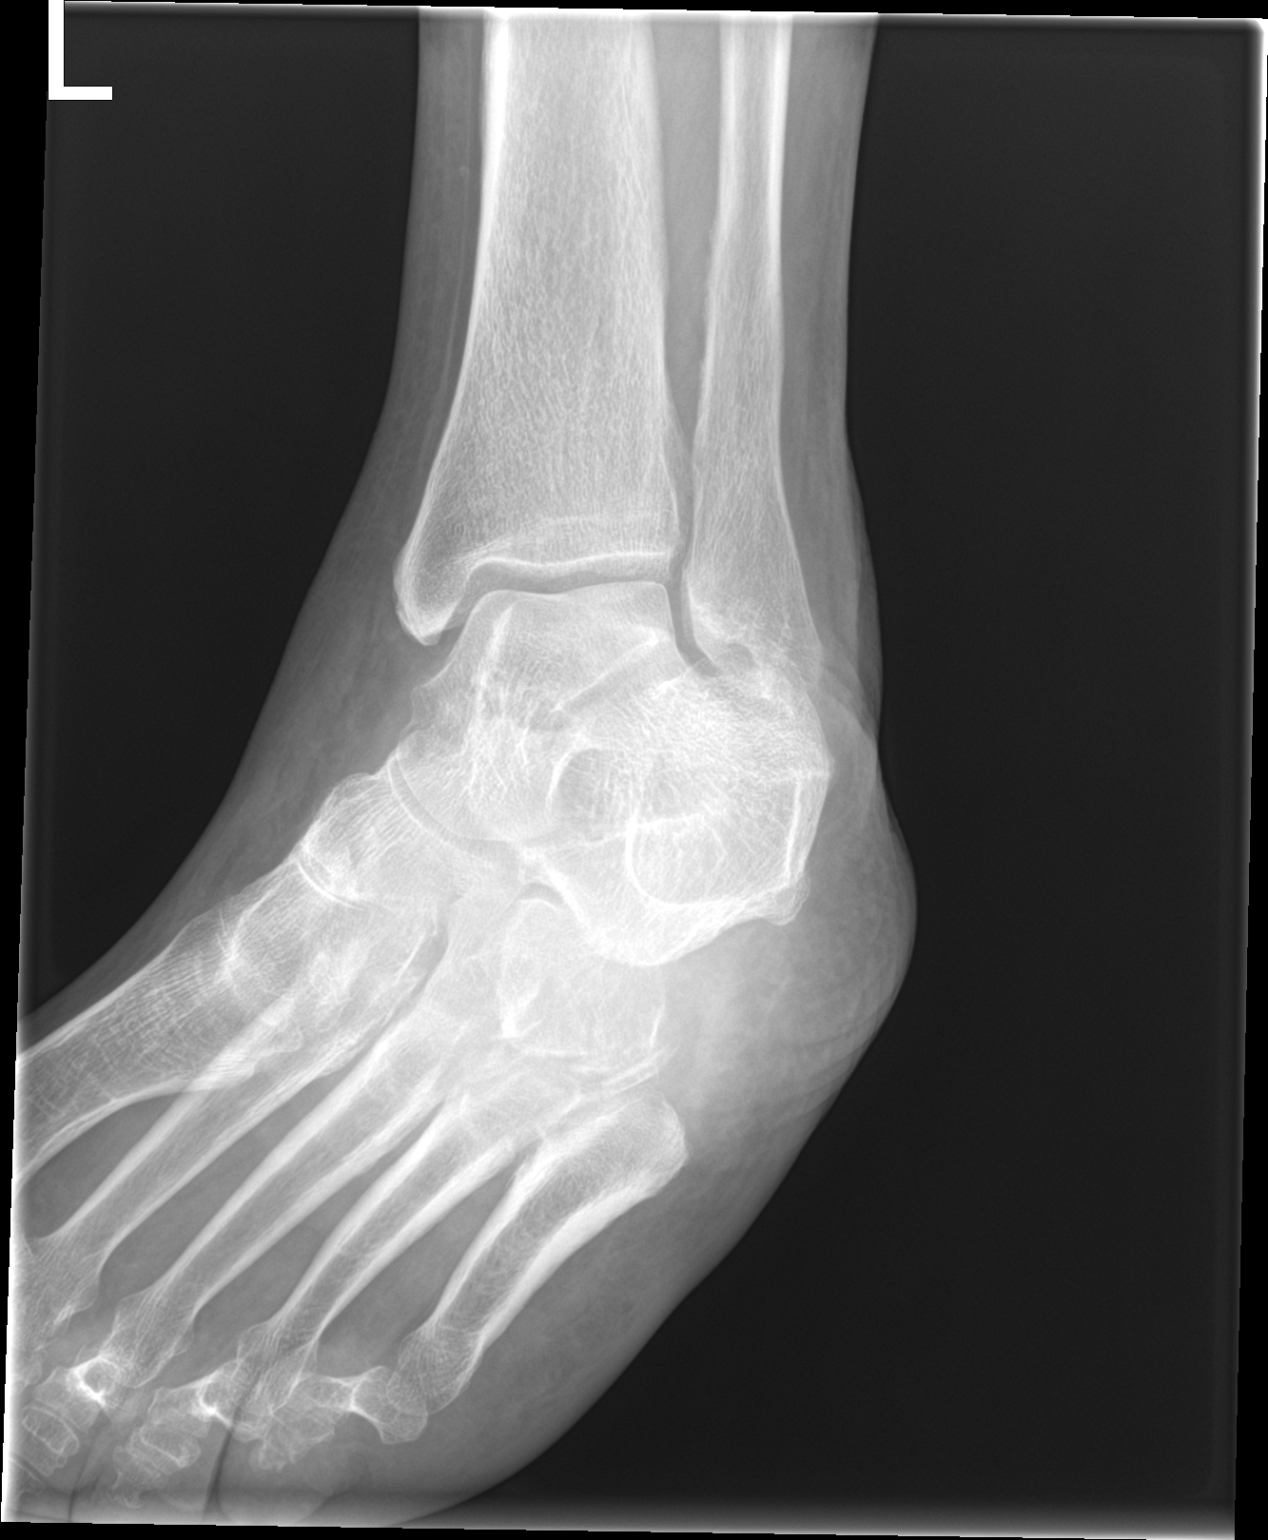

[ankle lat]
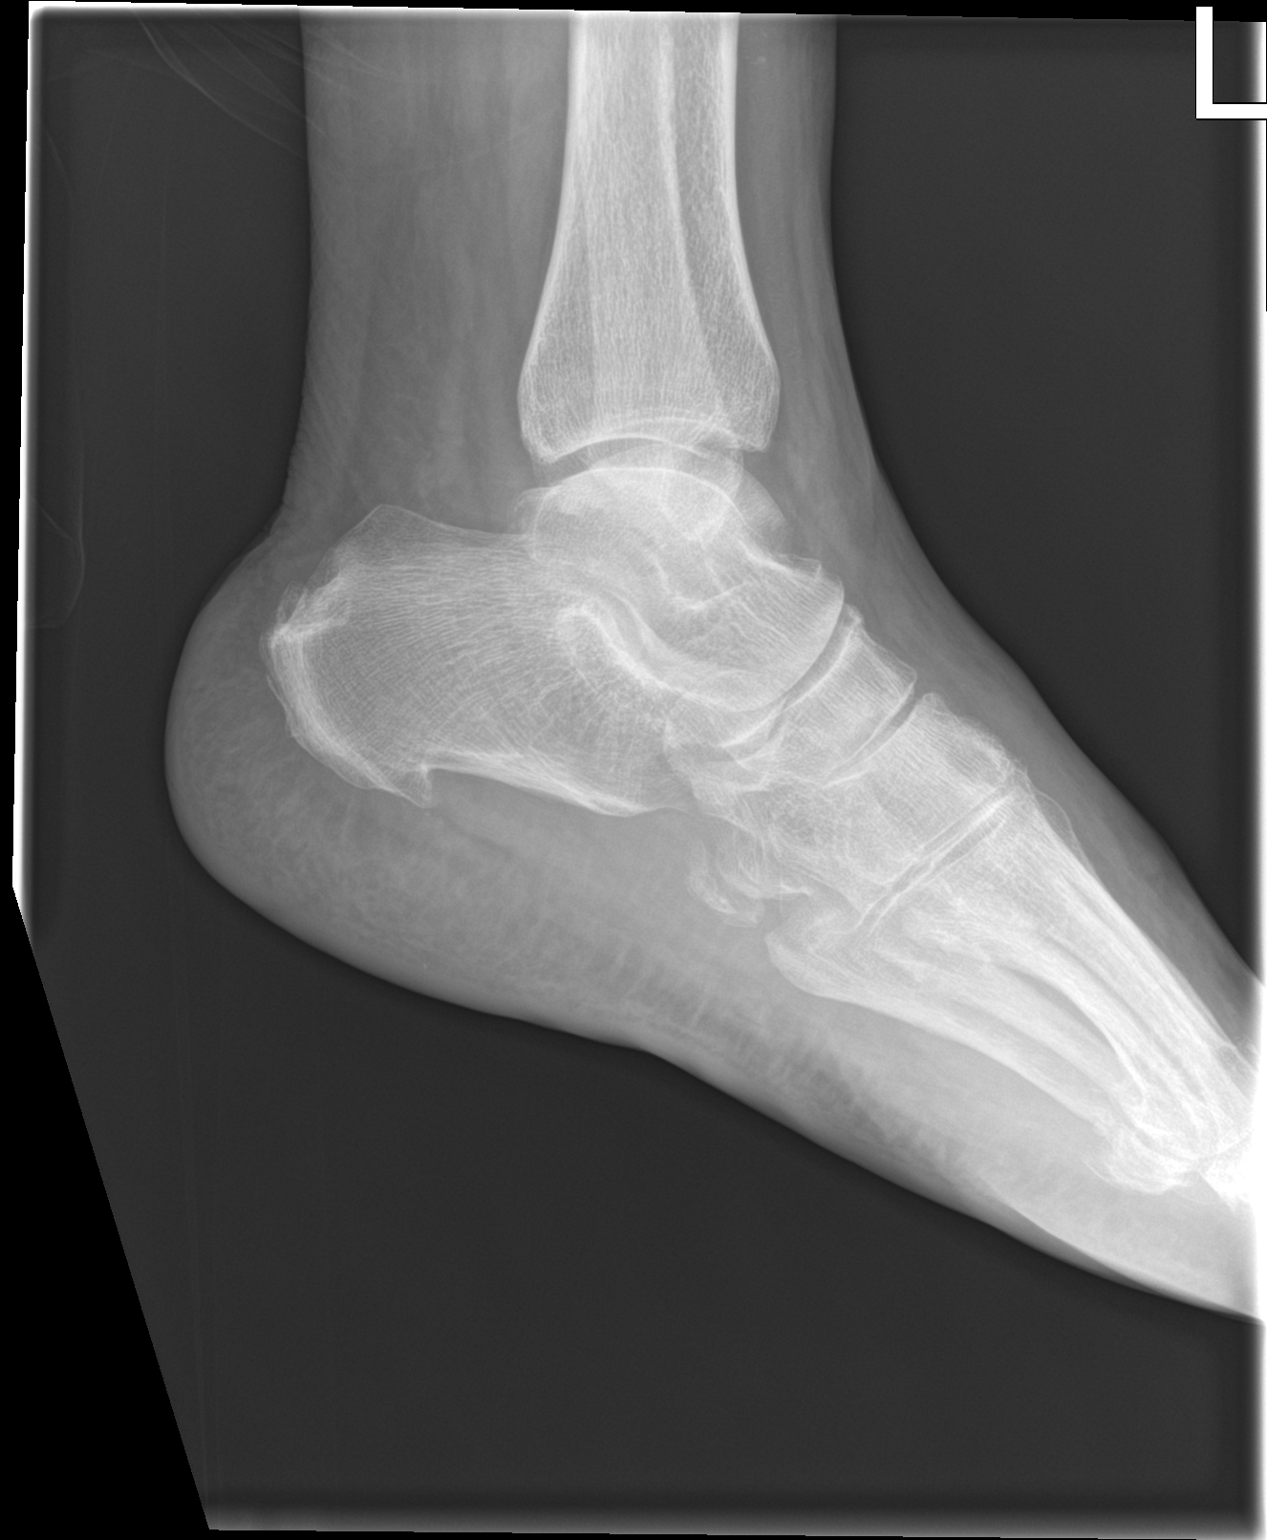

[3 of 3 positions shown; findings below may reference images not displayed]

FINDINGS: There is no acute displaced fracture or dislocation. There is mild
soft tissue swelling about the ankle. There are mild degenerative
changes of the mortise joint. There is a plantar calcaneal spur that
is small in size.
IMPRESSION: No acute osseous abnormality.

## 2020-09-18 IMAGING — DX PORTABLE CHEST - 1 VIEW
1 series · 1 of 1 positions shown · non-contrast
Comparison: None.

CLINICAL DATA: Irregular heart rate.  Foot pain.

EXAM:
PORTABLE CHEST 1 VIEW

[chest ap]
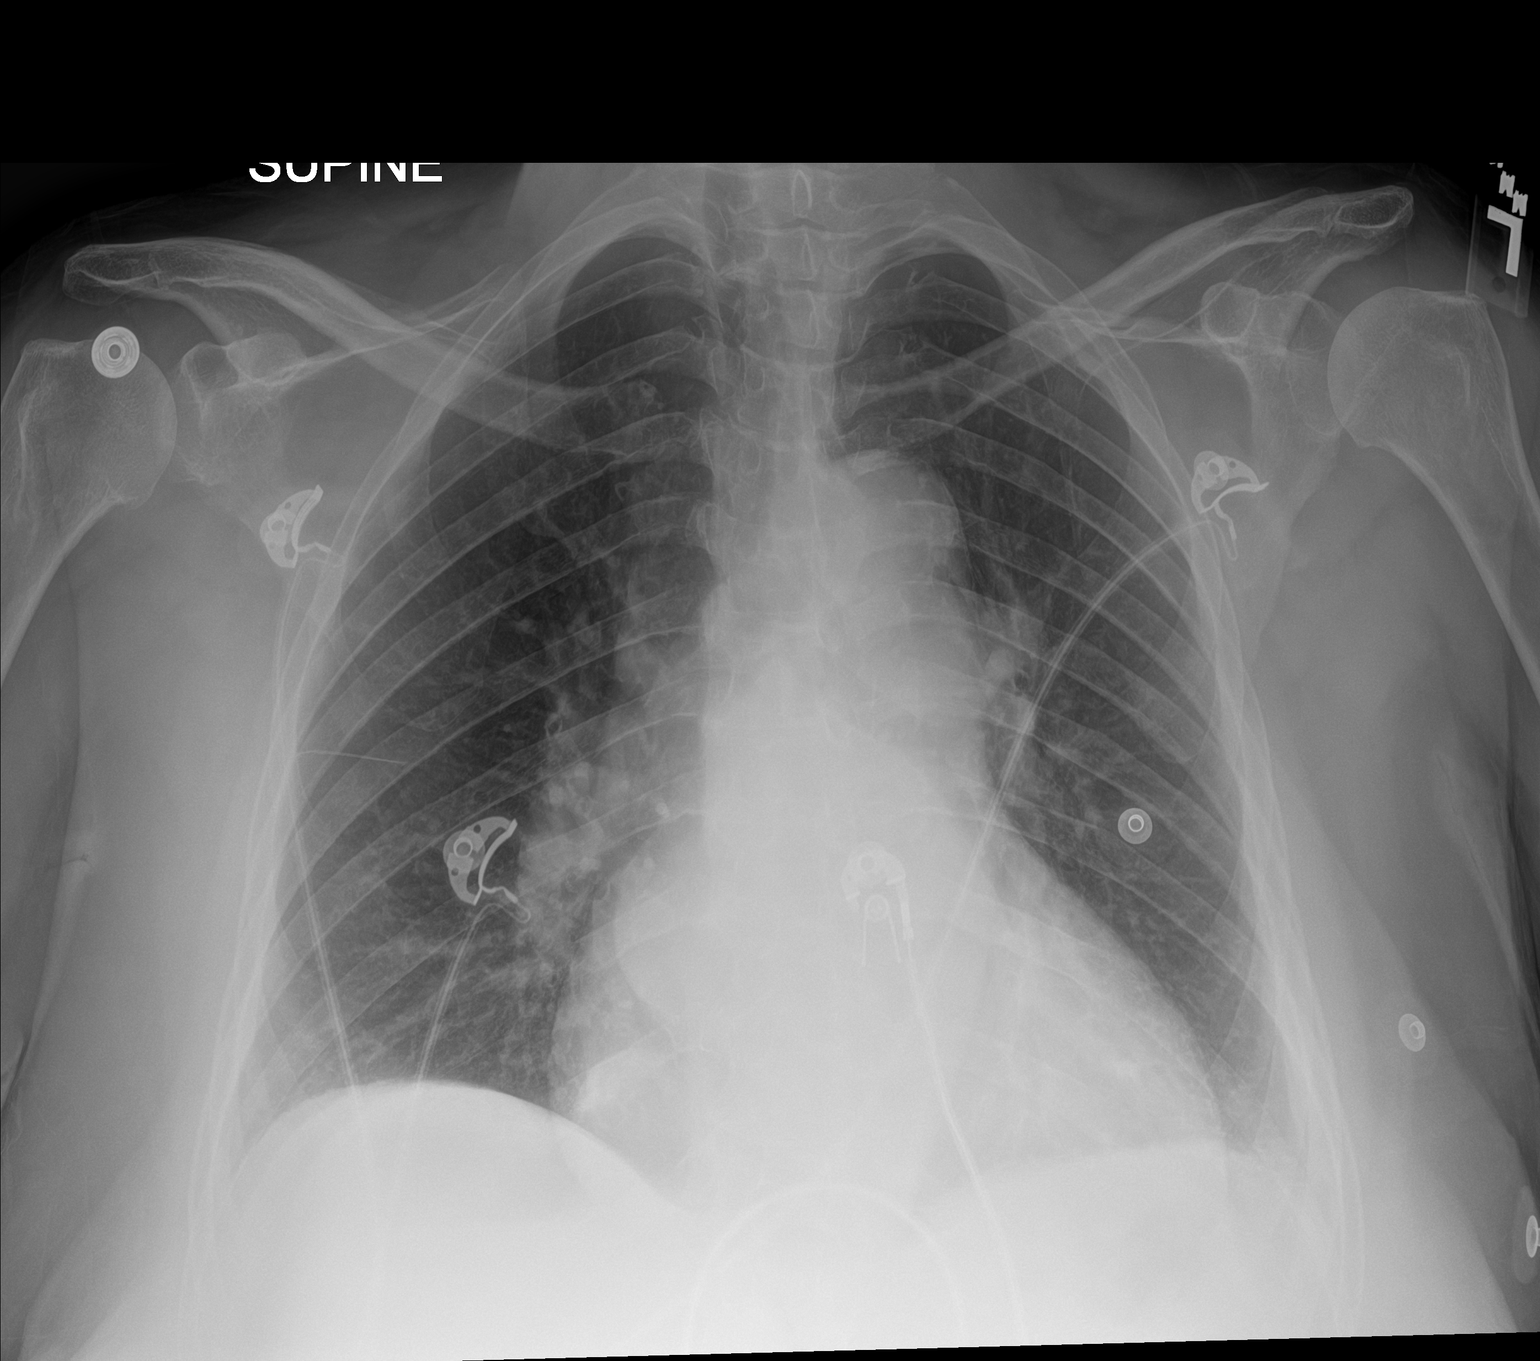

[1 of 1 positions shown; findings below may reference images not displayed]

FINDINGS: Double right heart border, consistent with left atrial enlargement.
Atherosclerotic calcification of the aortic arch. Normal pulmonary
vascularity. No focal consolidation, pleural effusion, or
pneumothorax. No acute osseous abnormality.
IMPRESSION: No active disease.

## 2020-09-21 ENCOUNTER — Other Ambulatory Visit: Payer: Self-pay

## 2020-09-21 ENCOUNTER — Ambulatory Visit (INDEPENDENT_AMBULATORY_CARE_PROVIDER_SITE_OTHER): Payer: Self-pay | Admitting: Physical Therapy

## 2020-09-21 ENCOUNTER — Encounter: Payer: Self-pay | Admitting: Physical Therapy

## 2020-09-21 DIAGNOSIS — M545 Low back pain, unspecified: Secondary | ICD-10-CM

## 2020-09-21 DIAGNOSIS — R600 Localized edema: Secondary | ICD-10-CM

## 2020-09-21 DIAGNOSIS — M25562 Pain in left knee: Secondary | ICD-10-CM

## 2020-09-21 DIAGNOSIS — M6281 Muscle weakness (generalized): Secondary | ICD-10-CM

## 2020-09-21 DIAGNOSIS — M25662 Stiffness of left knee, not elsewhere classified: Secondary | ICD-10-CM

## 2020-09-21 DIAGNOSIS — R262 Difficulty in walking, not elsewhere classified: Secondary | ICD-10-CM

## 2020-09-21 DIAGNOSIS — R2681 Unsteadiness on feet: Secondary | ICD-10-CM

## 2020-09-21 DIAGNOSIS — G8929 Other chronic pain: Secondary | ICD-10-CM

## 2020-09-21 NOTE — Therapy (Signed)
[facility] Physical Therapy [address] [place], [place], [address] Phone: [phone]   Fax:  [phone]  Physical Therapy Treatment  Patient Details  Name: [name] [name] MRN: [id number] Date of Birth: 1950/05/12 Referring Provider (PT): Dr. Edger House PA-C   Encounter Date: 09/21/2020   PT End of Session - 09/21/20 1157     Visit Number 20    Number of Visits 30    Date for PT Re-Evaluation 10/29/20    Authorization Type [facility] [employer] Assistance thru 10/02/20    Progress Note Due on Visit 20    PT Start Time 1145    PT Stop Time 1225    PT Time Calculation (min) 40 min    Activity Tolerance Patient tolerated treatment well    Behavior During Therapy [facility] for tasks assessed/performed             Past Medical History:  Diagnosis Date   Acute kidney injury (HCC) 03/02/2019   Acute respiratory failure with hypoxia (HCC) 02/22/2019   Atrial fibrillation with RVR (HCC) 02/26/2019   COVID-19 02/22/2019   Dysrhythmia    para. atrial fibrillation   Flu-like symptoms 02/22/2019   Hypertension    Knee osteoarthritis 10/24/2017   Pneumonia     Past Surgical History:  Procedure Laterality Date   NO PAST SURGERIES     TOTAL KNEE ARTHROPLASTY Right 12/03/2019   Procedure: RIGHT TOTAL KNEE ARTHROPLASTY;  Surgeon: Cammy Copa, MD;  Location: [facility] OR;  Service: Orthopedics;  Laterality: Right;   TOTAL KNEE ARTHROPLASTY Left 07/07/2020   Procedure: LEFT TOTAL KNEE ARTHROPLASTY-CEMENTED;  Surgeon: Cammy Copa, MD;  Location: [facility] OR;  Service: Orthopedics;  Laterality: Left;    There were no vitals filed for this visit.   Subjective Assessment - 09/21/20 1152     Subjective Pt arriving stating that the exercises the therapist gave her at the last visit have helped her low back. Pt did report left knee pain during the night. Pt also reporting pain after sitting prolonged when she first gets up.    Pertinent History right TKA, DM2,  A-Fib, HTN, OA, Covid-19    Limitations Lifting;Standing;Walking;House hold activities    How long can you sit comfortably? 30 minutes    How long can you stand comfortably? 10 minutes (was 5 minutes)    How long can you walk comfortably? 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 Yes    Pain Score 5     Pain Location Knee    Pain Orientation Left    Pain Descriptors / Indicators Aching    Pain Type Surgical pain    Pain Onset More than a month ago    Multiple Pain Sites Yes    Pain Score 3    Pain Location Back    Pain Orientation Lower    Pain Descriptors / Indicators Aching;Sore    Pain Type Chronic pain    Pain Onset More than a month ago                [facility] PT Assessment - 09/21/20 0001       Assessment   Medical Diagnosis Lt TKA, LBP    Referring Provider (PT) Dr. Edger House PA-C  Onset Date/Surgical Date 07/07/20   Lt TKA surgery date, back pain for years     AROM   Left Knee Extension 0    Left Knee Flexion 95                           [facility] Adult PT Treatment/Exercise - 09/21/20 0001       Knee/Hip Exercises: Stretches   Other Knee/Hip Stretches trunk rotation x 3 to each side, pt reporting more pain when rotating to the right side, no pain when rotating to left      Knee/Hip Exercises: Machines for Strengthening   Cybex Knee Extension eccentric Lt leg 10 lbs 3 x 10    Cybex Knee Flexion 15# 2x10 Lt LE only      Knee/Hip Exercises: Standing   Other Standing Knee Exercises hamstring curls x 10 each LE with single UE support, tandum stance with intermittent UE support    Other Standing Knee Exercises standing lumbar extension x 10 holding 5 seconds each      Knee/Hip Exercises: Seated   Sit to Sand 15 reps;without UE support       Knee/Hip Exercises: Supine   Bridges Strengthening;Both;10 reps    Bridges Limitations holding 5 seconds, hamstring cramps noted in right LE, pt perfomred extension stretch to relieve cramp    Other Supine Knee/Hip Exercises supine heel slide x 10 Lt    Other Supine Knee/Hip Exercises supine hamstring sets and glute sets x 10 holding 5 seconds                      PT Short Term Goals - 09/21/20 1204       PT SHORT TERM GOAL #1   Title Pt will be I with initial HEP for knee AROM and strength    Status On-going      PT SHORT TERM GOAL #2   Title Pt will be able to perform SLR on left LE without assist for improved sit to supine    Status Achieved      PT SHORT TERM GOAL #3   Title Pt will report less pain in left knee when sitting for improved comfort and rest at night    Status Achieved               PT Long Term Goals - 09/21/20 1204       PT LONG TERM GOAL #1   Title Patient will demonstrate/report pain at worst less than or equal to 2/10 to facilitate minimal limitation in daily activity secondary to pain symptoms.    Status On-going      PT LONG TERM GOAL #2   Title Patient will demonstrate independent use of home exercise program to facilitate ability to maintain/progress functional gains from skilled physical therapy services.    Status On-going      PT LONG TERM GOAL #3   Title Patient will demonstrate independent ambulation community distances > 300 ft to facilitate community integration at [facility].    Status On-going      PT LONG TERM GOAL #4   Title Pt. will demonstrate Left knee AORM 0-100 deg to facilitate ability to perform transfers, walking, stairs at PLOF s limitation.    Status On-going      PT LONG TERM GOAL #5   Title Pt. will demonstrate left knee MMT >/= 4/5 throughout to facilitate independent ambulation, stair navigation, transfers at PLOF.  Status Achieved      PT LONG TERM GOAL #6   Title Pt. will demonstrate lumbar ext 100% WFL  s symptoms to facilitate upright standing/walking posture at PLOF s limitation.    Status On-going                   Plan - 09/21/20 1158     Clinical Impression Statement Pt arriving today reporting her back pain has improved but still reporting pain in her back after prolonged sitting, Pt encouraged to contiue her HEP for her low back along with her left knee. Pt has 10 therapy visits left in her POC we will continue to incorporte lumbar stretching and core strengthening along with left knee strengthening and ROM.    Personal Factors and Comorbidities Age;Comorbidity 3+;Transportation    Comorbidities right TKA, DM2, A-Fib, HTN, OA, Covid-19    Examination-Activity Limitations Bed Mobility;Locomotion Level;Stairs;Stand;Transfers    Examination-Participation Restrictions Community Activity;Meal Prep    Stability/Clinical Decision Making Stable/Uncomplicated    Rehab Potential Good    PT Frequency 2x / week    PT Duration 6 weeks    PT Treatment/Interventions ADLs/Self Care Home Management;Electrical Stimulation;Moist Heat;Cryotherapy;DME Instruction;Gait training;Stair training;Functional mobility training;Therapeutic activities;Therapeutic exercise;Balance training;Neuromuscular re-education;Patient/family education;Manual techniques;Scar mobilization;Passive range of motion;Vasopneumatic Device;Joint Manipulations;Spinal Manipulations;Iontophoresis 4mg /ml Dexamethasone;Traction    PT Next Visit Plan Lumbar mobility gains, stretching and core strengthening, Lt knee flexion gains, static/dynamic balance improvements.    PT Home Exercise Plan Access Code:    Consulted and Agree with Plan of Care Patient             Patient will benefit from skilled therapeutic intervention in order to improve the following deficits and impairments:  Abnormal gait, Decreased activity tolerance, Decreased balance, Decreased endurance, Decreased coordination, Decreased knowledge of use of  DME, Decreased mobility, Decreased range of motion, Decreased skin integrity, Decreased scar mobility, Decreased strength, Increased edema, Postural dysfunction, Obesity, Pain, Hypomobility, Difficulty walking, Impaired perceived functional ability, Improper body mechanics, Impaired flexibility  Visit Diagnosis: Muscle weakness (generalized)  Difficulty in walking, not elsewhere classified  Localized edema  Chronic pain of left knee  Stiffness of left knee, not elsewhere classified  Unsteadiness on feet  Chronic low back pain, unspecified back pain laterality, unspecified whether sciatica present     Problem List Patient Active Problem List   Diagnosis Date Noted   Arthritis of left knee    S/P TKR (total knee replacement), 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   Conjunctival hemorrhage of right eye 03/25/2020   Chronic anticoagulation 03/25/2020   S/P total knee arthroplasty 12/04/2019   S/P knee surgery 12/03/2019   Type 2 diabetes mellitus with obesity (HCC) 07/22/2019   Chronic atrial fibrillation (HCC) 02/26/2019   Essential hypertension, benign 05/03/2018   Knee osteoarthritis 10/24/2017    10/26/2017, PT, MPT 09/21/2020, 12:33 PM  [facility] Physical Therapy [address] [place], [place], [place] Phone: [phone]   Fax:  [phone]  Name: [name] [name] MRN: Avelina Laine Date of Birth: 1950-11-07

## 2020-09-23 ENCOUNTER — Encounter: Payer: Self-pay | Admitting: Physical Therapy

## 2020-09-23 ENCOUNTER — Ambulatory Visit (INDEPENDENT_AMBULATORY_CARE_PROVIDER_SITE_OTHER): Payer: Self-pay | Admitting: Physical Therapy

## 2020-09-23 ENCOUNTER — Other Ambulatory Visit: Payer: Self-pay

## 2020-09-23 DIAGNOSIS — M6281 Muscle weakness (generalized): Secondary | ICD-10-CM

## 2020-09-23 DIAGNOSIS — M545 Low back pain, unspecified: Secondary | ICD-10-CM

## 2020-09-23 DIAGNOSIS — M25662 Stiffness of left knee, not elsewhere classified: Secondary | ICD-10-CM

## 2020-09-23 DIAGNOSIS — R262 Difficulty in walking, not elsewhere classified: Secondary | ICD-10-CM

## 2020-09-23 DIAGNOSIS — R2681 Unsteadiness on feet: Secondary | ICD-10-CM

## 2020-09-23 DIAGNOSIS — R600 Localized edema: Secondary | ICD-10-CM

## 2020-09-23 DIAGNOSIS — G8929 Other chronic pain: Secondary | ICD-10-CM

## 2020-09-23 DIAGNOSIS — M25562 Pain in left knee: Secondary | ICD-10-CM

## 2020-09-23 NOTE — Therapy (Signed)
[facility] Physical Therapy [address] [place], [place], [address] Phone: [phone]   Fax:  [phone]  Physical Therapy Treatment  Patient Details  Name: [name] [name] MRN: [id number] Date of Birth: 1950-04-05 Referring Provider (PT): Dr. Edger House PA-C   Encounter Date: 09/23/2020   PT End of Session - 09/23/20 1058     Visit Number 21    Number of Visits 30    Date for PT Re-Evaluation 10/29/20    Authorization Type [facility] Financial Assistance thru 10/02/20    Progress Note Due on Visit 20    PT Start Time 1058    PT Stop Time 1145    PT Time Calculation (min) 47 min    Activity Tolerance Patient tolerated treatment well    Behavior During Therapy [facility] for tasks assessed/performed             Past Medical History:  Diagnosis Date   Acute kidney injury (HCC) 03/02/2019   Acute respiratory failure with hypoxia (HCC) 02/22/2019   Atrial fibrillation with RVR (HCC) 02/26/2019   COVID-19 02/22/2019   Dysrhythmia    para. atrial fibrillation   Flu-like symptoms 02/22/2019   Hypertension    Knee osteoarthritis 10/24/2017   Pneumonia     Past Surgical History:  Procedure Laterality Date   NO PAST SURGERIES     TOTAL KNEE ARTHROPLASTY Right 12/03/2019   Procedure: RIGHT TOTAL KNEE ARTHROPLASTY;  Surgeon: Cammy Copa, MD;  Location: [facility] OR;  Service: Orthopedics;  Laterality: Right;   TOTAL KNEE ARTHROPLASTY Left 07/07/2020   Procedure: LEFT TOTAL KNEE ARTHROPLASTY-CEMENTED;  Surgeon: Cammy Copa, MD;  Location: [facility] OR;  Service: Orthopedics;  Laterality: Left;    There were no vitals filed for this visit.   Subjective Assessment - 09/23/20 1059     Subjective Her back is doing better.  with the exercises.    Pertinent History right TKA, DM2, A-Fib, HTN, OA, Covid-19    Limitations Lifting;Standing;Walking;House hold activities    How long can you sit comfortably? 30 minutes    How long can you stand comfortably?  10 minutes (was 5 minutes)    How long can you walk comfortably? 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 Yes    Pain Score 5    since last PT session, lowest 0/10   Pain Location Back    Pain Orientation Mid;Posterior    Pain Descriptors / Indicators Aching    Pain Type Chronic pain    Pain Onset More than a month ago    Pain Frequency Intermittent    Multiple Pain Sites Yes    Pain Score 3   before medication 6/10   Pain Location Knee    Pain Orientation Left    Pain Descriptors / Indicators Sore    Pain Type Acute pain    Pain Onset More than a month ago    Pain Frequency Intermittent    Aggravating Factors  standing up after sitting awhile    Pain Relieving Factors medicine, rest                               [facility] Adult PT Treatment/Exercise - 09/23/20 1059  Knee/Hip Exercises: Stretches   Other Knee/Hip Stretches trunk rotation 15 sec hold x 3 to each side, pt reporting more pain when rotating to the right side, no pain when rotating to left      Knee/Hip Exercises: Aerobic   Recumbent Bike seat 6 lvl 2 full revolution 6 mins      Knee/Hip Exercises: Machines for Strengthening   Cybex Knee Extension eccentric Lt leg 10 lbs 3 x 10    Cybex Knee Flexion 15# 2x10 Lt LE only      Knee/Hip Exercises: Standing   Heel Raises Both;10 reps;5 seconds    Heel Raises Limitations without UE assist    Other Standing Knee Exercises hamstring curls x 10 each LE with single UE support, tandum stance with intermittent UE support    Other Standing Knee Exercises standing lumbar extension x 10 holding 5 seconds each with counter behind her for staiblity & increased range.      Knee/Hip Exercises: Seated   Sit to Sand 15 reps;without UE support      Knee/Hip Exercises:  Supine   Bridges Strengthening;Both;10 reps    Bridges Limitations holding 5 seconds, hamstring cramps noted in right LE, pt perfomred extension stretch to relieve cramp    Other Supine Knee/Hip Exercises supine heel slide x 10 Lt    Other Supine Knee/Hip Exercises supine hamstring sets and glute sets x 10 holding 5 seconds                    PT Education - 09/23/20 1146     Education Details weight loss & supportive shoes with some cushion can help with arthritic pain    Person(s) Educated Patient    Methods Explanation;Verbal cues    Comprehension Verbalized understanding              PT Short Term Goals - 09/21/20 1204       PT SHORT TERM GOAL #1   Title Pt will be I with initial HEP for knee AROM and strength    Status On-going      PT SHORT TERM GOAL #2   Title Pt will be able to perform SLR on left LE without assist for improved sit to supine    Status Achieved      PT SHORT TERM GOAL #3   Title Pt will report less pain in left knee when sitting for improved comfort and rest at night    Status Achieved               PT Long Term Goals - 09/21/20 1204       PT LONG TERM GOAL #1   Title Patient will demonstrate/report pain at worst less than or equal to 2/10 to facilitate minimal limitation in daily activity secondary to pain symptoms.    Status On-going      PT LONG TERM GOAL #2   Title Patient will demonstrate independent use of home exercise program to facilitate ability to maintain/progress functional gains from skilled physical therapy services.    Status On-going      PT LONG TERM GOAL #3   Title Patient will demonstrate independent ambulation community distances > 300 ft to facilitate community integration at [facility].    Status On-going      PT LONG TERM GOAL #4   Title Pt. will demonstrate Left knee AORM 0-100 deg to facilitate ability to perform transfers, walking, stairs at PLOF s limitation.    Status On-going  PT LONG TERM GOAL  #5   Title Pt. will demonstrate left knee MMT >/= 4/5 throughout to facilitate independent ambulation, stair navigation, transfers at PLOF.    Status Achieved      PT LONG TERM GOAL #6   Title Pt. will demonstrate lumbar ext 100% WFL s symptoms to facilitate upright standing/walking posture at PLOF s limitation.    Status On-going                   Plan - 09/23/20 1058     Clinical Impression Statement Patient reports exercises improve her knee & back pain. However she continues to need reminders to do exercises regularly.    Personal Factors and Comorbidities Age;Comorbidity 3+;Transportation    Comorbidities right TKA, DM2, A-Fib, HTN, OA, Covid-19    Examination-Activity Limitations Bed Mobility;Locomotion Level;Stairs;Stand;Transfers    Examination-Participation Restrictions Community Activity;Meal Prep    Stability/Clinical Decision Making Stable/Uncomplicated    Rehab Potential Good    PT Frequency 2x / week    PT Duration 6 weeks    PT Treatment/Interventions ADLs/Self Care Home Management;Electrical Stimulation;Moist Heat;Cryotherapy;DME Instruction;Gait training;Stair training;Functional mobility training;Therapeutic activities;Therapeutic exercise;Balance training;Neuromuscular re-education;Patient/family education;Manual techniques;Scar mobilization;Passive range of motion;Vasopneumatic Device;Joint Manipulations;Spinal Manipulations;Iontophoresis 4mg /ml Dexamethasone;Traction    PT Next Visit Plan Lumbar mobility gains, stretching and core strengthening, Lt knee flexion gains, static/dynamic balance improvements.    PT Home Exercise Plan Access Code:    Consulted and Agree with Plan of Care Patient             Patient will benefit from skilled therapeutic intervention in order to improve the following deficits and impairments:  Abnormal gait, Decreased activity tolerance, Decreased balance, Decreased endurance, Decreased coordination, Decreased knowledge  of use of DME, Decreased mobility, Decreased range of motion, Decreased skin integrity, Decreased scar mobility, Decreased strength, Increased edema, Postural dysfunction, Obesity, Pain, Hypomobility, Difficulty walking, Impaired perceived functional ability, Improper body mechanics, Impaired flexibility  Visit Diagnosis: Muscle weakness (generalized)  Difficulty in walking, not elsewhere classified  Localized edema  Stiffness of left knee, not elsewhere classified  Chronic pain of left knee  Unsteadiness on feet  Chronic low back pain, unspecified back pain laterality, unspecified whether sciatica present     Problem List Patient Active Problem List   Diagnosis Date Noted   Arthritis of left knee    S/P TKR (total knee replacement), 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 [place], [place], [place] Phone: [phone]   Fax:  [phone]  Name: Donya Hitch MRN: Avelina Laine Date of Birth: Jun 15, 1950

## 2020-09-25 ENCOUNTER — Other Ambulatory Visit: Payer: Self-pay

## 2020-09-25 ENCOUNTER — Other Ambulatory Visit: Payer: Self-pay | Admitting: Family

## 2020-09-25 DIAGNOSIS — I482 Chronic atrial fibrillation, unspecified: Secondary | ICD-10-CM

## 2020-09-25 MED ORDER — AMLODIPINE BESYLATE 5 MG PO TABS
5.0000 mg | ORAL_TABLET | Freq: Every day | ORAL | 0 refills | Status: DC
  Filled 2020-09-25: qty 90, 90d supply, fill #0

## 2020-09-30 ENCOUNTER — Telehealth: Payer: Self-pay | Admitting: Pediatric Intensive Care

## 2020-09-30 ENCOUNTER — Other Ambulatory Visit: Payer: Self-pay

## 2020-09-30 ENCOUNTER — Encounter: Payer: Self-pay | Admitting: Rehabilitative and Restorative Service Providers"

## 2020-09-30 ENCOUNTER — Ambulatory Visit (INDEPENDENT_AMBULATORY_CARE_PROVIDER_SITE_OTHER): Payer: Self-pay | Admitting: Rehabilitative and Restorative Service Providers"

## 2020-09-30 DIAGNOSIS — M6281 Muscle weakness (generalized): Secondary | ICD-10-CM

## 2020-09-30 DIAGNOSIS — G8929 Other chronic pain: Secondary | ICD-10-CM

## 2020-09-30 DIAGNOSIS — R262 Difficulty in walking, not elsewhere classified: Secondary | ICD-10-CM

## 2020-09-30 DIAGNOSIS — M25562 Pain in left knee: Secondary | ICD-10-CM

## 2020-09-30 DIAGNOSIS — M25662 Stiffness of left knee, not elsewhere classified: Secondary | ICD-10-CM

## 2020-09-30 DIAGNOSIS — M545 Low back pain, unspecified: Secondary | ICD-10-CM

## 2020-09-30 NOTE — Telephone Encounter (Signed)
Attempted call to client- unable to leave voice message. Shann Medal RN BSN CNP [phone]

## 2020-09-30 NOTE — Therapy (Signed)
[facility] Physical Therapy [address] [place], [place], [address] Phone: [phone]   Fax:  [phone]  Physical Therapy Treatment  Patient Details  Name: [name] [name] MRN: [id number] Date of Birth: [birth year] Referring Provider (PT): Dr. Edger House PA-C   Encounter Date: 09/30/2020   PT End of Session - 09/30/20 1623     Visit Number 22    Number of Visits 30    Date for PT Re-Evaluation 10/29/20    Authorization Type [facility] Assistance thru 10/02/20    Progress Note Due on Visit 20    PT Start Time 1515    PT Stop Time 1600    PT Time Calculation (min) 45 min    Activity Tolerance Patient tolerated treatment well;No increased pain    Behavior During Therapy [facility] for tasks assessed/performed             Past Medical History:  Diagnosis Date   Acute kidney injury (HCC) 03/02/2019   Acute respiratory failure with hypoxia (HCC) 02/22/2019   Atrial fibrillation with RVR (HCC) 02/26/2019   COVID-19 02/22/2019   Dysrhythmia    para. atrial fibrillation   Flu-like symptoms 02/22/2019   Hypertension    Knee osteoarthritis 10/24/2017   Pneumonia     Past Surgical History:  Procedure Laterality Date   NO PAST SURGERIES     TOTAL KNEE ARTHROPLASTY Right 12/03/2019   Procedure: RIGHT TOTAL KNEE ARTHROPLASTY;  Surgeon: Cammy Copa, MD;  Location: [facility] OR;  Service: Orthopedics;  Laterality: Right;   TOTAL KNEE ARTHROPLASTY Left 07/07/2020   Procedure: LEFT TOTAL KNEE ARTHROPLASTY-CEMENTED;  Surgeon: Cammy Copa, MD;  Location: [facility] OR;  Service: Orthopedics;  Laterality: Left;    There were no vitals filed for this visit.   Subjective Assessment - 09/30/20 1615     Subjective [name] [name] is very happy with her progress.  Her knee is progressing and her back is less functionally limiting.    Pertinent History right TKA, DM2, A-Fib, HTN, OA, Covid-19    Limitations Lifting;Standing;Walking;House hold activities     How long can you sit comfortably? 30 minutes    How long can you stand comfortably? 10 minutes (was 5 minutes)    How long can you walk comfortably? 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 Yes    Pain Score 5     Pain Location Back    Pain Orientation Mid;Lower    Pain Descriptors / Indicators Aching;Sore    Pain Type Chronic pain    Pain Radiating Towards NA    Pain Onset More than a month ago    Pain Frequency Intermittent    Aggravating Factors  Sit to stand in the morning and with flexed activities (cooking and sweeping)    Pain Relieving Factors Change of position and exercises    Effect of Pain on Daily Activities Limits full participation with house chores and walking more than household distances.    Multiple Pain Sites Yes    Pain Score 2    Pain Location Knee    Pain Orientation Left    Pain Descriptors / Indicators Tightness    Pain Type Chronic pain    Pain Radiating Towards NA    Pain Onset More than  a month ago    Pain Frequency Occasional    Aggravating Factors  Prolonged WB and postures    Pain Relieving Factors Rest    Effect of Pain on Daily Activities Walks slow without her cane                               [facility] Adult PT Treatment/Exercise - 09/30/20 0001       Bed Mobility   Bed Mobility Rolling Left;Rolling Right;Left Sidelying to Sit;Right Sidelying to Sit;Supine to Sit;Sit to Supine      Therapeutic Activites    Therapeutic Activities ADL's    ADL's Worked on bed mobility, log roll and body mechanics with house chores (sweeping) with cues to avoid flexion and change position frequently      Exercises   Exercises Knee/Hip      Knee/Hip Exercises: Aerobic   Recumbent Bike Seat 6 Level 3 for 8 minutes      Knee/Hip Exercises: Machines for  Strengthening   Total Gym Leg Press 100# 3 sets of 15 slow eccentrics with stretch into flexion      Knee/Hip Exercises: Standing   Other Standing Knee Exercises Standing lumbar extension 10X 3 seconds                    PT Education - 09/30/20 1622     Education Details 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 and ADLs    Person(s) Educated Patient    Methods Explanation;Demonstration;Verbal cues    Comprehension Verbal cues required;Returned demonstration;Need further instruction;Verbalized understanding              PT Short Term Goals - 09/30/20 1622       PT SHORT TERM GOAL #1   Title Pt will be I with initial HEP for knee AROM and strength    Status Achieved      PT SHORT TERM GOAL #2   Title Pt will be able to perform SLR on left LE without assist for improved sit to supine    Status Achieved      PT SHORT TERM GOAL #3   Title Pt will report less pain in left knee when sitting for improved comfort and rest at night    Status Achieved               PT Long Term Goals - 09/21/20 1204       PT LONG TERM GOAL #1   Title Patient will demonstrate/report pain at worst less than or equal to 2/10 to facilitate minimal limitation in daily activity secondary to pain symptoms.    Status On-going      PT LONG TERM GOAL #2   Title Patient will demonstrate independent use of home exercise program to facilitate ability to maintain/progress functional gains from skilled physical therapy services.    Status On-going      PT LONG TERM GOAL #3   Title Patient will demonstrate independent ambulation community distances > 300 ft to facilitate community integration at [facility].    Status On-going      PT LONG TERM GOAL #4   Title Pt. will demonstrate Left knee AORM 0-100 deg to facilitate ability to perform transfers, walking, stairs at PLOF s limitation.    Status On-going      PT LONG TERM GOAL #5   Title Pt. will demonstrate left knee MMT >/= 4/5  throughout to  facilitate independent ambulation, stair navigation, transfers at PLOF.    Status Achieved      PT LONG TERM GOAL #6   Title Pt. will demonstrate lumbar ext 100% WFL s symptoms to facilitate upright standing/walking posture at PLOF s limitation.    Status On-going                   Plan - 09/30/20 1623     Clinical Impression Statement [name] [name] is happy with her progress thus far and she notes things are "better every week."  We spent a lot of time with the interpreter's help on spine mechanics and practical work for her back.  I anticipate [name] [name] will DC to [facility] PT in September.    Personal Factors and Comorbidities Age;Comorbidity 3+;Transportation    Comorbidities right TKA, DM2, A-Fib, HTN, OA, Covid-19    Examination-Activity Limitations Bed Mobility;Locomotion Level;Stairs;Stand;Transfers    Examination-Participation Restrictions Community Activity;Meal Prep    Stability/Clinical Decision Making Stable/Uncomplicated    Rehab Potential Good    PT Frequency 2x / week    PT Duration 6 weeks    PT Treatment/Interventions ADLs/Self Care Home Management;Electrical Stimulation;Moist Heat;Cryotherapy;DME Instruction;Gait training;Stair training;Functional mobility training;Therapeutic activities;Therapeutic exercise;Balance training;Neuromuscular re-education;Patient/family education;Manual techniques;Scar mobilization;Passive range of motion;Vasopneumatic Device;Joint Manipulations;Spinal Manipulations;Iontophoresis 4mg /ml Dexamethasone;Traction    PT Next Visit Plan Knee flexion AROM, leg and low back strength, body mechanics, gait and balance    PT Home Exercise Plan Access Code:    Consulted and Agree with Plan of Care Patient             Patient will benefit from skilled therapeutic intervention in order to improve the following deficits and impairments:  Abnormal gait, Decreased activity tolerance, Decreased balance, Decreased endurance, Decreased  coordination, Decreased knowledge of use of DME, Decreased mobility, Decreased range of motion, Decreased skin integrity, Decreased scar mobility, Decreased strength, Increased edema, Postural dysfunction, Obesity, Pain, Hypomobility, Difficulty walking, Impaired perceived functional ability, Improper body mechanics, Impaired flexibility  Visit Diagnosis: Difficulty in walking, not elsewhere classified  Muscle weakness (generalized)  Chronic low back pain, unspecified back pain laterality, unspecified whether sciatica present  Stiffness of left knee, not elsewhere classified  Chronic pain of left knee     Problem List Patient Active Problem List   Diagnosis Date Noted   Arthritis of left knee    S/P TKR (total knee replacement), 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   Conjunctival hemorrhage of right eye 03/25/2020   Chronic anticoagulation 03/25/2020   S/P total knee arthroplasty 12/04/2019   S/P knee surgery 12/03/2019   Type 2 diabetes mellitus with obesity (HCC) 07/22/2019   Chronic atrial fibrillation (HCC) 02/26/2019   Essential hypertension, benign 05/03/2018   Knee osteoarthritis 10/24/2017    10/26/2017 PT, MPT 09/30/2020, 4:27 PM  [name] Physical Therapy [address] [place], [place], [place] Phone: [phone]   Fax:  [phone]  Name: [name] [name] MRN: [name] [name] Date of Birth: 05-27-1950

## 2020-09-30 NOTE — Telephone Encounter (Signed)
Received call from client's spouse Jed. Client's phone is not working. CN advised Jed that client has a PT appointment this afternoon- does she need transportation set up. Jed stated that appointment is tomorrow and Friday. CN confirmed that client has appointment 8/31 at 3:15. CN will call transportation services to confirm ride. Shann Medal RN BSN CNP [phone]

## 2020-09-30 NOTE — Telephone Encounter (Signed)
Received message from [facility] PT that client had completed appointment and was o the way home via [place]. Shann Medal RN BSN CNP [phone]

## 2020-09-30 NOTE — Patient Instructions (Signed)
Spent time reviewing bed mobility and HEP.

## 2020-09-30 NOTE — Telephone Encounter (Signed)
Call to client's spouse Jed. IDx 2. CN advised that rides to PT appointments have been confirmed for today and Friday 9/2. Shann Medal RN BSN CNP [phone]

## 2020-09-30 NOTE — Telephone Encounter (Signed)
Call to [employer]. Romeo Apple confirmed client rides for today and Friday 9/2. Shann Medal RN BSN CNP [phone]

## 2020-10-02 ENCOUNTER — Encounter: Payer: Self-pay | Admitting: Rehabilitative and Restorative Service Providers"

## 2020-10-02 ENCOUNTER — Ambulatory Visit (INDEPENDENT_AMBULATORY_CARE_PROVIDER_SITE_OTHER): Payer: Self-pay | Admitting: Rehabilitative and Restorative Service Providers"

## 2020-10-02 ENCOUNTER — Other Ambulatory Visit: Payer: Self-pay

## 2020-10-02 DIAGNOSIS — M6281 Muscle weakness (generalized): Secondary | ICD-10-CM

## 2020-10-02 DIAGNOSIS — M25662 Stiffness of left knee, not elsewhere classified: Secondary | ICD-10-CM

## 2020-10-02 DIAGNOSIS — G8929 Other chronic pain: Secondary | ICD-10-CM

## 2020-10-02 DIAGNOSIS — R262 Difficulty in walking, not elsewhere classified: Secondary | ICD-10-CM

## 2020-10-02 DIAGNOSIS — M545 Low back pain, unspecified: Secondary | ICD-10-CM

## 2020-10-02 DIAGNOSIS — R2681 Unsteadiness on feet: Secondary | ICD-10-CM

## 2020-10-02 DIAGNOSIS — M25562 Pain in left knee: Secondary | ICD-10-CM

## 2020-10-02 NOTE — Patient Instructions (Signed)
Access Code: [email or web address] URL: [email or web address] Date: 10/02/2020 Prepared by: Pauletta Browns  Exercises Supine Figure 4 Piriformis Stretch - 1 x daily - 7 x weekly - 1 sets - 5 reps - 20 seconds hold Sidelying Hip Abduction - 1 x daily - 3-4 x weekly - 3 sets - 10 reps - 3 seconds hold Tandem Stance - 2 x daily - 7 x weekly - 1 sets - 5 reps - 20 second hold Sit to Stand with Armchair - 3-4 x daily - 7 x weekly - 1 sets - 5 reps Standing Hip Hiking - 1 x daily - 3-4 x weekly - 2 sets - 10 reps - 3-5 seconds hold Standing Lumbar Extension at Wall - Forearms - 5 x daily - 7 x weekly - 1 sets - 5 reps - 3 seconds hold

## 2020-10-02 NOTE — Therapy (Signed)
[facility] [address] [place], [place], [address] Phone: [phone]   Fax:  [phone]  Physical Therapy Treatment  Patient Details  Name: [name] LaineVeronique Kabika [name] MRN: [id number] Date of Birth: Jun 04, 1950 Referring Provider (PT): Dr. Edger Houseean, Charles Magnant PA-C   Encounter Date: 10/02/2020   PT End of Session - 10/02/20 1707     Visit Number 23    Number of Visits 30    Date for PT Re-Evaluation 10/29/20    Authorization Type [facility] Financial Assistance thru 10/02/20    Progress Note Due on Visit 30    PT Start Time 1338    PT Stop Time 1430    PT Time Calculation (min) 52 min    Activity Tolerance Patient tolerated treatment well    Behavior [facility] Therapy [facility] for tasks assessed/performed             Past Medical History:  Diagnosis Date   Acute kidney injury (HCC) 03/02/2019   Acute respiratory failure with hypoxia (HCC) 02/22/2019   Atrial fibrillation with RVR (HCC) 02/26/2019   COVID-19 02/22/2019   Dysrhythmia    para. atrial fibrillation   Flu-like symptoms 02/22/2019   Hypertension    Knee osteoarthritis 10/24/2017   Pneumonia     Past Surgical History:  Procedure Laterality Date   NO PAST SURGERIES     TOTAL KNEE ARTHROPLASTY Right 12/03/2019   Procedure: RIGHT TOTAL KNEE ARTHROPLASTY;  Surgeon: Cammy Copaean, Gregory Scott, MD;  [facility];  Service: Orthopedics;  Laterality: Right;   TOTAL KNEE ARTHROPLASTY Left 07/07/2020   Procedure: LEFT TOTAL KNEE ARTHROPLASTY-CEMENTED;  Surgeon: Cammy Copaean, Gregory Scott, MD;  [facility];  Service: Orthopedics;  Laterality: Left;    There were no vitals filed for this visit.   Subjective Assessment - 10/02/20 1341     Subjective [name] [name] notes she has been incorporating some of her body mechanics techniques learned last visit into her ADLs.  Between that and her back exercises, she is noting progress.    Pertinent History right TKA, DM2, A-Fib, HTN, OA, Covid-19    Limitations  Lifting;Standing;Walking;House hold activities    How long can you sit comfortably? 30 minutes    How long can you stand comfortably? 10 minutes (was 5 minutes)    How long can you walk comfortably? 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 Yes    Pain Score 4     Pain Location Back    Pain Orientation Mid;Lower    Pain Descriptors / Indicators Aching;Sore    Pain Type Chronic pain    Pain Radiating Towards NA    Pain Onset More than a month ago    Pain Frequency Intermittent    Aggravating Factors  Flexion, Change of position after prolonged postures and prolonged postures.    Pain Relieving Factors Change of position and exercises.    Effect of Pain on Daily Activities Limits full participation with house chores and stiffness with prolonged postures and change of position.    Multiple Pain Sites Yes    Pain Score 4    Pain Location Knee    Pain Orientation Left    Pain Descriptors / Indicators Tightness    Pain Type Chronic pain    Pain Radiating Towards  NA    Pain Onset More than a month ago    Pain Frequency Occasional    Aggravating Factors  Prolonged WB and activities late in the day.    Pain Relieving Factors Rest, ice, exercises    Effect of Pain on Daily Activities Although improving, gait speed and endurance need work                               [facility] Adult PT Treatment/Exercise - 10/02/20 0001       Therapeutic Activites    Therapeutic Activities ADL's    ADL's Reviewed log roll and basic body mechanics with chores around the house      Neuro Re-ed    Neuro Re-ed Details  Tandem balance 5X 20 seconds each leg in front      Exercises   Exercises Knee/Hip      Knee/Hip Exercises: Aerobic   Recumbent Bike Seat 6 Level 3 for 8 minutes      Knee/Hip  Exercises: Machines for Strengthening   Total Gym Leg Press 100# 2 sets of 15 slow eccentrics with stretch into flexion      Knee/Hip Exercises: Standing   Other Standing Knee Exercises Standing lumbar extension 10X 3 seconds      Knee/Hip Exercises: Seated   Sit to Sand 5 reps;without UE support;Other (comment)   2 sets with slow eccentrics and trunk extension at the top     Vasopneumatic   Number Minutes Vasopneumatic  10 minutes    Vasopnuematic Location  Knee    Vasopneumatic Pressure Medium    Vasopneumatic Temperature  34*                    PT Education - 10/02/20 1431     Education Details Reviewed body mechanics.  Progressed balance due to wide based gait.    Person(s) Educated Patient    Methods Explanation;Demonstration;Verbal cues;Handout    Comprehension Verbalized understanding;Returned demonstration;Need further instruction;Verbal cues required              PT Short Term Goals - 09/30/20 1622       PT SHORT TERM GOAL #1   Title Pt will be I with initial HEP for knee AROM and strength    Status Achieved      PT SHORT TERM GOAL #2   Title Pt will be able to perform SLR on left LE without assist for improved sit to supine    Status Achieved      PT SHORT TERM GOAL #3   Title Pt will report less pain in left knee when sitting for improved comfort and rest at night    Status Achieved               PT Long Term Goals - 10/02/20 1706       PT LONG TERM GOAL #1   Title Patient will demonstrate/report pain at worst less than or equal to 2/10 to facilitate minimal limitation in daily activity secondary to pain symptoms.    Baseline 4/10 10/02/2020    Status On-going    Target Date 10/29/20      PT LONG TERM GOAL #2   Title Patient will demonstrate independent use of home exercise program to facilitate ability to maintain/progress functional gains from skilled physical therapy services.    Status On-going    Target Date 10/29/20      PT  LONG TERM GOAL #3   Title Patient will demonstrate independent ambulation community distances > 300 ft to facilitate community integration at [facility].    Baseline I with no AD    Status Achieved      PT LONG TERM GOAL #4   Title Pt. will demonstrate Left knee AORM 0-100 deg to facilitate ability to perform transfers, walking, stairs at PLOF s limitation.    Status On-going    Target Date 10/29/20      PT LONG TERM GOAL #5   Title Pt. will demonstrate left knee MMT >/= 4/5 throughout to facilitate independent ambulation, stair navigation, transfers at PLOF.    Status Achieved      PT LONG TERM GOAL #6   Title Pt. will demonstrate lumbar ext 100% WFL s symptoms to facilitate upright standing/walking posture at PLOF s limitation.    Status On-going                   Plan - 10/02/20 1708     Clinical Impression Statement Greenley reports incorporating her new body mechanics education into practice at home.  Back pain is improving and will also benefit from better gait quality (no AD but wide based).  Progressed balance and functional strength (sit to stand with slow eccentrics and tandem balance).  Anticipate DC soon.  RA AROM and FOTO soon.    Personal Factors and Comorbidities Age;Comorbidity 3+;Transportation    Comorbidities right TKA, DM2, A-Fib, HTN, OA, Covid-19    Examination-Activity Limitations Bed Mobility;Locomotion Level;Stairs;Stand;Transfers    Examination-Participation Restrictions Community Activity;Meal Prep    Stability/Clinical Decision Making Stable/Uncomplicated    Rehab Potential Good    PT Frequency 2x / week    PT Duration 6 weeks    PT Treatment/Interventions ADLs/Self Care Home Management;Electrical Stimulation;Moist Heat;Cryotherapy;DME Instruction;Gait training;Stair training;Functional mobility training;Therapeutic activities;Therapeutic exercise;Balance training;Neuromuscular re-education;Patient/family education;Manual techniques;Scar  mobilization;Passive range of motion;Vasopneumatic Device;Joint Manipulations;Spinal Manipulations;Iontophoresis 4mg /ml Dexamethasone;Traction    PT Next Visit Plan Still knee flexion AROM, leg and low back strength, body mechanics, gait and balance    PT Home Exercise Plan Access Code:    Consulted and Agree with Plan of Care Patient             Patient will benefit from skilled therapeutic intervention in order to improve the following deficits and impairments:  Abnormal gait, Decreased activity tolerance, Decreased balance, Decreased endurance, Decreased coordination, Decreased knowledge of use of DME, Decreased mobility, Decreased range of motion, Decreased skin integrity, Decreased scar mobility, Decreased strength, Increased edema, Postural dysfunction, Obesity, Pain, Hypomobility, Difficulty walking, Impaired perceived functional ability, Improper body mechanics, Impaired flexibility  Visit Diagnosis: Difficulty in walking, not elsewhere classified  Muscle weakness (generalized)  Chronic low back pain, unspecified back pain laterality, unspecified whether sciatica present  Stiffness of left knee, not elsewhere classified  Chronic pain of left knee  Unsteadiness on feet     Problem List Patient Active Problem List   Diagnosis Date Noted   Arthritis of left knee    S/P TKR (total knee replacement), 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   Conjunctival hemorrhage of right eye 03/25/2020   Chronic anticoagulation 03/25/2020   S/P total knee arthroplasty 12/04/2019   S/P knee surgery 12/03/2019   Type 2 diabetes mellitus with obesity (HCC) 07/22/2019   Chronic atrial fibrillation (HCC) 02/26/2019   Essential hypertension, benign 05/03/2018   Knee osteoarthritis 10/24/2017    10/26/2017 PT, MPT 10/02/2020, 5:11 PM  [facility] Therapy [phone] [place], [place], [address] Phone: [phone]   Fax:  [phone]  Name: [name] [name] MRN:  [id number] Date of Birth: [birth year]

## 2020-10-07 ENCOUNTER — Ambulatory Visit (INDEPENDENT_AMBULATORY_CARE_PROVIDER_SITE_OTHER): Payer: Self-pay | Admitting: Rehabilitative and Restorative Service Providers"

## 2020-10-07 ENCOUNTER — Encounter: Payer: Self-pay | Admitting: Rehabilitative and Restorative Service Providers"

## 2020-10-07 ENCOUNTER — Telehealth: Payer: Self-pay | Admitting: Pediatric Intensive Care

## 2020-10-07 ENCOUNTER — Other Ambulatory Visit: Payer: Self-pay

## 2020-10-07 DIAGNOSIS — M25562 Pain in left knee: Secondary | ICD-10-CM

## 2020-10-07 DIAGNOSIS — M6281 Muscle weakness (generalized): Secondary | ICD-10-CM

## 2020-10-07 DIAGNOSIS — R262 Difficulty in walking, not elsewhere classified: Secondary | ICD-10-CM

## 2020-10-07 DIAGNOSIS — R600 Localized edema: Secondary | ICD-10-CM

## 2020-10-07 DIAGNOSIS — G8929 Other chronic pain: Secondary | ICD-10-CM

## 2020-10-07 DIAGNOSIS — M25662 Stiffness of left knee, not elsewhere classified: Secondary | ICD-10-CM

## 2020-10-07 NOTE — Therapy (Signed)
[facility] Physical Therapy [address] [place], [place], [address] Phone: [phone]   Fax:  [phone]  Physical Therapy Treatment  Patient Details  Name: [name] [name] MRN: [id number] Date of Birth: September 05, 1950 Referring Provider (PT): Dr. Edger House PA-C   Encounter Date: 10/07/2020   PT End of Session - 10/07/20 1703     Visit Number 24    Number of Visits 30    Date for PT Re-Evaluation 10/29/20    Authorization Type [facility] [employer] thru 10/02/20    Progress Note Due on Visit 30    PT Start Time 1345    PT Stop Time 1438    PT Time Calculation (min) 53 min    Activity Tolerance Patient tolerated treatment well    Behavior During Therapy [facility] for tasks assessed/performed             Past Medical History:  Diagnosis Date   Acute kidney injury (HCC) 03/02/2019   Acute respiratory failure with hypoxia (HCC) 02/22/2019   Atrial fibrillation with RVR (HCC) 02/26/2019   COVID-19 02/22/2019   Dysrhythmia    para. atrial fibrillation   Flu-like symptoms 02/22/2019   Hypertension    Knee osteoarthritis 10/24/2017   Pneumonia     Past Surgical History:  Procedure Laterality Date   NO PAST SURGERIES     TOTAL KNEE ARTHROPLASTY Right 12/03/2019   Procedure: RIGHT TOTAL KNEE ARTHROPLASTY;  Surgeon: Cammy Copa, MD;  Location: [facility] OR;  Service: Orthopedics;  Laterality: Right;   TOTAL KNEE ARTHROPLASTY Left 07/07/2020   Procedure: LEFT TOTAL KNEE ARTHROPLASTY-CEMENTED;  Surgeon: Cammy Copa, MD;  Location: [facility] OR;  Service: Orthopedics;  Laterality: Left;    There were no vitals filed for this visit.   Subjective Assessment - 10/07/20 1353     Subjective [name] [name] is happy with her progress.  She would like to walk with a "more normal" gait.    Pertinent History right TKA, DM2, A-Fib, HTN, OA, Covid-19    Limitations Lifting;Standing;Walking;House hold activities    How long can you sit comfortably? 30  minutes    How long can you stand comfortably? 15-20 minutes (was 5 minutes)    How long can you walk comfortably? 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 No/denies    Pain Score 0-No pain    Pain Location Back    Pain Radiating Towards NA    Pain Onset More than a month ago    Pain Frequency Rarely    Aggravating Factors  Flexion    Pain Relieving Factors Exercises and postural correction    Effect of Pain on Daily Activities Has to watch body mechanics with house chores    Multiple Pain Sites Yes    Pain Score 4    Pain Location Knee    Pain Orientation Left    Pain Descriptors / Indicators Tightness    Pain Type Chronic pain    Pain Radiating Towards NA    Pain Onset More than a month ago    Pain Frequency Occasional    Aggravating Factors  Prolonged WB and activities late in the day    Pain Relieving Factors Rest, ice, exercises    Effect of Pain on Daily Activities Gait quality and endurance, weight-bearing endurance will benefit from continued strength  [facility] - 10/07/20 0001       Observation/Other Assessments   Focus on Therapeutic Outcomes (FOTO)  56 (was 46, Goal 65)      ROM / Strength   AROM / PROM / Strength AROM      AROM   Overall AROM  Deficits    AROM Assessment Site Knee    Right/Left Knee Left    Left Knee Extension 0    Left Knee Flexion 95                           [facility] Adult PT Treatment/Exercise - 10/07/20 0001       Therapeutic Activites    Therapeutic Activities ADL's    ADL's Reviewed log roll and postural basics      Neuro Re-ed    Neuro Re-ed Details  Tandem balance 5X 20 seconds each leg in front, dynamic narrow gait drills      Exercises   Exercises Knee/Hip      Knee/Hip Exercises: Aerobic   Recumbent Bike Seat 6 Level 3 for 8 minutes       Knee/Hip Exercises: Machines for Strengthening   Total Gym Leg Press 100# 2 sets of 15 slow eccentrics with stretch into flexion      Knee/Hip Exercises: Standing   Other Standing Knee Exercises Standing lumbar extension 10X 3 seconds      Knee/Hip Exercises: Seated   Other Seated Knee/Hip Exercises Tailgate knee flexion AAROM 3 minutes    Sit to Sand 5 reps;without UE support;Other (comment)   2 sets with slow eccentrics and trunk extension at the top     Vasopneumatic   Number Minutes Vasopneumatic  10 minutes    Vasopnuematic Location  Knee    Vasopneumatic Pressure Medium    Vasopneumatic Temperature  34*                  Upper Extremity Functional Index Score :   /80   PT Education - 10/07/20 1433     Education Details Reviewed HEP with emphasis as noted above    Person(s) Educated Patient    Methods Explanation;Demonstration;Verbal cues    Comprehension Verbalized understanding;Returned demonstration;Need further instruction;Verbal cues required              PT Short Term Goals - 09/30/20 1622       PT SHORT TERM GOAL #1   Title Pt will be I with initial HEP for knee AROM and strength    Status Achieved      PT SHORT TERM GOAL #2   Title Pt will be able to perform SLR on left LE without assist for improved sit to supine    Status Achieved      PT SHORT TERM GOAL #3   Title Pt will report less pain in left knee when sitting for improved comfort and rest at night    Status Achieved               PT Long Term Goals - 10/07/20 1701       PT LONG TERM GOAL #1   Title Patient will demonstrate/report pain at worst less than or equal to 2/10 to facilitate minimal limitation in daily activity secondary to pain symptoms.    Baseline 4/10 10/07/2020    Time 3    Period Weeks    Status On-going    Target Date 10/29/20  PT LONG TERM GOAL #2   Title Patient will demonstrate independent use of home exercise program to facilitate ability to  maintain/progress functional gains from skilled physical therapy services.    Status Achieved      PT LONG TERM GOAL #3   Title Patient will demonstrate independent ambulation community distances > 300 ft to facilitate community integration at [facility].    Baseline I with no AD    Status Achieved      PT LONG TERM GOAL #4   Title Pt. will demonstrate Left knee AORM 0-100 deg to facilitate ability to perform transfers, walking, stairs at PLOF s limitation.    Baseline 0-95 on 10/07/2020    Time 3    Period Weeks    Status On-going    Target Date 10/29/20      PT LONG TERM GOAL #5   Title Pt. will demonstrate left knee MMT >/= 4/5 throughout to facilitate independent ambulation, stair navigation, transfers at PLOF.    Status Achieved      PT LONG TERM GOAL #6   Title Pt. will demonstrate lumbar ext 100% WFL s symptoms to facilitate upright standing/walking posture at PLOF s limitation.    Status Achieved                   Plan - 10/07/20 1704     Clinical Impression Statement [name] [name] reports she has had no back pain for several days.  L knee flexion AROM, strength and gait quality remain a focus.  [name] [name] was cued to focus on these areas with her HEP and this will be a focus with her continued PT.    Personal Factors and Comorbidities Age;Comorbidity 3+;Transportation    Comorbidities right TKA, DM2, A-Fib, HTN, OA, Covid-19    Examination-Activity Limitations Bed Mobility;Locomotion Level;Stairs;Stand;Transfers    Examination-Participation Restrictions Community Activity;Meal Prep    Stability/Clinical Decision Making Stable/Uncomplicated    Rehab Potential Good    PT Frequency 2x / week    PT Duration 3 weeks    PT Treatment/Interventions ADLs/Self Care Home Management;Electrical Stimulation;Moist Heat;Cryotherapy;DME Instruction;Gait training;Stair training;Functional mobility training;Therapeutic activities;Therapeutic exercise;Balance training;Neuromuscular  re-education;Patient/family education;Manual techniques;Scar mobilization;Passive range of motion;Vasopneumatic Device;Joint Manipulations;Spinal Manipulations;Iontophoresis 4mg /ml Dexamethasone;Traction    PT Next Visit Plan L knee flexion AROM, quadriceps strength and gait quality    PT Home Exercise Plan Access Code:    Consulted and Agree with Plan of Care Patient             Patient will benefit from skilled therapeutic intervention in order to improve the following deficits and impairments:  Abnormal gait, Decreased activity tolerance, Decreased balance, Decreased endurance, Decreased coordination, Decreased knowledge of use of DME, Decreased mobility, Decreased range of motion, Decreased skin integrity, Decreased scar mobility, Decreased strength, Increased edema, Postural dysfunction, Obesity, Pain, Hypomobility, Difficulty walking, Impaired perceived functional ability, Improper body mechanics, Impaired flexibility  Visit Diagnosis: Difficulty in walking, not elsewhere classified  Muscle weakness (generalized)  Stiffness of left knee, not elsewhere classified  Chronic pain of left knee  Localized edema     Problem List Patient Active Problem List   Diagnosis Date Noted   Arthritis of left knee    S/P TKR (total knee replacement), 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 [place], [place], [address] Phone: [phone]   Fax:  [phone]  Name: [name] [name] MRN: [id number] Date of Birth: 1950/05/15

## 2020-10-07 NOTE — Patient Instructions (Signed)
Continue HEP with emphasis on flexion AROM, quadriceps strength and balance

## 2020-10-07 NOTE — Telephone Encounter (Signed)
Left message for Early Katrinka Blazing to return call regarding any client needs. Shann Medal RN BSN CNP [phone]

## 2020-10-07 NOTE — Telephone Encounter (Signed)
Call to transportation services to check status of ride to PT appointment this afternoon. Ride was set up this morning. Shann Medal RN BSN CNP [phone]

## 2020-10-08 ENCOUNTER — Ambulatory Visit (HOSPITAL_COMMUNITY): Payer: Self-pay

## 2020-10-09 ENCOUNTER — Encounter: Payer: Self-pay | Admitting: Physical Therapy

## 2020-10-09 ENCOUNTER — Ambulatory Visit (INDEPENDENT_AMBULATORY_CARE_PROVIDER_SITE_OTHER): Payer: Self-pay | Admitting: Physical Therapy

## 2020-10-09 ENCOUNTER — Other Ambulatory Visit: Payer: Self-pay

## 2020-10-09 DIAGNOSIS — M545 Low back pain, unspecified: Secondary | ICD-10-CM

## 2020-10-09 DIAGNOSIS — R2681 Unsteadiness on feet: Secondary | ICD-10-CM

## 2020-10-09 DIAGNOSIS — R262 Difficulty in walking, not elsewhere classified: Secondary | ICD-10-CM

## 2020-10-09 DIAGNOSIS — M25662 Stiffness of left knee, not elsewhere classified: Secondary | ICD-10-CM

## 2020-10-09 DIAGNOSIS — R600 Localized edema: Secondary | ICD-10-CM

## 2020-10-09 DIAGNOSIS — M6281 Muscle weakness (generalized): Secondary | ICD-10-CM

## 2020-10-09 DIAGNOSIS — G8929 Other chronic pain: Secondary | ICD-10-CM

## 2020-10-09 DIAGNOSIS — M25562 Pain in left knee: Secondary | ICD-10-CM

## 2020-10-09 NOTE — Therapy (Signed)
[facility] Physical Therapy [address] [place], [place], [address] Phone: [phone]   Fax:  [phone]  Physical Therapy Treatment  Patient Details  Name: [name] [name] MRN: [id number] Date of Birth: 01-24-1951 Referring Provider (PT): Dr. Edger House PA-C   Encounter Date: 10/09/2020   PT End of Session - 10/09/20 1054     Visit Number 25    Number of Visits 30    Date for PT Re-Evaluation 10/29/20    Authorization Type [facility] [employer] Assistance thru 10/02/20    Progress Note Due on Visit 30    PT Start Time 1010    PT Stop Time 1051    PT Time Calculation (min) 41 min    Activity Tolerance Patient tolerated treatment well    Behavior During Therapy [facility] for tasks assessed/performed             Past Medical History:  Diagnosis Date   Acute kidney injury (HCC) 03/02/2019   Acute respiratory failure with hypoxia (HCC) 02/22/2019   Atrial fibrillation with RVR (HCC) 02/26/2019   COVID-19 02/22/2019   Dysrhythmia    para. atrial fibrillation   Flu-like symptoms 02/22/2019   Hypertension    Knee osteoarthritis 10/24/2017   Pneumonia     Past Surgical History:  Procedure Laterality Date   NO PAST SURGERIES     TOTAL KNEE ARTHROPLASTY Right 12/03/2019   Procedure: RIGHT TOTAL KNEE ARTHROPLASTY;  Surgeon: Cammy Copa, MD;  Location: [facility] OR;  Service: Orthopedics;  Laterality: Right;   TOTAL KNEE ARTHROPLASTY Left 07/07/2020   Procedure: LEFT TOTAL KNEE ARTHROPLASTY-CEMENTED;  Surgeon: Cammy Copa, MD;  Location: [facility] OR;  Service: Orthopedics;  Laterality: Left;    There were no vitals filed for this visit.   Subjective Assessment - 10/09/20 1006     Subjective has a little pain in Lt knee    Pertinent History right TKA, DM2, A-Fib, HTN, OA, Covid-19    Limitations Lifting;Standing;Walking;House hold activities    How long can you sit comfortably? 30 minutes    How long can you stand comfortably? 15-20 minutes (was  5 minutes)    How long can you walk comfortably? 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 No/denies    Pain Score 5     Pain Location Knee    Pain Orientation Left    Pain Descriptors / Indicators Aching;Sore    Pain Type Surgical pain;Acute pain    Pain Onset More than a month ago    Pain Frequency Rarely    Aggravating Factors  flexion    Pain Relieving Factors exercises                               [facility] Adult PT Treatment/Exercise - 10/09/20 0001       Knee/Hip Exercises: Stretches   Passive Hamstring Stretch Right;Left;3 reps;30 seconds    Passive Hamstring Stretch Limitations seated      Knee/Hip Exercises: Aerobic   Recumbent Bike Seat 6 Level 1 for 8 minutes      Knee/Hip Exercises: Machines for Strengthening   Cybex Knee Extension eccentric Lt leg 10 lbs 3 x 10    Cybex Knee Flexion 15# 3x10    Total Gym Leg Press 100# 2 sets  of 15 slow eccentrics with stretch into flexion; LLE only 37# 3x10      Knee/Hip Exercises: Seated   Sit to Sand 2 sets;10 reps;without UE support                 Balance Exercises - 10/09/20 0001       Balance Exercises: Standing   Tandem Stance Upper extremity support 1;Eyes open;30 secs;2 reps                  PT Short Term Goals - 09/30/20 1622       PT SHORT TERM GOAL #1   Title Pt will be I with initial HEP for knee AROM and strength    Status Achieved      PT SHORT TERM GOAL #2   Title Pt will be able to perform SLR on left LE without assist for improved sit to supine    Status Achieved      PT SHORT TERM GOAL #3   Title Pt will report less pain in left knee when sitting for improved comfort and rest at night    Status Achieved               PT Long Term Goals - 10/07/20 1701       PT LONG TERM GOAL #1   Title Patient will demonstrate/report pain  at worst less than or equal to 2/10 to facilitate minimal limitation in daily activity secondary to pain symptoms.    Baseline 4/10 10/07/2020    Time 3    Period Weeks    Status On-going    Target Date 10/29/20      PT LONG TERM GOAL #2   Title Patient will demonstrate independent use of home exercise program to facilitate ability to maintain/progress functional gains from skilled physical therapy services.    Status Achieved      PT LONG TERM GOAL #3   Title Patient will demonstrate independent ambulation community distances > 300 ft to facilitate community integration at [facility].    Baseline I with no AD    Status Achieved      PT LONG TERM GOAL #4   Title Pt. will demonstrate Left knee AORM 0-100 deg to facilitate ability to perform transfers, walking, stairs at PLOF s limitation.    Baseline 0-95 on 10/07/2020    Time 3    Period Weeks    Status On-going    Target Date 10/29/20      PT LONG TERM GOAL #5   Title Pt. will demonstrate left knee MMT >/= 4/5 throughout to facilitate independent ambulation, stair navigation, transfers at PLOF.    Status Achieved      PT LONG TERM GOAL #6   Title Pt. will demonstrate lumbar ext 100% WFL s symptoms to facilitate upright standing/walking posture at PLOF s limitation.    Status Achieved                   Plan - 10/09/20 1055     Clinical Impression Statement Pt tolerated session well today with expected fatigue noted with strengthening activities.  Anticiapte d/c next week.    Personal Factors and Comorbidities Age;Comorbidity 3+;Transportation    Comorbidities right TKA, DM2, A-Fib, HTN, OA, Covid-19    Examination-Activity Limitations Bed Mobility;Locomotion Level;Stairs;Stand;Transfers    Examination-Participation Restrictions Community Activity;Meal Prep    Stability/Clinical Decision Making Stable/Uncomplicated    Rehab Potential Good    PT Frequency 2x / week  PT Duration 3 weeks    PT Treatment/Interventions  ADLs/Self Care Home Management;Electrical Stimulation;Moist Heat;Cryotherapy;DME Instruction;Gait training;Stair training;Functional mobility training;Therapeutic activities;Therapeutic exercise;Balance training;Neuromuscular re-education;Patient/family education;Manual techniques;Scar mobilization;Passive range of motion;Vasopneumatic Device;Joint Manipulations;Spinal Manipulations;Iontophoresis 4mg /ml Dexamethasone;Traction    PT Next Visit Plan L knee flexion AROM, quadriceps strength and gait quality, anticipate d/c next week    PT Home Exercise Plan Access Code:    Consulted and Agree with Plan of Care Patient             Patient will benefit from skilled therapeutic intervention in order to improve the following deficits and impairments:  Abnormal gait, Decreased activity tolerance, Decreased balance, Decreased endurance, Decreased coordination, Decreased knowledge of use of DME, Decreased mobility, Decreased range of motion, Decreased skin integrity, Decreased scar mobility, Decreased strength, Increased edema, Postural dysfunction, Obesity, Pain, Hypomobility, Difficulty walking, Impaired perceived functional ability, Improper body mechanics, Impaired flexibility  Visit Diagnosis: Difficulty in walking, not elsewhere classified  Muscle weakness (generalized)  Stiffness of left knee, not elsewhere classified  Chronic pain of left knee  Localized edema  Chronic low back pain, unspecified back pain laterality, unspecified whether sciatica present  Unsteadiness on feet     Problem List Patient Active Problem List   Diagnosis Date Noted   Arthritis of left knee    S/P TKR (total knee replacement), 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   Conjunctival hemorrhage of right eye 03/25/2020   Chronic anticoagulation 03/25/2020   S/P total knee arthroplasty 12/04/2019   S/P knee surgery 12/03/2019   Type 2 diabetes mellitus with obesity (HCC) 07/22/2019   Chronic atrial fibrillation (HCC)  02/26/2019   Essential hypertension, benign 05/03/2018   Knee osteoarthritis 10/24/2017      10/26/2017, PT, DPT 10/09/20 10:58 AM     [facility] Physical Therapy [address] [place], [place], [place] Phone: [phone]   Fax:  [phone]  Name: [name] [name] MRN: Avelina Laine Date of Birth: 12-13-1950

## 2020-10-14 ENCOUNTER — Encounter: Payer: Self-pay | Admitting: Rehabilitative and Restorative Service Providers"

## 2020-10-14 ENCOUNTER — Ambulatory Visit (INDEPENDENT_AMBULATORY_CARE_PROVIDER_SITE_OTHER): Payer: Self-pay | Admitting: Rehabilitative and Restorative Service Providers"

## 2020-10-14 ENCOUNTER — Other Ambulatory Visit: Payer: Self-pay

## 2020-10-14 DIAGNOSIS — G8929 Other chronic pain: Secondary | ICD-10-CM

## 2020-10-14 DIAGNOSIS — R600 Localized edema: Secondary | ICD-10-CM

## 2020-10-14 DIAGNOSIS — R262 Difficulty in walking, not elsewhere classified: Secondary | ICD-10-CM

## 2020-10-14 DIAGNOSIS — M25662 Stiffness of left knee, not elsewhere classified: Secondary | ICD-10-CM

## 2020-10-14 DIAGNOSIS — R2681 Unsteadiness on feet: Secondary | ICD-10-CM

## 2020-10-14 DIAGNOSIS — M25562 Pain in left knee: Secondary | ICD-10-CM

## 2020-10-14 DIAGNOSIS — M545 Low back pain, unspecified: Secondary | ICD-10-CM

## 2020-10-14 DIAGNOSIS — M6281 Muscle weakness (generalized): Secondary | ICD-10-CM

## 2020-10-14 NOTE — Patient Instructions (Signed)
Access Code: [email or web address] URL: [email or web address] Date: 10/14/2020 Prepared by: Chyrel Masson  Exercises Seated Knee Flexion AAROM - 3-5 x daily - 7 x weekly - 1 sets - 10 reps - 10 sec hold Supine Quadricep Sets - 2-3 x daily - 7 x weekly - 2-3 sets - 10 reps - 5 second hold Seated Straight Leg Heel Taps - 1-2 x daily - 7 x weekly - 3 sets - 10 reps Supine Heel Slide (Mirrored) - 2 x daily - 7 x weekly - 3 sets - 10 reps - 2 hold Sit to Stand - 1 x daily - 7 x weekly - 3 sets - 10 reps Supine Gluteal Sets - 2 x daily - 7 x weekly - 1 sets - 10 reps - 5 hold Standing Lumbar Extension - 2 x daily - 7 x weekly - 1 sets - 10 reps

## 2020-10-14 NOTE — Therapy (Signed)
[facility] [address] [place], [place], [address] Phone: [phone]   Fax:  [phone]  Physical Therapy Treatment /Discharge  Patient Details  Name: [name] [name] MRN: [id number] Date of Birth: [birth year] Referring Provider (PT): Dr. Park Liter PA-C   Encounter Date: 10/14/2020   PT End of Session - 10/14/20 1336     Visit Number 26    Number of Visits 30    Date for PT Re-Evaluation 10/29/20    Progress Note Due on Visit 30    PT Start Time 1342    PT Stop Time 1412    PT Time Calculation (min) 30 min    Activity Tolerance Patient tolerated treatment well    Behavior During Therapy [facility] for tasks assessed/performed             Past Medical History:  Diagnosis Date   Acute kidney injury ([place]) 03/02/2019   Acute respiratory failure with hypoxia ([place]) 02/22/2019   Atrial fibrillation with RVR ([place]) 02/26/2019   COVID-19 02/22/2019   Dysrhythmia    para. atrial fibrillation   Flu-like symptoms 02/22/2019   Hypertension    Knee osteoarthritis 10/24/2017   Pneumonia     Past Surgical History:  Procedure Laterality Date   NO PAST SURGERIES     TOTAL KNEE ARTHROPLASTY Right 12/03/2019   Procedure: RIGHT TOTAL KNEE ARTHROPLASTY;  Surgeon: Meredith Pel, MD;  Location: [place];  Service: Orthopedics;  Laterality: Right;   TOTAL KNEE ARTHROPLASTY Left 07/07/2020   Procedure: LEFT TOTAL KNEE ARTHROPLASTY-CEMENTED;  Surgeon: Meredith Pel, MD;  Location: [place];  Service: Orthopedics;  Laterality: Left;    There were no vitals filed for this visit.   Subjective Assessment - 10/14/20 1347     Subjective Pt. reported a little pain in Lt leg.  Pt. stated she walked for an hour at home without complaints.    Pertinent History right TKA, DM2, A-Fib, HTN, OA, Covid-19    Limitations Lifting;Standing;Walking;House hold activities    Patient Stated Goals She wants to be able to be able find a job looking after  children, stand to cook and walk in community, visiting friends    Currently in Pain? Yes    Pain Score 1    a little   Pain Location Knee    Pain Orientation Left    Pain Descriptors / Indicators Sore    Pain Type Chronic pain;Surgical pain    Pain Onset More than a month ago    Pain Frequency Occasional    Aggravating Factors  no specific aggravating factor noted    Pain Relieving Factors general movement has helped                               [facility] Adult PT Treatment/Exercise - 10/14/20 0001       Exercises   Exercises Other Exercises    Other Exercises  Review of existing HEP for continued use upon discharge      Knee/Hip Exercises: Aerobic   Nustep UE/LE Lvl 6 10 mins      Knee/Hip Exercises: Machines for Strengthening   Total Gym Leg Press 100# 2 sets of 15 slow eccentrics with stretch into flexion; single leg 37# 2x10 bilaterally      Knee/Hip Exercises: Standing   Other Standing Knee Exercises standing lumbar extension review x 10      Knee/Hip Exercises: Seated   Other Seated  Knee/Hip Exercises seated SLR x 15 Lt leg    Sit to Sand without UE support;15 reps   18 inch chair                    PT Education - 10/14/20 1352     Education Details Verbal and demonstration review, trials of previous HEP for discharge.    Person(s) Educated Patient    Methods Explanation;Demonstration;Verbal cues    Comprehension Returned demonstration;Verbalized understanding              PT Short Term Goals - 09/30/20 1622       PT SHORT TERM GOAL #1   Title Pt will be I with initial HEP for knee AROM and strength    Status Achieved      PT SHORT TERM GOAL #2   Title Pt will be able to perform SLR on left LE without assist for improved sit to supine    Status Achieved      PT SHORT TERM GOAL #3   Title Pt will report less pain in left knee when sitting for improved comfort and rest at night    Status Achieved               PT  Long Term Goals - 10/14/20 1352       PT LONG TERM GOAL #1   Title Patient will demonstrate/report pain at worst less than or equal to 2/10 to facilitate minimal limitation in daily activity secondary to pain symptoms.    Time 3    Period Weeks    Status Partially Met      PT LONG TERM GOAL #2   Title Patient will demonstrate independent use of home exercise program to facilitate ability to maintain/progress functional gains from skilled physical therapy services.    Status Achieved      PT LONG TERM GOAL #3   Title Patient will demonstrate independent ambulation community distances > 300 ft to facilitate community integration at [facility].    Baseline I with no AD    Status Achieved      PT LONG TERM GOAL #4   Title Pt. will demonstrate Left knee AORM 0-100 deg to facilitate ability to perform transfers, walking, stairs at PLOF s limitation.    Time 3    Period Weeks    Status Partially Met      PT LONG TERM GOAL #5   Title Pt. will demonstrate left knee MMT >/= 4/5 throughout to facilitate independent ambulation, stair navigation, transfers at PLOF.    Status Achieved      PT LONG TERM GOAL #6   Title Pt. will demonstrate lumbar ext 100% WFL s symptoms to facilitate upright standing/walking posture at PLOF s limitation.    Status Achieved                   Plan - 10/14/20 1353     Clinical Impression Statement Pt. is appropriate at this time for discharge to HEP for continued gains and management of current condition.  Independent ambulation noted at this time.  Good knowledge of existing HEP noted today. See objective data and previous notes for functional gains made to this point.    Personal Factors and Comorbidities Age;Comorbidity 3+;Transportation    Comorbidities right TKA, DM2, A-Fib, HTN, OA, Covid-19    Examination-Activity Limitations Bed Mobility;Locomotion Level;Stairs;Stand;Transfers    Examination-Participation Restrictions Community Activity;Meal Prep     Stability/Clinical Decision Making Stable/Uncomplicated  Rehab Potential Good    PT Treatment/Interventions ADLs/Self Care Home Management;Electrical Stimulation;Moist Heat;Cryotherapy;DME Instruction;Gait training;Stair training;Functional mobility training;Therapeutic activities;Therapeutic exercise;Balance training;Neuromuscular re-education;Patient/family education;Manual techniques;Scar mobilization;Passive range of motion;Vasopneumatic Device;Joint Manipulations;Spinal Manipulations;Iontophoresis 4mg /ml Dexamethasone;Traction    PT Next Visit Plan D/C to HEP    PT Home Exercise Plan Access Code: [email or web address]    Consulted and Agree with Plan of Care Patient             Patient will benefit from skilled therapeutic intervention in order to improve the following deficits and impairments:  Abnormal gait, Decreased activity tolerance, Decreased balance, Decreased endurance, Decreased coordination, Decreased knowledge of use of DME, Decreased mobility, Decreased range of motion, Decreased skin integrity, Decreased scar mobility, Decreased strength, Increased edema, Postural dysfunction, Obesity, Pain, Hypomobility, Difficulty walking, Impaired perceived functional ability, Improper body mechanics, Impaired flexibility  Visit Diagnosis: Muscle weakness (generalized)  Difficulty in walking, not elsewhere classified  Localized edema  Chronic pain of left knee  Stiffness of left knee, not elsewhere classified  Unsteadiness on feet  Chronic low back pain, unspecified back pain laterality, unspecified whether sciatica present     Problem List Patient Active Problem List   Diagnosis Date Noted   Arthritis of left knee    S/P TKR (total knee replacement), 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 Patient goals were met. Patient is being discharged due to being pleased with the current functional level.  Scot Jun, PT, DPT, OCS, ATC 10/14/20  2:20 PM     [facility] [address] [place], [place], [address] Phone: [phone]   Fax:  [phone]  Name: [name] [name] MRN: [id number] Date of Birth: 1950/05/26

## 2020-10-16 ENCOUNTER — Ambulatory Visit (HOSPITAL_COMMUNITY): Payer: Self-pay | Attending: Surgical

## 2020-10-16 ENCOUNTER — Encounter: Payer: Self-pay | Admitting: Rehabilitative and Restorative Service Providers"

## 2020-11-09 ENCOUNTER — Other Ambulatory Visit: Payer: Self-pay | Admitting: Family Medicine

## 2020-11-09 ENCOUNTER — Other Ambulatory Visit: Payer: Self-pay

## 2020-11-09 MED ORDER — GABAPENTIN 100 MG PO CAPS
ORAL_CAPSULE | Freq: Three times a day (TID) | ORAL | 1 refills | Status: DC
  Filled 2020-11-09: qty 90, 30d supply, fill #0
  Filled 2020-12-10: qty 90, 30d supply, fill #1

## 2020-11-10 ENCOUNTER — Other Ambulatory Visit: Payer: Self-pay

## 2020-11-12 NOTE — Congregational Nurse Program (Signed)
  Dept: [phone]   Congregational Nurse Program Note  Date of Encounter: 11/11/20  Past Medical History: Past Medical History:  Diagnosis Date   Acute kidney injury (HCC) 03/02/2019   Acute respiratory failure with hypoxia (HCC) 02/22/2019   Atrial fibrillation with RVR (HCC) 02/26/2019   COVID-19 02/22/2019   Dysrhythmia    para. atrial fibrillation   Flu-like symptoms 02/22/2019   Hypertension    Knee osteoarthritis 10/24/2017   Pneumonia     Encounter Details:  Patient came to reestablished care with Congregational RN. Patient is currently taking prescribed medication but unaware of the name and dosages. I have requested her to bring pill bottles for review and education.   Nicole Cella Ceceilia Cephus RN BSn PCCN  [facility] [phone]-office

## 2020-11-17 ENCOUNTER — Other Ambulatory Visit: Payer: Self-pay

## 2020-11-17 DIAGNOSIS — I10 Essential (primary) hypertension: Secondary | ICD-10-CM

## 2020-11-17 LAB — GLUCOSE, POCT (MANUAL RESULT ENTRY): POC Glucose: 96 mg/dl (ref 70–99)

## 2020-11-17 NOTE — Congregational Nurse Program (Signed)
  Dept: [phone]   Congregational Nurse Program Note  Date of Encounter: 11/17/2020  Past Medical History: Past Medical History:  Diagnosis Date   Acute kidney injury (HCC) 03/02/2019   Acute respiratory failure with hypoxia (HCC) 02/22/2019   Atrial fibrillation with RVR (HCC) 02/26/2019   COVID-19 02/22/2019   Dysrhythmia    para. atrial fibrillation   Flu-like symptoms 02/22/2019   Hypertension    Knee osteoarthritis 10/24/2017   Pneumonia     Encounter Details:  patient brought her medication for review. She is taking all her medication per instructions. She is compliant and does not need a refill at this time. Patient blood pressure continues to be elevated despite taking medication. I have educated her on heart healthy diet and exercises.  Nicole Cella Jacarra Bobak RN BSn PCCN  [facility] [phone]-cell [phone]-office

## 2020-11-18 NOTE — Congregational Nurse Program (Signed)
  Dept: [phone]   Congregational Nurse Program Note  Date of Encounter: 11/18/2020  Past Medical History: Past Medical History:  Diagnosis Date   Acute kidney injury (HCC) 03/02/2019   Acute respiratory failure with hypoxia (HCC) 02/22/2019   Atrial fibrillation with RVR (HCC) 02/26/2019   COVID-19 02/22/2019   Dysrhythmia    para. atrial fibrillation   Flu-like symptoms 02/22/2019   Hypertension    Knee osteoarthritis 10/24/2017   Pneumonia     Encounter Details:  Patient came in for blood pressure check. Today blood pressure 153/90 HR 73.She reports to be compliant with medication. Re-emphasized heart healthy diet and exercise.  Nicole Cella Faylene Allerton RN BSn PCCN  [facility] [phone]-cell [phone]-office

## 2020-11-24 NOTE — Congregational Nurse Program (Signed)
  Dept: [phone]   Congregational Nurse Program Note  Date of Encounter: 11/24/2020  Past Medical History: Past Medical History:  Diagnosis Date   Acute kidney injury (HCC) 03/02/2019   Acute respiratory failure with hypoxia (HCC) 02/22/2019   Atrial fibrillation with RVR (HCC) 02/26/2019   COVID-19 02/22/2019   Dysrhythmia    para. atrial fibrillation   Flu-like symptoms 02/22/2019   Hypertension    Knee osteoarthritis 10/24/2017   Pneumonia     Encounter Details:   Came for blood pressure check. BP 133/82 much better compared to previous reading. I have emphasized to continue with heart health diet,low salt and to incorporate routine gentle work out like walking.   Nicole Cella Nakeem Murnane RN BSn PCCN  [facility] [phone]-cell [phone]-office

## 2020-11-30 ENCOUNTER — Ambulatory Visit: Payer: Self-pay | Attending: Family Medicine | Admitting: Family Medicine

## 2020-11-30 ENCOUNTER — Other Ambulatory Visit: Payer: Self-pay

## 2020-11-30 VITALS — BP 127/78 | HR 66 | Ht 63.0 in | Wt 223.0 lb

## 2020-11-30 DIAGNOSIS — Z1211 Encounter for screening for malignant neoplasm of colon: Secondary | ICD-10-CM

## 2020-11-30 DIAGNOSIS — I482 Chronic atrial fibrillation, unspecified: Secondary | ICD-10-CM

## 2020-11-30 DIAGNOSIS — E669 Obesity, unspecified: Secondary | ICD-10-CM

## 2020-11-30 DIAGNOSIS — I10 Essential (primary) hypertension: Secondary | ICD-10-CM

## 2020-11-30 DIAGNOSIS — Z23 Encounter for immunization: Secondary | ICD-10-CM

## 2020-11-30 DIAGNOSIS — H539 Unspecified visual disturbance: Secondary | ICD-10-CM

## 2020-11-30 DIAGNOSIS — E1169 Type 2 diabetes mellitus with other specified complication: Secondary | ICD-10-CM

## 2020-11-30 LAB — POCT GLYCOSYLATED HEMOGLOBIN (HGB A1C): HbA1c, POC (controlled diabetic range): 5.7 % (ref 0.0–7.0)

## 2020-11-30 MED ORDER — HYDROCHLOROTHIAZIDE 25 MG PO TABS
25.0000 mg | ORAL_TABLET | Freq: Every day | ORAL | 1 refills | Status: DC
  Filled 2020-11-30: qty 30, 30d supply, fill #0
  Filled 2021-01-12: qty 30, 30d supply, fill #1
  Filled 2021-02-15: qty 30, 30d supply, fill #0
  Filled 2021-03-17: qty 30, 30d supply, fill #1
  Filled 2021-04-21: qty 30, 30d supply, fill #2
  Filled 2021-05-20: qty 30, 30d supply, fill #3

## 2020-11-30 MED ORDER — APIXABAN 5 MG PO TABS
5.0000 mg | ORAL_TABLET | Freq: Two times a day (BID) | ORAL | 1 refills | Status: DC
  Filled 2020-11-30: qty 180, 90d supply, fill #0
  Filled 2021-06-23 – 2021-06-24 (×2): qty 14, 7d supply, fill #0
  Filled 2021-11-24: qty 14, 7d supply, fill #1

## 2020-11-30 NOTE — Progress Notes (Signed)
Referral to eye doctor

## 2020-11-30 NOTE — Progress Notes (Signed)
Subjective:  Patient ID: [name] [name], female    DOB: 12-03-1950  Age: 70 y.o. MRN: [id number]  CC: Hypertension   HPI Daise Giovanelli is a 70 y.o. year old female with a history of  Type 2 DM (diet controlled with A1c of 5.7), Hypertension,  A.fib with RVR, s/p b/l TKA. She presents with a friend who is her major care giver - Early Katrinka Blazing who serves as Equities trader.  Interval History: She is currently undergoing PT due to knee pain and bilateral leg weakness.  Her diabetes is diet controlled.  She has had no hypoglycemic episodes or numbness in extremities Vision in her L eye is decreased and she would like a referral to Ophthalmology.  She has no palpitations, lightheadedness, chest pain, dyspnea. Denies additional concerns today. Past Medical History:  Diagnosis Date   Acute kidney injury (HCC) 03/02/2019   Acute respiratory failure with hypoxia (HCC) 02/22/2019   Atrial fibrillation with RVR (HCC) 02/26/2019   COVID-19 02/22/2019   Dysrhythmia    para. atrial fibrillation   Flu-like symptoms 02/22/2019   Hypertension    Knee osteoarthritis 10/24/2017   Pneumonia     Past Surgical History:  Procedure Laterality Date   NO PAST SURGERIES     TOTAL KNEE ARTHROPLASTY Right 12/03/2019   Procedure: RIGHT TOTAL KNEE ARTHROPLASTY;  Surgeon: Cammy Copa, MD;  Location: [facility] OR;  Service: Orthopedics;  Laterality: Right;   TOTAL KNEE ARTHROPLASTY Left 07/07/2020   Procedure: LEFT TOTAL KNEE ARTHROPLASTY-CEMENTED;  Surgeon: Cammy Copa, MD;  Location: [facility] OR;  Service: Orthopedics;  Laterality: Left;    Family History  Family history unknown: Yes    No Known Allergies  Outpatient Medications Prior to Visit  Medication Sig Dispense Refill   amLODipine (NORVASC) 5 MG tablet Take 1 tablet (5 mg total) by mouth daily. 90 tablet 0   diltiazem (CARDIZEM CD) 120 MG 24 hr capsule TAKE 1 CAPSULE (120 MG TOTAL) BY MOUTH DAILY. 90 capsule 2   gabapentin  (NEURONTIN) 100 MG capsule TAKE 1 CAPSULE (100 MG TOTAL) BY MOUTH 3 (THREE) TIMES DAILY. 90 capsule 1   methocarbamol (ROBAXIN) 500 MG tablet Take 1 tablet (500 mg total) by mouth every 8 (eight) hours as needed for muscle spasms. 30 tablet 0   pantoprazole (PROTONIX) 40 MG tablet TAKE 1 TABLET (40 MG TOTAL) BY MOUTH DAILY. TO REDUCE STOMACH ACID 90 tablet 3   rosuvastatin (CRESTOR) 10 MG tablet Take 1 tablet (10 mg total) by mouth daily. To lower cholesterol 90 tablet 1   apixaban (ELIQUIS) 5 MG TABS tablet Take 1 tablet (5 mg total) by mouth 2 (two) times daily. 180 tablet 1   traMADol (ULTRAM) 50 MG tablet Take 1 tablet (50 mg total) by mouth every 12 (twelve) hours as needed. (Patient not taking: Reported on 11/30/2020) 30 tablet 0   No facility-administered medications prior to visit.     ROS Review of Systems  Constitutional:  Negative for activity change, appetite change and fatigue.  HENT:  Negative for congestion, sinus pressure and sore throat.   Eyes:  Negative for visual disturbance.  Respiratory:  Negative for cough, chest tightness, shortness of breath and wheezing.   Cardiovascular:  Negative for chest pain and palpitations.  Gastrointestinal:  Negative for abdominal distention, abdominal pain and constipation.  Endocrine: Negative for polydipsia.  Genitourinary:  Negative for dysuria and frequency.  Musculoskeletal:  Negative for arthralgias and back pain.  Skin:  Negative for rash.  Neurological:  Negative for tremors, light-headedness and numbness.  Hematological:  Does not bruise/bleed easily.  Psychiatric/Behavioral:  Negative for agitation and behavioral problems.    Objective:  BP 127/78   Pulse 66   Ht 5\' 3"  (1.6 m)   Wt 223 lb (101.2 kg)   LMP  (LMP Unknown)   SpO2 99%   BMI 39.50 kg/m   BP/Weight 11/30/2020 11/24/2020 11/18/2020  Systolic BP 127 133 153  Diastolic BP 78 82 90  Wt. (Lbs) 223 - -  BMI 39.5 - -      Physical Exam Constitutional:       Appearance: She is well-developed.  Cardiovascular:     Rate and Rhythm: Normal rate.     Heart sounds: Normal heart sounds. No murmur heard. Pulmonary:     Effort: Pulmonary effort is normal.     Breath sounds: Normal breath sounds. No wheezing or rales.  Chest:     Chest wall: No tenderness.  Abdominal:     General: Bowel sounds are normal. There is no distension.     Palpations: Abdomen is soft. There is no mass.     Tenderness: There is no abdominal tenderness.  Musculoskeletal:        General: Normal range of motion.     Right lower leg: Edema (1+) present.     Left lower leg: Edema (1+) present.  Neurological:     Mental Status: She is alert and oriented to person, place, and time.  Psychiatric:        Mood and Affect: Mood normal.    CMP Latest Ref Rng & Units 07/01/2020 12/16/2019 12/04/2019  Glucose 70 - 99 mg/dL 90 13/04/2019) 84(Z)  BUN 8 - 23 mg/dL 22 9 11   Creatinine 0.44 - 1.00 mg/dL 660(Y) 3.01(S  Sodium 135 - 145 mmol/L 138 143 138  Potassium 3.5 - 5.1 mmol/L 4.6 4.2 4.2  Chloride 98 - 111 mmol/L 106 106 104  CO2 22 - 32 mmol/L 23 21 23   Calcium 8.9 - 10.3 mg/dL 0.10) 8.7 9.32)  Total Protein 6.5 - 8.1 g/dL - - 6.3(L)  Total Bilirubin 0.3 - 1.2 mg/dL - - 0.4  Alkaline Phos 38 - 126 U/L - - 55  AST 15 - 41 U/L - - 19  ALT 0 - 44 U/L - - 17    Lipid Panel     Component Value Date/Time   CHOL 211 (H) 08/08/2018 1145   TRIG 102 02/22/2019 1703   HDL 45 08/08/2018 1145   CHOLHDL 4.7 (H) 08/08/2018 1145   LDLCALC 150 (H) 08/08/2018 1145    CBC    Component Value Date/Time   WBC 13.9 (H) 07/09/2020 0124   RBC 4.61 07/09/2020 0124   HGB 10.7 (L) 07/09/2020 0124   HGB 10.3 (L) 12/16/2019 1200   HCT 32.7 (L) 07/09/2020 0124   HCT 33.0 (L) 12/16/2019 1200   PLT 123 (L) 07/09/2020 0124   PLT 274 12/16/2019 1200   MCV 70.9 (L) 07/09/2020 0124   MCV 81 12/16/2019 1200   MCH 23.2 (L) 07/09/2020 0124   MCHC 32.7 07/09/2020 0124   RDW 19.0 (H)  07/09/2020 0124   RDW 17.9 (H) 12/16/2019 1200   LYMPHSABS 2,291 07/06/2020 0826   LYMPHSABS 2.1 12/16/2019 1200   MONOABS 0.3 03/01/2019 0742   EOSABS 58 07/06/2020 0826   EOSABS 0.1 12/16/2019 1200   BASOSABS 17 07/06/2020 0826   BASOSABS 0.0 12/16/2019 1200    Lab Results  Component Value Date   HGBA1C 5.7 11/30/2020    Assessment & Plan:  1. Type 2 diabetes mellitus with obesity ([place]) Controlled with A1c of 5.7 Diet controlled Counseled on Diabetic diet, my plate method, [id number] minutes of moderate intensity exercise/week Blood sugar logs with fasting goals of 80-120 mg/dl, random of less than 180 and in the event of sugars less than 60 mg/dl or greater than 400 mg/dl encouraged to notify the clinic. Advised on the need for annual eye exams, annual foot exams, Pneumonia vaccine. - POCT glycosylated hemoglobin (Hb A1C) - Ambulatory referral to Ophthalmology - LP+Non-HDL Cholesterol; Future - Basic Metabolic Panel; Future - Microalbumin / creatinine urine ratio; Future  2. Chronic atrial fibrillation (HCC) Currently in sinus rhythm On rate control with diltiazem and anticoagulation with Eliquis - apixaban (ELIQUIS) 5 MG TABS tablet; Take 1 tablet (5 mg total) by mouth 2 (two) times daily.  Dispense: 180 tablet; Refill: 1  3. Vision abnormalities - Ambulatory referral to Ophthalmology  4. Essential hypertension She is currently on 2 calcium channel blockers-amlodipine and diltiazem Will substitute amlodipine with HCTZ as she is also having pedal edema Counseled on blood pressure goal of less than 130/80, low-sodium, DASH diet, medication compliance, 150 minutes of moderate intensity exercise per week. Discussed medication compliance, adverse effects. - hydrochlorothiazide (HYDRODIURIL) 25 MG tablet; Take 1 tablet (25 mg total) by mouth daily.  Dispense: 90 tablet; Refill: 1  5. Screening for colon cancer - Fecal occult blood, imunochemical([facility])  6. Need for  immunization against influenza - Flu Vaccine QUAD 23mo+IM (Fluarix, Fluzone & Alfiuria Quad PF)   Meds ordered this encounter  Medications   apixaban (ELIQUIS) 5 MG TABS tablet    Sig: Take 1 tablet (5 mg total) by mouth 2 (two) times daily.    Dispense:  180 tablet    Refill:  1   hydrochlorothiazide (HYDRODIURIL) 25 MG tablet    Sig: Take 1 tablet (25 mg total) by mouth daily.    Dispense:  90 tablet    Refill:  1    Discontinue Amlodipine     Follow-up: Return in about 6 months (around 05/30/2021) for Medical conditions.       Charlott Rakes, MD, FAAFP. [facility], [place]   11/30/2020, 10:53 AM

## 2020-12-02 DIAGNOSIS — R030 Elevated blood-pressure reading, without diagnosis of hypertension: Secondary | ICD-10-CM

## 2020-12-02 LAB — GLUCOSE, POCT (MANUAL RESULT ENTRY): POC Glucose: 87 mg/dl (ref 70–99)

## 2020-12-02 NOTE — Congregational Nurse Program (Signed)
  Dept: [phone]   Congregational Nurse Program Note  Date of Encounter: 12/02/2020  Past Medical History: Past Medical History:  Diagnosis Date   Acute kidney injury (HCC) 03/02/2019   Acute respiratory failure with hypoxia (HCC) 02/22/2019   Atrial fibrillation with RVR (HCC) 02/26/2019   COVID-19 02/22/2019   Dysrhythmia    para. atrial fibrillation   Flu-like symptoms 02/22/2019   Hypertension    Knee osteoarthritis 10/24/2017   Pneumonia     Encounter Details:  CNP Questionnaire - 12/02/20 1128       Questionnaire   Do you give verbal consent to treat you today? Yes    Location Patient Served  [facility]    Visit Setting Church or Organization    Patient Status Immigrant    Insurance Uninsured ([employer]s/Self-Pay)    Insurance Referral Medicaid    Medication Have Medication Insecurities;Provided Medication Assistance    Medical Provider Yes    Screening Referrals Annual Wellness Visit    Medical Referral N/A    Medical Appointment Made Other;N/A    Food N/A    Transportation Need transportation assistance;Provided transportation assistance    Housing/Utilities N/A    Interpersonal Safety N/A    Intervention Educate;Blood pressure;Support;Advocate;Navigate Healthcare System;Counsel;Blood glucose    ED Visit Averted Yes    Life-Saving Intervention Made N/A    Visit Setting Church or Organization           Patient came in for blood pressure and blood sugar check. Health education provided on diet and gentle exercise.  Nicole Cella Nigeria Lasseter RN BSn PCCN  [facility] [phone]-office

## 2020-12-08 ENCOUNTER — Other Ambulatory Visit: Payer: Self-pay

## 2020-12-08 DIAGNOSIS — Z0130 Encounter for examination of blood pressure without abnormal findings: Secondary | ICD-10-CM

## 2020-12-08 LAB — GLUCOSE, POCT (MANUAL RESULT ENTRY): POC Glucose: 98 mg/dl (ref 70–99)

## 2020-12-08 NOTE — Congregational Nurse Program (Signed)
  Dept: [phone]   Congregational Nurse Program Note  Date of Encounter: 12/08/2020  Past Medical History: Past Medical History:  Diagnosis Date   Acute kidney injury (HCC) 03/02/2019   Acute respiratory failure with hypoxia (HCC) 02/22/2019   Atrial fibrillation with RVR (HCC) 02/26/2019   COVID-19 02/22/2019   Dysrhythmia    para. atrial fibrillation   Flu-like symptoms 02/22/2019   Hypertension    Knee osteoarthritis 10/24/2017   Pneumonia     Encounter Details:  CNP Questionnaire - 12/08/20 1159       Questionnaire   Do you give verbal consent to treat you today? Yes    Location Patient Served  [facility]    Visit Setting Church or Organization    Patient Status Immigrant    Insurance Uninsured ([employer]s/Self-Pay)    Insurance Referral Medicaid    Medication Have Medication Insecurities;Provided Medication Assistance    Medical Provider Yes    Screening Referrals Annual Wellness Visit    Medical Referral N/A    Medical Appointment Made Other;N/A    Food N/A    Transportation Need transportation assistance;Provided transportation assistance    Housing/Utilities N/A    Interpersonal Safety N/A    Intervention Educate;Blood pressure;Support;Advocate;Navigate Healthcare System;Counsel;Blood glucose    ED Visit Averted Yes    Life-Saving Intervention Made N/A    Visit Setting Church or Organization            Patient came in for blood pressure/blood sugar and weight monitoring. Education provided on heart healthy diet and exercise. Weight is down 0.5 lbs since last visit.  Nicole Cella Wessie Shanks RN BSn PCCN  [facility] [phone]-cell [phone]-office

## 2020-12-10 ENCOUNTER — Other Ambulatory Visit: Payer: Self-pay | Admitting: Family Medicine

## 2020-12-10 ENCOUNTER — Other Ambulatory Visit: Payer: Self-pay

## 2020-12-10 ENCOUNTER — Ambulatory Visit: Payer: Self-pay | Attending: Family Medicine

## 2020-12-10 DIAGNOSIS — E1169 Type 2 diabetes mellitus with other specified complication: Secondary | ICD-10-CM

## 2020-12-10 DIAGNOSIS — I482 Chronic atrial fibrillation, unspecified: Secondary | ICD-10-CM

## 2020-12-10 MED ORDER — AMLODIPINE BESYLATE 5 MG PO TABS
5.0000 mg | ORAL_TABLET | Freq: Every day | ORAL | 0 refills | Status: DC
  Filled 2020-12-10 – 2021-01-12 (×2): qty 90, 90d supply, fill #0

## 2020-12-10 NOTE — Telephone Encounter (Signed)
Requested Prescriptions  Pending Prescriptions Disp Refills  . amLODipine (NORVASC) 5 MG tablet 90 tablet 0    Sig: Take 1 tablet (5 mg total) by mouth daily.     Cardiovascular:  Calcium Channel Blockers Passed - 12/10/2020  8:48 AM      Passed - Last BP in normal range    BP Readings from Last 1 Encounters:  12/08/20 122/75         Passed - Valid encounter within last 6 months    Recent Outpatient Visits          1 week ago Type 2 diabetes mellitus with obesity (HCC)   [facility] Hoy Register, MD   3 months ago Essential hypertension   [facility] [place], FNP   7 months ago Subconjunctival hemorrhage of right eye   [facility] Hoy Register, MD   8 months ago Conjunctival hemorrhage of right eye   [facility] Storm Frisk, MD   9 months ago Bilateral leg pain   [facility] Hoy Register, MD      Future Appointments            In 5 months Hoy Register, MD [facility]

## 2020-12-11 LAB — LP+NON-HDL CHOLESTEROL
Cholesterol, Total: 155 mg/dL (ref 100–199)
HDL: 64 mg/dL (ref >39)
LDL Chol Calc (NIH): 80 mg/dL (ref 0–99)
Total Non-HDL-Chol (LDL+VLDL): 91 mg/dL (ref 0–129)
Triglycerides: 54 mg/dL (ref 0–149)
VLDL Cholesterol Cal: 11 mg/dL (ref 5–40)

## 2020-12-11 LAB — BASIC METABOLIC PANEL
BUN/Creatinine Ratio: 17 (ref 12–28)
BUN: 20 mg/dL (ref 8–27)
CO2: 27 mmol/L (ref 20–29)
Calcium: 9.1 mg/dL (ref 8.7–10.3)
Chloride: 102 mmol/L (ref 96–106)
Creatinine, Ser: 1.19 mg/dL — ABNORMAL HIGH (ref 0.57–1.00)
Glucose: 81 mg/dL (ref 70–99)
Potassium: 3.7 mmol/L (ref 3.5–5.2)
Sodium: 144 mmol/L (ref 134–144)
eGFR: 49 mL/min/{1.73_m2} — ABNORMAL LOW (ref >59)

## 2020-12-11 LAB — MICROALBUMIN / CREATININE URINE RATIO
Creatinine, Urine: 87.1 mg/dL
Microalb/Creat Ratio: <3 mg/g creat (ref 0–29)
Microalbumin, Urine: <3 ug/mL

## 2020-12-29 NOTE — Congregational Nurse Program (Signed)
  Dept: [phone]   Congregational Nurse Program Note  Date of Encounter: 12/29/2020  Past Medical History: Past Medical History:  Diagnosis Date   Acute kidney injury (HCC) 03/02/2019   Acute respiratory failure with hypoxia (HCC) 02/22/2019   Atrial fibrillation with RVR (HCC) 02/26/2019   COVID-19 02/22/2019   Dysrhythmia    para. atrial fibrillation   Flu-like symptoms 02/22/2019   Hypertension    Knee osteoarthritis 10/24/2017   Pneumonia     Encounter Details: Patient came in for blood pressure check. No other concerns at this time.  Nicole Cella Janequa Kipnis RN BSn PCCN  [facility] [phone]-cell [phone]-office

## 2021-01-12 ENCOUNTER — Other Ambulatory Visit: Payer: Self-pay

## 2021-01-12 ENCOUNTER — Other Ambulatory Visit: Payer: Self-pay | Admitting: Family Medicine

## 2021-01-12 MED ORDER — GABAPENTIN 100 MG PO CAPS
ORAL_CAPSULE | Freq: Three times a day (TID) | ORAL | 1 refills | Status: DC
  Filled 2021-01-12 – 2021-02-15 (×2): qty 90, 30d supply, fill #0

## 2021-01-13 ENCOUNTER — Other Ambulatory Visit: Payer: Self-pay

## 2021-02-15 ENCOUNTER — Other Ambulatory Visit: Payer: Self-pay

## 2021-02-17 NOTE — Congregational Nurse Program (Signed)
°  Dept: [place] Nurse Program Note  Date of Encounter: 02/17/2021  Past Medical History: Past Medical History:  Diagnosis Date   Acute kidney injury ([place]) 03/02/2019   Acute respiratory failure with hypoxia ([place]) 02/22/2019   Atrial fibrillation with RVR ([place]) 02/26/2019   COVID-19 02/22/2019   Dysrhythmia    para. atrial fibrillation   Flu-like symptoms 02/22/2019   Hypertension    Knee osteoarthritis 10/24/2017   Pneumonia     Encounter Details:   Patient arrived for weight check and blood pressure check. Blood pressure 152/77, weight 221lbs. Patient unable to verbalize correct use of medication. Unsure of current medications. Patient will bring in medication on follow up visit for additional education. Patient educated on exercise routines for weight management.   Earlie Server Camillo Quadros RN BSn [place] [id number]

## 2021-03-03 NOTE — Congregational Nurse Program (Signed)
°  Dept: [phone]   Congregational Nurse Program Note  Date of Encounter: 03/03/2021  Past Medical History: Past Medical History:  Diagnosis Date   Acute kidney injury (HCC) 03/02/2019   Acute respiratory failure with hypoxia (HCC) 02/22/2019   Atrial fibrillation with RVR (HCC) 02/26/2019   COVID-19 02/22/2019   Dysrhythmia    para. atrial fibrillation   Flu-like symptoms 02/22/2019   Hypertension    Knee osteoarthritis 10/24/2017   Pneumonia     Encounter Details:  Patient had her blood pressure and weight taken. Vitals were within defined limits. She brought her prescribed medication in to reveal with congregational RN and she was assisted with medication education. She is taking her medication as prescribed. The patient is interested in weight loss. She was educated on diet and exercises.   Nicole Cella Rune Mendez RN BSn PCCN  [facility] [phone]-cell [phone]-office

## 2021-03-08 NOTE — Congregational Nurse Program (Signed)
Patient came to check blood pressure and weight. Monitoring weekly with staff as she tries to lose weight. BP WNL, weight slightly up.  Bluford Kaufmann, RN

## 2021-03-17 ENCOUNTER — Other Ambulatory Visit: Payer: Self-pay

## 2021-03-17 ENCOUNTER — Other Ambulatory Visit: Payer: Self-pay | Admitting: Family

## 2021-03-17 ENCOUNTER — Other Ambulatory Visit: Payer: Self-pay | Admitting: Family Medicine

## 2021-03-17 DIAGNOSIS — I10 Essential (primary) hypertension: Secondary | ICD-10-CM

## 2021-03-17 DIAGNOSIS — E7800 Pure hypercholesterolemia, unspecified: Secondary | ICD-10-CM

## 2021-03-17 MED ORDER — GABAPENTIN 100 MG PO CAPS
ORAL_CAPSULE | Freq: Three times a day (TID) | ORAL | 1 refills | Status: DC
  Filled 2021-03-17: qty 90, 30d supply, fill #0
  Filled 2021-04-21: qty 90, 30d supply, fill #1

## 2021-03-17 MED ORDER — ROSUVASTATIN CALCIUM 10 MG PO TABS
10.0000 mg | ORAL_TABLET | Freq: Every day | ORAL | 1 refills | Status: DC
  Filled 2021-03-17: qty 90, 90d supply, fill #0
  Filled 2021-06-23 (×3): qty 90, 90d supply, fill #1

## 2021-03-17 NOTE — Telephone Encounter (Signed)
Requested Prescriptions  Pending Prescriptions Disp Refills   rosuvastatin (CRESTOR) 10 MG tablet 90 tablet 1    Sig: Take 1 tablet (10 mg total) by mouth daily. To lower cholesterol     Cardiovascular:  Antilipid - Statins 2 Failed - 03/17/2021  8:26 AM      Failed - Cr in normal range and within 360 days    Creatinine  Date Value Ref Range Status  03/13/2018 1.26 (H) 0.44 - 1.00 mg/dL Final   Creatinine, Ser  Date Value Ref Range Status  12/10/2020 1.19 (H) 0.57 - 1.00 mg/dL Final         Failed - Lipid Panel in normal range within the last 12 months    Cholesterol, Total  Date Value Ref Range Status  12/10/2020 155 100 - 199 mg/dL Final   LDL Chol Calc (NIH)  Date Value Ref Range Status  12/10/2020 80 0 - 99 mg/dL Final   HDL  Date Value Ref Range Status  12/10/2020 64 >39 mg/dL Final   Triglycerides  Date Value Ref Range Status  12/10/2020 54 0 - 149 mg/dL Final         Passed - Patient is not pregnant      Passed - Valid encounter within last 12 months    Recent Outpatient Visits          3 months ago Type 2 diabetes mellitus with obesity ([place])   [place], [place]ong, MD   6 months ago Essential hypertension   [place], FNP   11 months ago Subconjunctival hemorrhage of right eye   [place], Enobong, MD   11 months ago Conjunctival hemorrhage of right eye   [place], MD   1 year ago Bilateral leg pain   [place], Enobong, MD      Future Appointments            In 2 months Charlott Rakes, MD [place]   In 3 months [place], Annita Brod, MD [place], [facility]

## 2021-03-17 NOTE — Telephone Encounter (Signed)
Requested Prescriptions  Pending Prescriptions Disp Refills   gabapentin (NEURONTIN) 100 MG capsule 90 capsule 1    Sig: TAKE 1 CAPSULE (100 MG TOTAL) BY MOUTH 3 (THREE) TIMES DAILY.     Neurology: Anticonvulsants - gabapentin Failed - 03/17/2021  8:26 AM      Failed - Cr in normal range and within 360 days    Creatinine  Date Value Ref Range Status  03/13/2018 1.26 (H) 0.44 - 1.00 mg/dL Final   Creatinine, Ser  Date Value Ref Range Status  12/10/2020 1.19 (H) 0.57 - 1.00 mg/dL Final         Passed - Completed PHQ-2 or PHQ-9 in the last 360 days      Passed - Valid encounter within last 12 months    Recent Outpatient Visits          3 months ago Type 2 diabetes mellitus with obesity (HCC)   [facility] Hoy Register, MD   6 months ago Essential hypertension   [facility] [place], FNP   11 months ago Subconjunctival hemorrhage of right eye   [facility] Hoy Register, MD   11 months ago Conjunctival hemorrhage of right eye   [facility] Storm Frisk, MD   1 year ago Bilateral leg pain   [facility] Hoy Register, MD      Future Appointments            In 2 months Hoy Register, MD [facility]   In 3 months [place], Nile Dear, MD [facility] Office, [facility]

## 2021-03-18 ENCOUNTER — Other Ambulatory Visit: Payer: Self-pay

## 2021-03-22 NOTE — Congregational Nurse Program (Signed)
Patient came in to check blood pressure and weight. Discussed portion control and exercise. Patient will recheck next week. No other concerns  [name] [name], Carlynn Spry, RN

## 2021-03-30 NOTE — Congregational Nurse Program (Signed)
°  Dept: [phone]   Congregational Nurse Program Note  Date of Encounter: 03/30/2021  Past Medical History: Past Medical History:  Diagnosis Date   Acute kidney injury (HCC) 03/02/2019   Acute respiratory failure with hypoxia (HCC) 02/22/2019   Atrial fibrillation with RVR (HCC) 02/26/2019   COVID-19 02/22/2019   Dysrhythmia    para. atrial fibrillation   Flu-like symptoms 02/22/2019   Hypertension    Knee osteoarthritis 10/24/2017   Pneumonia     Encounter Details:  CNP Questionnaire - 03/30/21 1247       Questionnaire   Do you give verbal consent to treat you today? Yes    Location Patient Served  [facility]    Visit Setting Church or Organization    Patient Status Refugee    Insurance [facility]    Insurance Referral N/A    Medication N/A    Medical Provider Yes    Screening Referrals N/A    Medical Referral N/A    Medical Appointment Made N/A    Food N/A    Transportation Need transportation assistance    Housing/Utilities N/A    Interpersonal Safety N/A    Intervention Blood pressure;Advocate;Navigate Healthcare System;Case Management;Counsel;Educate;Support    ED Visit Averted N/A    Life-Saving Intervention Made N/A             Patient came in to blood pressure  check and weight monitoring. Re-educated on heart health diet,exercises and to reduce salt intake  Nicole Cella Addalynne Golding RN BSn PCCN  [facility] [phone]-office

## 2021-04-21 ENCOUNTER — Other Ambulatory Visit: Payer: Self-pay

## 2021-04-22 ENCOUNTER — Other Ambulatory Visit: Payer: Self-pay

## 2021-04-28 NOTE — Congregational Nurse Program (Signed)
?  Dept: [phone] ? ? ?Congregational Nurse Program Note ? ?Date of Encounter: 04/28/2021 ? ?Past Medical History: ?Past Medical History:  ?Diagnosis Date  ? Acute kidney injury (HCC) 03/02/2019  ? Acute respiratory failure with hypoxia (HCC) 02/22/2019  ? Atrial fibrillation with RVR (HCC) 02/26/2019  ? COVID-19 02/22/2019  ? Dysrhythmia   ? para. atrial fibrillation  ? Flu-like symptoms 02/22/2019  ? Hypertension   ? Knee osteoarthritis 10/24/2017  ? Pneumonia   ? ? ?Encounter Details: ? CNP Questionnaire - 04/28/21 1129   ? ?  ? Questionnaire  ? Do you give verbal consent to treat you today? Yes   ? Location Patient Served  [facility]   ? Visit Setting Church or Organization   ? Patient Status Refugee   ? Insurance Medicaid   ? Insurance Referral N/A   ? Medication N/A   ? Medical Provider Yes   ? Screening Referrals N/A   ? Medical Referral N/A   ? Medical Appointment Made N/A   ? Food N/A   ? Transportation N/A   ? Housing/Utilities N/A   ? Interpersonal Safety N/A   ? Intervention Blood pressure;Advocate;Navigate Healthcare System;Case Management;Counsel;Educate;Support   ? ED Visit Averted Yes   ? Life-Saving Intervention Made N/A   ? ?  ?  ? ?  ?Patient initially wanted a blood pressure check. Complained of lower back pain. Blood pressure was high (171/90) and educated on avoiding/reducing salt. Advised pain relief options like Tylenol. Patient will bring medications for review next week to assure she is taking them correctly. ? ?Arman Bogus RN BSn PCCN  ?[facility] ?[phone]-cell ?[phone]-office  ? ? ? ?

## 2021-05-03 NOTE — Congregational Nurse Program (Signed)
Patient came in for BP check and weight. Today's weight 217 lb ? ?Bluford Kaufmann, RN  ?

## 2021-05-20 ENCOUNTER — Other Ambulatory Visit: Payer: Self-pay | Admitting: Family Medicine

## 2021-05-20 ENCOUNTER — Other Ambulatory Visit: Payer: Self-pay

## 2021-05-21 ENCOUNTER — Other Ambulatory Visit: Payer: Self-pay

## 2021-05-21 MED ORDER — GABAPENTIN 100 MG PO CAPS
ORAL_CAPSULE | Freq: Three times a day (TID) | ORAL | 1 refills | Status: DC
  Filled 2021-05-21: qty 90, 30d supply, fill #0
  Filled 2021-06-23: qty 90, 30d supply, fill #1

## 2021-05-24 NOTE — Congregational Nurse Program (Signed)
Patient is taking tylenol for pain in her back. Discussed how much to take and advised to be careful not to take too much per day. Discussed gentle stretching and heat for back pain and encouraged her to discuss with doctor at upcoming visit to figure out why she is having back pain. Wrote down next two appointments in May with PCP and Cardiologist. Patient checked weight and stated she had lost a little bit.  ? ?Regino Schultze H ? ?

## 2021-05-25 ENCOUNTER — Other Ambulatory Visit: Payer: Self-pay

## 2021-06-01 ENCOUNTER — Encounter: Payer: Self-pay | Admitting: Family Medicine

## 2021-06-01 ENCOUNTER — Ambulatory Visit: Payer: Self-pay | Attending: Family Medicine | Admitting: Family Medicine

## 2021-06-01 ENCOUNTER — Other Ambulatory Visit: Payer: Self-pay

## 2021-06-01 VITALS — BP 127/79 | HR 54 | Ht 63.0 in | Wt 220.0 lb

## 2021-06-01 DIAGNOSIS — Z1211 Encounter for screening for malignant neoplasm of colon: Secondary | ICD-10-CM

## 2021-06-01 DIAGNOSIS — I10 Essential (primary) hypertension: Secondary | ICD-10-CM

## 2021-06-01 DIAGNOSIS — E669 Obesity, unspecified: Secondary | ICD-10-CM

## 2021-06-01 DIAGNOSIS — E1169 Type 2 diabetes mellitus with other specified complication: Secondary | ICD-10-CM

## 2021-06-01 DIAGNOSIS — I482 Chronic atrial fibrillation, unspecified: Secondary | ICD-10-CM

## 2021-06-01 DIAGNOSIS — E2839 Other primary ovarian failure: Secondary | ICD-10-CM

## 2021-06-01 DIAGNOSIS — H1013 Acute atopic conjunctivitis, bilateral: Secondary | ICD-10-CM

## 2021-06-01 LAB — POCT GLYCOSYLATED HEMOGLOBIN (HGB A1C): HbA1c, POC (controlled diabetic range): 5.7 % (ref 0.0–7.0)

## 2021-06-01 LAB — GLUCOSE, POCT (MANUAL RESULT ENTRY): POC Glucose: 101 mg/dl — AB (ref 70–99)

## 2021-06-01 MED ORDER — OLOPATADINE HCL 0.1 % OP SOLN
1.0000 [drp] | Freq: Two times a day (BID) | OPHTHALMIC | 1 refills | Status: DC
  Filled 2021-06-01: qty 5, 20d supply, fill #0
  Filled 2021-06-23: qty 5, 20d supply, fill #1

## 2021-06-01 MED ORDER — HYDROCHLOROTHIAZIDE 25 MG PO TABS
25.0000 mg | ORAL_TABLET | Freq: Every day | ORAL | 1 refills | Status: DC
  Filled 2021-06-01: qty 90, 90d supply, fill #0
  Filled 2021-06-23: qty 30, 30d supply, fill #0
  Filled 2021-07-28: qty 30, 30d supply, fill #1
  Filled 2021-08-30: qty 30, 30d supply, fill #2
  Filled 2021-10-13: qty 30, 30d supply, fill #3
  Filled 2021-11-16: qty 30, 30d supply, fill #4
  Filled 2021-12-30: qty 30, 30d supply, fill #5

## 2021-06-01 MED ORDER — AMLODIPINE BESYLATE 5 MG PO TABS
5.0000 mg | ORAL_TABLET | Freq: Every day | ORAL | 1 refills | Status: DC
  Filled 2021-06-01: qty 30, 30d supply, fill #0
  Filled 2021-06-23: qty 30, 30d supply, fill #1
  Filled 2021-07-28: qty 30, 30d supply, fill #2
  Filled 2021-08-30: qty 30, 30d supply, fill #3
  Filled 2021-10-13: qty 30, 30d supply, fill #4
  Filled 2021-11-16: qty 30, 30d supply, fill #5

## 2021-06-01 MED ORDER — DILTIAZEM HCL ER COATED BEADS 120 MG PO CP24
ORAL_CAPSULE | Freq: Every day | ORAL | 1 refills | Status: DC
  Filled 2021-06-01: qty 90, fill #0
  Filled 2021-07-28: qty 30, 30d supply, fill #0
  Filled 2021-08-30: qty 30, 30d supply, fill #1
  Filled 2021-10-13: qty 30, 30d supply, fill #2
  Filled 2021-11-16: qty 30, 30d supply, fill #3
  Filled 2021-12-30: qty 30, 30d supply, fill #4

## 2021-06-01 NOTE — Patient Instructions (Addendum)
Allergic Conjunctivitis, Adult  Allergic conjunctivitis is inflammation of the clear membrane (conjunctiva) that covers the white part of your eye and the inner surface of your eyelid. This condition can make your eye red or pink. It can also make your eye feel itchy. This condition cannot be spread from one person to another person (is not contagious). What are the causes? This condition is caused by allergens. These are things that can cause an allergic reaction in some people but not in others. Common allergens include: Outdoor allergens, such as: Pollen, including pollen from grass and weeds. Mold. Car fumes. Indoor allergens, such as: Dust. Smoke. Mold. Proteins in a pet's pee (urine), saliva, or dander. What increases the risk? You are more likely to develop this condition if you have a family history of these things: Allergies. Conditions that you get because of allergens, such as asthma or inflammation of the skin (eczema). What are the signs or symptoms? Symptoms of this condition include eyes that are: Itchy. Red. Watery. Puffy. Your eyes may also: Sting or burn. Have clear fluid draining from them. Have thick mucus coming from them. How is this treated? This condition may be treated with: Cold, wet cloths (cold compresses) to soothe itching and swelling. Washing the face to remove allergens. Eye drops. These may include: Eye drops that block allergies. Eye drops that reduce swelling and irritation. Steroid eye drops if other treatments have not worked. Oral antihistamine medicines. These medicines lessen your allergies. You may need these if eye drops do not help or are difficult to use. Follow these instructions at home: Eye care Place a cool, clean washcloth on your eye for 10-20 minutes. Do this 3-4 times a day. Do not touch or rub your eyes. Do not wear contact lenses until the inflammation is gone. Wear glasses instead. Do not wear eye makeup until the  inflammation is gone. General instructions Try not to be around things that you are allergic to. Take or apply over-the-counter and prescription medicines only as told by your doctor. These include any eye drops. Drink enough fluid to keep your pee pale yellow. Keep all follow-up visits as told by your doctor. This is important. Contact a doctor if: Your symptoms get worse. Your symptoms do not get better with treatment. You have mild eye pain. You are sensitive to light. You have spots or blisters on your eyes. You have pus coming from your eye. You have a fever. Get help right away if: You have redness, swelling, or other symptoms in only one eye. You cannot see well. You have other vision changes. You have very bad eye pain. Summary Allergic conjunctivitis is caused by allergens. It can make your eye red or pink, and it can make your eye feel itchy. This condition cannot be spread from one person to another person (is not contagious). Try not to be around things that you are allergic to. Take or apply over-the-counter and prescription medicines only as told by your doctor. These include any eye drops. Contact your doctor if your symptoms get worse or they do not get better with treatment. This information is not intended to replace advice given to you by your health care provider. Make sure you discuss any questions you have with your health care provider. Document Revised: 12/10/2018 Document Reviewed: 12/10/2018 Elsevier Patient Education  2023 Elsevier Inc.  

## 2021-06-01 NOTE — Progress Notes (Signed)
? ?Subjective:  ?Patient ID: [name] [name], female    DOB: 09-09-1950  Age: 71 y.o. MRN: [id number] ? ?CC: Diabetes ? ? ?HPI ?[name] [name] is a 71 y.o. year old female with a history of Type 2 DM (diet controlled with A1c of 5.7), Hypertension,  A.fib with RVR, s/p b/l TKA ? ?Interval History: ?She complains that she feels like grits in her eyes associated with some itching for the past 6-7 years.  Denies presence of pain in her eyes or blurry vision.  I had previously referred her to Ophthalmology but she never made it there as they did not accept patients without insurance. ? ?Diabetes is controlled with A1c of 5.7 and she is on diet control.  Denies presence of hypoglycemia, numbness in extremities. ?Doing well on her antihypertensive and she has no bleeding from Eliquis which she takes for her A-fib. ?Her knees are doing well and she feels like she has new knees ever since she had knee surgery. ?Past Medical History:  ?Diagnosis Date  ? Acute kidney injury ([place]) 03/02/2019  ? Acute respiratory failure with hypoxia ([place]) 02/22/2019  ? Atrial fibrillation with RVR ([place]) 02/26/2019  ? COVID-19 02/22/2019  ? Dysrhythmia   ? para. atrial fibrillation  ? Flu-like symptoms 02/22/2019  ? Hypertension   ? Knee osteoarthritis 10/24/2017  ? Pneumonia   ? ? ?Past Surgical History:  ?Procedure Laterality Date  ? NO PAST SURGERIES    ? TOTAL KNEE ARTHROPLASTY Right 12/03/2019  ? Procedure: RIGHT TOTAL KNEE ARTHROPLASTY;  Surgeon: Meredith Pel, MD;  Location: [place];  Service: Orthopedics;  Laterality: Right;  ? 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 Procedure: LEFT TOTAL KNEE ARTHROPLASTY-CEMENTED;  Surgeon: Meredith Pel, MD;  Location: [place];  Service: Orthopedics;  Laterality: Left;  ? ? ?Family History  ?Family history unknown: Yes  ? ? ?Social History  ? ?Socioeconomic History  ? Marital status: Married  ?  Spouse name: Not on file  ? Number of children: Not on file  ? Years of education: Not  on file  ? Highest education level: Not on file  ?Occupational History  ? Not on file  ?Tobacco Use  ? Smoking status: Never  ? Smokeless tobacco: Never  ?Vaping Use  ? Vaping Use: Never used  ?Substance and Sexual Activity  ? Alcohol use: Never  ? Drug use: Never  ? Sexual activity: Not Currently  ?  Birth control/protection: Post-menopausal  ?Other Topics Concern  ? Not on file  ?Social History Narrative  ? Not on file  ? ?Social Determinants of Health  ? ?Financial Resource Strain: Not on file  ?Food Insecurity: Not on file  ?Transportation Needs: Not on file  ?Physical Activity: Not on file  ?Stress: Not on file  ?Social Connections: Not on file  ? ? ?No Known Allergies ? ?Outpatient Medications Prior to Visit  ?Medication Sig Dispense Refill  ? apixaban (ELIQUIS) 5 MG TABS tablet Take 1 tablet (5 mg total) by mouth 2 (two) times daily. 180 tablet 1  ? gabapentin (NEURONTIN) 100 MG capsule TAKE 1 CAPSULE (100 MG TOTAL) BY MOUTH 3 (THREE) TIMES DAILY. 90 capsule 1  ? rosuvastatin (CRESTOR) 10 MG tablet Take 1 tablet (10 mg total) by mouth daily. To lower cholesterol 90 tablet 1  ? amLODipine (NORVASC) 5 MG tablet Take 1 tablet (5 mg total) by mouth daily. 90 tablet 0  ? diltiazem (CARDIZEM CD) 120 MG 24 hr capsule TAKE 1 CAPSULE (  120 MG TOTAL) BY MOUTH DAILY. 90 capsule 2  ? hydrochlorothiazide (HYDRODIURIL) 25 MG tablet Take 1 tablet (25 mg total) by mouth daily. 90 tablet 1  ? methocarbamol (ROBAXIN) 500 MG tablet Take 1 tablet (500 mg total) by mouth every 8 (eight) hours as needed for muscle spasms. 30 tablet 0  ? pantoprazole (PROTONIX) 40 MG tablet TAKE 1 TABLET (40 MG TOTAL) BY MOUTH DAILY. TO REDUCE STOMACH ACID 90 tablet 3  ? traMADol (ULTRAM) 50 MG tablet Take 1 tablet (50 mg total) by mouth every 12 (twelve) hours as needed. (Patient not taking: Reported on 11/30/2020) 30 tablet 0  ? ?No facility-administered medications prior to visit.  ? ? ? ?ROS ?Review of Systems  ?Constitutional:  Negative for  activity change, appetite change and fatigue.  ?HENT:  Negative for congestion, sinus pressure and sore throat.   ?Eyes:  Positive for itching. Negative for visual disturbance.  ?Respiratory:  Negative for cough, chest tightness, shortness of breath and wheezing.   ?Cardiovascular:  Negative for chest pain and palpitations.  ?Gastrointestinal:  Negative for abdominal distention, abdominal pain and constipation.  ?Endocrine: Negative for polydipsia.  ?Genitourinary:  Negative for dysuria and frequency.  ?Musculoskeletal:  Negative for arthralgias and back pain.  ?Skin:  Negative for rash.  ?Neurological:  Negative for tremors, light-headedness and numbness.  ?Hematological:  Does not bruise/bleed easily.  ?Psychiatric/Behavioral:  Negative for agitation and behavioral problems.   ? ?Objective:  ?BP 127/79   Pulse (!) 54   Ht 5\' 3"  (1.6 m)   Wt 220 lb (99.8 kg)   LMP  (LMP Unknown)   SpO2 95%   BMI 38.97 kg/m?  ? ? ?  06/01/2021  ?  9:05 AM 05/24/2021  ? 11:42 AM 05/03/2021  ? 11:43 AM  ?BP/Weight  ?Systolic BP [id number]  ?Diastolic BP 79 79 85  ?Wt. (Lbs) 220 216   ?BMI 38.97 kg/m2 38.26 kg/m2   ? ? ? ? ?Physical Exam ?Constitutional:   ?   Appearance: She is well-developed.  ?Cardiovascular:  ?   Rate and Rhythm: Bradycardia present.  ?   Heart sounds: Normal heart sounds. No murmur heard. ?Pulmonary:  ?   Effort: Pulmonary effort is normal.  ?   Breath sounds: Normal breath sounds. No wheezing or rales.  ?Chest:  ?   Chest wall: No tenderness.  ?Abdominal:  ?   General: Bowel sounds are normal. There is no distension.  ?   Palpations: Abdomen is soft. There is no mass.  ?   Tenderness: There is no abdominal tenderness.  ?Musculoskeletal:     ?   General: Normal range of motion.  ?   Right lower leg: No edema.  ?   Left lower leg: No edema.  ?Neurological:  ?   Mental Status: She is alert and oriented to person, place, and time.  ?Psychiatric:     ?   Mood and Affect: Mood normal.  ? ? ? ?  Latest Ref Rng & Units  12/10/2020  ?  8:37 AM 07/01/2020  ?  8:43 AM 12/16/2019  ? 12:00 PM  ?CMP  ?Glucose 70 - 99 mg/dL 81   90   55    ?BUN 8 - 27 mg/dL 20   22   9     ?Creatinine 0.57 - 1.00 mg/dL 1.19   1.19   0.94    ?Sodium 134 - 144 mmol/L 144   138   143    ?  Potassium 3.5 - 5.2 mmol/L 3.7   4.6   4.2    ?Chloride 96 - 106 mmol/L 102   106   106    ?CO2 20 - 29 mmol/L 27   23   21     ?Calcium 8.7 - 10.3 mg/dL 9.1   8.8   8.7    ? ? ?Lipid Panel  ?   ?Component Value Date/Time  ? CHOL 155 12/10/2020 0837  ? TRIG 54 12/10/2020 0837  ? HDL 64 12/10/2020 0837  ? CHOLHDL 4.7 (H) 08/08/2018 1145  ? [place] 80 12/10/2020 0837  ? ? ?CBC ?   ?Component Value Date/Time  ? WBC 13.9 (H) 07/09/2020 0124  ? RBC 4.61 07/09/2020 0124  ? HGB 10.7 (L) 07/09/2020 0124  ? HGB 10.3 (L) 12/16/2019 1200  ? HCT 32.7 (L) 07/09/2020 0124  ? HCT 33.0 (L) 12/16/2019 1200  ? PLT 123 (L) 07/09/2020 0124  ? PLT 274 12/16/2019 1200  ? MCV 70.9 (L) 07/09/2020 0124  ? MCV 81 12/16/2019 1200  ? MCH 23.2 (L) 07/09/2020 0124  ? MCHC 32.7 07/09/2020 0124  ? RDW 19.0 (H) 07/09/2020 0124  ? RDW 17.9 (H) 12/16/2019 1200  ? LYMPHSABS 2,291 07/06/2020 0826  ? LYMPHSABS 2.1 12/16/2019 1200  ? MONOABS 0.3 03/01/2019 0742  ? EOSABS 58 07/06/2020 0826  ? EOSABS 0.1 12/16/2019 1200  ? BASOSABS 17 07/06/2020 0826  ? BASOSABS 0.0 12/16/2019 1200  ? ? ?Lab Results  ?Component Value Date  ? HGBA1C 5.7 06/01/2021  ? ? ?Assessment & Plan:  ?1. Type 2 diabetes mellitus with obesity ([place]) ?Diet controlled with A1c of 5.7 ?Unfortunately she has not had her annual eye exams due to the fact that ophthalmologist around declined excepting her due to lack of medical coverage.  We have discussed community resources for eye exams including [facility], [facility] ?Counseled on Diabetic diet, my plate method, [id number] minutes of moderate intensity exercise/week ?Blood sugar logs with fasting goals of 80-120 mg/dl, random of less than 180 and in the event of sugars less than 60 mg/dl or  greater than 400 mg/dl encouraged to notify the clinic. ?Advised on the need for annual eye exams, annual foot exams, Pneumonia vaccine. ? ?- POCT glycosylated hemoglobin (Hb A1C) ?- POCT glucose (manual entry)

## 2021-06-02 ENCOUNTER — Telehealth: Payer: Self-pay

## 2021-06-02 LAB — CMP14+EGFR
ALT: 10 IU/L (ref 0–32)
AST: 15 IU/L (ref 0–40)
Albumin/Globulin Ratio: 1.2 (ref 1.2–2.2)
Albumin: 4 g/dL (ref 3.7–4.7)
Alkaline Phosphatase: 83 IU/L (ref 44–121)
BUN/Creatinine Ratio: 12 (ref 12–28)
BUN: 14 mg/dL (ref 8–27)
Bilirubin Total: 0.3 mg/dL (ref 0.0–1.2)
CO2: 23 mmol/L (ref 20–29)
Calcium: 8.7 mg/dL (ref 8.7–10.3)
Chloride: 107 mmol/L — ABNORMAL HIGH (ref 96–106)
Creatinine, Ser: 1.15 mg/dL — ABNORMAL HIGH (ref 0.57–1.00)
Globulin, Total: 3.4 g/dL (ref 1.5–4.5)
Glucose: 88 mg/dL (ref 70–99)
Potassium: 4 mmol/L (ref 3.5–5.2)
Sodium: 144 mmol/L (ref 134–144)
Total Protein: 7.4 g/dL (ref 6.0–8.5)
eGFR: 51 mL/min/{1.73_m2} — ABNORMAL LOW (ref >59)

## 2021-06-02 LAB — LP+NON-HDL CHOLESTEROL
Cholesterol, Total: 139 mg/dL (ref 100–199)
HDL: 58 mg/dL (ref >39)
LDL Chol Calc (NIH): 69 mg/dL (ref 0–99)
Total Non-HDL-Chol (LDL+VLDL): 81 mg/dL (ref 0–129)
Triglycerides: 58 mg/dL (ref 0–149)
VLDL Cholesterol Cal: 12 mg/dL (ref 5–40)

## 2021-06-02 NOTE — Telephone Encounter (Signed)
Contacted pt to go over lab results spoke to [employer] and provided results and she doesn't have any questions or concerns  ?

## 2021-06-07 NOTE — Congregational Nurse Program (Signed)
Patient came to check BP and weight.  ? ?[name] [name] H ? ?

## 2021-06-15 ENCOUNTER — Encounter: Payer: Self-pay | Admitting: Cardiovascular Disease

## 2021-06-15 ENCOUNTER — Ambulatory Visit (INDEPENDENT_AMBULATORY_CARE_PROVIDER_SITE_OTHER): Payer: Self-pay | Admitting: Cardiovascular Disease

## 2021-06-15 VITALS — BP 128/78 | HR 64 | Ht 63.0 in | Wt 218.2 lb

## 2021-06-15 DIAGNOSIS — I480 Paroxysmal atrial fibrillation: Secondary | ICD-10-CM

## 2021-06-15 NOTE — Progress Notes (Signed)
? ?Chief Complaint  ?Patient presents with  ? Follow-up  ?  PAF  ?  ?History of Present Illness: 71 yo female with history of paroxysmal atrial fibrillation, HTN, CKD and arthritis who is here today for follow up. She was seen in the hospital in January 2021 by Dr. Radford Pax for new onset atrial fibrillation with RVR. She was admitted with Covid 19 related pneumonia. She was rate controlled with Cardizem and was started on Eliquis. Echo January 2021 with LVEF=60-65%. Mild MR.  ? ?She is here today for follow up. The patient denies any chest pain, dyspnea, palpitations, lower extremity edema, orthopnea, PND, dizziness, near syncope or syncope.  ? ?Primary Care Physician: Charlott Rakes, MD  ?Primary Cardiologist: Fransico Him ? ?Past Medical History:  ?Diagnosis Date  ? Acute kidney injury ([place]) 03/02/2019  ? Acute respiratory failure with hypoxia ([place]) 02/22/2019  ? Atrial fibrillation with RVR ([place]) 02/26/2019  ? COVID-19 02/22/2019  ? Dysrhythmia   ? para. atrial fibrillation  ? Flu-like symptoms 02/22/2019  ? Hypertension   ? Knee osteoarthritis 10/24/2017  ? Pneumonia   ? ? ?Past Surgical History:  ?Procedure Laterality Date  ? NO PAST SURGERIES    ? TOTAL KNEE ARTHROPLASTY Right 12/03/2019  ? Procedure: RIGHT TOTAL KNEE ARTHROPLASTY;  Surgeon: Meredith Pel, MD;  Location: [facility];  Service: Orthopedics;  Laterality: Right;  ? 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 Procedure: LEFT TOTAL KNEE ARTHROPLASTY-CEMENTED;  Surgeon: Meredith Pel, MD;  Location: [facility];  Service: Orthopedics;  Laterality: Left;  ? ?Current Outpatient Medications  ?Medication Sig Dispense Refill  ? amLODipine (NORVASC) 5 MG tablet Take 1 tablet (5 mg total) by mouth daily. 90 tablet 1  ? apixaban (ELIQUIS) 5 MG TABS tablet Take 1 tablet (5 mg total) by mouth 2 (two) times daily. 180 tablet 1  ? diltiazem (CARDIZEM CD) 120 MG 24 hr capsule TAKE 1 CAPSULE (120 MG TOTAL) BY MOUTH DAILY. 90 capsule 1  ? gabapentin (NEURONTIN) 100 MG  capsule TAKE 1 CAPSULE (100 MG TOTAL) BY MOUTH 3 (THREE) TIMES DAILY. 90 capsule 1  ? hydrochlorothiazide (HYDRODIURIL) 25 MG tablet Take 1 tablet (25 mg total) by mouth daily. 90 tablet 1  ? olopatadine (PATANOL) 0.1 % ophthalmic solution Place 1 drop into both eyes 2 (two) times daily. 5 mL 1  ? rosuvastatin (CRESTOR) 10 MG tablet Take 1 tablet (10 mg total) by mouth daily. To lower cholesterol 90 tablet 1  ? ?No current facility-administered medications for this visit.  ? ? ?No Known Allergies ? ?Social History  ? ?Socioeconomic History  ? Marital status: Married  ?  Spouse name: Not on file  ? Number of children: Not on file  ? Years of education: Not on file  ? Highest education level: Not on file  ?Occupational History  ? Not on file  ?Tobacco Use  ? Smoking status: Never  ? Smokeless tobacco: Never  ?Vaping Use  ? Vaping Use: Never used  ?Substance and Sexual Activity  ? Alcohol use: Never  ? Drug use: Never  ? Sexual activity: Not Currently  ?  Birth control/protection: Post-menopausal  ?Other Topics Concern  ? Not on file  ?Social History Narrative  ? Not on file  ? ?Social Determinants of Health  ? ?Financial Resource Strain: Not on file  ?Food Insecurity: Not on file  ?Transportation Needs: Not on file  ?Physical Activity: Not on file  ?Stress: Not on file  ?Social Connections: Not on  file  ?Intimate Partner Violence: Not on file  ? ? ?Family History  ?Family history unknown: Yes  ? ? ?Review of Systems:  As stated in the HPI and otherwise negative.  ? ?BP 128/78   Pulse 64   Ht 5\' 3"  (1.6 m)   Wt 218 lb 3.2 oz (99 kg)   LMP  (LMP Unknown)   BMI 38.65 kg/m?  ? ?Physical Examination: ?General: Well developed, well nourished, NAD  ?HEENT: OP clear, mucus membranes moist  ?SKIN: warm, dry. No rashes. ?Neuro: No focal deficits  ?Musculoskeletal: Muscle strength 5/5 all ext  ?Psychiatric: Mood and affect normal  ?Neck: No JVD, no carotid bruits, no thyromegaly, no lymphadenopathy.  ?Lungs:Clear  bilaterally, no wheezes, rhonci, crackles ?Cardiovascular: Regular rate and rhythm. No murmurs, gallops or rubs. ?Abdomen:Soft. Bowel sounds present. Non-tender.  ?Extremities: No lower extremity edema. Pulses are 2 + in the bilateral DP/PT. ? ?EKG:  EKG is ordered today. ?The ekg ordered today demonstrates sinus ? ?Recent Labs: ?07/09/2020: Hemoglobin 10.7; Platelets 123 ?06/01/2021: ALT 10; BUN 14; Creatinine, Ser 1.15; Potassium 4.0; Sodium 144  ? ?Lipid Panel ?   ?Component Value Date/Time  ? CHOL 139 06/01/2021 0953  ? TRIG 58 06/01/2021 0953  ? HDL 58 06/01/2021 0953  ? CHOLHDL 4.7 (H) 08/08/2018 1145  ? [place] 69 06/01/2021 0953  ? ?  ?Wt Readings from Last 3 Encounters:  ?06/15/21 218 lb 3.2 oz (99 kg)  ?06/07/21 219 lb (99.3 kg)  ?06/01/21 220 lb (99.8 kg)  ?  ? ?Assessment and Plan:  ? ?1. Paroxysmal atrial fibrillation: She is in sinus today. CHADS VASC score 3. Continue Eliquis and Cardizem.  ? ?2. Chronic diastolic CHF: Normal LV function by echo in 2021. Weight is stable. No volume overload on exam.  ? ?Current medicines are reviewed at length with the patient today.  The patient does not have concerns regarding medicines. ? ?The following changes have been made:  no change ? ?Labs/ tests ordered today include:  ? ?Orders Placed This Encounter  ?Procedures  ? EKG 12-Lead  ? ? ? ?Disposition:   FU with Dr. Radford Pax in 6 months.  ? ? ?Signed, ?Lauree Chandler, MD ?06/15/2021 4:46 PM    ?[facility] ?[address], [place], [place]  [address] ?Phone: [phone]; Fax: [phone]  ? ? ?

## 2021-06-15 NOTE — Patient Instructions (Signed)
Medication Instructions:  No changes *If you need a refill on your cardiac medications before your next appointment, please call your pharmacy*   Lab Work: none If you have labs (blood work) drawn today and your tests are completely normal, you will receive your results only by: MyChart Message (if you have MyChart) OR A paper copy in the mail If you have any lab test that is abnormal or we need to change your treatment, we will call you to review the results.   Testing/Procedures: none   Follow-Up: At [facility], you and your health needs are our priority.  As part of our continuing mission to provide you with exceptional heart care, we have created designated Provider Care Teams.  These Care Teams include your primary Cardiologist (physician) and Advanced Practice Providers (APPs -  Physician Assistants and Nurse Practitioners) who all work together to provide you with the care you need, when you need it.   Your next appointment:   12 month(s)  The format for your next appointment:   In Person  Provider:   Christopher McAlhany, MD     Other Instructions   Important Information About Sugar       

## 2021-06-21 NOTE — Congregational Nurse Program (Signed)
[name] [name] came to check blood pressure and weight. She is learning to ask for blood pressure check in English.

## 2021-06-23 ENCOUNTER — Other Ambulatory Visit: Payer: Self-pay

## 2021-06-24 ENCOUNTER — Other Ambulatory Visit: Payer: Self-pay

## 2021-07-08 ENCOUNTER — Other Ambulatory Visit: Payer: Self-pay

## 2021-07-23 ENCOUNTER — Other Ambulatory Visit: Payer: Self-pay

## 2021-07-28 ENCOUNTER — Other Ambulatory Visit: Payer: Self-pay | Admitting: Family Medicine

## 2021-07-28 ENCOUNTER — Other Ambulatory Visit: Payer: Self-pay

## 2021-07-28 MED ORDER — GABAPENTIN 100 MG PO CAPS
ORAL_CAPSULE | Freq: Three times a day (TID) | ORAL | 1 refills | Status: DC
  Filled 2021-07-28: qty 90, 30d supply, fill #0
  Filled 2021-08-30: qty 90, 30d supply, fill #1

## 2021-07-28 NOTE — Telephone Encounter (Signed)
Requested Prescriptions  Pending Prescriptions Disp Refills  . gabapentin (NEURONTIN) 100 MG capsule 180 capsule 1    Sig: TAKE 1 CAPSULE (100 MG TOTAL) BY MOUTH 3 (THREE) TIMES DAILY.     Neurology: Anticonvulsants - gabapentin Failed - 07/28/2021 10:32 AM      Failed - Cr in normal range and within 360 days    Creatinine  Date Value Ref Range Status  03/13/2018 1.26 (H) 0.44 - 1.00 mg/dL Final   Creatinine, Ser  Date Value Ref Range Status  06/01/2021 1.15 (H) 0.57 - 1.00 mg/dL Final         Passed - Completed PHQ-2 or PHQ-9 in the last 360 days      Passed - Valid encounter within last 12 months    Recent Outpatient Visits          1 month ago Type 2 diabetes mellitus with obesity (HCC)   [facility] Sl[place], [place], MD   8 months ago Type 2 diabetes mellitus with obesity (HCC)   [facility] Hoy Register, MD   11 months ago Essential hypertension   [facility] [place], FNP   1 year ago Subconjunctival hemorrhage of right eye   [facility] Hoy Register, MD   1 year ago Conjunctival hemorrhage of right eye   [facility] Storm Frisk, MD      Future Appointments            In 4 months Hoy Register, MD [facility]

## 2021-07-30 ENCOUNTER — Ambulatory Visit: Payer: Self-pay | Admitting: *Deleted

## 2021-07-30 NOTE — Telephone Encounter (Signed)
Summary: swelling feet and legs   Pt's caregiver called saying patient is experiencing swelling in her legs and feet and it hurts to walk.   Please call care giver back  [phone]       Called patient via interpreter Riley Lam ID # [id number] to review sx feet and leg swelling. No answer, LVMTCB [phone].

## 2021-07-30 NOTE — Telephone Encounter (Signed)
  Chief Complaint: feet and leg swelling pain with walking . Interpreter Tobi Bastos ID [phone] assisted with call. Called primary # listed and spoke with Early Katrinka Blazing patient's sponsor. Another # [phone] given.  Symptoms: feet and leg swelling , eyes swelling, hurts  to walk. Sharp pain in chest at times. Right shoulder pain when lifting arm. Requesting refills  Frequency: last night  Pertinent Negatives: Patient denies difficulty breathing, no fever,  Disposition: [x] ED /[] Urgent Care (no appt availability in office) / [] Appointment(In office/virtual)/ []  [facility]/ [] Home Care/ [] Refused Recommended Disposition /[] [facility]/ []  Follow-up with PCP Additional Notes:   Recommended ED. Unsure if patient will go . Please advise and patient would like a call back regarding appt. Requesting medication refills. Recommended patient to call pharmacy due to additional refills noted.       Reason for Disposition  [1] MODERATE leg swelling (e.g., swelling extends up to knees) AND [2] new-onset or worsening  Answer Assessment - Initial Assessment Questions 1. ONSET: "When did the swelling start?" (e.g., minutes, hours, days)     Last night  2. LOCATION: "What part of the leg is swollen?"  "Are both legs swollen or just one leg?"     Feet and legs. Mostly feet  3. SEVERITY: "How bad is the swelling?" (e.g., localized; mild, moderate, severe)  - Localized - small area of swelling localized to one leg  - MILD pedal edema - swelling limited to foot and ankle, pitting edema < 1/4 inch (6 mm) deep, rest and elevation eliminate most or all swelling  - MODERATE edema - swelling of lower leg to knee, pitting edema > 1/4 inch (6 mm) deep, rest and elevation only partially reduce swelling  - SEVERE edema - swelling extends above knee, facial or hand swelling present      Hurts to walk swelling in eyes  4. REDNESS: "Does the swelling look red or infected?"     no 5. PAIN: "Is the  swelling painful to touch?" If Yes, ask: "How painful is it?"   (Scale 1-10; mild, moderate or severe)     Hurts to walk on feet  6. FEVER: "Do you have a fever?" If Yes, ask: "What is it, how was it measured, and when did it start?"      No  7. CAUSE: "What do you think is causing the leg swelling?"     Na  8. MEDICAL HISTORY: "Do you have a history of heart failure, kidney disease, liver failure, or cancer?"     See hx  9. RECURRENT SYMPTOM: "Have you had leg swelling before?" If Yes, ask: "When was the last time?" "What happened that time?"     Yes 2021 and out of medication  10. OTHER SYMPTOMS: "Do you have any other symptoms?" (e.g., chest pain, difficulty breathing)       Not feeling well. Right shoulder pain when lifting arm  pain in "heart" at times  11. PREGNANCY: "Is there any chance you are pregnant?" "When was your last menstrual period?"       na  Protocols used: Leg Swelling and Edema-A-AH

## 2021-07-30 NOTE — Telephone Encounter (Signed)
Using [name] interpreter Joni Reining [phone], left message to call back about symptoms.

## 2021-08-06 ENCOUNTER — Ambulatory Visit: Payer: Self-pay

## 2021-08-06 NOTE — Telephone Encounter (Signed)
Pt friend is calling to report that the pts L & R are swollen with with pain to walk. Please advise     Chief Complaint: Swelling to both feet. Better today Symptoms: Pain with walking Frequency: Had this 2 weeks ago Pertinent Negatives: Patient denies chest pain or SOB. Disposition: [] ED /[] Urgent Care (no appt availability in office) / [] Appointment(In office/virtual)/ []  [facility]/ [] Home Care/ [] Refused Recommended Disposition /[] [facility]/ [x]  Follow-up with PCP Additional Notes: Asking to be worked in. Please advise   Answer Assessment - Initial Assessment Questions 1. ONSET: "When did the swelling start?" (e.g., minutes, hours, days)     2 days 2. LOCATION: "What part of the leg is swollen?"  "Are both legs swollen or just one leg?"     Both feet 3. SEVERITY: "How bad is the swelling?" (e.g., localized; mild, moderate, severe)  - Localized - small area of swelling localized to one leg  - MILD pedal edema - swelling limited to foot and ankle, pitting edema < 1/4 inch (6 mm) deep, rest and elevation eliminate most or all swelling  - MODERATE edema - swelling of lower leg to knee, pitting edema > 1/4 inch (6 mm) deep, rest and elevation only partially reduce swelling  - SEVERE edema - swelling extends above knee, facial or hand swelling present      Mild 4. REDNESS: "Does the swelling look red or infected?"     No 5. PAIN: "Is the swelling painful to touch?" If Yes, ask: "How painful is it?"   (Scale 1-10; mild, moderate or severe)     Moderate 6. FEVER: "Do you have a fever?" If Yes, ask: "What is it, how was it measured, and when did it start?"      No 7. CAUSE: "What do you think is causing the leg swelling?"     Unsure 8. MEDICAL HISTORY: "Do you have a history of heart failure, kidney disease, liver failure, or cancer?"     No 9. RECURRENT SYMPTOM: "Have you had leg swelling before?" If Yes, ask: "When was the last time?" "What happened that  time?"     No 10. OTHER SYMPTOMS: "Do you have any other symptoms?" (e.g., chest pain, difficulty breathing)       No 11. PREGNANCY: "Is there any chance you are pregnant?" "When was your last menstrual period?"       No  Protocols used: Leg Swelling and Edema-A-AH

## 2021-08-30 ENCOUNTER — Other Ambulatory Visit: Payer: Self-pay | Admitting: Family Medicine

## 2021-08-30 ENCOUNTER — Other Ambulatory Visit: Payer: Self-pay

## 2021-08-30 DIAGNOSIS — I10 Essential (primary) hypertension: Secondary | ICD-10-CM

## 2021-08-30 DIAGNOSIS — E7800 Pure hypercholesterolemia, unspecified: Secondary | ICD-10-CM

## 2021-08-30 MED ORDER — ROSUVASTATIN CALCIUM 10 MG PO TABS
10.0000 mg | ORAL_TABLET | Freq: Every day | ORAL | 1 refills | Status: DC
  Filled 2021-08-30: qty 90, 90d supply, fill #0
  Filled 2021-11-24: qty 90, 90d supply, fill #1

## 2021-08-31 ENCOUNTER — Other Ambulatory Visit: Payer: Self-pay

## 2021-09-06 NOTE — Congregational Nurse Program (Signed)
Patient complaining of BL foot swelling and pain with ambulation. Reports occasional pain in sternal area radiating to L shoulder but not experiencing this pain at this time. Placed call to PCP for follow up appointment. BLE feet and ankles were 1+ pitting edema, pulse regular, BP 106/57  PCP unable to see patient within this week. Advised patient to go to [facility] clinic 09/07/21 at [address] to be seen by clinician for ongoing problems with BLE.   [name] [name]

## 2021-09-07 ENCOUNTER — Telehealth: Payer: Self-pay | Admitting: Emergency Medicine

## 2021-09-07 NOTE — Telephone Encounter (Signed)
Patient has been given earlier appointment and nurse has been informed and she states that she will let the patient know.

## 2021-09-07 NOTE — Telephone Encounter (Signed)
Copied from CRM [id number]. Topic: Appointment Scheduling - Scheduling Inquiry for Clinic >> Sep 06, 2021  9:42 AM Everette C wrote: Reason for CRM: Skeet Simmer a Congregational Nurse with [facility] has called to request an earlier appointment for the patient to address their ongoing medical concerns  Skeet Simmer is concerned with the length of time remaining until the patient is seen and would like an earlier appt   Please contact further to advise scheduling >> Sep 06, 2021  9:46 AM Everette C wrote: Skeet Simmer would like to speak with someone by 12:00 PM today 09/06/21

## 2021-09-20 NOTE — Congregational Nurse Program (Signed)
Notified patient and gave written copy of upcoming appointment on Wednesday 8/23 to address concerns

## 2021-09-22 ENCOUNTER — Other Ambulatory Visit: Payer: Self-pay

## 2021-09-22 ENCOUNTER — Ambulatory Visit: Payer: Self-pay | Attending: Family Medicine | Admitting: Family Medicine

## 2021-09-22 ENCOUNTER — Encounter: Payer: Self-pay | Admitting: Family Medicine

## 2021-09-22 VITALS — BP 124/71 | HR 62 | Temp 98.8°F | Resp 16 | Ht 64.0 in | Wt 212.0 lb

## 2021-09-22 DIAGNOSIS — E1142 Type 2 diabetes mellitus with diabetic polyneuropathy: Secondary | ICD-10-CM

## 2021-09-22 DIAGNOSIS — G8929 Other chronic pain: Secondary | ICD-10-CM

## 2021-09-22 DIAGNOSIS — M545 Low back pain, unspecified: Secondary | ICD-10-CM

## 2021-09-22 DIAGNOSIS — B353 Tinea pedis: Secondary | ICD-10-CM

## 2021-09-22 DIAGNOSIS — E1169 Type 2 diabetes mellitus with other specified complication: Secondary | ICD-10-CM

## 2021-09-22 DIAGNOSIS — H539 Unspecified visual disturbance: Secondary | ICD-10-CM

## 2021-09-22 DIAGNOSIS — H6123 Impacted cerumen, bilateral: Secondary | ICD-10-CM

## 2021-09-22 DIAGNOSIS — E669 Obesity, unspecified: Secondary | ICD-10-CM

## 2021-09-22 DIAGNOSIS — H918X3 Other specified hearing loss, bilateral: Secondary | ICD-10-CM

## 2021-09-22 LAB — GLUCOSE, POCT (MANUAL RESULT ENTRY): POC Glucose: 88 mg/dl (ref 70–99)

## 2021-09-22 LAB — POCT GLYCOSYLATED HEMOGLOBIN (HGB A1C): Hemoglobin A1C: 5.6 % (ref 4.0–5.6)

## 2021-09-22 MED ORDER — DICLOFENAC SODIUM 1 % EX GEL
4.0000 g | Freq: Four times a day (QID) | CUTANEOUS | 1 refills | Status: DC
  Filled 2021-09-22: qty 100, 6d supply, fill #0
  Filled 2022-04-20: qty 100, 6d supply, fill #1

## 2021-09-22 MED ORDER — CLOTRIMAZOLE 1 % EX CREA
1.0000 | TOPICAL_CREAM | Freq: Two times a day (BID) | CUTANEOUS | 1 refills | Status: DC
  Filled 2021-09-22: qty 60, 30d supply, fill #0
  Filled 2022-02-10: qty 60, 30d supply, fill #1

## 2021-09-22 MED ORDER — GABAPENTIN 100 MG PO CAPS
200.0000 mg | ORAL_CAPSULE | Freq: Three times a day (TID) | ORAL | 6 refills | Status: DC
  Filled 2021-09-22: qty 180, 30d supply, fill #0
  Filled 2021-11-16: qty 180, 30d supply, fill #1
  Filled 2021-12-30 – 2022-01-10 (×2): qty 180, 30d supply, fill #2
  Filled 2022-02-10: qty 180, 30d supply, fill #3

## 2021-09-22 NOTE — Patient Instructions (Signed)
Neuropathic Pain Neuropathic pain is pain caused by damage to the nerves that are responsible for certain sensations in your body (sensory nerves). Neuropathic pain can make you more sensitive to pain. Even a minor sensation can feel very painful. This is usually a long-term (chronic) condition that can be difficult to treat. The type of pain differs from person to person. It may: Start suddenly (acute), or it may develop slowly and become chronic. Come and go as damaged nerves heal, or it may stay at the same level for years. Cause emotional distress, loss of sleep, and a lower quality of life. What are the causes? The most common cause of this condition is diabetes. Many other diseases and conditions can also cause neuropathic pain. Causes of neuropathic pain can be classified as: Toxic. This is caused by medicines and chemicals. The most common causes of toxic neuropathic pain is damage from medicines that kill cancer cells (chemotherapy) or alcohol abuse. Metabolic. This can be caused by: Diabetes. Lack of vitamins like B12. Traumatic. Any injury that cuts, crushes, or stretches a nerve can cause damage and pain. Compression-related. If a sensory nerve gets trapped or compressed for a long period of time, the blood supply to the nerve can be cut off. Vascular. Many blood vessel diseases can cause neuropathic pain by decreasing blood supply and oxygen to nerves. Autoimmune. This type of pain results from diseases in which the body's defense system (immune system) mistakenly attacks sensory nerves. Examples of autoimmune diseases that can cause neuropathic pain include lupus and multiple sclerosis. Infectious. Many types of viral infections can damage sensory nerves and cause pain. Shingles infection is a common cause of this type of pain. Inherited. Neuropathic pain can be a symptom of many diseases that are passed down through families (genetic). What increases the risk? You are more likely to  develop this condition if: You have diabetes. You smoke. You drink too much alcohol. You are taking certain medicines, including chemotherapy or medicines that treat immune system disorders. What are the signs or symptoms? The main symptom is pain. Neuropathic pain is often described as: Burning. Shock-like. Stinging. Hot or cold. Itching. How is this diagnosed? No single test can diagnose neuropathic pain. It is diagnosed based on: A physical exam and your symptoms. Your health care provider will ask you about your pain. You may be asked to use a pain scale to describe how bad your pain is. Tests. These may be done to see if you have a cause and location of any nerve damage. They include: Nerve conduction studies and electromyography to test how well nerve signals travel through your nerves and muscles (electrodiagnostic testing). Skin biopsy to evaluate for small fiber neuropathy. Imaging studies, such as: X-rays. CT scan. MRI. How is this treated? Treatment for neuropathic pain may change over time. You may need to try different treatment options or a combination of treatments. Some options include: Treating the underlying cause of the neuropathy, such as diabetes, kidney disease, or vitamin deficiencies. Stopping medicines that can cause neuropathy, such as chemotherapy. Medicine to relieve pain. Medicines may include: Prescription or over-the-counter pain medicine. Anti-seizure medicine. Antidepressant medicines. Pain-relieving patches or creams that are applied to painful areas of skin. A medicine to numb the area (local anesthetic), which can be injected as a nerve block. Transcutaneous nerve stimulation. This uses electrical currents to block painful nerve signals. The treatment is painless. Alternative treatments, such as: Acupuncture. Meditation. Massage. Occupational or physical therapy. Pain management programs. Counseling. Follow   these instructions at  home: Medicines  Take over-the-counter and prescription medicines only as told by your health care provider. Ask your health care provider if the medicine prescribed to you: Requires you to avoid driving or using machinery. Can cause constipation. You may need to take these actions to prevent or treat constipation: Drink enough fluid to keep your urine pale yellow. Take over-the-counter or prescription medicines. Eat foods that are high in fiber, such as beans, whole grains, and fresh fruits and vegetables. Limit foods that are high in fat and processed sugars, such as fried or sweet foods. Lifestyle  Have a good support system at home. Consider joining a chronic pain support group. Do not use any products that contain nicotine or tobacco. These products include cigarettes, chewing tobacco, and vaping devices, such as e-cigarettes. If you need help quitting, ask your health care provider. Do not drink alcohol. General instructions Learn as much as you can about your condition. Work closely with all your health care providers to find the treatment plan that works best for you. Ask your health care provider what activities are safe for you. Keep all follow-up visits. This is important. Contact a health care provider if: Your pain treatments are not working. You are having side effects from your medicines. You are struggling with tiredness (fatigue), mood changes, depression, or anxiety. Get help right away if: You have thoughts of hurting yourself. Get help right away if you feel like you may hurt yourself or others, or have thoughts about taking your own life. Go to your nearest emergency room or: Call 911. Call the National Suicide Prevention Lifeline at 1-800-273-8255 or 988. This is open 24 hours a day. Text the Crisis Text Line at 741741. Summary Neuropathic pain is pain caused by damage to the nerves that are responsible for certain sensations in your body (sensory  nerves). Neuropathic pain may come and go as damaged nerves heal, or it may stay at the same level for years. Neuropathic pain is usually a long-term condition that can be difficult to treat. Consider joining a chronic pain support group. This information is not intended to replace advice given to you by your health care provider. Make sure you discuss any questions you have with your health care provider. Document Revised: 09/14/2020 Document Reviewed: 09/14/2020 Elsevier Patient Education  2023 [employer]  

## 2021-09-22 NOTE — Progress Notes (Signed)
Subjective:  Patient ID: [name] [name], female    DOB: 11/03/1950  Age: 71 y.o. MRN: [id number]  CC: Follow-up (Foot pain, Blurry vision, and hard of hearing)   HPI [name] [name] is a 71 y.o. year old female with a history of Type 2 DM (diet controlled with A1c of 5.5), Hypertension,  A.fib with RVR, s/p b/l TKA  Interval History: In 05/2021 she had a visit with her cardiologist for follow-up of her paroxysmal A-fib and she is currently on her Cardizem and Eliquis.  Today she complains of foot pain, blurry vision, difficulty hearing. She states her feet have been swelling and the skin of her feet peeling. When she places her feet on the floor she has pain in the soles of her feet and also dorsum Hearing loss is worse in her left She also complains of low back pain which does not radiate.  Pain is chronic and rated as moderate.  Worse with prolonged standing.  Diabetes is diet controlled Past Medical History:  Diagnosis Date   Acute kidney injury ([place]) 03/02/2019   Acute respiratory failure with hypoxia (HCC) 02/22/2019   Atrial fibrillation with RVR ([place]) 02/26/2019   COVID-19 02/22/2019   Dysrhythmia    para. atrial fibrillation   Flu-like symptoms 02/22/2019   Hypertension    Knee osteoarthritis 10/24/2017   Pneumonia     Past Surgical History:  Procedure Laterality Date   NO PAST SURGERIES     TOTAL KNEE ARTHROPLASTY Right 12/03/2019   Procedure: RIGHT TOTAL KNEE ARTHROPLASTY;  Surgeon: Meredith Pel, MD;  Location: [facility];  Service: Orthopedics;  Laterality: Right;   TOTAL KNEE ARTHROPLASTY Left 07/07/2020   Procedure: LEFT TOTAL KNEE ARTHROPLASTY-CEMENTED;  Surgeon: Meredith Pel, MD;  Location: [facility];  Service: Orthopedics;  Laterality: Left;    Family History  Family history unknown: Yes    Social History   Socioeconomic History   Marital status: Married    Spouse name: Not on file   Number of children: Not on file   Years of  education: Not on file   Highest education level: Not on file  Occupational History   Not on file  Tobacco Use   Smoking status: Never   Smokeless tobacco: Never  Vaping Use   Vaping Use: Never used  Substance and Sexual Activity   Alcohol use: Never   Drug use: Never   Sexual activity: Not Currently    Birth control/protection: Post-menopausal  Other Topics Concern   Not on file  Social History Narrative   Not on file   Social Determinants of Health   Financial Resource Strain: Not on file  Food Insecurity: Not on file  Transportation Needs: No Transportation Needs (10/15/2019)   PRAPARE - Hydrologist (Medical): No    Lack of Transportation (Non-Medical): No  Physical Activity: Not on file  Stress: Not on file  Social Connections: Not on file    No Known Allergies  Outpatient Medications Prior to Visit  Medication Sig Dispense Refill   amLODipine (NORVASC) 5 MG tablet Take 1 tablet (5 mg total) by mouth daily. 90 tablet 1   apixaban (ELIQUIS) 5 MG TABS tablet Take 1 tablet (5 mg total) by mouth 2 (two) times daily. 180 tablet 1   diltiazem (CARDIZEM CD) 120 MG 24 hr capsule TAKE 1 CAPSULE (120 MG TOTAL) BY MOUTH DAILY. 90 capsule 1   hydrochlorothiazide (HYDRODIURIL) 25 MG tablet Take 1 tablet (25  mg total) by mouth daily. 90 tablet 1   olopatadine (PATANOL) 0.1 % ophthalmic solution Place 1 drop into both eyes 2 (two) times daily. 5 mL 1   rosuvastatin (CRESTOR) 10 MG tablet Take 1 tablet (10 mg total) by mouth daily. To lower cholesterol 90 tablet 1   gabapentin (NEURONTIN) 100 MG capsule TAKE 1 CAPSULE (100 MG TOTAL) BY MOUTH 3 (THREE) TIMES DAILY. 180 capsule 1   No facility-administered medications prior to visit.     ROS Review of Systems  Constitutional:  Negative for activity change and appetite change.  HENT:  Positive for hearing loss. Negative for sinus pressure and sore throat.   Eyes:  Positive for visual disturbance.   Respiratory:  Negative for chest tightness, shortness of breath and wheezing.   Cardiovascular:  Negative for chest pain and palpitations.  Gastrointestinal:  Negative for abdominal distention, abdominal pain and constipation.  Genitourinary: Negative.   Musculoskeletal: see HPI Psychiatric/Behavioral:  Negative for behavioral problems and dysphoric mood.     Objective:  BP 124/71 (BP Location: Left Arm, Patient Position: Sitting, Cuff Size: Large)   Pulse 62   Temp 98.8 F (37.1 C)   Resp 16   Ht 5\' 4"  (1.626 m)   Wt 212 lb (96.2 kg)   LMP  (LMP Unknown)   SpO2 96%   BMI 36.39 kg/m      09/22/2021    1:57 PM 09/06/2021    9:49 AM 06/21/2021   11:36 AM  BP/Weight  Systolic BP 124 106 126  Diastolic BP 71 67 71  Wt. (Lbs) 212  218.26  BMI 36.39 kg/m2  38.66 kg/m2      Physical Exam Constitutional:      Appearance: She is well-developed.  ENT: Cerumen obscuring both ears Cardiovascular:     Rate and Rhythm: Normal rate.     Heart sounds: Normal heart sounds. No murmur heard. Pulmonary:     Effort: Pulmonary effort is normal.     Breath sounds: Normal breath sounds. No wheezing or rales.  Chest:     Chest wall: No tenderness.  Abdominal:     General: Bowel sounds are normal. There is no distension.     Palpations: Abdomen is soft. There is no mass.     Tenderness: There is no abdominal tenderness.  Musculoskeletal:        General: Normal range of motion.  No tenderness on palpation of lumbar spine.  Negative straight leg raise bilaterally    Right lower leg: No edema.     Left lower leg: No edema.  No tenderness on palpation of both feet.  Excoriation of skin in periphery of both feet Neurological:     Mental Status: She is alert and oriented to person, place, and time.  Psychiatric:        Mood and Affect: Mood normal.        Latest Ref Rng & Units 06/01/2021    9:53 AM 12/10/2020    8:37 AM 07/01/2020    8:43 AM  CMP  Glucose 70 - 99 mg/dL 88  81  90    BUN 8 - 27 mg/dL 14  20  22    Creatinine 0.57 - 1.00 mg/dL 08/31/2020   5.46   Sodium 134 - 144 mmol/L 144  144  138   Potassium 3.5 - 5.2 mmol/L 4.0  3.7  4.6   Chloride 96 - 106 mmol/L 107  102  106   CO2 20 -  29 mmol/L 23  27  23    Calcium 8.7 - 10.3 mg/dL 8.7  9.1  8.8   Total Protein 6.0 - 8.5 g/dL 7.4     Total Bilirubin 0.0 - 1.2 mg/dL 0.3     Alkaline Phos 44 - 121 IU/L 83     AST 0 - 40 IU/L 15     ALT 0 - 32 IU/L 10       Lipid Panel     Component Value Date/Time   CHOL 139 06/01/2021 0953   TRIG 58 06/01/2021 0953   HDL 58 06/01/2021 0953   CHOLHDL 4.7 (H) 08/08/2018 1145   LDLCALC 69 06/01/2021 0953    CBC    Component Value Date/Time   WBC 13.9 (H) 07/09/2020 0124   RBC 4.61 07/09/2020 0124   HGB 10.7 (L) 07/09/2020 0124   HGB 10.3 (L) 12/16/2019 1200   HCT 32.7 (L) 07/09/2020 0124   HCT 33.0 (L) 12/16/2019 1200   PLT 123 (L) 07/09/2020 0124   PLT 274 12/16/2019 1200   MCV 70.9 (L) 07/09/2020 0124   MCV 81 12/16/2019 1200   MCH 23.2 (L) 07/09/2020 0124   MCHC 32.7 07/09/2020 0124   RDW 19.0 (H) 07/09/2020 0124   RDW 17.9 (H) 12/16/2019 1200   LYMPHSABS 2,291 07/06/2020 0826   LYMPHSABS 2.1 12/16/2019 1200   MONOABS 0.3 03/01/2019 0742   EOSABS 58 07/06/2020 0826   EOSABS 0.1 12/16/2019 1200   BASOSABS 17 07/06/2020 0826   BASOSABS 0.0 12/16/2019 1200    Lab Results  Component Value Date   HGBA1C 5.6 09/22/2021    Assessment & Plan:  1. Type 2 diabetes mellitus with obesity ([place]) Controlled with A1c of 5.6 Continue diet control Counseled on Diabetic diet, my plate method, [id number] minutes of moderate intensity exercise/week Blood sugar logs with fasting goals of 80-120 mg/dl, random of less than 180 and in the event of sugars less than 60 mg/dl or greater than 400 mg/dl encouraged to notify the clinic. Advised on the need for annual eye exams, annual foot exams, Pneumonia vaccine. - POCT glucose (manual entry) - POCT glycosylated hemoglobin (Hb  A1C)  2. Vision abnormalities Counseled of available community resources for eye exams as unfortunately she will need to be self-pay due to lack of medical coverage  3. Other specified hearing loss of both ears Secondary to cerumen impaction  4. Diabetic polyneuropathy associated with type 2 diabetes mellitus (HCC) Uncontrolled Increase gabapentin dose - gabapentin (NEURONTIN) 100 MG capsule; Take 2 capsules (200 mg total) by mouth 3 (three) times daily.  Dispense: 180 capsule; Refill: 6  5. Tinea pedis of both feet Discussed foot care - clotrimazole (LOTRIMIN) 1 % cream; Apply 1 Application topically 2 (two) times daily. Apply to feet  Dispense: 60 g; Refill: 1  6. Chronic midline low back pain without sciatica Likely osteoarthritis Advised to apply heat or ice whichever is tolerated to painful areas. Counseled on evidence of improvement in pain control with regards to yoga, water aerobics, massage, home physical therapy, exercise as tolerated.  - diclofenac Sodium (VOLTAREN) 1 % GEL; Apply 4 g topically 4 (four) times daily. For pain  Dispense: 100 g; Refill: 1  7. Bilateral impacted cerumen Ear irrigation performed in the clinic    Meds ordered this encounter  Medications   gabapentin (NEURONTIN) 100 MG capsule    Sig: Take 2 capsules (200 mg total) by mouth 3 (three) times daily.    Dispense:  180 capsule  Refill:  6    Dose increase   diclofenac Sodium (VOLTAREN) 1 % GEL    Sig: Apply 4 g topically 4 (four) times daily. For pain    Dispense:  100 g    Refill:  1   clotrimazole (LOTRIMIN) 1 % cream    Sig: Apply 1 Application topically 2 (two) times daily. Apply to feet    Dispense:  60 g    Refill:  1     Follow-up: Return in about 6 months (around 03/25/2022) for Chronic medical conditions.       Hoy Register, MD, FAAFP. [facility] [place], [place] [phone]   09/22/2021, 5:02 PM

## 2021-10-06 NOTE — Congregational Nurse Program (Signed)
  Dept: [phone]   Congregational Nurse Program Note  Date of Encounter: 10/06/2021  Past Medical History: Past Medical History:  Diagnosis Date   Acute kidney injury ([facility]) 03/02/2019   Acute respiratory failure with hypoxia ([facility]CC) 02/22/2019   Atrial fibrillation with RVR ([facility]) 02/26/2019   COVID-19 02/22/2019   Dysrhythmia    para. atrial fibrillation   Flu-like symptoms 02/22/2019   Hypertension    Knee osteoarthritis 10/24/2017   Pneumonia     Encounter Details:  CNP Questionnaire - 10/06/21 1122       Questionnaire   Do you give verbal consent to treat you today? Yes    Location Patient Served  [facility]    Visit Setting Church or Organization    Patient Status Refugee    Insurance [facility]    Insurance Referral N/A    Medication N/A    Medical Provider Yes    Screening Referrals N/A    Medical Referral N/A    Medical Appointment Made N/A    Food N/A    Transportation N/A    Housing/Utilities N/A    Interpersonal Safety N/A    Intervention Advocate;Navigate Healthcare System;Case Management;Counsel;Educate;Support;Blood glucose    ED Visit Averted Yes    Life-Saving Intervention Made N/A           Patient is c/o increased lower extremities swelling bilaterally. She denied SOB, or CP. Swelling  is non pitting. Patient educated on low or no salt in diet and to keep legs elevated when resting. Advised on pressure stockings to help with edema. I will follow up with her next week and schedule appointment if problem persists.   Nicole Cella Evva Din RN BSN PCCN  [facility] [phone] [phone]-office

## 2021-10-13 ENCOUNTER — Other Ambulatory Visit: Payer: Self-pay

## 2021-11-15 NOTE — Congregational Nurse Program (Signed)
[name] [name] came in because she thought she had an appointment on 10/21 but her next appointment is in February. Provided written reminder of appointment with address [address]

## 2021-11-16 ENCOUNTER — Ambulatory Visit (HOSPITAL_COMMUNITY)
Admission: EM | Admit: 2021-11-16 | Discharge: 2021-11-16 | Disposition: A | Discharge status: Home / Self Care | Payer: Self-pay

## 2021-11-16 ENCOUNTER — Encounter (HOSPITAL_COMMUNITY): Payer: Self-pay | Admitting: Emergency Medicine

## 2021-11-16 ENCOUNTER — Ambulatory Visit: Payer: Self-pay | Admitting: *Deleted

## 2021-11-16 ENCOUNTER — Other Ambulatory Visit: Payer: Self-pay

## 2021-11-16 DIAGNOSIS — M79672 Pain in left foot: Secondary | ICD-10-CM

## 2021-11-16 NOTE — Congregational Nurse Program (Signed)
  Dept: [phone]   Congregational Nurse Program Note  Date of Encounter: 11/16/2021  Past Medical History: Past Medical History:  Diagnosis Date   Acute kidney injury ([place]) 03/02/2019   Acute respiratory failure with hypoxia ([place]) 02/22/2019   Atrial fibrillation with RVR ([place]) 02/26/2019   COVID-19 02/22/2019   Dysrhythmia    para. atrial fibrillation   Flu-like symptoms 02/22/2019   Hypertension    Knee osteoarthritis 10/24/2017   Pneumonia     Encounter Details:  Patient sent to urgent care. Transportation assistance provided.  Earlie Server Tenzin Pavon RN BSN [place] Nurse [phone]

## 2021-11-16 NOTE — Congregational Nurse Program (Signed)
  Dept: [phone]   Congregational Nurse Program Note  Date of Encounter: 11/16/2021  Past Medical History: Past Medical History:  Diagnosis Date   Acute kidney injury ([place]) 03/02/2019   Acute respiratory failure with hypoxia ([place]) 02/22/2019   Atrial fibrillation with RVR ([place]) 02/26/2019   COVID-19 02/22/2019   Dysrhythmia    para. atrial fibrillation   Flu-like symptoms 02/22/2019   Hypertension    Knee osteoarthritis 10/24/2017   Pneumonia     Encounter Details:  CNP Questionnaire - 11/16/21 1155       Questionnaire   Ask client: Do you give verbal consent for me to treat you today? Yes    Student Assistance [employer] Nurse    Location Patient Served  [facility]    Visit Setting with Client Church    Patient Status Refugee    Insurance [facility]    Insurance/Financial Assistance Referral N/A    Medication Have Medication Insecurities    Medical Provider Yes    Screening Referrals Made N/A    Medical Referrals Made [facility]    Medical Appointment Made [facility]    Recently w/o PCP, now 1st time PCP visit completed due to CNs referral or appointment made N/A    Food N/A    Transportation N/A    Housing/Utilities N/A    Interpersonal Safety N/A    Interventions Advocate/Support;Navigate Healthcare System;Case Management;Counsel;Educate    Abnormal to Normal Screening Since Last CN Visit N/A    Screenings CN Performed Blood Pressure    Sent Client to Lab for: N/A    Did client attend any of the following based off CNs referral or appointments made? N/A    ED Visit Averted Yes    Life-Saving Intervention Made N/A            Patient came in with complaint of left foot pain and swelling.  BP was checked 133/73.  Lower extremities examined with edema of LLE, pain radiating to lower leg, warm to touch.  [facility]ed [facility] to schedule an acute visit.  Waiting for nurse to call back with appointment time.  Advised patient to reduce weight bearing on left foot and keep  elevated. Advised patient that we are waiting on a call back from [facility] to get her seen by provider.   Earlie Server Mirna Sutcliffe RN BSN [place] Nurse [phone]

## 2021-11-16 NOTE — ED Provider Notes (Signed)
[facility]    CSN: [id number] Arrival date & time: 11/16/21  1327      History   Chief Complaint Chief Complaint  Patient presents with   Foot Pain    HPI [name] [name] is a 71 y.o. female.  Patient complaining of left foot pain and swelling x1 month.  Patient denies any trauma or fall upon onset of symptoms.  Patient states she was seen by another provider and given a medication that she cannot recall that did not help with her symptoms. patient denies any previous surgeries to left foot and left ankle.  Patient reports having replacement surgery on left ankle in 2022.  This Probation officer spoke with Skipper Cliche (caregiver), patient's caregiver stated that this issue has been ongoing and her LFT foot becomes swollen when she's not taking her medications.Per caregiver, this has been ongoing since LFT knee replacement in 2022. Patient's caregiver stated she is going to set up an appointment with the patient's orthopedist. The caregiver states her normal medication will arrive tomorrow.  Patient seems to be a poor historian and communication was difficult even with use of language interpreter service.    Foot Pain    Past Medical History:  Diagnosis Date   Acute kidney injury ([place]) 03/02/2019   Acute respiratory failure with hypoxia ([place]) 02/22/2019   Atrial fibrillation with RVR ([place]) 02/26/2019   COVID-19 02/22/2019   Dysrhythmia    para. 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   Conjunctival hemorrhage of right eye 03/25/2020   Chronic anticoagulation 03/25/2020   S/P total knee arthroplasty 12/04/2019   S/P knee surgery 12/03/2019   Type 2 diabetes mellitus with obesity ([place]) 07/22/2019   Chronic atrial fibrillation ([place]) 02/26/2019   Essential  hypertension, benign 05/03/2018   Knee osteoarthritis 10/24/2017    Past Surgical History:  Procedure Laterality Date   NO PAST SURGERIES     TOTAL KNEE ARTHROPLASTY Right 12/03/2019   Procedure: RIGHT TOTAL KNEE ARTHROPLASTY;  Surgeon: Meredith Pel, MD;  Location: [facility];  Service: Orthopedics;  Laterality: Right;   TOTAL KNEE ARTHROPLASTY Left 07/07/2020   Procedure: LEFT TOTAL KNEE ARTHROPLASTY-CEMENTED;  Surgeon: Meredith Pel, MD;  Location: [facility];  Service: Orthopedics;  Laterality: Left;    OB History     Gravida  5   Para      Term      Preterm      AB  1   Living         SAB      IAB      Ectopic      Multiple      Live Births  4            Home Medications    Prior to Admission medications   Medication Sig Start Date End Date Taking? Authorizing Provider  amLODipine (NORVASC) 5 MG tablet Take 1 tablet (5 mg total) by mouth daily. 06/01/21   Charlott Rakes, MD  apixaban (ELIQUIS) 5 MG TABS tablet Take 1 tablet (5 mg total) by mouth 2 (two) times daily. 11/30/20   Charlott Rakes, MD  clotrimazole (LOTRIMIN) 1 % cream Apply 1 Application topically 2 (two) times daily. Apply to feet 09/22/21   Newlin, Enobong,  MD  diclofenac Sodium (VOLTAREN) 1 % GEL Apply 4 g topically 4 (four) times daily. For pain 09/22/21   Charlott Rakes, MD  diltiazem (CARDIZEM CD) 120 MG 24 hr capsule TAKE 1 CAPSULE (120 MG TOTAL) BY MOUTH DAILY. 06/01/21 06/01/22  Charlott Rakes, MD  gabapentin (NEURONTIN) 100 MG capsule Take 2 capsules (200 mg total) by mouth 3 (three) times daily. 09/22/21 09/22/22  Charlott Rakes, MD  hydrochlorothiazide (HYDRODIURIL) 25 MG tablet Take 1 tablet (25 mg total) by mouth daily. 06/01/21   Charlott Rakes, MD  olopatadine (PATANOL) 0.1 % ophthalmic solution Place 1 drop into both eyes 2 (two) times daily. 06/01/21   Charlott Rakes, MD  rosuvastatin (CRESTOR) 10 MG tablet Take 1 tablet (10 mg total) by mouth daily. To lower cholesterol 08/30/21    Charlott Rakes, MD    Family History Family History  Family history unknown: Yes    Social History Social History   Tobacco Use   Smoking status: Never   Smokeless tobacco: Never  Vaping Use   Vaping Use: Never used  Substance Use Topics   Alcohol use: Never   Drug use: Never     Allergies   Patient has no known allergies.   Review of Systems Review of Systems  Musculoskeletal:        LFT foot pain and swelling.   Skin:  Negative for color change.     Physical Exam Triage Vital Signs ED Triage Vitals  Enc Vitals Group     BP 11/16/21 1459 (!) 157/77     Pulse Rate 11/16/21 1459 66     Resp 11/16/21 1459 18     Temp 11/16/21 1459 98 F (36.7 C)     Temp src --      SpO2 11/16/21 1459 100 %     Weight --      Height --      Head Circumference --      Peak Flow --      Pain Score 11/16/21 1454 10     Pain Loc --      Pain Edu? --      Excl. in [place]? --    No data found.  Updated Vital Signs BP (!) 157/77 (BP Location: Right Arm)   Pulse 66   Temp 98 F (36.7 C)   Resp 18   LMP  (LMP Unknown)   SpO2 100%      Physical Exam Vitals and nursing note reviewed.  Cardiovascular:     Pulses:          Posterior tibial pulses are 2+ on the left side.  Musculoskeletal:     Left lower leg: No swelling or tenderness. No edema.     Right foot: Normal.     Left foot: Normal range of motion and normal capillary refill. Swelling and tenderness present. No deformity or bony tenderness. Normal pulse.     Comments: Negative Homan's Sign, No tenderness upon palpation of calf or behind the knee.    Skin:    Capillary Refill: Capillary refill takes less than 2 seconds.     Comments: Skin around LFT foot and ankle was similar to RT foot.    Neurological:     Mental Status: She is alert.      UC Treatments / Results  Labs (all labs ordered are listed, but only abnormal results are displayed) Labs Reviewed - No data to display  EKG   Radiology No  results found.  Procedures Procedures (including critical care time)  Medications Ordered in UC Medications - No data to display  Initial Impression / Assessment and Plan / UC Course  I have reviewed the triage vital signs and the nursing notes.  Pertinent labs & imaging results that were available during my care of the patient were reviewed by me and considered in my medical decision making (see chart for details).   Patient was evaluated for left foot pain and swelling. Based on presentation and caregiver's information, this seems to be an issue related to medication management and a chronic issue that has been ongoing since patient had a past knee replacement in LFT extremity. Patient's caregiver was contacted to relay information and determine plan of care for patient.  Shared decision-making was used between caregiver and this Probation officer to determine next steps.  Patient had trauma to foot which is why imaging was not obtained.  Caregiver stated that patient is a refugee and has no insurance, since this issue appears to be intermittent due to patient not having her hydrochlorothiazide medication, it was decided to give naproxen for pain management.  Caregiver states she will pick up this medication over-the-counter at pharmacy.  Caregiver stated she will schedule an orthopedics appointment with the patient's orthopedist. Information was relayed to this patient before discharge and caregiver verbalized understanding of plan of care.  Final Clinical Impressions(s) / UC Diagnoses   Final diagnoses:  Left foot pain     Discharge Instructions      As discussed with caregiver, patient can take naproxen 500 mg every 12 hours as needed for pain and inflammation in foot.  I advise not taking this medication on empty stomach due to possible GI side effects.   Please go ahead and schedule an appointment with the orthopedist as we discussed.      ED Prescriptions   None    PDMP not reviewed  this encounter.   Flossie Dibble, NP 11/16/21 1622

## 2021-11-16 NOTE — ED Triage Notes (Addendum)
Pt reports left foot pain and swelling x 1 month. States she took a medication that was prescribed but it did not work , Reports pain got worse today.

## 2021-11-16 NOTE — Telephone Encounter (Signed)
Summary: Left foot pain and swelling   Dorothy a Nurse, children's Nurses office called in stating the patient has been experiencing left foot pain for some time now. The foot is swollen and a bit warm to the tough. Please assist patient further. Patient will be with Earlie Server for another hour and then you will need to contact her number and you will need a french interpreter when you call     Via [place] interpreter:  Chief Complaint: foot pain and warmth Symptoms: swollen Frequency: started this month Pertinent Negatives: Patient denies redness and fever Disposition: [] ED /[x] Urgent Care (no appt availability in office) / [] Appointment(In office/virtual)/ []  [place] [facility]/ [] Home Care/ [] Refused Recommended Disposition /[] [place] [facility]/ []  Follow-up with PCP Additional Notes: Pt states she can hardly put weight on foot to ambulate. UC recommended, Earlie Server, RN will arrange transportation.  Reason for Disposition  [1] SEVERE pain (e.g., excruciating, unable to do any normal activities) AND [2] not improved after 2 hours of pain medicine  Answer Assessment - Initial Assessment Questions 1. ONSET: "When did the pain start?"      Had surgery 2022, pain restarted this month, swollen 2. LOCATION: "Where is the pain located?"      Left foot 3. PAIN: "How bad is the pain?"    (Scale 1-10; or mild, moderate, severe)  - MILD (1-3): doesn't interfere with normal activities.   - MODERATE (4-7): interferes with normal activities (e.g., work or school) or awakens from sleep, limping.   - SEVERE (8-10): excruciating pain, unable to do any normal activities, unable to walk.      10 4. WORK OR EXERCISE: "Has there been any recent work or exercise that involved this part of the body?"      no 5. CAUSE: "What do you think is causing the foot pain?"     Swollen and warm, did not injure it 6. OTHER SYMPTOMS: "Do you have any other symptoms?" (e.g., leg pain, rash, fever, numbness)      Foot feels warm and is swollen 7. PREGNANCY: "Is there any chance you are pregnant?" "When was your last menstrual period?"     Na  Protocols used: Foot Pain-A-AH

## 2021-11-16 NOTE — Discharge Instructions (Signed)
As discussed with caregiver, patient can take naproxen 500 mg every 12 hours as needed for pain and inflammation in foot.  I advise not taking this medication on empty stomach due to possible GI side effects.   Please go ahead and schedule an appointment with the orthopedist as we discussed.

## 2021-11-17 ENCOUNTER — Ambulatory Visit: Payer: Self-pay

## 2021-11-17 DIAGNOSIS — Z23 Encounter for immunization: Secondary | ICD-10-CM

## 2021-11-18 ENCOUNTER — Other Ambulatory Visit: Payer: Self-pay | Admitting: Family Medicine

## 2021-11-18 DIAGNOSIS — Z1231 Encounter for screening mammogram for malignant neoplasm of breast: Secondary | ICD-10-CM

## 2021-11-20 ENCOUNTER — Inpatient Hospital Stay: Admission: RE | Admit: 2021-11-20 | Payer: Self-pay | Source: Ambulatory Visit

## 2021-11-24 ENCOUNTER — Telehealth: Payer: Self-pay

## 2021-11-24 ENCOUNTER — Other Ambulatory Visit: Payer: Self-pay

## 2021-11-24 NOTE — Telephone Encounter (Signed)
Called [facility] regarding medication refill. Gabapentin and Hydrochlorothiazide refilled on 10/17 and not due for refill now. Eliquis is being verified with outside source. Only Crestor can be refilled at this time and will be mailed to patients home.  Earlie Server Dessie Delcarlo RN BSN [place] Nurse [phone]

## 2021-11-24 NOTE — Congregational Nurse Program (Signed)
  Dept: [phone]   Congregational Nurse Program Note  Date of Encounter: 11/24/2021  Past Medical History: Past Medical History:  Diagnosis Date   Acute kidney injury ([place]) 03/02/2019   Acute respiratory failure with hypoxia ([place]) 02/22/2019   Atrial fibrillation with RVR ([place]) 02/26/2019   COVID-19 02/22/2019   Dysrhythmia    para. atrial fibrillation   Flu-like symptoms 02/22/2019   Hypertension    Knee osteoarthritis 10/24/2017   Pneumonia     Encounter Details:  CNP Questionnaire - 11/24/21 1148       Questionnaire   Ask client: Do you give verbal consent for me to treat you today? Yes    Student Assistance [employer] Nurse    Location Patient Served  [facility]    Visit Setting with Client [place]    Patient Status Refugee    Insurance Uninsured ([place] Card/Care Connects/Self-Pay/Medicaid Family Planning)    Insurance/Financial Assistance Referral N/A    Medication N/A    Medical Provider Yes    Screening Referrals Made N/A    Medical Referrals Made N/A    Medical Appointment Made N/A    Recently w/o PCP, now 1st time PCP visit completed due to CNs referral or appointment made N/A    Food N/A    Transportation N/A    Housing/Utilities N/A    Interpersonal Safety N/A    Interventions Advocate/Support;Navigate Healthcare System;Case Management;Counsel;Educate    Abnormal to Normal Screening Since Last CN Visit N/A    Screenings CN Performed Blood Pressure;Weight    Sent Client to Lab for: N/A    Did client attend any of the following based off CNs referral or appointments made? N/A    ED Visit Averted Yes    Life-Saving Intervention Made N/A             Patient came in for routine BP monitoring. Educated on diet and exercise. Educated on medication compliance. Stated that she is running low on medications. Will call pharmacy for refills.  Earlie Server Darran Gabay RN BSN [place] Nurse [phone]

## 2021-12-06 ENCOUNTER — Ambulatory Visit: Payer: Self-pay | Admitting: Family Medicine

## 2021-12-30 ENCOUNTER — Other Ambulatory Visit: Payer: Self-pay

## 2021-12-30 ENCOUNTER — Other Ambulatory Visit: Payer: Self-pay | Admitting: Family Medicine

## 2021-12-30 DIAGNOSIS — I10 Essential (primary) hypertension: Secondary | ICD-10-CM

## 2021-12-30 DIAGNOSIS — E7800 Pure hypercholesterolemia, unspecified: Secondary | ICD-10-CM

## 2021-12-30 NOTE — Telephone Encounter (Signed)
Interface surescripts requesting too soon , last dispensed doc. 11/25/21 #90 1 refill.  Requested Prescriptions  Refused Prescriptions Disp Refills   rosuvastatin (CRESTOR) 10 MG tablet 90 tablet 1    Sig: Take 1 tablet (10 mg total) by mouth daily. To lower cholesterol     Cardiovascular:  Antilipid - Statins 2 Failed - 12/30/2021  3:33 PM      Failed - Cr in normal range and within 360 days    Creatinine  Date Value Ref Range Status  03/13/2018 1.26 (H) 0.44 - 1.00 mg/dL Final   Creatinine, Ser  Date Value Ref Range Status  06/01/2021 1.15 (H) 0.57 - 1.00 mg/dL Final         Failed - Lipid Panel in normal range within the last 12 months    Cholesterol, Total  Date Value Ref Range Status  06/01/2021 139 100 - 199 mg/dL Final   LDL Chol Calc (NIH)  Date Value Ref Range Status  06/01/2021 69 0 - 99 mg/dL Final   HDL  Date Value Ref Range Status  06/01/2021 58 >39 mg/dL Final   Triglycerides  Date Value Ref Range Status  06/01/2021 58 0 - 149 mg/dL Final         Passed - Patient is not pregnant      Passed - Valid encounter within last 12 months    Recent Outpatient Visits           3 months ago Type 2 diabetes mellitus with obesity (HCC)   [facility] [place], [place], MD   7 months ago Type 2 diabetes mellitus with obesity (HCC)   [facility], Odette Horns, MD   1 year ago Type 2 diabetes mellitus with obesity (HCC)   [facility] Hoy Register, MD   1 year ago Essential hypertension   [facility] [place], FNP   1 year ago Subconjunctival hemorrhage of right eye   [facility] Hoy Register, MD       Future Appointments             In 2 months Hoy Register, MD [facility]

## 2022-01-10 ENCOUNTER — Other Ambulatory Visit: Payer: Self-pay | Admitting: Family Medicine

## 2022-01-10 ENCOUNTER — Other Ambulatory Visit: Payer: Self-pay

## 2022-01-10 DIAGNOSIS — I10 Essential (primary) hypertension: Secondary | ICD-10-CM

## 2022-01-11 ENCOUNTER — Other Ambulatory Visit: Payer: Self-pay

## 2022-01-11 MED ORDER — AMLODIPINE BESYLATE 5 MG PO TABS
5.0000 mg | ORAL_TABLET | Freq: Every day | ORAL | 2 refills | Status: DC
  Filled 2022-01-11: qty 30, 30d supply, fill #0
  Filled 2022-02-10: qty 30, 30d supply, fill #1

## 2022-01-17 ENCOUNTER — Other Ambulatory Visit: Payer: Self-pay

## 2022-01-17 ENCOUNTER — Ambulatory Visit: Payer: Self-pay | Attending: Family Medicine | Admitting: Family Medicine

## 2022-01-17 ENCOUNTER — Encounter: Payer: Self-pay | Admitting: Family Medicine

## 2022-01-17 ENCOUNTER — Ambulatory Visit: Payer: Self-pay

## 2022-01-17 VITALS — BP 119/73 | HR 79 | Ht 63.0 in | Wt 217.2 lb

## 2022-01-17 DIAGNOSIS — M25473 Effusion, unspecified ankle: Secondary | ICD-10-CM

## 2022-01-17 DIAGNOSIS — E1169 Type 2 diabetes mellitus with other specified complication: Secondary | ICD-10-CM

## 2022-01-17 DIAGNOSIS — E669 Obesity, unspecified: Secondary | ICD-10-CM

## 2022-01-17 DIAGNOSIS — M25572 Pain in left ankle and joints of left foot: Secondary | ICD-10-CM

## 2022-01-17 MED ORDER — PREDNISONE 20 MG PO TABS
20.0000 mg | ORAL_TABLET | Freq: Every day | ORAL | 0 refills | Status: DC
  Filled 2022-01-17: qty 5, 5d supply, fill #0

## 2022-01-17 NOTE — Telephone Encounter (Signed)
  Chief Complaint: Leg foot swelling. Symptoms: above Frequency: unsure Pertinent Negatives: Patient denies  Disposition: [] ED /[] Urgent Care (no appt availability in office) / [] Appointment(In office/virtual)/ []  [place] [facility]/ [] Home Care/ [] Refused Recommended Disposition /[] [facility]/ []  Follow-up with PCP Additional Notes: Call from friend / interpreter [name]. Early states that pt has swollen feet and ankles. Early was unable to get pt on the phone for triage.   Early would like to give her appt for this afternoon to [facility]. OK per FC.

## 2022-01-17 NOTE — Progress Notes (Signed)
Swelling in left foot.

## 2022-01-17 NOTE — Progress Notes (Signed)
Subjective:  Patient ID: [name] [name], female    DOB: January 10, 1951  Age: 71 y.o. MRN: [id number]  CC: Foot Swelling   HPI [name] [name] is a 71 y.o. year old female with a history of Type 2 DM (diet controlled with A1c of 5.5), Hypertension,  A.fib with RVR, s/p b/l TKA  Seen with the aid of an in person interpreter.  Interval History:  She Complains of pain and swelling from lower left  leg to left foot x 3 days to the point that she has been unable to bear weight on it.  Denies presence of calf pain, she has no shortness of breath or weight gain. She denies increased intake of sodium. She has not noticed any fever or redness of extremity.  Past Medical History:  Diagnosis Date   Acute kidney injury (HCC) 03/02/2019   Acute respiratory failure with hypoxia (HCC) 02/22/2019   Atrial fibrillation with RVR (HCC) 02/26/2019   COVID-19 02/22/2019   Dysrhythmia    para. atrial fibrillation   Flu-like symptoms 02/22/2019   Hypertension    Knee osteoarthritis 10/24/2017   Pneumonia     Past Surgical History:  Procedure Laterality Date   NO PAST SURGERIES     TOTAL KNEE ARTHROPLASTY Right 12/03/2019   Procedure: RIGHT TOTAL KNEE ARTHROPLASTY;  Surgeon: Cammy Copa, MD;  Location: [facility] OR;  Service: Orthopedics;  Laterality: Right;   TOTAL KNEE ARTHROPLASTY Left 07/07/2020   Procedure: LEFT TOTAL KNEE ARTHROPLASTY-CEMENTED;  Surgeon: Cammy Copa, MD;  Location: [facility] OR;  Service: Orthopedics;  Laterality: Left;    Family History  Family history unknown: Yes    Social History   Socioeconomic History   Marital status: Married    Spouse name: Not on file   Number of children: Not on file   Years of education: Not on file   Highest education level: Not on file  Occupational History   Not on file  Tobacco Use   Smoking status: Never   Smokeless tobacco: Never  Vaping Use   Vaping Use: Never used  Substance and Sexual Activity   Alcohol use: Never    Drug use: Never   Sexual activity: Not Currently    Birth control/protection: Post-menopausal  Other Topics Concern   Not on file  Social History Narrative   Not on file   Social Determinants of Health   Financial Resource Strain: Not on file  Food Insecurity: Not on file  Transportation Needs: No Transportation Needs (10/15/2019)   PRAPARE - Administrator, Civil Service (Medical): No    Lack of Transportation (Non-Medical): No  Physical Activity: Not on file  Stress: Not on file  Social Connections: Not on file    No Known Allergies  Outpatient Medications Prior to Visit  Medication Sig Dispense Refill   amLODipine (NORVASC) 5 MG tablet Take 1 tablet (5 mg total) by mouth daily. 30 tablet 2   apixaban (ELIQUIS) 5 MG TABS tablet Take 1 tablet (5 mg total) by mouth 2 (two) times daily. 180 tablet 1   clotrimazole (LOTRIMIN) 1 % cream Apply 1 Application topically 2 (two) times daily. Apply to feet 60 g 1   diclofenac Sodium (VOLTAREN) 1 % GEL Apply 4 g topically 4 (four) times daily. For pain 100 g 1   diltiazem (CARDIZEM CD) 120 MG 24 hr capsule TAKE 1 CAPSULE (120 MG TOTAL) BY MOUTH DAILY. 90 capsule 1   gabapentin (NEURONTIN) 100 MG capsule Take  2 capsules (200 mg total) by mouth 3 (three) times daily. 180 capsule 6   hydrochlorothiazide (HYDRODIURIL) 25 MG tablet Take 1 tablet (25 mg total) by mouth daily. 90 tablet 1   olopatadine (PATANOL) 0.1 % ophthalmic solution Place 1 drop into both eyes 2 (two) times daily. 5 mL 1   rosuvastatin (CRESTOR) 10 MG tablet Take 1 tablet (10 mg total) by mouth daily. To lower cholesterol 90 tablet 1   No facility-administered medications prior to visit.     ROS Review of Systems  Constitutional:  Negative for activity change and appetite change.  HENT:  Negative for sinus pressure and sore throat.   Respiratory:  Negative for chest tightness, shortness of breath and wheezing.   Cardiovascular:  Negative for chest pain  and palpitations.  Gastrointestinal:  Negative for abdominal distention, abdominal pain and constipation.  Genitourinary: Negative.   Musculoskeletal:        See HPI  Psychiatric/Behavioral:  Negative for behavioral problems and dysphoric mood.     Objective:  BP 119/73   Pulse 79   Ht 5\' 3"  (1.6 m)   Wt 217 lb 3.2 oz (98.5 kg)   LMP  (LMP Unknown)   SpO2 98%   BMI 38.48 kg/m      01/17/2022    3:09 PM 11/24/2021   11:47 AM 11/16/2021    2:59 PM  BP/Weight  Systolic BP 119 122 157  Diastolic BP 73 72 77  Wt. (Lbs) 217.2 219.36   BMI 38.48 kg/m2 37.65 kg/m2     Wt Readings from Last 3 Encounters:  01/17/22 217 lb 3.2 oz (98.5 kg)  11/24/21 219 lb 5.7 oz (99.5 kg)  09/22/21 212 lb (96.2 kg)     Physical Exam Constitutional:      Appearance: She is well-developed.  Cardiovascular:     Rate and Rhythm: Normal rate.     Heart sounds: Normal heart sounds. No murmur heard. Pulmonary:     Effort: Pulmonary effort is normal.     Breath sounds: Normal breath sounds. No wheezing or rales.  Chest:     Chest wall: No tenderness.  Abdominal:     General: Bowel sounds are normal. There is no distension.     Palpations: Abdomen is soft. There is no mass.     Tenderness: There is no abdominal tenderness.  Musculoskeletal:     Right lower leg: No edema.     Left lower leg: Edema (2+ non pitting L ankle edema) present.     Comments: Negative Homans' sign Tenderness on range of motion of left ankle Warmth of left ankle  Neurological:     Mental Status: She is alert and oriented to person, place, and time.  Psychiatric:        Mood and Affect: Mood normal.      Diabetic Foot Exam - Simple   Simple Foot Form Diabetic Foot exam was performed with the following findings: Yes 01/17/2022  3:49 PM  Visual Inspection See comments: Yes Sensation Testing Intact to touch and monofilament testing bilaterally: Yes Pulse Check Posterior Tibialis and Dorsalis pulse intact  bilaterally: Yes Comments Left foot and ankle edema, associated warmth.  Right foot is normal No callus, no ulceration        Latest Ref Rng & Units 06/01/2021    9:53 AM 12/10/2020    8:37 AM 07/01/2020    8:43 AM  CMP  Glucose 70 - 99 mg/dL 88  81  90  BUN 8 - 27 mg/dL 14  20  22    Creatinine 0.57 - 1.00 mg/dL  2.77  8.24   Sodium 134 - 144 mmol/L 144  144  138   Potassium 3.5 - 5.2 mmol/L 4.0  3.7  4.6   Chloride 96 - 106 mmol/L 107  102  106   CO2 20 - 29 mmol/L 23  27  23    Calcium 8.7 - 10.3 mg/dL 8.7  9.1  8.8   Total Protein 6.0 - 8.5 g/dL 7.4     Total Bilirubin 0.0 - 1.2 mg/dL 0.3     Alkaline Phos 44 - 121 IU/L 83     AST 0 - 40 IU/L 15     ALT 0 - 32 IU/L 10       Lipid Panel     Component Value Date/Time   CHOL 139 06/01/2021 0953   TRIG 58 06/01/2021 0953   HDL 58 06/01/2021 0953   CHOLHDL 4.7 (H) 08/08/2018 1145   LDLCALC 69 06/01/2021 0953    CBC    Component Value Date/Time   WBC 13.9 (H) 07/09/2020 0124   RBC 4.61 07/09/2020 0124   HGB 10.7 (L) 07/09/2020 0124   HGB 10.3 (L) 12/16/2019 1200   HCT 32.7 (L) 07/09/2020 0124   HCT 33.0 (L) 12/16/2019 1200   PLT 123 (L) 07/09/2020 0124   PLT 274 12/16/2019 1200   MCV 70.9 (L) 07/09/2020 0124   MCV 81 12/16/2019 1200   MCH 23.2 (L) 07/09/2020 0124   MCHC 32.7 07/09/2020 0124   RDW 19.0 (H) 07/09/2020 0124   RDW 17.9 (H) 12/16/2019 1200   LYMPHSABS 2,291 07/06/2020 0826   LYMPHSABS 2.1 12/16/2019 1200   MONOABS 0.3 03/01/2019 0742   EOSABS 58 07/06/2020 0826   EOSABS 0.1 12/16/2019 1200   BASOSABS 17 07/06/2020 0826   BASOSABS 0.0 12/16/2019 1200    Lab Results  Component Value Date   HGBA1C 5.6 09/22/2021    Assessment & Plan:  1. Acute left ankle pain Will need to exclude gout If uric acid is negative, consider placing on antibiotic for possible cellulitis - Uric Acid - predniSONE (DELTASONE) 20 MG tablet; Take 1 tablet (20 mg total) by mouth daily with breakfast.  Dispense: 5  tablet; Refill: 0 - CBC with Differential/Platelet  2. Type 2 diabetes mellitus with obesity (HCC) Diet controlled She is due for urine microalbumin Counseled on Diabetic diet, my plate method, 12/18/2019 minutes of moderate intensity exercise/week Blood sugar logs with fasting goals of 80-120 mg/dl, random of less than 09/24/2021 and in the event of sugars less than 60 mg/dl or greater than 361 mg/dl encouraged to notify the clinic. Advised on the need for annual eye exams, annual foot exams, Pneumonia vaccine. - Microalbumin/Creatinine Ratio, Urine  3. Ankle edema See #1 above Low suspicion for DVT - Brain natriuretic peptide    Meds ordered this encounter  Medications   predniSONE (DELTASONE) 20 MG tablet    Sig: Take 1 tablet (20 mg total) by mouth daily with breakfast.    Dispense:  5 tablet    Refill:  0    Follow-up: No follow-ups on file.       443, MD, FAAFP. [facility] [place], [facility] [place]   01/17/2022, 5:24 PM

## 2022-01-18 ENCOUNTER — Other Ambulatory Visit: Payer: Self-pay | Admitting: Family Medicine

## 2022-01-18 ENCOUNTER — Other Ambulatory Visit: Payer: Self-pay

## 2022-01-18 DIAGNOSIS — M10072 Idiopathic gout, left ankle and foot: Secondary | ICD-10-CM | POA: Insufficient documentation

## 2022-01-18 LAB — CBC WITH DIFFERENTIAL/PLATELET
Basophils Absolute: 0 10*3/uL (ref 0.0–0.2)
Basos: 0 %
EOS (ABSOLUTE): 0 10*3/uL (ref 0.0–0.4)
Eos: 0 %
Hematocrit: 38.7 % (ref 34.0–46.6)
Hemoglobin: 11.9 g/dL (ref 11.1–15.9)
Immature Grans (Abs): 0 10*3/uL (ref 0.0–0.1)
Immature Granulocytes: 0 %
Lymphocytes Absolute: 2.6 10*3/uL (ref 0.7–3.1)
Lymphs: 29 %
MCH: 23.9 pg — ABNORMAL LOW (ref 26.6–33.0)
MCHC: 30.7 g/dL — ABNORMAL LOW (ref 31.5–35.7)
MCV: 78 fL — ABNORMAL LOW (ref 79–97)
Monocytes Absolute: 1.4 10*3/uL — ABNORMAL HIGH (ref 0.1–0.9)
Monocytes: 16 %
Neutrophils Absolute: 4.8 10*3/uL (ref 1.4–7.0)
Neutrophils: 55 %
Platelets: 116 10*3/uL — ABNORMAL LOW (ref 150–450)
RBC: 4.98 x10E6/uL (ref 3.77–5.28)
RDW: 17.2 % — ABNORMAL HIGH (ref 11.7–15.4)
WBC: 8.9 10*3/uL (ref 3.4–10.8)

## 2022-01-18 LAB — BRAIN NATRIURETIC PEPTIDE: BNP: 22.3 pg/mL (ref 0.0–100.0)

## 2022-01-18 LAB — URIC ACID: Uric Acid: 9.2 mg/dL — ABNORMAL HIGH (ref 3.1–7.9)

## 2022-01-18 LAB — MICROALBUMIN / CREATININE URINE RATIO
Creatinine, Urine: 127 mg/dL
Microalb/Creat Ratio: 5 mg/g creat (ref 0–29)
Microalbumin, Urine: 6.1 ug/mL

## 2022-01-18 MED ORDER — COLCHICINE 0.6 MG PO TABS
ORAL_TABLET | ORAL | 1 refills | Status: DC
  Filled 2022-01-18: qty 30, 15d supply, fill #0
  Filled 2022-02-10: qty 30, 15d supply, fill #1

## 2022-01-21 ENCOUNTER — Other Ambulatory Visit: Payer: Self-pay

## 2022-01-21 ENCOUNTER — Ambulatory Visit: Payer: Self-pay | Admitting: *Deleted

## 2022-01-21 NOTE — Telephone Encounter (Addendum)
Summary: Medication advice   Pts spouse was contacted regarding Rx #: [id number] colchicine 0.6 MG tablet [[id number]] Pt was advised not to eat a certain food. Pt was advised not to eat a certain food. However she did not understand what food not to eat on the medication. Please advise         Per lab note: Please inform her that labs are in keeping with gout of her left foot.  I have sent medications for this to the pharmacy.  She needs to work on decreasing foods like beef and seafood intake as these can precipitate gout flares.   Patient's husband notified of foods to avoid. Patient Rx is not complete with daily dosing after onset gout dosing- patient's husband states his father took it daily with outbreak. After some research- advised that gout can be treated with"mega" dose or dosing over several days. Advised due to "mega" dose patient was prescribed- I would have patient wait until she hears back from provider. Patient asked if PCP can verify Rx instructions.  Advised would send message to provider for clarification  Reason for Disposition  [1] Caller has NON-URGENT medicine question about med that PCP prescribed AND [2] triager unable to answer question  Answer Assessment - Initial Assessment Questions 1. NAME of MEDICINE: "What medicine(s) are you calling about?"     How long is patient to take Colchicine- instructions not clear 2. QUESTION: "What is your question?" (e.g., double dose of medicine, side effect)     Clarify instructions on Rx for colchicine  3. PRESCRIBER: "Who prescribed the medicine?" Reason: if prescribed by specialist, call should be referred to that group.     PCP 4. SYMPTOMS: "Do you have any symptoms?" If Yes, ask: "What symptoms are you having?"  "How bad are the symptoms (e.g., mild, moderate, severe)     Pain in foot  Protocols used: Medication Question Call-A-AH

## 2022-01-25 NOTE — Telephone Encounter (Signed)
Pt has been left a VM with instruction on how to take the medication

## 2022-02-10 ENCOUNTER — Other Ambulatory Visit: Payer: Self-pay

## 2022-02-10 ENCOUNTER — Other Ambulatory Visit: Payer: Self-pay | Admitting: Family Medicine

## 2022-02-10 DIAGNOSIS — I10 Essential (primary) hypertension: Secondary | ICD-10-CM

## 2022-02-10 MED ORDER — HYDROCHLOROTHIAZIDE 25 MG PO TABS
25.0000 mg | ORAL_TABLET | Freq: Every day | ORAL | 1 refills | Status: DC
  Filled 2022-02-10: qty 30, 30d supply, fill #0

## 2022-02-11 ENCOUNTER — Other Ambulatory Visit: Payer: Self-pay

## 2022-02-15 NOTE — Congregational Nurse Program (Signed)
  Dept: [phone]   Congregational Nurse Program Note  Date of Encounter: 02/15/2022  Past Medical History: Past Medical History:  Diagnosis Date   Acute kidney injury ([place]) 03/02/2019   Acute respiratory failure with hypoxia ([place]) 02/22/2019   Atrial fibrillation with RVR ([place]) 02/26/2019   COVID-19 02/22/2019   Dysrhythmia    para. atrial fibrillation   Flu-like symptoms 02/22/2019   Hypertension    Knee osteoarthritis 10/24/2017   Pneumonia     Encounter Details:  CNP Questionnaire - 02/15/22 1226       Questionnaire   Ask client: Do you give verbal consent for me to treat you today? Yes    Student Assistance N/A   [facility] resident   Location Patient Served  [facility]    Visit Setting with Client Organization    Patient Status Refugee    Insurance Medicaid    Insurance/Financial Assistance Referral N/A    Medication N/A    Medical Provider No    Screening Referrals Made N/A    Medical Referrals Made N/A    Medical Appointment Made N/A    Recently w/o PCP, now 1st time PCP visit completed due to CNs referral or appointment made N/A    Food N/A    Transportation N/A    Housing/Utilities N/A    Interpersonal Safety N/A    Interventions Advocate/Support;Navigate Healthcare System;Educate;Counsel    Abnormal to Normal Screening Since Last CN Visit N/A    Screenings CN Performed Blood Pressure    Sent Client to Lab for: N/A    Did client attend any of the following based off CNs referral or appointments made? N/A    ED Visit Averted Yes    Life-Saving Intervention Made N/A             Patient presents to RN office with questions about her medications. She reports left ankle pain, was told she has an as needed medication but cannot recall the name. She believes she was told to take a pill first, wait an hour, and then take two more pills. Still having some pain, but better than before.   Chart review indicates PCP treating as though gout flare. Counseled  patient on avoidance of red meats (beef, pork) and alcohol.   Chart indicates her next PCP appointment is not for another month, on 2/15. Offered to call office and move sooner, but patient reports she is okay with waiting for the originally scheduled appointment. She expresses thanks, as she was unaware of this 2/15 appointment.   Earlie Server Perez Dirico RN BSN [place] Nurse [phone]

## 2022-02-23 NOTE — Congregational Nurse Program (Signed)
  Dept: [phone]   Congregational Nurse Program Note  Date of Encounter: 02/23/2022  Past Medical History: Past Medical History:  Diagnosis Date   Acute kidney injury ([place]) 03/02/2019   Acute respiratory failure with hypoxia ([place]) 02/22/2019   Atrial fibrillation with RVR ([place]) 02/26/2019   COVID-19 02/22/2019   Dysrhythmia    para. atrial fibrillation   Flu-like symptoms 02/22/2019   Hypertension    Knee osteoarthritis 10/24/2017   Pneumonia     Encounter Details:  CNP Questionnaire - 02/23/22 1222       Questionnaire   Ask client: Do you give verbal consent for me to treat you today? Yes    Student Assistance N/A   [facility] resident   Location Patient Served  [facility]    Visit Setting with Client Organization    Patient Status Refugee    Insurance Medicaid    Insurance/Financial Assistance Referral N/A    Medication N/A    Medical Provider No    Screening Referrals Made N/A    Medical Referrals Made N/A    Medical Appointment Made N/A    Recently w/o PCP, now 1st time PCP visit completed due to CNs referral or appointment made N/A    Food N/A    Transportation N/A    Housing/Utilities N/A    Interpersonal Safety N/A    Interventions Advocate/Support;Navigate Healthcare System;Educate;Counsel    Abnormal to Normal Screening Since Last CN Visit N/A    Screenings CN Performed N/A    Sent Client to Lab for: N/A    Did client attend any of the following based off CNs referral or appointments made? N/A    ED Visit Averted Yes    Life-Saving Intervention Made N/A            Patient brought in her medication for review. She was able to tell me what each medication indication and administration regimen. She is compliant. Reviewed dates of upcoming appointments.   Earlie Server Elvert Cumpton RN BSN [place] Nurse [phone]

## 2022-03-17 ENCOUNTER — Encounter: Payer: Self-pay | Admitting: Family Medicine

## 2022-03-17 ENCOUNTER — Ambulatory Visit: Payer: Self-pay | Attending: Family Medicine | Admitting: Family Medicine

## 2022-03-17 ENCOUNTER — Other Ambulatory Visit: Payer: Self-pay

## 2022-03-17 VITALS — BP 111/71 | HR 76 | Ht 63.0 in | Wt 220.6 lb

## 2022-03-17 DIAGNOSIS — M10072 Idiopathic gout, left ankle and foot: Secondary | ICD-10-CM

## 2022-03-17 DIAGNOSIS — E7800 Pure hypercholesterolemia, unspecified: Secondary | ICD-10-CM

## 2022-03-17 DIAGNOSIS — Z1211 Encounter for screening for malignant neoplasm of colon: Secondary | ICD-10-CM

## 2022-03-17 DIAGNOSIS — E1142 Type 2 diabetes mellitus with diabetic polyneuropathy: Secondary | ICD-10-CM

## 2022-03-17 DIAGNOSIS — I482 Chronic atrial fibrillation, unspecified: Secondary | ICD-10-CM

## 2022-03-17 DIAGNOSIS — I10 Essential (primary) hypertension: Secondary | ICD-10-CM

## 2022-03-17 DIAGNOSIS — E669 Obesity, unspecified: Secondary | ICD-10-CM

## 2022-03-17 DIAGNOSIS — E1169 Type 2 diabetes mellitus with other specified complication: Secondary | ICD-10-CM

## 2022-03-17 LAB — POCT GLYCOSYLATED HEMOGLOBIN (HGB A1C): HbA1c, POC (controlled diabetic range): 5.7 % (ref 0.0–7.0)

## 2022-03-17 MED ORDER — GABAPENTIN 100 MG PO CAPS
200.0000 mg | ORAL_CAPSULE | Freq: Three times a day (TID) | ORAL | 6 refills | Status: DC
  Filled 2022-03-17: qty 180, 30d supply, fill #0
  Filled 2022-04-20: qty 180, 30d supply, fill #1
  Filled 2022-05-30: qty 180, 30d supply, fill #2
  Filled 2022-07-05: qty 180, 30d supply, fill #3
  Filled 2022-08-10: qty 180, 30d supply, fill #4

## 2022-03-17 MED ORDER — AMLODIPINE BESYLATE 5 MG PO TABS
5.0000 mg | ORAL_TABLET | Freq: Every day | ORAL | 1 refills | Status: DC
  Filled 2022-03-17: qty 30, 30d supply, fill #0
  Filled 2022-04-20: qty 30, 30d supply, fill #1
  Filled 2022-05-30: qty 30, 30d supply, fill #2
  Filled 2022-07-05: qty 30, 30d supply, fill #3
  Filled 2022-08-10: qty 30, 30d supply, fill #4

## 2022-03-17 MED ORDER — ROSUVASTATIN CALCIUM 10 MG PO TABS
10.0000 mg | ORAL_TABLET | Freq: Every day | ORAL | 1 refills | Status: DC
  Filled 2022-03-17: qty 90, 90d supply, fill #0
  Filled 2022-07-05: qty 90, 90d supply, fill #1

## 2022-03-17 MED ORDER — DILTIAZEM HCL ER COATED BEADS 120 MG PO CP24
120.0000 mg | ORAL_CAPSULE | Freq: Every day | ORAL | 1 refills | Status: DC
  Filled 2022-03-17: qty 30, 30d supply, fill #0
  Filled 2022-04-20: qty 30, 30d supply, fill #1
  Filled 2022-05-30: qty 30, 30d supply, fill #2
  Filled 2022-07-05: qty 30, 30d supply, fill #3
  Filled 2022-08-10: qty 30, 30d supply, fill #4

## 2022-03-17 MED ORDER — APIXABAN 5 MG PO TABS
5.0000 mg | ORAL_TABLET | Freq: Two times a day (BID) | ORAL | 1 refills | Status: DC
  Filled 2022-03-17: qty 180, 90d supply, fill #0
  Filled 2022-09-08: qty 60, 30d supply, fill #0
  Filled 2022-11-07 – 2022-11-28 (×2): qty 60, 30d supply, fill #1

## 2022-03-17 MED ORDER — COLCHICINE 0.6 MG PO TABS
ORAL_TABLET | ORAL | 1 refills | Status: DC
  Filled 2022-03-17: qty 30, 15d supply, fill #0
  Filled 2022-04-20: qty 30, 15d supply, fill #1

## 2022-03-17 NOTE — Patient Instructions (Signed)
Goutte Gout  La goutte est une maladie qui provoque un gonflement douloureux des articulations. La goutte est un type d'inflammation des articulations (arthrite). Un excs d'acide urique dans l'organisme est  l'origine de cette affection. L'acide urique est un compos chimique qui se forme lorsque l'organisme fractionne des substances appeles purines. Les purines sont importantes pour la constitution des protines de l'organisme. Lorsque l'organisme contient trop d'acide urique, des cristaux Print production planner se former et Diplomatic Services operational officer. Cela provoque des douleurs et [employer]. Les crises de [employer] se produire rapidement et tre trs douloureuses (goutte aigu). Au fil du temps, les attaques peuvent affecter davantage d'articulations et devenir plus frquentes (goutte chronique). La goutte peut galement provoquer l'accumulation d'acide urique sous la peau et  Environmental education officer. Quelles sont les causes ? Un excs d'acide USAA sang est  l'origine de cette affection. Cela peut se produire si : Vos reins n'liminent pas suffisamment d'acide urique de votre sang. C'est la cause la plus frquente. Votre organisme produit trop d'acide urique. Cela peut arriver [place]ston Diones certains cancers et certains traitements contre le cancer. Cela peut galement se produire si votre organisme dtruit massivement les globules rouges (anmie hmolytique). Vous mangez trop d'aliments riches en purines. Parmi ces aliments, on trouve les abats et certains fruits de mer. L'alcool, en particulier la bire, est galement riche en purines. Une attaque de goutte peut tre dclenche par un traumatisme ou un stress. Quels sont les facteurs qui augmentent le risque ? Les facteurs suivants peuvent augmenter le risque de dvelopper cette affection : Avoir des antcdents familiaux de goutte. tre un homme d'ge mr. tre une femme mnopause. Prendre certains mdicaments,  notamment l'aspirine, la cyclosporine, les diurtiques, la lvodopa et la niacine. Avoir subi une transplantation d'organe. Souffrir de certaines affections, telles que : L'obsit. Un empoisonnement au plomb. Une maladie rnale. Une maladie de la peau appele  psoriasis . Il existe galement d'autres facteurs tels que : Une perte de poids trop rapide. Une dshydratation. Une consommation frquente [employer], en particulier de bire. Une consommation frquente de boissons contenant un type de sucre appel  fructose . Quels sont les signes ou symptmes ? Une attaque de goutte aigu se produit rapidement. Elle se produit habituellement dans une seule articulation. L'endroit le plus frquent est le gros orteil. Les attaques CIG[employer]. Les articulations des pieds, de la [place], du genou, des doigts, du poignet ou du [place] tre touches. Les symptmes de cette affection comprennent : De fortes douleurs. Une sensation de Librarian, academic. Un gonflement. Une raideur. Une sensibilit. L'articulation affecte peut tre trs douloureuse au toucher. Une peau brillante, rouge ou violette. Des frissons accompagns de fivre. La goutte chronique peut engendrer des symptmes plus frquemment. Davantage d'articulations peuvent tre impliques. Des nodules blancs ou jaunes (tophus) peuvent apparatre sur les mains ou les pieds ou dans d'autres endroits prs des articulations. Comment se fait le diagnostic ? Le diagnostic de cette affection repose sur les symptmes, les antcdents mdicaux et un examen physique. Vous devrez peut-tre Optometrist, tels que : Des tests sanguins permettant de Designer, television/film set taux d'acide urique. Un prlvement de fluide articulaire  l'aide d'une aiguille fine (aspiration) pour [employer] cristaux d'acide urique. Des radiographies pour dceler des lsions articulaires. Comment cette affection est-elle traite ? Le traitement de cette  affection comporte deux phases : le traitement d'une crise aigu et la prvention d'attaques futures. Le traitement de la goutte aigu peut inclure des [employer]  de rduire les douleurs et [employer]el, notamment : Des anti-inflammatoires non strodiens (AINS), comme l'ibuprofne. Des strodes. Ce sont des anti-inflammatoires puissants qui peuvent tre pris par la bouche (voie orale) ou injects Humana Inc articulation. De la colchicine. Ce mdicament soulage les douleurs et les gonflements lorsqu'il est pris peu de temps aprs une attaque. Il peut tre administr par Genworth Financial ou par [employer]. Le traitement prventif peut comprendre : L'utilisation quotidienne de doses plus faibles d'AINS ou de colchicine. La prise d'un mdicament permettant de rduire les taux d'acide urique Lennar Corporation sang, par exemple, l'allopurinol. Des modifications des [employer]. Vous devrez peut-tre consulter un ditticien pour savoir ce qu'il convient de manger et boire afin de prvenir les crises de goutte. Suivez les instructions suivantes  domicile : Sales promotion account executive crise de goutte  Si le prestataire de soins de sant vous le recommande, appliquez de la glace sur la zone [place]. Pour ce faire : Colman Cater de la glace dans un sac en plastique. Placez une serviette entre votre peau et le sac. Laissez la glace en place pendant 20 minutes, 2  3 fois par jour. Retirez la glace si votre peau devient rouge vif. Cela est trs important. Si vous ne ressentez Management consultant, la chaleur ou le froid, vous pourriez endommager votre peau plus facilement. Soulevez (levez) l'articulation touche au-dessus du niveau de votre cour aussi souvent que possible. Veillez  reposer l'articulation le plus possible. Si l'articulation touche se situe au niveau de la [place], il vous faudra peut-tre Fisher Scientific. Suivez les instructions de Landscape architect de soins de sant en ce qui concerne les  restrictions relatives  ce que vous pouvez manger ou boire. Pour viter les crises de goutte futures Suivez un rgime  Doctor, general practice en purine conformment Forensic psychologist de votre ditticien ou votre prestataire de soins de sant. vitez les aliments et les boissons riches en purines, y [employer] Data Systems, les reins, les anchois, les asperges, [employer], Safeway Inc, les [employer] and Annuity Association et la bire. Maintenez un poids sant ou perdez du poids si vous tes en surpoids. Si vous dsirez perdre du poids, parlez-en  votre prestataire de soins de sant. Severiano Gilbert  ne pas perdre de poids trop rapidement. Commencez ou Parker Hann[employer]in un programme d'exercice physique tel que recommand par votre prestataire de soins de sant. Alimentation et boissons vitez de consommer des boissons contenant du fructose. Buvez des quantits suffisantes de liquides pour garder votre urine jaune ple. Si vous consommez de l'alcool : Armed forces logistics/support/administrative officer consommation  : 1 verre par jour pour [employer] International femmes qui ne sont pas enceintes. 2 verres par Baxter International. Sachez quelle quantit d'alcool est contenue dans un verre. Aux .-U., un verre correspond  une bouteille de bire (355 ml [12 onces]),  un verre de vin (148 ml [5 onces]) ou  un verre  liqueur d'alcool fort (44 ml [1,5 once]). Instructions gnrales Prenez vos mdicaments en vente libre et sur ordonnance en suivant scrupuleusement les instructions de votre prestataire de soins de sant. Demandez  votre prestataire de soins de sant si vous pouvez conduire ou utiliser des machines lorsque vous prenez le mdicament qu'il vous a prescrit. Reprenez vos activits habituelles en veillant  suivre les recommandations de votre prestataire de soins de sant. Demandez  votre prestataire de soins de sant quelles activits sont sans danger pour vous. Rendez-vous  toutes les visites de suivi. C'est important. Pour plus d'informations [place] ([employer]  de la sant) :  [email or web address] Prenez Special educational needs teacher de soins de sant si vous avez : [place] Mungo crise de goutte. Des symptmes d'une crise de goutte qui persistent aprs 10 jours de Financial planner. Des effets secondaires lis  la prise de vos mdicaments. Des frissons ou de la fivre. Une sensation de brlure lorsque vous urinez. Une Goldman Sachs bas du dos ou  l'abdomen. Vous devez consulter immdiatement si vous : Avez de fortes ou incontrlables douleurs. N'arrivez pas  uriner. Rsum La goutte est un gonflement douloureux des articulations caus par un excs d'acide urique dans l'organisme. La crise de goutte se IT sales professional plus souvent au niveau du gros orteil, mais d'autres Brewing technologist tre touches. Les mdicaments et [employer] modification des [employer] aider  prvenir et  traiter les crises de goutte. Ces conseils et renseignements ne sauraient se substituer  l'avis mdical de votre prestataire de soins de sant. Par consquent, il est primordial de parler de toutes vos proccupations avec votre prestataire de soins de sant. Document Revised: 11/06/2020 Document Reviewed: 11/06/2020 Elsevier Patient Education  [place].

## 2022-03-17 NOTE — Progress Notes (Signed)
Subjective:  Patient ID: [name] [name], female    DOB: 02/14/1950  Age: 72 y.o. MRN: [id number]  CC: Diabetes   HPI Shanaya Danijah Ave is a 72 y.o. year old female with a history of Type 2 DM (diet controlled with A1c of 5.7), Hypertension,  A.fib with RVR, s/p b/l TKA, gout. Seen with the aid of an in person interpreter who happens to be a friend of hers  Interval History: Endorses adherence with her statin and denies adverse effects with it.  Diabetes is diet controlled.  She is yet to undergo annual eye exams but we had discussed resources for eye exams given lack of medical coverage to refer her to ophthalmology.  After her last visit when she had presented with left foot pain she was diagnosed with gout and placed on colchicine and prednisone. She never took the colchicine due to poor comprehension of administration instructions. She has pain in her right ankle and foot but this is not as intense as when she presented to the clinic. Pain is not present all the time.  From a cardiac standpoint she is doing fine with no dyspnea, chest pains, palpitations or lightheadedness. She has no additional concerns today. Past Medical History:  Diagnosis Date   Acute kidney injury ([place]) 03/02/2019   Acute respiratory failure with hypoxia ([place]) 02/22/2019   Atrial fibrillation with RVR ([place]) 02/26/2019   COVID-19 02/22/2019   Dysrhythmia    para. atrial fibrillation   Flu-like symptoms 02/22/2019   Hypertension    Knee osteoarthritis 10/24/2017   Pneumonia     Past Surgical History:  Procedure Laterality Date   NO PAST SURGERIES     TOTAL KNEE ARTHROPLASTY Right 12/03/2019   Procedure: RIGHT TOTAL KNEE ARTHROPLASTY;  Surgeon: Meredith Pel, MD;  Location: [facility];  Service: Orthopedics;  Laterality: Right;   TOTAL KNEE ARTHROPLASTY Left 07/07/2020   Procedure: LEFT TOTAL KNEE ARTHROPLASTY-CEMENTED;  Surgeon: Meredith Pel, MD;  Location: [facility];  Service: Orthopedics;   Laterality: Left;    Family History  Family history unknown: Yes    Social History   Socioeconomic History   Marital status: Married    Spouse name: Not on file   Number of children: Not on file   Years of education: Not on file   Highest education level: Not on file  Occupational History   Not on file  Tobacco Use   Smoking status: Never   Smokeless tobacco: Never  Vaping Use   Vaping Use: Never used  Substance and Sexual Activity   Alcohol use: Never   Drug use: Never   Sexual activity: Not Currently    Birth control/protection: Post-menopausal  Other Topics Concern   Not on file  Social History Narrative   Not on file   Social Determinants of Health   Financial Resource Strain: Not on file  Food Insecurity: Not on file  Transportation Needs: No Transportation Needs (10/15/2019)   PRAPARE - Hydrologist (Medical): No    Lack of Transportation (Non-Medical): No  Physical Activity: Not on file  Stress: Not on file  Social Connections: Not on file    No Known Allergies  Outpatient Medications Prior to Visit  Medication Sig Dispense Refill   clotrimazole (LOTRIMIN) 1 % cream Apply 1 Application topically 2 (two) times daily. Apply to feet 60 g 1   diclofenac Sodium (VOLTAREN) 1 % GEL Apply 4 g topically 4 (four) times daily. For pain  100 g 1   olopatadine (PATANOL) 0.1 % ophthalmic solution Place 1 drop into both eyes 2 (two) times daily. 5 mL 1   amLODipine (NORVASC) 5 MG tablet Take 1 tablet (5 mg total) by mouth daily. 30 tablet 2   apixaban (ELIQUIS) 5 MG TABS tablet Take 1 tablet (5 mg total) by mouth 2 (two) times daily. 180 tablet 1   colchicine 0.6 MG tablet Take 2 tablets by mouth at the onset of a gout flare, repeat 1 tablet in 1 hour if flare continues. 30 tablet 1   diltiazem (CARDIZEM CD) 120 MG 24 hr capsule TAKE 1 CAPSULE (120 MG TOTAL) BY MOUTH DAILY. 90 capsule 1   gabapentin (NEURONTIN) 100 MG capsule Take 2 capsules  (200 mg total) by mouth 3 (three) times daily. 180 capsule 6   hydrochlorothiazide (HYDRODIURIL) 25 MG tablet Take 1 tablet (25 mg total) by mouth daily. 90 tablet 1   rosuvastatin (CRESTOR) 10 MG tablet Take 1 tablet (10 mg total) by mouth daily. To lower cholesterol 90 tablet 1   predniSONE (DELTASONE) 20 MG tablet Take 1 tablet (20 mg total) by mouth daily with breakfast. (Patient not taking: Reported on 03/17/2022) 5 tablet 0   No facility-administered medications prior to visit.     ROS Review of Systems  Constitutional:  Negative for activity change and appetite change.  HENT:  Negative for sinus pressure and sore throat.   Respiratory:  Negative for chest tightness, shortness of breath and wheezing.   Cardiovascular:  Negative for chest pain and palpitations.  Gastrointestinal:  Negative for abdominal distention, abdominal pain and constipation.  Genitourinary: Negative.   Musculoskeletal: Negative.   Psychiatric/Behavioral:  Negative for behavioral problems and dysphoric mood.     Objective:  BP 111/71   Pulse 76   Ht 5' 3"$  (1.6 m)   Wt 220 lb 9.6 oz (100.1 kg)   LMP  (LMP Unknown)   SpO2 99%   BMI 39.08 kg/m      03/17/2022    3:38 PM 02/15/2022   12:24 PM 01/17/2022    3:09 PM  BP/Weight  Systolic BP [id number]  Diastolic BP 71 77 73  Wt. (Lbs) 220.6  217.2  BMI 39.08 kg/m2  38.48 kg/m2      Physical Exam Constitutional:      Appearance: She is well-developed.  Cardiovascular:     Rate and Rhythm: Normal rate.     Heart sounds: Normal heart sounds. No murmur heard. Pulmonary:     Effort: Pulmonary effort is normal.     Breath sounds: Normal breath sounds. No wheezing or rales.  Chest:     Chest wall: No tenderness.  Abdominal:     General: Bowel sounds are normal. There is no distension.     Palpations: Abdomen is soft. There is no mass.     Tenderness: There is no abdominal tenderness.  Musculoskeletal:        General: Normal range of motion.      Right lower leg: Edema (1+) present.     Left lower leg: Edema (1+) present.  Neurological:     Mental Status: She is alert and oriented to person, place, and time.  Psychiatric:        Mood and Affect: Mood normal.        Latest Ref Rng & Units 06/01/2021    9:53 AM 12/10/2020    8:37 AM 07/01/2020    8:43 AM  CMP  Glucose  70 - 99 mg/dL 88  81  90   BUN 8 - 27 mg/dL 14  20  22   $ Creatinine 0.57 - 1.00 mg/dL 1.15  1.19  1.19   Sodium 134 - 144 mmol/L 144  144  138   Potassium 3.5 - 5.2 mmol/L 4.0  3.7  4.6   Chloride 96 - 106 mmol/L 107  102  106   CO2 20 - 29 mmol/L 23  27  23   $ Calcium 8.7 - 10.3 mg/dL 8.7  9.1  8.8   Total Protein 6.0 - 8.5 g/dL 7.4     Total Bilirubin 0.0 - 1.2 mg/dL 0.3     Alkaline Phos 44 - 121 IU/L 83     AST 0 - 40 IU/L 15     ALT 0 - 32 IU/L 10       Lipid Panel     Component Value Date/Time   CHOL 139 06/01/2021 0953   TRIG 58 06/01/2021 0953   HDL 58 06/01/2021 0953   CHOLHDL 4.7 (H) 08/08/2018 1145   LDLCALC 69 06/01/2021 0953    CBC    Component Value Date/Time   WBC 8.9 01/17/2022 1555   WBC 13.9 (H) 07/09/2020 0124   RBC 4.98 01/17/2022 1555   RBC 4.61 07/09/2020 0124   HGB 11.9 01/17/2022 1555   HCT 38.7 01/17/2022 1555   PLT 116 (L) 01/17/2022 1555   MCV 78 (L) 01/17/2022 1555   MCH 23.9 (L) 01/17/2022 1555   MCH 23.2 (L) 07/09/2020 0124   MCHC 30.7 (L) 01/17/2022 1555   MCHC 32.7 07/09/2020 0124   RDW 17.2 (H) 01/17/2022 1555   LYMPHSABS 2.6 01/17/2022 1555   MONOABS 0.3 03/01/2019 0742   EOSABS 0.0 01/17/2022 1555   BASOSABS 0.0 01/17/2022 1555    Lab Results  Component Value Date   HGBA1C 5.7 03/17/2022    Assessment & Plan:  1. Type 2 diabetes mellitus with obesity (HCC) Diet controlled with A1c of 5.7 Counseled on Diabetic diet, my plate method, [id number] minutes of moderate intensity exercise/week Blood sugar logs with fasting goals of 80-120 mg/dl, random of less than 180 and in the event of sugars less than 60  mg/dl or greater than 400 mg/dl encouraged to notify the clinic. Advised on the need for annual eye exams, annual foot exams, Pneumonia vaccine. - POCT glycosylated hemoglobin (Hb A1C) - CMP14+EGFR  2. Screening for colon cancer - Fecal occult blood, imunochemical([facility])  3. Essential hypertension Controlled Discontinued HCTZ to prevent gout flareups If blood pressure is above goal at next visit consider increasing amlodipine dose Counseled that she might experience some pedal edema given discontinuation of diuretic Counseled on blood pressure goal of less than 130/80, low-sodium, DASH diet, medication compliance, 150 minutes of moderate intensity exercise per week. Discussed medication compliance, adverse effects. - amLODipine (NORVASC) 5 MG tablet; Take 1 tablet (5 mg total) by mouth daily.  Dispense: 90 tablet; Refill: 1 - diltiazem (CARDIZEM CD) 120 MG 24 hr capsule; Take 1 capsule (120 mg total) by mouth daily.  Dispense: 90 capsule; Refill: 1 - rosuvastatin (CRESTOR) 10 MG tablet; Take 1 tablet (10 mg total) by mouth daily. To lower cholesterol  Dispense: 90 tablet; Refill: 1  4. Chronic atrial fibrillation (HCC) Currently in sinus rhythm Continue anticoagulation with Eliquis and rate control with Cardizem - apixaban (ELIQUIS) 5 MG TABS tablet; Take 1 tablet (5 mg total) by mouth 2 (two) times daily.  Dispense: 180 tablet; Refill: 1 -  diltiazem (CARDIZEM CD) 120 MG 24 hr capsule; Take 1 capsule (120 mg total) by mouth daily.  Dispense: 90 capsule; Refill: 1  5. Diabetic polyneuropathy associated with type 2 diabetes mellitus (HCC) Stable - gabapentin (NEURONTIN) 100 MG capsule; Take 2 capsules (200 mg total) by mouth 3 (three) times daily.  Dispense: 180 capsule; Refill: 6  6. Hypercholesterolemia Controlled Low-cholesterol diet - rosuvastatin (CRESTOR) 10 MG tablet; Take 1 tablet (10 mg total) by mouth daily. To lower cholesterol  Dispense: 90 tablet; Refill: 1  7.  Acute idiopathic gout of left foot Acute flare has resolved Counseled on proper administration of colchicine Based on the fact that she has not had recurrent flares we do not need to initiate prophylactic medication - colchicine 0.6 MG tablet; Take 2 tablets by mouth at the onset of a gout flare, repeat 1 tablet in 1 hour if flare continues.  Dispense: 30 tablet; Refill: 1   Health Care Maintenance: Her friend mentioned that she did have an abnormal Pap smear in the past which she needed to follow-up on but never did.  Review of her chart indicate positive findings for LSIL in 2021.  I am unsure why she had a Pap smear at the age of 78 but given that finding she will need to follow-up and I have scheduled this for her.  Meds ordered this encounter  Medications   amLODipine (NORVASC) 5 MG tablet    Sig: Take 1 tablet (5 mg total) by mouth daily.    Dispense:  90 tablet    Refill:  1   apixaban (ELIQUIS) 5 MG TABS tablet    Sig: Take 1 tablet (5 mg total) by mouth 2 (two) times daily.    Dispense:  180 tablet    Refill:  1   colchicine 0.6 MG tablet    Sig: Take 2 tablets by mouth at the onset of a gout flare, repeat 1 tablet in 1 hour if flare continues.    Dispense:  30 tablet    Refill:  1   diltiazem (CARDIZEM CD) 120 MG 24 hr capsule    Sig: Take 1 capsule (120 mg total) by mouth daily.    Dispense:  90 capsule    Refill:  1   gabapentin (NEURONTIN) 100 MG capsule    Sig: Take 2 capsules (200 mg total) by mouth 3 (three) times daily.    Dispense:  180 capsule    Refill:  6   rosuvastatin (CRESTOR) 10 MG tablet    Sig: Take 1 tablet (10 mg total) by mouth daily. To lower cholesterol    Dispense:  90 tablet    Refill:  1    Follow-up: Return in about 1 month (around 04/15/2022) for Chronic medical conditions, Pap smear.       Charlott Rakes, MD, FAAFP. [facility] [place], [place]   03/17/2022, 4:14 PM

## 2022-03-18 ENCOUNTER — Other Ambulatory Visit: Payer: Self-pay | Admitting: Family Medicine

## 2022-03-18 ENCOUNTER — Other Ambulatory Visit: Payer: Self-pay

## 2022-03-18 LAB — CMP14+EGFR
ALT: 17 IU/L (ref 0–32)
AST: 20 IU/L (ref 0–40)
Albumin/Globulin Ratio: 1.3 (ref 1.2–2.2)
Albumin: 4.3 g/dL (ref 3.8–4.8)
Alkaline Phosphatase: 70 IU/L (ref 44–121)
BUN/Creatinine Ratio: 7 — ABNORMAL LOW (ref 12–28)
BUN: 7 mg/dL — ABNORMAL LOW (ref 8–27)
Bilirubin Total: 0.3 mg/dL (ref 0.0–1.2)
CO2: 26 mmol/L (ref 20–29)
Calcium: 9 mg/dL (ref 8.7–10.3)
Chloride: 100 mmol/L (ref 96–106)
Creatinine, Ser: 1.06 mg/dL — ABNORMAL HIGH (ref 0.57–1.00)
Globulin, Total: 3.2 g/dL (ref 1.5–4.5)
Glucose: 75 mg/dL (ref 70–99)
Potassium: 3.2 mmol/L — ABNORMAL LOW (ref 3.5–5.2)
Sodium: 143 mmol/L (ref 134–144)
Total Protein: 7.5 g/dL (ref 6.0–8.5)
eGFR: 55 mL/min/{1.73_m2} — ABNORMAL LOW (ref >59)

## 2022-03-18 MED ORDER — POTASSIUM CHLORIDE ER 10 MEQ PO TBCR
10.0000 meq | EXTENDED_RELEASE_TABLET | Freq: Every day | ORAL | 1 refills | Status: DC
  Filled 2022-03-18: qty 90, 90d supply, fill #0
  Filled 2022-07-05: qty 90, 90d supply, fill #1

## 2022-03-24 ENCOUNTER — Telehealth: Payer: Self-pay

## 2022-03-24 NOTE — Telephone Encounter (Signed)
Pt's husband, Kevan Ny given lab results per notes of Dr. Margarita Rana from 03/17/22 on 03/24/22. Jared verbalized understanding, asked about upcoming appt on 04/20/22 and if needing to fast, advised him that pt can fast that day but if not capable that is okay too. No further assistance noted.

## 2022-03-26 LAB — FECAL OCCULT BLOOD, IMMUNOCHEMICAL: Fecal Occult Bld: NEGATIVE

## 2022-03-28 ENCOUNTER — Ambulatory Visit: Payer: Self-pay | Admitting: Family Medicine

## 2022-03-30 ENCOUNTER — Other Ambulatory Visit: Payer: Self-pay

## 2022-03-30 DIAGNOSIS — Z1231 Encounter for screening mammogram for malignant neoplasm of breast: Secondary | ICD-10-CM

## 2022-04-11 ENCOUNTER — Ambulatory Visit
Admission: RE | Admit: 2022-04-11 | Discharge: 2022-04-11 | Disposition: A | Discharge status: Home / Self Care | Payer: No Typology Code available for payment source | Source: Ambulatory Visit | Attending: Family Medicine | Admitting: Family Medicine

## 2022-04-11 DIAGNOSIS — Z1231 Encounter for screening mammogram for malignant neoplasm of breast: Secondary | ICD-10-CM

## 2022-04-20 ENCOUNTER — Encounter: Payer: Self-pay | Admitting: Family Medicine

## 2022-04-20 ENCOUNTER — Ambulatory Visit: Payer: Self-pay | Attending: Family Medicine | Admitting: Family Medicine

## 2022-04-20 ENCOUNTER — Other Ambulatory Visit: Payer: Self-pay

## 2022-04-20 ENCOUNTER — Other Ambulatory Visit (HOSPITAL_COMMUNITY)
Admission: RE | Admit: 2022-04-20 | Discharge: 2022-04-20 | Disposition: A | Discharge status: Home / Self Care | Payer: Self-pay | Source: Ambulatory Visit | Attending: Family Medicine | Admitting: Family Medicine

## 2022-04-20 VITALS — BP 143/79 | HR 64 | Temp 98.1°F | Ht 63.0 in | Wt 220.6 lb

## 2022-04-20 DIAGNOSIS — Z124 Encounter for screening for malignant neoplasm of cervix: Secondary | ICD-10-CM

## 2022-04-20 DIAGNOSIS — Z0000 Encounter for general adult medical examination without abnormal findings: Secondary | ICD-10-CM

## 2022-04-20 NOTE — Progress Notes (Signed)
Subjective:  Patient ID: [name] [name], female    DOB: 07-06-1950  Age: 72 y.o. MRN: [id number]  CC: Gynecologic Exam   HPI [name] [name] is a 72 y.o. year old female with a history of Type 2 DM (diet controlled with A1c of 5.7), Hypertension,  A.fib with RVR, s/p b/l TKA, gout. Seen with the aid of an in person interpreter who happens to be a friend of hers.  Interval History: She presents today for a gynecological exam.  Per her friend she had an abnormal Pap smear in the past which was never followed up on. Review of her chart indicate Pap smear from 2021 revealed LSIL, HPV negative.  Discussed with her friend that ideally she is outside the window for Pap smears per guidelines which indicate that they are not indicated after the age of 72. She is up to date on her mammogram from 04/2022 which was negative for malignancy. Annual colorectal cancer screen from 03/2023 was negative. Past Medical History:  Diagnosis Date   Acute kidney injury ([place]) 03/02/2019   Acute respiratory failure with hypoxia ([place]) 02/22/2019   Atrial fibrillation with RVR ([place]) 02/26/2019   COVID-19 02/22/2019   Dysrhythmia    para. atrial fibrillation   Flu-like symptoms 02/22/2019   Hypertension    Knee osteoarthritis 10/24/2017   Pneumonia     Past Surgical History:  Procedure Laterality Date   NO PAST SURGERIES     TOTAL KNEE ARTHROPLASTY Right 12/03/2019   Procedure: RIGHT TOTAL KNEE ARTHROPLASTY;  Surgeon: Meredith Pel, MD;  Location: [facility];  Service: Orthopedics;  Laterality: Right;   TOTAL KNEE ARTHROPLASTY Left 07/07/2020   Procedure: LEFT TOTAL KNEE ARTHROPLASTY-CEMENTED;  Surgeon: Meredith Pel, MD;  Location: [facility];  Service: Orthopedics;  Laterality: Left;    Family History  Problem Relation Age of Onset   Breast cancer Neg Hx     Social History   Socioeconomic History   Marital status: Married    Spouse name: Not on file   Number of children: Not on file    Years of education: Not on file   Highest education level: Not on file  Occupational History   Not on file  Tobacco Use   Smoking status: Never   Smokeless tobacco: Never  Vaping Use   Vaping Use: Never used  Substance and Sexual Activity   Alcohol use: Never   Drug use: Never   Sexual activity: Not Currently    Birth control/protection: Post-menopausal  Other Topics Concern   Not on file  Social History Narrative   Not on file   Social Determinants of Health   Financial Resource Strain: Not on file  Food Insecurity: Not on file  Transportation Needs: No Transportation Needs (10/15/2019)   PRAPARE - Hydrologist (Medical): No    Lack of Transportation (Non-Medical): No  Physical Activity: Not on file  Stress: Not on file  Social Connections: Not on file    No Known Allergies  Outpatient Medications Prior to Visit  Medication Sig Dispense Refill   amLODipine (NORVASC) 5 MG tablet Take 1 tablet (5 mg total) by mouth daily. 90 tablet 1   apixaban (ELIQUIS) 5 MG TABS tablet Take 1 tablet (5 mg total) by mouth 2 (two) times daily. 180 tablet 1   clotrimazole (LOTRIMIN) 1 % cream Apply 1 Application topically 2 (two) times daily. Apply to feet 60 g 1   colchicine 0.6 MG tablet Take  2 tablets by mouth at the onset of a gout flare, repeat 1 tablet in 1 hour if flare continues. 30 tablet 1   diclofenac Sodium (VOLTAREN) 1 % GEL Apply 4 g topically 4 (four) times daily. For pain 100 g 1   diltiazem (CARDIZEM CD) 120 MG 24 hr capsule Take 1 capsule (120 mg total) by mouth daily. 90 capsule 1   gabapentin (NEURONTIN) 100 MG capsule Take 2 capsules (200 mg total) by mouth 3 (three) times daily. 180 capsule 6   olopatadine (PATANOL) 0.1 % ophthalmic solution Place 1 drop into both eyes 2 (two) times daily. 5 mL 1   potassium chloride (KLOR-CON 10) 10 MEQ tablet Take 1 tablet (10 mEq total) by mouth daily. 90 tablet 1   rosuvastatin (CRESTOR) 10 MG tablet Take  1 tablet (10 mg total) by mouth daily. To lower cholesterol 90 tablet 1   predniSONE (DELTASONE) 20 MG tablet Take 1 tablet (20 mg total) by mouth daily with breakfast. (Patient not taking: Reported on 04/20/2022) 5 tablet 0   No facility-administered medications prior to visit.     ROS Review of Systems  Constitutional:  Negative for activity change and appetite change.  HENT:  Negative for sinus pressure and sore throat.   Respiratory:  Negative for chest tightness, shortness of breath and wheezing.   Cardiovascular:  Negative for chest pain and palpitations.  Gastrointestinal:  Negative for abdominal distention, abdominal pain and constipation.  Genitourinary: Negative.   Musculoskeletal: Negative.   Psychiatric/Behavioral:  Negative for behavioral problems and dysphoric mood.     Objective:  BP (!) 143/79   Pulse 64   Temp 98.1 F (36.7 C) (Oral)   Ht 5\' 3"  (1.6 m)   Wt 220 lb 9.6 oz (100.1 kg)   LMP  (LMP Unknown)   SpO2 97%   BMI 39.08 kg/m      04/20/2022    9:30 AM 03/17/2022    3:38 PM 02/15/2022   12:24 PM  BP/Weight  Systolic BP [id number]  Diastolic BP 79 71 77  Wt. (Lbs) 220.6 220.6   BMI 39.08 kg/m2 39.08 kg/m2       Physical Exam Exam conducted with a chaperone present.  Constitutional:      General: She is not in acute distress.    Appearance: She is well-developed. She is not diaphoretic.  HENT:     Head: Normocephalic.     Right Ear: External ear normal.     Left Ear: External ear normal.     Nose: Nose normal.  Eyes:     Conjunctiva/sclera: Conjunctivae normal.     Pupils: Pupils are equal, round, and reactive to light.  Neck:     Vascular: No JVD.  Cardiovascular:     Rate and Rhythm: Normal rate and regular rhythm.     Heart sounds: Normal heart sounds. No murmur heard.    No gallop.  Pulmonary:     Effort: Pulmonary effort is normal. No respiratory distress.     Breath sounds: Normal breath sounds. No wheezing or rales.  Chest:      Chest wall: No tenderness.  Breasts:    Right: Normal. No mass, nipple discharge or tenderness.     Left: Normal. No mass, nipple discharge or tenderness.  Abdominal:     General: Bowel sounds are normal. There is no distension.     Palpations: Abdomen is soft. There is no mass.     Tenderness: There is no  abdominal tenderness.     Hernia: There is no hernia in the left inguinal area or right inguinal area.  Genitourinary:    General: Normal vulva.     Pubic Area: No rash.      Labia:        Right: No rash.        Left: No rash.      Vagina: Normal.     Cervix: Normal.     Uterus: Normal.      Adnexa: Right adnexa normal and left adnexa normal.       Right: No tenderness.         Left: No tenderness.    Musculoskeletal:        General: No tenderness. Normal range of motion.     Cervical back: Normal range of motion. No tenderness.  Lymphadenopathy:     Upper Body:     Right upper body: No supraclavicular or axillary adenopathy.     Left upper body: No supraclavicular or axillary adenopathy.  Skin:    General: Skin is warm and dry.  Neurological:     Mental Status: She is alert and oriented to person, place, and time.     Deep Tendon Reflexes: Reflexes are normal and symmetric.        Latest Ref Rng & Units 03/17/2022    4:38 PM 06/01/2021    9:53 AM 12/10/2020    8:37 AM  CMP  Glucose 70 - 99 mg/dL 75  88  81   BUN 8 - 27 mg/dL 7  14  20    Creatinine 0.57 - 1.00 mg/dL 1.06  1.15  1.19   Sodium 134 - 144 mmol/L 143  144  144   Potassium 3.5 - 5.2 mmol/L 3.2  4.0  3.7   Chloride 96 - 106 mmol/L 100  107  102   CO2 20 - 29 mmol/L 26  23  27    Calcium 8.7 - 10.3 mg/dL 9.0  8.7  9.1   Total Protein 6.0 - 8.5 g/dL 7.5  7.4    Total Bilirubin 0.0 - 1.2 mg/dL 0.3  0.3    Alkaline Phos 44 - 121 IU/L 70  83    AST 0 - 40 IU/L 20  15    ALT 0 - 32 IU/L 17  10      Lipid Panel     Component Value Date/Time   CHOL 139 06/01/2021 0953   TRIG 58 06/01/2021 0953   HDL 58  06/01/2021 0953   CHOLHDL 4.7 (H) 08/08/2018 1145   LDLCALC 69 06/01/2021 0953    CBC    Component Value Date/Time   WBC 8.9 01/17/2022 1555   WBC 13.9 (H) 07/09/2020 0124   RBC 4.98 01/17/2022 1555   RBC 4.61 07/09/2020 0124   HGB 11.9 01/17/2022 1555   HCT 38.7 01/17/2022 1555   PLT 116 (L) 01/17/2022 1555   MCV 78 (L) 01/17/2022 1555   MCH 23.9 (L) 01/17/2022 1555   MCH 23.2 (L) 07/09/2020 0124   MCHC 30.7 (L) 01/17/2022 1555   MCHC 32.7 07/09/2020 0124   RDW 17.2 (H) 01/17/2022 1555   LYMPHSABS 2.6 01/17/2022 1555   MONOABS 0.3 03/01/2019 0742   EOSABS 0.0 01/17/2022 1555   BASOSABS 0.0 01/17/2022 1555    Lab Results  Component Value Date   HGBA1C 5.7 03/17/2022    Assessment & Plan:  1. Annual physical exam Counseled on 150 minutes of exercise per week, healthy  eating (including decreased daily intake of saturated fats, cholesterol, added sugars, sodium), STI prevention, routine healthcare maintenance.   2. Screening for cervical cancer Pap smear is not indicated after the age of 58 but given concerns and abnormal Pap smear revealing LSIL in 2021we will proceed per concern - Cytology - PAP    No orders of the defined types were placed in this encounter.   Follow-up: Return in about 6 months (around 10/21/2022) for Chronic medical conditions.       Charlott Rakes, MD, FAAFP. [facility] and [place], [place]   04/20/2022, 11:39 AM

## 2022-04-28 LAB — CYTOLOGY - PAP
Comment: NEGATIVE
Comment: NEGATIVE
Comment: NEGATIVE
Diagnosis: UNDETERMINED — AB
HPV 16: POSITIVE — AB
HPV 18 / 45: NEGATIVE
High risk HPV: POSITIVE — AB

## 2022-05-30 ENCOUNTER — Other Ambulatory Visit: Payer: Self-pay

## 2022-05-30 ENCOUNTER — Other Ambulatory Visit: Payer: Self-pay | Admitting: Family Medicine

## 2022-05-30 DIAGNOSIS — M10072 Idiopathic gout, left ankle and foot: Secondary | ICD-10-CM

## 2022-05-31 ENCOUNTER — Other Ambulatory Visit: Payer: Self-pay

## 2022-05-31 MED ORDER — COLCHICINE 0.6 MG PO TABS
ORAL_TABLET | ORAL | 1 refills | Status: DC
Start: 2022-05-31 — End: 2022-08-10
  Filled 2022-05-31: qty 30, 30d supply, fill #0
  Filled 2022-07-05: qty 30, 30d supply, fill #1

## 2022-07-05 ENCOUNTER — Other Ambulatory Visit: Payer: Self-pay

## 2022-07-05 NOTE — Congregational Nurse Program (Signed)
  Dept: [phone]   Congregational Nurse Program Note  Date of Encounter: 07/05/2022  Past Medical History: Past Medical History:  Diagnosis Date   Acute kidney injury ([facility]) 03/02/2019   Acute respiratory failure with hypoxia ([facility]) 02/22/2019   Atrial fibrillation with RVR ([facility]) 02/26/2019   COVID-19 02/22/2019   Dysrhythmia    para. atrial fibrillation   Flu-like symptoms 02/22/2019   Hypertension    Knee osteoarthritis 10/24/2017   Pneumonia     Encounter Details:  CNP Questionnaire - 07/05/22 1145       Questionnaire   Ask client: Do you give verbal consent for me to treat you today? Yes    Student Assistance N/A;[place] Nurse   [facility] family medicine resident   Location Patient Served  [facility]    Visit Setting with Client Organization    Patient Status Refugee    Insurance Medicaid    Insurance/Financial Assistance Referral N/A    Medication N/A    Medical Provider No    Screening Referrals Made N/A    Medical Referrals Made N/A    Medical Appointment Made N/A    Recently w/o PCP, now 1st time PCP visit completed due to CNs referral or appointment made N/A    Food N/A    Transportation N/A    Housing/Utilities N/A    Interpersonal Safety N/A    Interventions Advocate/Support;Navigate Healthcare System;Educate;Counsel    Abnormal to Normal Screening Since Last CN Visit N/A    Screenings CN Performed N/A    Sent Client to Lab for: N/A    Did client attend any of the following based off CNs referral or appointments made? N/A    ED Visit Averted Yes    Life-Saving Intervention Made N/A            Patient came in today for a blood pressure check. 132/85. Patient also stated she is out of all of her medications so we called the pharmacy for her and they are planning to fill them for her. They will get shipped to her home.  Nicole Cella Mylon Mabey RN BSN PCCN  [facility] [phone]

## 2022-07-06 ENCOUNTER — Other Ambulatory Visit: Payer: Self-pay

## 2022-08-10 ENCOUNTER — Other Ambulatory Visit: Payer: Self-pay

## 2022-08-10 ENCOUNTER — Other Ambulatory Visit: Payer: Self-pay | Admitting: Family Medicine

## 2022-08-10 DIAGNOSIS — M10072 Idiopathic gout, left ankle and foot: Secondary | ICD-10-CM

## 2022-08-10 MED ORDER — COLCHICINE 0.6 MG PO TABS
ORAL_TABLET | ORAL | 0 refills | Status: DC
Start: 2022-08-10 — End: 2022-09-08
  Filled 2022-08-10: qty 90, 45d supply, fill #0

## 2022-08-10 NOTE — Telephone Encounter (Signed)
Requested Prescriptions  Pending Prescriptions Disp Refills   colchicine 0.6 MG tablet 90 tablet 0    Sig: Take 2 tablets by mouth at the onset of a gout flare, repeat 1 tablet in 1 hour if flare continues.     Endocrinology:  Gout Agents - colchicine Failed - 08/10/2022  8:43 AM      Failed - Cr in normal range and within 360 days    Creatinine  Date Value Ref Range Status  03/13/2018 1.26 (H) 0.44 - 1.00 mg/dL Final   Creatinine, Ser  Date Value Ref Range Status  03/17/2022 1.06 (H) 0.57 - 1.00 mg/dL Final         Passed - ALT in normal range and within 360 days    ALT  Date Value Ref Range Status  03/17/2022 17 0 - 32 IU/L Final  03/13/2018 7 0 - 44 U/L Final         Passed - AST in normal range and within 360 days    AST  Date Value Ref Range Status  03/17/2022 20 0 - 40 IU/L Final  03/13/2018 13 (L) 15 - 41 U/L Final         Passed - Valid encounter within last 12 months    Recent Outpatient Visits           3 months ago Annual physical exam   [facility] [place], [place], MD   4 months ago Type 2 diabetes mellitus with obesity (HCC)   [facility] [place], Odette Horns, MD   6 months ago Acute left ankle pain   [facility] [place], [place], MD   10 months ago Type 2 diabetes mellitus with obesity [facility])   [facility] [place], Odette Horns, MD   1 year ago Type 2 diabetes mellitus with obesity [facility])   [facility] Hoy Register, MD       Future Appointments             In 2 months Hoy Register, MD [facility]            Passed - CBC within normal limits and completed in the last 12 months    WBC  Date Value Ref Range Status  01/17/2022 8.9 3.4 - 10.8 x10E3/uL Final  07/09/2020 13.9 (H) 4.0 - 10.5 K/uL Final   RBC  Date Value Ref Range Status  01/17/2022  4.98 3.77 - 5.28 x10E6/uL Final    Comment:    Target cells present.  07/09/2020 4.61 3.87 - 5.11 MIL/uL Final   Hemoglobin  Date Value Ref Range Status  01/17/2022 11.9 11.1 - 15.9 g/dL Final   Hematocrit  Date Value Ref Range Status  01/17/2022 38.7 34.0 - 46.6 % Final   MCHC  Date Value Ref Range Status  01/17/2022 30.7 (L) 31.5 - 35.7 g/dL Final  16/11/9602 54.0 30.0 - 36.0 g/dL Final   [facility]  Date Value Ref Range Status  01/17/2022 23.9 (L) 26.6 - 33.0 pg Final  07/09/2020 23.2 (L) 26.0 - 34.0 pg Final   MCV  Date Value Ref Range Status  01/17/2022 78 (L) 79 - 97 fL Final   No results found for: "PLTCOUNTKUC", "LABPLAT", "POCPLA" RDW  Date Value Ref Range Status  01/17/2022 17.2 (H) 11.7 - 15.4 % Final

## 2022-08-23 ENCOUNTER — Encounter: Payer: Self-pay | Admitting: *Deleted

## 2022-08-23 ENCOUNTER — Ambulatory Visit: Payer: Self-pay

## 2022-08-23 NOTE — Telephone Encounter (Signed)
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 Pt is experiencing Vertigo and feel like when she take a deep breath she cannot complete taking the deep breath.  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 Please call pt to discuss.  Phone numbers for pt: [phone] or [phone]         Began assesment but pt stated she was at school and needs to call back.

## 2022-08-23 NOTE — Congregational Nurse Program (Signed)
  Dept: [phone]   Congregational Nurse Program Note  Date of Encounter: 08/23/2022  Past Medical History: Past Medical History:  Diagnosis Date   Acute kidney injury (HCC) 03/02/2019   Acute respiratory failure with hypoxia ([facility]) 02/22/2019   Atrial fibrillation with RVR ([facility]) 02/26/2019   COVID-19 02/22/2019   Dysrhythmia    para. atrial fibrillation   Flu-like symptoms 02/22/2019   Hypertension    Knee osteoarthritis 10/24/2017   Pneumonia     Encounter Details:  CNP Questionnaire - 08/23/22 1204       Questionnaire   Ask client: Do you give verbal consent for me to treat you today? Yes    Student Assistance N/A    Location Patient Served  [facility]    Visit Setting with Client Organization    Patient Status Refugee    Insurance Uninsured ([place]s/Self-Pay/Medicaid Family Planning)    Insurance/Financial Assistance Referral N/A    Medication N/A    Medical Provider Yes    Screening Referrals Made N/A    Medical Referrals Made [facility] PCP[facility]Clinic    Medical Appointment Made Other    Recently w/o PCP, now 1st time PCP visit completed due to CNs referral or appointment made N/A    Food N/A    Transportation N/A    Housing/Utilities N/A    Interpersonal Safety N/A    Interventions Advocate/Support;Navigate Healthcare System;Counsel;Educate    Abnormal to Normal Screening Since Last CN Visit N/A    Screenings CN Performed N/A    Sent Client to Lab for: N/A    Did client attend any of the following based off CNs referral or appointments made? N/A    ED Visit Averted Yes    Life-Saving Intervention Made N/A            Client came into nurse clinic wanting some glasses.  Client then said she is having vertigo and feels like she cannot get a deep breath.  She does not appear in any acute distress.  Client weight was 219 lbs.  Called [facility] and the nurse for Dr. Alvis Lemmings will call client to triage client and/or schedule an appointment.  I will follow up with client  as requested.  Roderic Palau, RN, MSN, CNP [phone] Office [phone] Cell

## 2022-08-23 NOTE — Telephone Encounter (Signed)
Using [employer] [phone].  Chief Complaint: SOB Symptoms: SOB ,cough, chest pain with cough, dizzy Frequency: Comes and goes for over a month Pertinent Negatives: Patient denies fever, nausea, vomiting Disposition: [] ED /[] Urgent Care (no appt availability in office) / [x] Appointment(In office/virtual)/ []  [facility]/ [] Home Care/ [] Refused Recommended Disposition /[] [facility] [place] [facility]/ []  Follow-up with PCP Additional Notes: Patient stated that she has been feeling SOB for over one month now and it comes and goes. Patient states that sometimes she has a cough with chest pain but the chest pain goes away with the cough. Patient also reports dizziness. Advised patient that she would need to be evaluated. Patient is agreeable but has to set up transportation with a friend. Patient has been scheduled for 09/08/22 in the office. Advised if SOB or chest pain gets worse to go to UC or ED right away. 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 Pt is experiencing Vertigo and feel like when she take a deep breath she cannot complete taking the deep breath.  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 Please call pt to discuss.  Phone numbers for pt: [phone] or [phone]     Reason for Disposition  [1] MODERATE longstanding difficulty breathing (e.g., speaks in phrases, SOB even at rest, pulse 100-120) AND [2] SAME as normal  Answer Assessment - Initial Assessment Questions 1. RESPIRATORY STATUS: "Describe your breathing?" (e.g., wheezing, shortness of breath, unable to speak, severe coughing)      SO coughing 2. ONSET: "When did this breathing problem begin?"      Over a month 3. PATTERN "Does the difficult breathing come and go, or has it been constant since it started?"      Comes and goes  4. SEVERITY: "How bad is your  breathing?" (e.g., mild, moderate, severe)    - MILD: No SOB at rest, mild SOB with walking, speaks normally in sentences, can lie down, no retractions, pulse < 100.    - MODERATE: SOB at rest, SOB with minimal exertion and prefers to sit, cannot lie down flat, speaks in phrases, mild retractions, audible wheezing, pulse 100-120.    - SEVERE: Very SOB at rest, speaks in single words, struggling to breathe, sitting hunched forward, retractions, pulse > 120      Mild  5. RECURRENT SYMPTOM: "Have you had difficulty breathing before?" If Yes, ask: "When was the last time?" and "What happened that time?"      No 6. CARDIAC HISTORY: "Do you have any history of heart disease?" (e.g., heart attack, angina, bypass surgery, angioplasty)      Heart disease 7. LUNG HISTORY: "Do you have any history of lung disease?"  (e.g., pulmonary embolus, asthma, emphysema)     No 8. CAUSE: "What do you think is causing the breathing problem?"      I don't know 9. OTHER SYMPTOMS: "Do you have any other symptoms? (e.g., dizziness, runny nose, cough, chest pain, fever)     Dizziness, cough, chest pain                 12. TRAVEL: "Have you traveled out of the country in the last month?" (e.g., travel history, exposures)       No  Protocols used: Breathing Difficulty-A-AH

## 2022-09-08 ENCOUNTER — Ambulatory Visit: Payer: Self-pay | Attending: Physician Assistant | Admitting: Physician Assistant

## 2022-09-08 ENCOUNTER — Other Ambulatory Visit: Payer: Self-pay

## 2022-09-08 VITALS — BP 142/87 | HR 65 | Resp 14 | Ht 63.0 in | Wt 217.0 lb

## 2022-09-08 DIAGNOSIS — E7800 Pure hypercholesterolemia, unspecified: Secondary | ICD-10-CM

## 2022-09-08 DIAGNOSIS — R0609 Other forms of dyspnea: Secondary | ICD-10-CM

## 2022-09-08 DIAGNOSIS — I10 Essential (primary) hypertension: Secondary | ICD-10-CM

## 2022-09-08 DIAGNOSIS — R0789 Other chest pain: Secondary | ICD-10-CM

## 2022-09-08 DIAGNOSIS — Z758 Other problems related to medical facilities and other health care: Secondary | ICD-10-CM

## 2022-09-08 DIAGNOSIS — E876 Hypokalemia: Secondary | ICD-10-CM

## 2022-09-08 DIAGNOSIS — B353 Tinea pedis: Secondary | ICD-10-CM

## 2022-09-08 DIAGNOSIS — Z760 Encounter for issue of repeat prescription: Secondary | ICD-10-CM | POA: Insufficient documentation

## 2022-09-08 DIAGNOSIS — M10072 Idiopathic gout, left ankle and foot: Secondary | ICD-10-CM

## 2022-09-08 DIAGNOSIS — Z603 Acculturation difficulty: Secondary | ICD-10-CM

## 2022-09-08 DIAGNOSIS — I482 Chronic atrial fibrillation, unspecified: Secondary | ICD-10-CM

## 2022-09-08 MED ORDER — COLCHICINE 0.6 MG PO TABS
ORAL_TABLET | ORAL | 0 refills | Status: DC
Start: 2022-09-08 — End: 2022-11-28
  Filled 2022-09-08: qty 90, fill #0
  Filled 2022-11-07: qty 90, 30d supply, fill #0

## 2022-09-08 MED ORDER — POTASSIUM CHLORIDE ER 10 MEQ PO TBCR
10.0000 meq | EXTENDED_RELEASE_TABLET | Freq: Every day | ORAL | 1 refills | Status: DC
Start: 2022-09-08 — End: 2022-09-27
  Filled 2022-09-08: qty 90, 90d supply, fill #0

## 2022-09-08 MED ORDER — CLOTRIMAZOLE 1 % EX CREA
1.0000 | TOPICAL_CREAM | Freq: Two times a day (BID) | CUTANEOUS | 1 refills | Status: DC
Start: 2022-09-08 — End: 2022-09-27
  Filled 2022-09-08: qty 60, 30d supply, fill #0

## 2022-09-08 MED ORDER — AMLODIPINE BESYLATE 5 MG PO TABS
5.0000 mg | ORAL_TABLET | Freq: Every day | ORAL | 1 refills | Status: DC
  Filled 2022-09-08: qty 90, 90d supply, fill #0
  Filled 2022-11-28: qty 90, 90d supply, fill #1

## 2022-09-08 MED ORDER — DILTIAZEM HCL ER COATED BEADS 120 MG PO CP24
120.0000 mg | ORAL_CAPSULE | Freq: Every day | ORAL | 1 refills | Status: DC
Start: 2022-09-08 — End: 2022-09-27
  Filled 2022-09-08: qty 90, 90d supply, fill #0

## 2022-09-08 MED ORDER — ROSUVASTATIN CALCIUM 10 MG PO TABS
10.0000 mg | ORAL_TABLET | Freq: Every day | ORAL | 1 refills | Status: DC
Start: 2022-09-08 — End: 2022-12-28
  Filled 2022-09-08 – 2022-11-07 (×2): qty 90, 90d supply, fill #0

## 2022-09-08 NOTE — Progress Notes (Signed)
Pt is here for breathing issues   Complaining of trouble breathing while walking and feels her heart beating very fast per pt  Needs refills on her medications

## 2022-09-08 NOTE — Progress Notes (Signed)
Patient ID: [name] [name], female   DOB: 1950-03-08, 72 y.o.   MRN: [id number]   Aroha Bufano, is a 72 y.o. female  WGN:[id number]  HQI:[id number]  DOB - July 18, 1950  Chief Complaint  Patient presents with   Breathing Problem       Subjective:   [name] [name] is a 72 y.o. female here today for some chest pain and pressure over the last month or so with DOE.  No weight gain.  No worsening edema.  Not currently having chest pain.  She has a friend that is translating-they declined [employer].  Her friend goes to all of her appts with her.  Patient has been lost to cardiology follow up and has not been seen or evaluated by them since 05/2021.  She says last time she went, they told her she did not need to go back.  However,  it says in the note "follow up 6 months."  No claudication.  Not currently short of breath.  No symptoms currently.  She needs RF on some of her meds.    No problems updated.  ALLERGIES: No Known Allergies  PAST MEDICAL HISTORY: Past Medical History:  Diagnosis Date   Acute kidney injury (HCC) 03/02/2019   Acute respiratory failure with hypoxia (HCC) 02/22/2019   Atrial fibrillation with RVR (HCC) 02/26/2019   COVID-19 02/22/2019   Dysrhythmia    para. atrial fibrillation   Flu-like symptoms 02/22/2019   Hypertension    Knee osteoarthritis 10/24/2017   Pneumonia     MEDICATIONS AT HOME: Prior to Admission medications   Medication Sig Start Date End Date Taking? Authorizing Provider  apixaban (ELIQUIS) 5 MG TABS tablet Take 1 tablet (5 mg total) by mouth 2 (two) times daily. 03/17/22  Yes Hoy Register, MD  diclofenac Sodium (VOLTAREN) 1 % GEL Apply 4 g topically 4 (four) times daily. For pain 09/22/21  Yes Hoy Register, MD  gabapentin (NEURONTIN) 100 MG capsule Take 2 capsules (200 mg total) by mouth 3 (three) times daily. 03/17/22 03/17/23 Yes Newlin, Odette Horns, MD  olopatadine (PATANOL) 0.1 % ophthalmic solution Place 1 drop into both eyes  2 (two) times daily. 06/01/21  Yes Hoy Register, MD  amLODipine (NORVASC) 5 MG tablet Take 1 tablet (5 mg total) by mouth daily. 09/08/22   Anders Simmonds, PA-C  clotrimazole (LOTRIMIN) 1 % cream Apply 1 Application topically 2 (two) times daily. Apply to feet 09/08/22   Anders Simmonds, PA-C  colchicine 0.6 MG tablet Take 2 tablets by mouth at the onset of a gout flare, repeat 1 tablet in 1 hour if flare continues. 09/08/22   Anders Simmonds, PA-C  diltiazem (CARDIZEM CD) 120 MG 24 hr capsule Take 1 capsule (120 mg total) by mouth daily. 09/08/22   Anders Simmonds, PA-C  potassium chloride (KLOR-CON 10) 10 MEQ tablet Take 1 tablet (10 mEq total) by mouth daily. 09/08/22   Anders Simmonds, PA-C  rosuvastatin (CRESTOR) 10 MG tablet Take 1 tablet (10 mg total) by mouth daily. To lower cholesterol 09/08/22   , Marylene Land M, PA-C    ROS: Neg HEENT Neg GI Neg GU Neg MS Neg psych Neg neuro  Objective:   Vitals:   09/08/22 0944  BP: (!) 142/87  Pulse: 65  Resp: 14  SpO2: 95%  Weight: 217 lb (98.4 kg)  Height: 5\' 3"  (1.6 m)   Exam General appearance : Awake, alert, not in any distress. Speech Clear. Not toxic looking; obese.  Weight  is stable for her(no gain) HEENT: Atraumatic and Normocephalic Neck: Supple, no JVD. No cervical lymphadenopathy.  Chest: Good air entry bilaterally, CTAB.  No rales/rhonchi/wheezing CVS: S1 S2 regular, no murmurs.  Extremities: B/L Lower Ext shows <1 edema, both legs are warm to touch Neurology: Awake alert, and oriented X 3, CN II-XII intact, Non focal Skin: No Rash  Data Review Lab Results  Component Value Date   HGBA1C 5.7 03/17/2022   HGBA1C 5.6 09/22/2021   HGBA1C 5.7 06/01/2021    Assessment & Plan   1. DOE (dyspnea on exertion) No rales on exam.  To ED if worsens or become severe.  Not symptomatic here today - Comprehensive metabolic panel - TSH - CBC with Differential/Platelet - Brain natriuretic peptide - Ambulatory referral to  Cardiology I have placed a referral to cardiology.  Please call your cardiologist to schedule an appointment. Kathleene Hazel, MD Cardiology [facility] Office  Phone: [phone]  2. Other chest pain To ED if worsens or becomes severe.  Patient verbalizes understanding No acute ST changes.  She does have some changes that will need further assessment with cardiology.  Reviewed EKG with Dr Rosey Bath, MD at 12:56pm 09/08/2022 I have placed a referral to cardiology.  Please call your cardiologist to schedule an appointment. Kathleene Hazel, MD Cardiology [facility] Office  Phone: [phone] - Comprehensive metabolic panel - TSH - CBC with Differential/Platelet - Brain natriuretic peptide - Ambulatory referral to Cardiology  3. Essential hypertension DASH diet - amLODipine (NORVASC) 5 MG tablet; Take 1 tablet (5 mg total) by mouth daily.  Dispense: 90 tablet; Refill: 1 - diltiazem (CARDIZEM CD) 120 MG 24 hr capsule; Take 1 capsule (120 mg total) by mouth daily.  Dispense: 90 capsule; Refill: 1 - rosuvastatin (CRESTOR) 10 MG tablet; Take 1 tablet (10 mg total) by mouth daily. To lower cholesterol  Dispense: 90 tablet; Refill: 1  4. Tinea pedis of both feet - clotrimazole (LOTRIMIN) 1 % cream; Apply 1 Application topically 2 (two) times daily. Apply to feet  Dispense: 60 g; Refill: 1  5. Chronic atrial fibrillation (HCC) Controlled on - diltiazem (CARDIZEM CD) 120 MG 24 hr capsule; Take 1 capsule (120 mg total) by mouth daily.  Dispense: 90 capsule; Refill: 1  6. Hypercholesterolemia - rosuvastatin (CRESTOR) 10 MG tablet; Take 1 tablet (10 mg total) by mouth daily. To lower cholesterol  Dispense: 90 tablet; Refill: 1 - Comprehensive metabolic panel  7.  idiopathic gout of left foot - colchicine 0.6 MG tablet; Take 2 tablets by mouth at the onset of a gout flare, repeat 1 tablet in 1 hour if flare continues.  Dispense: 90 tablet; Refill:  0  8. Hypokalemia - potassium chloride (KLOR-CON 10) 10 MEQ tablet; Take 1 tablet (10 mEq total) by mouth daily.  Dispense: 90 tablet; Refill: 1  9. Language barrier additional time performing visit was required.     Return in about 4 months (around 01/08/2023) for chronic conditions with Dr Alvis Lemmings.  The patient was given clear instructions to go to ER or return to medical center if symptoms don't improve, worsen or new problems develop. The patient verbalized understanding. The patient was told to call to get lab results if they haven't heard anything in the next week.      Georgian Co, PA-C [facility] [place], [place] [phone]   09/08/2022, 12:36 PM

## 2022-09-08 NOTE — Patient Instructions (Addendum)
I have placed a referral to cardiology.  Please call your cardiologist to schedule an appointment.  Kathleene Hazel, MD Cardiology [facility] Office  Phone: [phone]

## 2022-09-09 ENCOUNTER — Other Ambulatory Visit: Payer: Self-pay

## 2022-09-12 ENCOUNTER — Ambulatory Visit: Payer: Self-pay | Admitting: Physician Assistant

## 2022-09-13 ENCOUNTER — Ambulatory Visit: Payer: Self-pay | Attending: Family Medicine

## 2022-09-14 ENCOUNTER — Other Ambulatory Visit: Payer: Self-pay | Admitting: Physician Assistant

## 2022-09-14 DIAGNOSIS — D696 Thrombocytopenia, unspecified: Secondary | ICD-10-CM

## 2022-09-15 ENCOUNTER — Telehealth: Payer: Self-pay

## 2022-09-15 NOTE — Telephone Encounter (Signed)
-----   Message from Georgian Co sent at 09/14/2022  5:09 PM EDT ----- Please call patient.  Her platelets are very low. This can be a risk factor for bleeding. Avoid drinking any alcohol and do not take ANY products that contain aspirin.  Make sure she has scheduled a f/up appt with cardiology.  Make her an appointment to recheck platelets in 2 to 3 weeks.  If she notices any unusual bleeding before then, go to the hospital(uncontrollable nose bleeds, vomiting blood, or blood in bowel movements).  Drink more water to improve kidney function. Thyroid is normal.  Thanks, Georgian Co, PA-C

## 2022-09-15 NOTE — Telephone Encounter (Signed)
Pt and husband was called and is aware of results, DOB was confirmed.

## 2022-09-21 ENCOUNTER — Telehealth: Payer: Self-pay | Admitting: *Deleted

## 2022-09-21 NOTE — Telephone Encounter (Signed)
  Chief Complaint: Labs Symptoms: na Frequency: na Pertinent Negatives: Patient denies na Disposition: [] ED /[] Urgent Care (no appt availability in office) / [] Appointment(In office/virtual)/ []  [facility]/ [] Home Care/ [] Refused Recommended Disposition /[] [facility]/ []  Follow-up with PCP Additional Notes:   'Minerva Areola' with [employer]. States he received an Email at 2pm today from their Customer Service Group regarding pt's BNP from 09/14/22. States he was to [employer] a [employer].  Minerva Areola states the BNP had hemolyzed and was "Dumped" today.  Unsure who this is to go to. Please advise.

## 2022-09-21 NOTE — Telephone Encounter (Signed)
FYI

## 2022-09-26 NOTE — Congregational Nurse Program (Signed)
Confirmed appointment time for patient with cardiologist. She has following concerns to address at appointment:  1) reporting shortness of breath at night when lying down and when walking 2) reported weight gain 3) Reported pain in bilateral upper thighs.  No distress at this time, breathing unlabored. Mild edema to bilateral ankles noted. States shs is taking medication as directed. Checked weight 99 kg. Client states prior read was 85 kg. BP WNL, heart rate 50.

## 2022-09-26 NOTE — Progress Notes (Unsigned)
Cardiology Office Note:  .   Date:  09/27/2022  ID:  [name] [name], DOB Sep 30, 1950, MRN [id number] PCP: Hoy Register, MD  [facility] Providers Cardiologist:  Verne Carrow, MD    History of Present Illness: Marland Kitchen   [name] [name] is a 72 y.o. female with past medical history of paroxysmal atrial fibrillation, HTN, CKD, gout, DM type 2 and arthritis.   In 02/2019 she was seen in the hospital by Dr. Mayford Knife for new onset atrial fibrillation with RVR.  She was admitted with COVID-19 related pneumonia.  She was rate controlled with Cardizem and was started on Eliquis echocardiogram at that time indicated LVEF of 60 to 65% with mild MR. On hospital follow up in 05/2019 she was noted to be in sinus rhythm.   She was last seen in office in 05/2021 by Dr. Clifton James, she was stable from a cardiac perspective at that time.  She was in sinus rhythm was continued on Eliquis and Cardizem.  It was recommended that she have follow-up in 6 months.   She was seen by her PCP on 09/08/2022 reporting some chest pain and pressure over the prior month with shortness of breath.  Overall her lab work was unremarkable although her platelets were low at 9 although it was noted to have some platelet aggregation in sample, PCP plans for repeat. BNP was unable to be analyzed do to hemolysis, was canceled. She was seen 09/26/22 by her congregational nurse that reported increased shortness of breath at night and when walking as well as reported weight gain.   Today she presents for follow up.  She reports that she has been having increased dyspnea on exertion, palpitations, dizziness and lightheadedness. She also notes intermittent chest discomfort at night that she associates with her palpitations, which are described as a hard beat or skipped beat. She endorses dizziness and lightheadedness during the day, and feeling as though her heart "not working well".   ROS: Today she denies lower extremity  edema, fatigue, melena, hematuria, hemoptysis, diaphoresis, weakness, syncope, and PND.   Studies Reviewed: Marland Kitchen   EKG Interpretation Date/Time:  Tuesday September 27 2022 14:47:22 EDT Ventricular Rate:  46 PR Interval:  144 QRS Duration:  104 QT Interval:  468 QTC Calculation: 409 R Axis:   71  Text Interpretation: sinus bradycardia, ectopic atrial rhythm Septal infarct (cited on or before 08-Sep-2022) Confirmed by Reather Littler [phone]) on 09/27/2022 2:57:23 PM   Cardiac Studies & Procedures       ECHOCARDIOGRAM  ECHOCARDIOGRAM LIMITED 02/28/2019  Narrative ECHOCARDIOGRAM LIMITED REPORT    Patient Name:   [name] [name] Date of Exam: 02/28/2019 Medical Rec #:  [id number]              Height:       64.0 in Accession #:    [id number]             Weight:       201.9 lb Date of Birth:  [birth year]             BSA:          1.96 m Patient Age:    68 years               BP:           109/88 mmHg Patient Gender: F                      HR:  67 bpm. Exam Location:  Inpatient   Procedure: 2D Echo  Indications:    Atrial fibrillation  History:        Patient has no prior history of Echocardiogram examinations.  Sonographer:    Ross Ludwig RDCS (AE) Referring Phys: [id number] Darlin Drop   Sonographer Comments: COVID-19. IMPRESSIONS   1. Left ventricular ejection fraction, by visual estimation, is 60 to 65%. Left ventricular septal wall thickness was moderately increased. Moderately increased left ventricular posterior wall thickness. There is moderately increased left ventricular wall thickness. 2. Left atrial size was severely dilated. 3. Right atrial size was mildly dilated. 4. Mild mitral valve regurgitation. 5. Tricuspid valve regurgitation is mild. 6. Tricuspid valve regurgitation is mild. 7. Mildly elevated pulmonary artery systolic pressure. 8. The inferior vena cava is dilated in size with >50% respiratory variability, suggesting right atrial pressure of 8  mmHg.  FINDINGS Left Ventricle: Left ventricular ejection fraction, by visual estimation, is 60 to 65%. Left ventricular septal wall thickness was moderately increased. Moderately increased left ventricular posterior wall thickness. There is moderately increased left ventricular wall thickness.  Right Ventricle: The tricuspid regurgitant velocity is 2.41 m/s, and with an assumed right atrial pressure of 8 mmHg, the estimated right ventricular systolic pressure is mildly elevated at 31.2 mmHg.  Left Atrium: Left atrial size was severely dilated.  Right Atrium: Right atrial size was mildly dilated. Right atrial pressure is estimated at 8 mmHg.  Mitral Valve: Mild mitral valve regurgitation.  Tricuspid Valve: Tricuspid valve regurgitation is mild.  Aortic Valve: The aortic valve is tricuspid. Aortic valve mean gradient measures 3.4 mmHg. Aortic valve peak gradient measures 6.5 mmHg. Aortic valve area, by VTI measures 1.67 cm.  Venous: The inferior vena cava is dilated in size with greater than 50% respiratory variability, suggesting right atrial pressure of 8 mmHg.   LEFT VENTRICLE          Normals PLAX 2D LVIDd:         3.90 cm  3.6 cm LVIDs:         2.46 cm  1.7 cm LV PW:         1.57 cm  1.4 cm LV IVS:        1.66 cm  1.3 cm LVOT diam:     1.80 cm  2.0 cm LV SV:         44 ml    79 ml LV SV Index:   21.45    45 ml/m2 LVOT Area:     2.54 cm 3.14 cm2   IVC IVC diam: 2.96 cm  LEFT ATRIUM              Index       RIGHT ATRIUM           Index LA diam:        3.20 cm  1.63 cm/m  RA Area:     20.20 cm LA Vol (A2C):   95.1 ml  48.41 ml/m RA Volume:   58.00 ml  29.53 ml/m LA Vol (A4C):   123.0 ml 62.61 ml/m LA Biplane Vol: 110.0 ml 56.00 ml/m AORTIC VALVE                   Normals AV Area (Vmax):    1.65 cm AV Area (Vmean):   1.65 cm    3.06 cm2 AV Area (VTI):     1.67 cm AV Vmax:  127.00 cm/s AV Vmean:          82.960 cm/s 77 cm/s AV VTI:            0.203 m      3.15 cm2 AV Peak Grad:      6.5 mmHg AV Mean Grad:      3.4 mmHg    3 mmHg LVOT Vmax:         82.38 cm/s LVOT Vmean:        53.800 cm/s 75 cm/s LVOT VTI:          0.133 m     25.3 cm LVOT/AV VTI ratio: 0.66        1  AORTA                 Normals Ao Root diam: 3.00 cm 31 mm  TRICUSPID VALVE             Normals TR Peak grad:   23.2 mmHg TR Vmax:        309.00 cm/s 288 cm/s  SHUNTS Systemic VTI:  0.13 m Systemic Diam: 1.80 cm   Chilton Si MD Electronically signed by Chilton Si MD Signature Date/Time: 02/28/2019/6:59:37 PM    Final              Risk Assessment/Calculations:    CHA2DS2-VASc Score = 4   This indicates a 4.8% annual risk of stroke. The patient's score is based upon: CHF History: 0 HTN History: 1 Diabetes History: 1 Stroke History: 0 Vascular Disease History: 0 Age Score: 1 Gender Score: 1            Physical Exam:   VS:  BP 130/62   Pulse (!) 46   Ht 5\' 3"  (1.6 m)   Wt 217 lb 6.4 oz (98.6 kg)   LMP  (LMP Unknown)   SpO2 96%   BMI 38.51 kg/m    Wt Readings from Last 3 Encounters:  09/27/22 217 lb 6.4 oz (98.6 kg)  09/26/22 218 lb 4.1 oz (99 kg)  09/08/22 217 lb (98.4 kg)    GEN: Well nourished, well developed in no acute distress NECK: No JVD; No carotid bruits CARDIAC: RRR, no murmurs, rubs, gallops RESPIRATORY:  Clear to auscultation without rales, wheezing or rhonchi  ABDOMEN: Soft, non-tender, non-distended EXTREMITIES: Mild bilateral lower extremity edema; No deformity   ASSESSMENT AND PLAN: .    Chest pain: Today she reports 2 months of intermittent chest discomfort mostly at night.  She feels more chest discomfort during episodes of palpitations.  EKG today shows sinus bradycardia, ectopic atrial rhythm no significant ST/T wave changes compared to prior EKGs aside from EKG on 09/08/2022 which may have resulted from lead placement.  Reviewed ED precautions. Check echocardiogram as well as [facility], which she is  agreeable to see consent below.  PAF/Palpitations/Bradycardia/dizziness: History of PAF in 02/2019 during admission with Covid-19.  Today reports palpitations that occur mostly at night lasting a few beats that are felt as harder or skipped beats, associated with some chest discomfort. EKG today shows sinus bradycardia with ectopic atrial rhythm at 46 bpm.  She endorses intermittent dizziness and lightheadedness throughout the day.  Recent lab work shows no evidence of anemia, infection or electrolyte imbalance.  TSH was normal.  Given symptoms and bradycardia will stop her Cardizem. CHA2DS2-VASc Score = 4 . Therefore, the patient's annual risk of stroke is 4.8 %.  Stop Cardizem, continue Eliquis.  Check 2-week Zio monitor for palpitations  and bradycardia.  Check magnesium level.   Chronic diastolic heart failure/shortness of breath: Last echocardiogram on 02/2019 showed LVEF of 60 to 65%, LV septal wall thickness was moderately increased, left atrial size was severely dilated, mild MR and TR. Today she reports dyspnea on exertion and some difficulty breathing at night.  She denies lower extremity edema although some mild edema noted at the ankles on exam.  Lung sounds are clear.  On review appears weights have remained stable.  Check BNP today. Repeat complete echocardiogram.    HTN: Blood pressure today 130/62.  Cardizem stopped today for bradycardia, will need to monitor blood pressure in the future.  If elevated can increase amlodipine. Continue amlodipine 5mg  daily.    Thrombocytopenia: Platelet count 76 on 09/13/22, noted to have platelet aggregation in sample possible false low.  PCP has ordered repeat CBC for monitoring. On Eliquis 5 mg twice daily  Hyperlipidemia: Last lipid profile on 05/2021 showed total cholesterol of 139, HDL 58, triglycerides 58 and LDL of 69. Will need fasting lipid profile on follow up.   Language barrier: Interpreter was present for entire visit.    Informed Consent    Shared Decision Making/Informed Consent The risks [chest pain, shortness of breath, cardiac arrhythmias, dizziness, blood pressure fluctuations, myocardial infarction, stroke/transient ischemic attack, nausea, vomiting, allergic reaction, radiation exposure, metallic taste sensation and life-threatening complications (estimated to be 1 in 10,000)], benefits (risk stratification, diagnosing coronary artery disease, treatment guidance) and alternatives of a nuclear stress test were discussed in detail with [name] and she agrees to proceed.     Dispo: Follow up with Dr. Clifton James or Tereso Newcomer, PA following testing.   Signed, Rip Harbour, NP

## 2022-09-27 ENCOUNTER — Ambulatory Visit (INDEPENDENT_AMBULATORY_CARE_PROVIDER_SITE_OTHER): Payer: Self-pay

## 2022-09-27 ENCOUNTER — Ambulatory Visit: Payer: Self-pay | Attending: Physician Assistant | Admitting: Cardiology

## 2022-09-27 ENCOUNTER — Encounter: Payer: Self-pay | Admitting: Physician Assistant

## 2022-09-27 ENCOUNTER — Encounter: Payer: Self-pay | Admitting: *Deleted

## 2022-09-27 VITALS — BP 130/62 | HR 46 | Ht 63.0 in | Wt 217.4 lb

## 2022-09-27 DIAGNOSIS — I10 Essential (primary) hypertension: Secondary | ICD-10-CM

## 2022-09-27 DIAGNOSIS — R002 Palpitations: Secondary | ICD-10-CM

## 2022-09-27 DIAGNOSIS — Z7901 Long term (current) use of anticoagulants: Secondary | ICD-10-CM

## 2022-09-27 DIAGNOSIS — D696 Thrombocytopenia, unspecified: Secondary | ICD-10-CM

## 2022-09-27 DIAGNOSIS — I480 Paroxysmal atrial fibrillation: Secondary | ICD-10-CM

## 2022-09-27 DIAGNOSIS — R42 Dizziness and giddiness: Secondary | ICD-10-CM

## 2022-09-27 DIAGNOSIS — R0602 Shortness of breath: Secondary | ICD-10-CM

## 2022-09-27 DIAGNOSIS — Z603 Acculturation difficulty: Secondary | ICD-10-CM

## 2022-09-27 DIAGNOSIS — Z758 Other problems related to medical facilities and other health care: Secondary | ICD-10-CM

## 2022-09-27 DIAGNOSIS — R079 Chest pain, unspecified: Secondary | ICD-10-CM

## 2022-09-27 NOTE — Progress Notes (Unsigned)
Enrolled for [employer] to mail a ZIO XT long term holter monitor to the patients address on file.   Letter with instructions [place] and [name] mailed to patient.  Dr. Clifton James to read.

## 2022-09-27 NOTE — Patient Instructions (Addendum)
Medication Instructions:  Your physician has recommended you make the following change in your medication:   STOP Cardizem  ou need a refill on your cardiac medications before your next appointment, please call your pharmacy*   Lab Work: TODAY:  PRO BNP & MAGNESIUM  If you have labs (blood work) drawn today and your tests are completely normal, you will receive your results only by: MyChart Message (if you have MyChart) OR A paper copy in the mail If you have any lab test that is abnormal or we need to change your treatment, we will call you to review the results.   Testing/Procedures: Your physician has requested that you have an echocardiogram. Echocardiography is a painless test that uses sound waves to create images of your heart. It provides your doctor with information about the size and shape of your heart and how well your heart's chambers and valves are working. This procedure takes approximately one hour. There are no restrictions for this procedure. Please do NOT wear cologne, perfume, aftershave, or lotions (deodorant is allowed). Please arrive 15 minutes prior to your appointment time.   Your physician has requested that you have a lexiscan myoview. For further information please visit [email or web address] Please follow instruction sheet, BELOW:    You are scheduled for a Myocardial Perfusion Imaging Study. Please arrive 15 minutes prior to your appointment time for registration and insurance purposes.  The test will take approximately 3 to 4 hours to complete; you may bring reading material.  If someone comes with you to your appointment, they will need to remain in the main lobby due to limited space in the testing area. **If you are pregnant or breastfeeding, please notify the nuclear lab prior to your appointment**  How to prepare for your Myocardial Perfusion Test: Do not eat or drink 3 hours prior to your test, except you may have water. Do not consume products  containing caffeine (regular or decaffeinated) 12 hours prior to your test. (ex: coffee, chocolate, sodas, tea). Do bring a list of your current medications with you.  If not listed below, you may take your medications as normal. Do wear comfortable clothes (no dresses or overalls) and walking shoes, tennis shoes preferred (No heels or open toe shoes are allowed). Do NOT wear cologne, perfume, aftershave, or lotions (deodorant is allowed). If these instructions are not followed, your test will have to be rescheduled.    ZIO XT- Long Term Monitor Instructions  Your physician has requested you wear a ZIO patch monitor for 14 days.  This is a single patch monitor. [employer] supplies one patch monitor per enrollment. Additional stickers are not available. Please do not apply patch if you will be having a Nuclear Stress Test,  Echocardiogram, Cardiac CT, MRI, or Chest Xray during the period you would be wearing the  monitor. The patch cannot be worn during these tests. You cannot remove and re-apply the  ZIO XT patch monitor.  Your ZIO patch monitor will be mailed 3 day USPS to your address on file. It may take 3-5 days  to receive your monitor after you have been enrolled.  Once you have received your monitor, please review the enclosed instructions. Your monitor  has already been registered assigning a specific monitor serial # to you.  Billing and Patient Assistance Program Information  We have supplied [employer] with any of your insurance information on file for billing purposes. [employer] offers a sliding scale Patient Assistance Program for patients that do not have  insurance, or whose insurance does not completely cover the cost of the ZIO monitor.  You must apply for the Patient Assistance Program to qualify for this discounted rate.  To apply, please call [employer] at [phone], select option 4, select option 2, ask to apply for  Patient Assistance Program. Meredeth Ide will ask your household  income, and how many people  are in your household. They will quote your out-of-pocket cost based on that information.  [employer] will also be able to set up a 42-month, interest-free payment plan if needed.  Applying the monitor   Shave hair from upper left chest.  Hold abrader disc by orange tab. Rub abrader in 40 strokes over the upper left chest as  indicated in your monitor instructions.  Clean area with 4 enclosed alcohol pads. Let dry.  Apply patch as indicated in monitor instructions. Patch will be placed under collarbone on left  side of chest with arrow pointing upward.  Rub patch adhesive wings for 2 minutes. Remove white label marked "1". Remove the white  label marked "2". Rub patch adhesive wings for 2 additional minutes.  While looking in a mirror, press and release button in center of patch. A small green light will  flash 3-4 times. This will be your only indicator that the monitor has been turned on.  Do not shower for the first 24 hours. You may shower after the first 24 hours.  Press the button if you feel a symptom. You will hear a small click. Record Date, Time and  Symptom in the Patient Logbook.  When you are ready to remove the patch, follow instructions on the last 2 pages of Patient  Logbook. Stick patch monitor onto the last page of Patient Logbook.  Place Patient Logbook in the blue and white box. Use locking tab on box and tape box closed  securely. The blue and white box has prepaid postage on it. Please place it in the mailbox as  soon as possible. Your physician should have your test results approximately 7 days after the  monitor has been mailed back to [facility].  Call [facility] Customer Care at [phone] if you have questions regarding  your ZIO XT patch monitor. Call them immediately if you see an orange light blinking on your  monitor.  If your monitor falls off in less than 4 days, contact our Monitor department at [phone].  If your  monitor becomes loose or falls off after 4 days call [employer] at [phone] for  suggestions on securing your monitor    Follow-Up: At [facility], you and your health needs are our priority.  As part of our continuing mission to provide you with exceptional heart care, we have created designated Provider Care Teams.  These Care Teams include your primary Cardiologist (physician) and Advanced Practice Providers (APPs -  Physician Assistants and Nurse Practitioners) who all work together to provide you with the care you need, when you need it.  We recommend signing up for the patient portal called "MyChart".  Sign up information is provided on this After Visit Summary.  MyChart is used to connect with patients for Virtual Visits (Telemedicine).  Patients are able to view lab/test results, encounter notes, upcoming appointments, etc.  Non-urgent messages can be sent to your provider as well.   To learn more about what you can do with MyChart, go to [email or web address].    Your next appointment:   AFTER TESTING  Provider:   Verne Carrow, MD  or Tereso Newcomer, PA-C         Other Instructions

## 2022-09-28 ENCOUNTER — Other Ambulatory Visit: Payer: Self-pay

## 2022-09-28 LAB — MAGNESIUM: Magnesium: 2.2 mg/dL (ref 1.6–2.3)

## 2022-09-28 LAB — PRO B NATRIURETIC PEPTIDE: NT-Pro BNP: 283 pg/mL (ref 0–301)

## 2022-10-05 ENCOUNTER — Other Ambulatory Visit: Payer: Self-pay

## 2022-10-06 DIAGNOSIS — R002 Palpitations: Secondary | ICD-10-CM

## 2022-10-06 DIAGNOSIS — I10 Essential (primary) hypertension: Secondary | ICD-10-CM

## 2022-10-06 DIAGNOSIS — I480 Paroxysmal atrial fibrillation: Secondary | ICD-10-CM

## 2022-10-06 DIAGNOSIS — Z7901 Long term (current) use of anticoagulants: Secondary | ICD-10-CM

## 2022-10-11 NOTE — Congregational Nurse Program (Signed)
  Dept: [phone]   Congregational Nurse Program Note  Date of Encounter: 10/11/2022  Past Medical History: Past Medical History:  Diagnosis Date   Acute kidney injury (HCC) 03/02/2019   Acute respiratory failure with hypoxia (HCC) 02/22/2019   Atrial fibrillation with RVR (HCC) 02/26/2019   COVID-19 02/22/2019   Dysrhythmia    para. atrial fibrillation   Flu-like symptoms 02/22/2019   Hypertension    Knee osteoarthritis 10/24/2017   Pneumonia     Encounter Details:  CNP Questionnaire - 10/11/22 1141       Questionnaire   Ask client: Do you give verbal consent for me to treat you today? Yes    Student Assistance N/A    Location Patient Served  [facility]    Visit Setting with Client Organization    Patient Status Refugee    Insurance Uninsured ([employer]s/Self-Pay/Medicaid Family Planning)    Insurance/Financial Assistance Referral N/A    Medication N/A    Medical Provider Yes    Screening Referrals Made N/A    Medical Referrals Made N/A    Medical Appointment Made N/A    Recently w/o PCP, now 1st time PCP visit completed due to CNs referral or appointment made N/A    Food N/A    Transportation N/A    Housing/Utilities N/A    Interpersonal Safety N/A    Interventions Advocate/Support;Navigate Healthcare System;Counsel;Educate    Abnormal to Normal Screening Since Last CN Visit N/A    Screenings CN Performed Blood Pressure;Weight    Sent Client to Lab for: N/A    Did client attend any of the following based off CNs referral or appointments made? N/A    ED Visit Averted Yes    Life-Saving Intervention Made N/A            Pt came in routine blood pressure monitoring. Has been complaining of palpitations and wearing a monitor. HR 53. Denies any current palpitations. Will continue to monitor.   Nicole Cella Jeda Pardue RN BSN PCCN  [facility] [phone]-cell [phone]-office

## 2022-10-18 NOTE — Congregational Nurse Program (Signed)
  Dept: [phone]   Congregational Nurse Program Note  Date of Encounter: 10/18/2022  Past Medical History: Past Medical History:  Diagnosis Date   Acute kidney injury (HCC) 03/02/2019   Acute respiratory failure with hypoxia (HCC) 02/22/2019   Atrial fibrillation with RVR (HCC) 02/26/2019   COVID-19 02/22/2019   Dysrhythmia    para. atrial fibrillation   Flu-like symptoms 02/22/2019   Hypertension    Knee osteoarthritis 10/24/2017   Pneumonia     Encounter Details:  CNP Questionnaire - 10/18/22 1124       Questionnaire   Ask client: Do you give verbal consent for me to treat you today? Yes    Student Assistance [employer] Nurse    Location Patient Served  [facility]    Visit Setting with Client Organization    Patient Status Refugee    Engineer, building services or [place] Insurance    Insurance/Financial Assistance Referral N/A    Medication Have Medication Insecurities    Medical Provider Yes    Screening Referrals Made N/A    Medical Referrals Made N/A    Medical Appointment Made N/A    Recently w/o PCP, now 1st time PCP visit completed due to CNs referral or appointment made N/A    Food N/A    Transportation N/A    Housing/Utilities N/A    Interpersonal Safety N/A    Interventions Advocate/Support;Navigate Healthcare System;Counsel;Educate    Abnormal to Normal Screening Since Last CN Visit N/A    Screenings CN Performed Blood Pressure;Weight    Sent Client to Lab for: N/A    Did client attend any of the following based off CNs referral or appointments made? N/A    ED Visit Averted Yes    Life-Saving Intervention Made N/A             She came in complaining of headaches and palpitations, and her blood pressure was 140/81 with a HR of 64. Heart rhythm was regular and not skipping any beats. Patient was reassured and educated on heart health & diet and to stay hydrated by drinking enough water. Advised to take OTC medication for headaches.   Nicole Cella Chloeanne Poteet RN BSN PCCN  [facility] [phone]-cell [phone]-office

## 2022-10-18 NOTE — Congregational Nurse Program (Cosign Needed)
Patient with a history of paroxysmal atrial fibrillation and bradycardia presenting with headaches and palpitations. Endorses some occasional runny nose. Denies changes in vision or weakness. Afebrile. Currently wearing 2 week Holter monitor for evaluation of palpitations and will remove on Friday. Encouraged Tylenol for headache and encouraged to drink water.   Physical Exam: HEENT: No palpable lymph nodes Cards: RRR, no murmurs or extra heart sounds Pulm: Lungs clear to auscultation throughout

## 2022-10-19 ENCOUNTER — Ambulatory Visit: Payer: Self-pay | Admitting: Family Medicine

## 2022-10-21 ENCOUNTER — Ambulatory Visit (HOSPITAL_COMMUNITY): Payer: Self-pay | Attending: Physician Assistant

## 2022-10-21 ENCOUNTER — Ambulatory Visit (HOSPITAL_BASED_OUTPATIENT_CLINIC_OR_DEPARTMENT_OTHER): Payer: Self-pay

## 2022-10-21 DIAGNOSIS — Z7901 Long term (current) use of anticoagulants: Secondary | ICD-10-CM | POA: Insufficient documentation

## 2022-10-21 DIAGNOSIS — I10 Essential (primary) hypertension: Secondary | ICD-10-CM | POA: Insufficient documentation

## 2022-10-21 DIAGNOSIS — R079 Chest pain, unspecified: Secondary | ICD-10-CM | POA: Insufficient documentation

## 2022-10-21 DIAGNOSIS — I480 Paroxysmal atrial fibrillation: Secondary | ICD-10-CM | POA: Insufficient documentation

## 2022-10-21 DIAGNOSIS — R002 Palpitations: Secondary | ICD-10-CM | POA: Insufficient documentation

## 2022-10-21 LAB — MYOCARDIAL PERFUSION IMAGING
LV dias vol: 79 mL (ref 46–106)
LV sys vol: 28 mL
Nuc Stress EF: 64 %
Peak HR: 77 {beats}/min
Rest HR: 63 {beats}/min
Rest Nuclear Isotope Dose: 10.5 mCi
SDS: 0
SRS: 0
SSS: 0
ST Depression (mm): 0 mm
Stress Nuclear Isotope Dose: 31.8 mCi
TID: 1.07

## 2022-10-21 LAB — ECHOCARDIOGRAM COMPLETE
Area-P 1/2: 2.91 cm2
S' Lateral: 2.6 cm

## 2022-10-21 MED ORDER — TECHNETIUM TC 99M TETROFOSMIN IV KIT
10.5000 | PACK | Freq: Once | INTRAVENOUS | Status: AC | PRN
  Administered 2022-10-21: 10.5 via INTRAVENOUS

## 2022-10-21 MED ORDER — TECHNETIUM TC 99M TETROFOSMIN IV KIT
31.8000 | PACK | Freq: Once | INTRAVENOUS | Status: AC | PRN
  Administered 2022-10-21: 31.8 via INTRAVENOUS

## 2022-10-21 MED ORDER — REGADENOSON 0.4 MG/5ML IV SOLN
0.4000 mg | Freq: Once | INTRAVENOUS | Status: AC
  Administered 2022-10-21: 0.4 mg via INTRAVENOUS

## 2022-11-01 ENCOUNTER — Telehealth: Payer: Self-pay

## 2022-11-01 DIAGNOSIS — I471 Supraventricular tachycardia, unspecified: Secondary | ICD-10-CM

## 2022-11-01 DIAGNOSIS — I482 Chronic atrial fibrillation, unspecified: Secondary | ICD-10-CM

## 2022-11-01 NOTE — Telephone Encounter (Signed)
-----   Message from [facility] sent at 10/27/2022  5:44 PM EDT ----- Please let Ms. Merla Riches know that her cardiac monitor showed a few short and fast beats that originated from the bottom of her heart. She had many episodes of SVT which are short and fast beats that originated from the top of her heart. She also had three pauses, two of which were during the day, this could cause some dizziness and lightheadedness. With this I would recommend referral to EP for further discussion and evaluation. Thanks!

## 2022-11-01 NOTE — Telephone Encounter (Signed)
The patient has been notified of the result and verbalized understanding.  All questions (if any) were answered. Frutoso Schatz, RN 11/01/2022 9:31 AM  Referral has been placed.

## 2022-11-07 ENCOUNTER — Other Ambulatory Visit: Payer: Self-pay

## 2022-11-08 ENCOUNTER — Encounter: Payer: Self-pay | Admitting: Cardiovascular Disease

## 2022-11-08 ENCOUNTER — Ambulatory Visit: Payer: Self-pay | Attending: Cardiovascular Disease | Admitting: Cardiovascular Disease

## 2022-11-08 VITALS — BP 126/80 | HR 83 | Ht 63.0 in | Wt 214.4 lb

## 2022-11-08 DIAGNOSIS — I10 Essential (primary) hypertension: Secondary | ICD-10-CM

## 2022-11-08 DIAGNOSIS — R001 Bradycardia, unspecified: Secondary | ICD-10-CM

## 2022-11-08 DIAGNOSIS — I480 Paroxysmal atrial fibrillation: Secondary | ICD-10-CM

## 2022-11-08 DIAGNOSIS — I5032 Chronic diastolic (congestive) heart failure: Secondary | ICD-10-CM

## 2022-11-08 NOTE — Patient Instructions (Signed)

## 2022-11-08 NOTE — Progress Notes (Signed)
Chief Complaint  Patient presents with   Follow-up    PAF, dizziness    History of Present Illness: 72 yo female with history of paroxysmal atrial fibrillation, HTN, CKD, non-sustained VT, SVT and mitral regurgitation who is here today for follow up. She was seen in the hospital in January 2021 by Dr. Mayford Knife for new onset atrial fibrillation with RVR. She was admitted with Covid 19 related pneumonia. She was rate controlled with Cardizem and was started on Eliquis. Echo January 2021 with LVEF=60-65%. Mild MR. She was seen in our office in August 2024 with c/o dyspnea on exertion, palpitations, dizziness. Echo September 2024 with LVEF=55-60%. Normal RV function. Bi-atrial enlargement. Mild MR. Nuclear stress test September 2024 with no ischemia. Cardiac monitor with sinus with sinus bradycardia, rate as low as 36 bpm during the night (presumably while sleeping). 4 runs of VT. 41 runs of SVT, longest 17 seconds. 3 pauses, longest 3.8 seconds in the morning while awake. She has been referred to see EP and will see Dr. Jimmey Ralph on 12/13/22.   She is here today for follow up. She tells me that she has dizziness several days per week. The dizziness occurs when she stands up. No syncope. The patient denies any chest pain, dyspnea, palpitations, lower extremity edema, orthopnea, PND.    Primary Care Physician: Hoy Register, MD   Past Medical History:  Diagnosis Date   Acute kidney injury (HCC) 03/02/2019   Acute respiratory failure with hypoxia (HCC) 02/22/2019   Atrial fibrillation with RVR (HCC) 02/26/2019   COVID-19 02/22/2019   Dysrhythmia    para. 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 90 tablet 1   apixaban (ELIQUIS) 5 MG TABS tablet Take 1 tablet (5 mg total) by mouth 2 (two) times daily. 180 tablet 1   colchicine 0.6 MG tablet Take 2 tablets by mouth at the onset of a gout flare, repeat 1 tablet in 1 hour if flare continues. 90 tablet 0   gabapentin (NEURONTIN) 100 MG capsule Take 100 mg by mouth 2 (two) times daily.     rosuvastatin (CRESTOR) 10 MG tablet Take 1 tablet (10 mg total) by mouth daily. To lower cholesterol 90 tablet 1   No current facility-administered medications for this visit.    No Known Allergies  Social History   Socioeconomic History   Marital status: Married    Spouse name: Not on file   Number of children: Not on file   Years of education: Not on file   Highest education level: Not on file  Occupational History   Not on file  Tobacco Use   Smoking status: Never   Smokeless tobacco: Never  Vaping Use   Vaping status: Never Used  Substance and Sexual Activity   Alcohol use: Never   Drug use: Never  Sexual activity: Not Currently    Birth control/protection: Post-menopausal  Other Topics Concern   Not on file  Social History Narrative   Not on file   Social Determinants of Health   Financial Resource Strain: Not on file  Food Insecurity: Not on file  Transportation Needs: No Transportation Needs (10/15/2019)   PRAPARE - Administrator, Civil Service (Medical): No    Lack of Transportation (Non-Medical): No  Physical Activity: Not on file  Stress: Not on file  Social Connections: Not on file  Intimate Partner Violence: Not on file    Family History  Problem Relation  Age of Onset   Breast cancer Neg Hx     Review of Systems:  As stated in the HPI and otherwise negative.   BP 126/80   Pulse 83   Ht 5\' 3"  (1.6 m)   Wt 97.3 kg   LMP  (LMP Unknown)   SpO2 96%   BMI 37.98 kg/m   Physical Examination: General: Well developed, well nourished, NAD  HEENT: OP clear, mucus membranes moist  SKIN: warm, dry. No rashes. Neuro: No focal deficits  Musculoskeletal: Muscle strength 5/5 all ext  Psychiatric: Mood and affect normal  Neck: No JVD, no carotid bruits, no thyromegaly, no lymphadenopathy.  Lungs:Clear bilaterally, no wheezes, rhonci, crackles Cardiovascular: Regular rate and rhythm. No murmurs, gallops or rubs. Abdomen:Soft. Bowel sounds present. Non-tender.  Extremities: No lower extremity edema. Pulses are 2 + in the bilateral DP/PT.  EKG:  EKG is not ordered today.  Recent Labs: 09/13/2022: ALT 13; BNP CANCELED; BUN 12; Creatinine, Ser 1.13; Hemoglobin 13.0; Platelets 76; Potassium 4.6; Sodium 142; TSH 0.909 09/27/2022: Magnesium 2.2; NT-Pro BNP 283   Lipid Panel    Component Value Date/Time   CHOL 139 06/01/2021 0953   TRIG 58 06/01/2021 0953   HDL 58 06/01/2021 0953   CHOLHDL 4.7 (H) 08/08/2018 1145   LDLCALC 69 06/01/2021 0953     Wt Readings from Last 3 Encounters:  11/08/22 97.3 kg  10/21/22 98.4 kg  10/18/22 98 kg     Assessment and Plan:   1. Paroxysmal atrial fibrillation: She is in sinus today on exam CHADS VASC score 3. She is not on AV nodal blocking agents. Continue Eliquis.   2. Chronic diastolic CHF: Normal LV function by echo in September 2024. Weight is stable. No volume overload on exam.    3. Sinus bradycardia with sinus pauses/Dizziness: No syncope.She has an appt with EP in several weeks to evaluate need for a permanent pacemaker. She is stable today.   4. HTN: BP is well controlled. Continue Norvasc.   Labs/ tests ordered today include:  No orders of the defined types were placed in this  encounter.  Disposition:   FU with me in 12 months  Signed, Verne Carrow, MD 11/08/2022 3:50 PM    [facility] [name] [place], [place], [place]  [address] Phone: [phone]; Fax: [phone]

## 2022-11-23 ENCOUNTER — Ambulatory Visit: Payer: Self-pay

## 2022-11-23 DIAGNOSIS — Z23 Encounter for immunization: Secondary | ICD-10-CM

## 2022-11-28 ENCOUNTER — Other Ambulatory Visit (HOSPITAL_COMMUNITY): Payer: Self-pay

## 2022-11-28 ENCOUNTER — Other Ambulatory Visit: Payer: Self-pay

## 2022-11-28 ENCOUNTER — Telehealth: Payer: Self-pay | Admitting: Family Medicine

## 2022-11-28 ENCOUNTER — Other Ambulatory Visit: Payer: Self-pay | Admitting: Physician Assistant

## 2022-11-28 DIAGNOSIS — M10072 Idiopathic gout, left ankle and foot: Secondary | ICD-10-CM

## 2022-11-28 MED ORDER — COLCHICINE 0.6 MG PO TABS
ORAL_TABLET | ORAL | 0 refills | Status: DC
  Filled 2022-11-28: qty 90, 30d supply, fill #0

## 2022-11-28 NOTE — Telephone Encounter (Signed)
Already requested today by pharmacy in a separate refill encounter.

## 2022-11-28 NOTE — Telephone Encounter (Signed)
Medication Refill - Medication: colchicine 0.6 MG tablet  Has the patient contacted their pharmacy? Yes.     (Preferred Pharmacy (with phone number or street name):   [facility]  [address], [address], [place] [address]  Phone:  [phone]  Fax:  [phone]  DEA #:  [id number]       Has the patient been seen for an appointment in the last year OR does the patient have an upcoming appointment? Yes.    Agent: Please be advised that RX refills may take up to 3 business days. We ask that you follow-up with your pharmacy.

## 2022-12-06 NOTE — Congregational Nurse Program (Signed)
  Dept: [phone]   Congregational Nurse Program Note  Date of Encounter: 12/06/2022  Past Medical History: Past Medical History:  Diagnosis Date   Acute kidney injury (HCC) 03/02/2019   Acute respiratory failure with hypoxia (HCC) 02/22/2019   Atrial fibrillation with RVR (HCC) 02/26/2019   COVID-19 02/22/2019   Dysrhythmia    para. atrial fibrillation   Flu-like symptoms 02/22/2019   Hypertension    Knee osteoarthritis 10/24/2017   Pneumonia     Encounter Details:  Community Questionnaire - 12/06/22 1053       Questionnaire   Ask client: Do you give verbal consent for me to treat you today? Yes    Student Assistance [employer] Nurse    Location Patient Served  [facility]    Encounter Setting CN site    Engineer, building services or [place] Insurance    Insurance/Financial Assistance Referral N/A    Medication Have Medication Insecurities    Medical Provider Yes    Screening Referrals Made N/A    Medical Referrals Made N/A    Medical Appointment Completed N/A    CNP Interventions Advocate/Support;Navigate Healthcare System;Counsel;Educate;Case Management    Screenings CN Performed Blood Pressure    ED Visit Averted Yes    Life-Saving Intervention Made N/A           Patient came in after reporting a fall when exiting the bus this morning. She had minor bleeding on the right thumb and was cleaned with saline wipes and bandaged. Patient's BP was 139/77 and HR 70. Patient was educated on ways to prevent falls/injury.  Nicole Cella Deonte Otting RN BSN PCCN  [facility] [phone]-office

## 2022-12-07 NOTE — Congregational Nurse Program (Signed)
  Dept: [phone]   Congregational Nurse Program Note  Date of Encounter: 12/07/2022  Past Medical History: Past Medical History:  Diagnosis Date   Acute kidney injury (HCC) 03/02/2019   Acute respiratory failure with hypoxia (HCC) 02/22/2019   Atrial fibrillation with RVR (HCC) 02/26/2019   COVID-19 02/22/2019   Dysrhythmia    para. atrial fibrillation   Flu-like symptoms 02/22/2019   Hypertension    Knee osteoarthritis 10/24/2017   Pneumonia     Encounter Details:  Community Questionnaire - 12/07/22 1138       Questionnaire   Ask client: Do you give verbal consent for me to treat you today? Yes    Student Assistance N/A    Location Patient Served  [facility]    Encounter Setting CN site    Population Status Migrant/Refugee    Engineer, building services or [place] Insurance    Insurance/Financial Assistance Referral N/A    Medication Have Medication Insecurities    Medical Provider Yes    Screening Referrals Made N/A    Medical Referrals Made N/A    Medical Appointment Completed [facility] PCP[facility]Clinic;[facility] PCP/Clinic;Vaccination    CNP Interventions Advocate/Support;Navigate Healthcare System;Counsel;Educate;Case Management    Screenings CN Performed Blood Pressure    ED Visit Averted Yes    Life-Saving Intervention Made N/A            Patient requesting details of upcoming appointments. Dates, times and addresses provided.  Nicole Cella Irie Fiorello RN BSN PCCN  [facility] [phone]-cell [phone]-office

## 2022-12-13 ENCOUNTER — Encounter: Payer: Self-pay | Admitting: Cardiology

## 2022-12-13 ENCOUNTER — Ambulatory Visit: Payer: Self-pay | Attending: Cardiology | Admitting: Cardiology

## 2022-12-13 VITALS — BP 146/86 | HR 68 | Ht 63.0 in | Wt 210.0 lb

## 2022-12-13 DIAGNOSIS — I480 Paroxysmal atrial fibrillation: Secondary | ICD-10-CM

## 2022-12-13 DIAGNOSIS — I495 Sick sinus syndrome: Secondary | ICD-10-CM

## 2022-12-13 DIAGNOSIS — I10 Essential (primary) hypertension: Secondary | ICD-10-CM

## 2022-12-13 DIAGNOSIS — D6869 Other thrombophilia: Secondary | ICD-10-CM

## 2022-12-13 NOTE — Patient Instructions (Signed)
Medication Instructions:  Your physician recommends that you continue on your current medications as directed. Please refer to the Current Medication list given to you today.  *If you need a refill on your cardiac medications before your next appointment, please call your pharmacy*   Lab Work: BMET and CBC - anytime between 1/10 and 2/7   Testing/Procedures: Your physician has recommended that you have a pacemaker inserted. A pacemaker is a small device that is placed under the skin of your chest or abdomen to help control abnormal heart rhythms. This device uses electrical pulses to prompt the heart to beat at a normal rate. Pacemakers are used to treat heart rhythms that are too slow. Wire (leads) are attached to the pacemaker that goes into the chambers of you heart. This is done in the hospital and usually requires and overnight stay.  You are scheduled for Pacemaker Implant on Monday, February 10 with Dr. Michele Rockers.Please arrive at the Main Entrance A at [facility]: [address] [place], [place] [address] at 5:30 AM     Follow-Up: At [facility], you and your health needs are our priority.  As part of our continuing mission to provide you with exceptional heart care, we have created designated Provider Care Teams.  These Care Teams include your primary Cardiologist (physician) and Advanced Practice Providers (APPs -  Physician Assistants and Nurse Practitioners) who all work together to provide you with the care you need, when you need it.  Your next appointment:   We will call you to schedule your follow up appointments.

## 2022-12-13 NOTE — Progress Notes (Signed)
Electrophysiology Office Note:   Date:  12/13/2022  ID:  [name] [name], DOB [birth year], MRN [id number]  Primary Cardiologist: Verne Carrow, MD Electrophysiologist: Nobie Putnam, MD      History of Present Illness:   [name] [name] is a 72 y.o. female with h/o paroxysmal atrial fibrillation, HTN, CKD, non-sustained VT, SVT and mitral regurgitation who is being seen today for abnormal Zio.   Patient reports intermittent episodes of palpitations. She also endorses frequent episodes of dizziness and lightheadedness occurring several days per week. She had multiple pauses on her Zio that were associated with symptoms. These occurred at ~9am and ~1pm. She reports being awake at these times every day. She is otherwise relatively well. No new or acute complaints.  Review of systems complete and found to be negative unless listed in HPI.   Patient interview conducted via use of a Runner, broadcasting/film/video.  EP Information / Studies Reviewed:    EKG is ordered today. Personal review as below.  EKG Interpretation Date/Time:  Tuesday December 13 2022 15:05:30 EST Ventricular Rate:  68 PR Interval:  120 QRS Duration:  102 QT Interval:  424 QTC Calculation: 450 R Axis:   91  Text Interpretation: Normal sinus rhythm Rightward axis Septal infarct (cited on or before 08-Sep-2022) When compared with ECG of 27-Sep-2022 14:47,rate is faster. Confirmed by Nobie Putnam [id number]) on 12/13/2022 7:13:56 PM   EKG 02/27/19:   Luci Bank 10/2022:    Risk Assessment/Calculations:    CHA2DS2-VASc Score = 4   This indicates a 4.8% annual risk of stroke. The patient's score is based upon: CHF History: 0 HTN History: 1 Diabetes History: 1 Stroke History: 0 Vascular Disease History: 0 Age Score: 1 Gender Score: 1         Physical Exam:   VS:  BP (!) 146/86   Pulse 68   Ht 5\' 3"  (1.6 m)   Wt 210 lb (95.3 kg)   LMP  (LMP Unknown)   SpO2 94%   BMI 37.20 kg/m    Wt Readings  from Last 3 Encounters:  12/13/22 210 lb (95.3 kg)  11/08/22 214 lb 6.4 oz (97.3 kg)  10/21/22 217 lb (98.4 kg)     GEN: Well nourished, well developed in no acute distress NECK: No JVD; No carotid bruits CARDIAC: Normal rate and regular rhythm. RESPIRATORY:  Clear to auscultation without rales, wheezing or rhonchi  ABDOMEN: Soft, non-tender, non-distended EXTREMITIES:  No edema; No deformity   ASSESSMENT AND PLAN:    Patient has sinus node dysfunction with sinus pauses that are symptomatic. These do not appear to be consistent with a vagally mediated etiology. She also has a history of atrial fibrillation and palpitations. Nonsustained SVT and VT seen on Zio. Unable to treat AF, SVT and VT due to underlying sinus dysfunction.   #Sinus node dysfunction #Paroxysmal atrial fibrillation #Tachycardia-bradycardia syndrome - Offered dual chamber pacemaker implant for true tachybrady syndrome. Following PPM implant, would start metoprolol for treatment of tachyarrhythmia.  Explained risks, benefits, and alternatives to pacemaker implantation, including but not limited to bleeding, infection, damage to heart or lungs, heart attack, stroke, or death.  Pt verbalized understanding and agrees to proceed if indicated.   #Secondary hypercoagulable state due to atrial fibrillation:  - CHADSVASC score of 4.  - Continue Eliquis.   #Hypertension - Above goal today.  Recommend checking blood pressures 1-2 times per week at home and recording the values.  Recommend bringing these recordings to the primary care physician.  Signed, Nobie Putnam, MD

## 2022-12-14 ENCOUNTER — Other Ambulatory Visit: Payer: Self-pay

## 2022-12-14 DIAGNOSIS — I495 Sick sinus syndrome: Secondary | ICD-10-CM

## 2022-12-15 ENCOUNTER — Telehealth (HOSPITAL_COMMUNITY): Payer: Self-pay | Admitting: Licensed Clinical Social Worker

## 2022-12-15 NOTE — Telephone Encounter (Signed)
CSW consulted to speak with pt regarding lack of insurance and concerns with how patient will get necessary pacemake procedure without it.  CSW called pt with [name] interpreter to discuss but was unable to reach- left VM requesting return call  CSW reviewed chart and saw long time visits with [facility]- reached out to RN who sees patient but they had no additional information regarding the patients insurance or citizenship status  Emergency Contact listed is for Early Katrinka Blazing who is a case worker with [facility] [employer]- CSW attempted to call to get further info but unable to reach- left VM  Burna Sis, LCSW Clinical Social Worker [facility] Desk#: [phone] Cell#: [phone]

## 2022-12-28 ENCOUNTER — Other Ambulatory Visit: Payer: Self-pay

## 2022-12-28 ENCOUNTER — Encounter: Payer: Self-pay | Admitting: Family Medicine

## 2022-12-28 ENCOUNTER — Ambulatory Visit: Payer: Self-pay | Attending: Family Medicine | Admitting: Family Medicine

## 2022-12-28 VITALS — BP 128/79 | HR 82 | Ht 63.0 in | Wt 206.8 lb

## 2022-12-28 DIAGNOSIS — Z6836 Body mass index (BMI) 36.0-36.9, adult: Secondary | ICD-10-CM

## 2022-12-28 DIAGNOSIS — M10072 Idiopathic gout, left ankle and foot: Secondary | ICD-10-CM

## 2022-12-28 DIAGNOSIS — I495 Sick sinus syndrome: Secondary | ICD-10-CM

## 2022-12-28 DIAGNOSIS — I482 Chronic atrial fibrillation, unspecified: Secondary | ICD-10-CM

## 2022-12-28 DIAGNOSIS — E1169 Type 2 diabetes mellitus with other specified complication: Secondary | ICD-10-CM

## 2022-12-28 DIAGNOSIS — R112 Nausea with vomiting, unspecified: Secondary | ICD-10-CM

## 2022-12-28 DIAGNOSIS — E7800 Pure hypercholesterolemia, unspecified: Secondary | ICD-10-CM

## 2022-12-28 DIAGNOSIS — I10 Essential (primary) hypertension: Secondary | ICD-10-CM

## 2022-12-28 DIAGNOSIS — E66812 Obesity, class 2: Secondary | ICD-10-CM

## 2022-12-28 LAB — POCT GLYCOSYLATED HEMOGLOBIN (HGB A1C): HbA1c, POC (controlled diabetic range): 5.6 % (ref 0.0–7.0)

## 2022-12-28 MED ORDER — ONDANSETRON HCL 4 MG PO TABS
4.0000 mg | ORAL_TABLET | Freq: Three times a day (TID) | ORAL | 0 refills | Status: DC | PRN
  Filled 2022-12-28: qty 20, 7d supply, fill #0

## 2022-12-28 MED ORDER — ROSUVASTATIN CALCIUM 10 MG PO TABS
10.0000 mg | ORAL_TABLET | Freq: Every day | ORAL | 1 refills | Status: DC
Start: 2022-12-28 — End: 2023-09-06
  Filled 2022-12-28 – 2023-02-03 (×2): qty 90, 90d supply, fill #0
  Filled 2023-06-02 (×2): qty 90, 90d supply, fill #1

## 2022-12-28 MED ORDER — AMLODIPINE BESYLATE 5 MG PO TABS
5.0000 mg | ORAL_TABLET | Freq: Every day | ORAL | 1 refills | Status: DC
Start: 2022-12-28 — End: 2023-02-23
  Filled 2022-12-28 – 2023-02-03 (×2): qty 90, 90d supply, fill #0

## 2022-12-28 MED ORDER — COLCHICINE 0.6 MG PO TABS
ORAL_TABLET | ORAL | 1 refills | Status: DC
  Filled 2022-12-28: qty 90, 30d supply, fill #0
  Filled 2023-02-03: qty 90, 30d supply, fill #1

## 2022-12-28 NOTE — Patient Instructions (Signed)
Calorie Counting for Weight Loss Calories are units of energy. Your body needs a certain number of calories from food to keep going throughout the day. When you eat or drink more calories than your body needs, your body stores the extra calories mostly as fat. When you eat or drink fewer calories than your body needs, your body burns fat to get the energy it needs. Calorie counting means keeping track of how many calories you eat and drink each day. Calorie counting can be helpful if you need to lose weight. If you eat fewer calories than your body needs, you should lose weight. Ask your health care provider what a healthy weight is for you. For calorie counting to work, you will need to eat the right number of calories each day to lose a healthy amount of weight per week. A dietitian can help you figure out how many calories you need in a day and will suggest ways to reach your calorie goal. A healthy amount of weight to lose each week is usually 1-2 lb (0.5-0.9 kg). This usually means that your daily calorie intake should be reduced by 500-750 calories. Eating 1,200-1,500 calories a day can help most women lose weight. Eating 1,500-1,800 calories a day can help most men lose weight. What do I need to know about calorie counting? Work with your health care provider or dietitian to determine how many calories you should get each day. To meet your daily calorie goal, you will need to: Find out how many calories are in each food that you would like to eat. Try to do this before you eat. Decide how much of the food you plan to eat. Keep a food log. Do this by writing down what you ate and how many calories it had. To successfully lose weight, it is important to balance calorie counting with a healthy lifestyle that includes regular activity. Where do I find calorie information?  The number of calories in a food can be found on a Nutrition Facts label. If a food does not have a Nutrition Facts label, try  to look up the calories online or ask your dietitian for help. Remember that calories are listed per serving. If you choose to have more than one serving of a food, you will have to multiply the calories per serving by the number of servings you plan to eat. For example, the label on a package of bread might say that a serving size is 1 slice and that there are 90 calories in a serving. If you eat 1 slice, you will have eaten 90 calories. If you eat 2 slices, you will have eaten 180 calories. How do I keep a food log? After each time that you eat, record the following in your food log as soon as possible: What you ate. Be sure to include toppings, sauces, and other extras on the food. How much you ate. This can be measured in cups, ounces, or number of items. How many calories were in each food and drink. The total number of calories in the food you ate. Keep your food log near you, such as in a pocket-sized notebook or on an app or website on your mobile phone. Some programs will calculate calories for you and show you how many calories you have left to meet your daily goal. What are some portion-control tips? Know how many calories are in a serving. This will help you know how many servings you can have of a certain  food. Use a measuring cup to measure serving sizes. You could also try weighing out portions on a kitchen scale. With time, you will be able to estimate serving sizes for some foods. Take time to put servings of different foods on your favorite plates or in your favorite bowls and cups so you know what a serving looks like. Try not to eat straight from a food's packaging, such as from a bag or box. Eating straight from the package makes it hard to see how much you are eating and can lead to overeating. Put the amount you would like to eat in a cup or on a plate to make sure you are eating the right portion. Use smaller plates, glasses, and bowls for smaller portions and to prevent  overeating. Try not to multitask. For example, avoid watching TV or using your computer while eating. If it is time to eat, sit down at a table and enjoy your food. This will help you recognize when you are full. It will also help you be more mindful of what and how much you are eating. What are tips for following this plan? Reading food labels Check the calorie count compared with the serving size. The serving size may be smaller than what you are used to eating. Check the source of the calories. Try to choose foods that are high in protein, fiber, and vitamins, and low in saturated fat, trans fat, and sodium. Shopping Read nutrition labels while you shop. This will help you make healthy decisions about which foods to buy. Pay attention to nutrition labels for low-fat or fat-free foods. These foods sometimes have the same number of calories or more calories than the full-fat versions. They also often have added sugar, starch, or salt to make up for flavor that was removed with the fat. Make a grocery list of lower-calorie foods and stick to it. Cooking Try to cook your favorite foods in a healthier way. For example, try baking instead of frying. Use low-fat dairy products. Meal planning Use more fruits and vegetables. One-half of your plate should be fruits and vegetables. Include lean proteins, such as chicken, [place], and fish. Lifestyle Each week, aim to do one of the following: 150 minutes of moderate exercise, such as walking. 75 minutes of vigorous exercise, such as running. General information Know how many calories are in the foods you eat most often. This will help you calculate calorie counts faster. Find a way of tracking calories that works for you. Get creative. Try different apps or programs if writing down calories does not work for you. What foods should I eat?  Eat nutritious foods. It is better to have a nutritious, high-calorie food, such as an avocado, than a food with  few nutrients, such as a bag of potato chips. Use your calories on foods and drinks that will fill you up and will not leave you hungry soon after eating. Examples of foods that fill you up are nuts and nut butters, vegetables, lean proteins, and high-fiber foods such as whole grains. High-fiber foods are foods with more than 5 g of fiber per serving. Pay attention to calories in drinks. Low-calorie drinks include water and unsweetened drinks. The items listed above may not be a complete list of foods and beverages you can eat. Contact a dietitian for more information. What foods should I limit? Limit foods or drinks that are not good sources of vitamins, minerals, or protein or that are high in unhealthy fats. These  include: Candy. Other sweets. Sodas, specialty coffee drinks, alcohol, and juice. The items listed above may not be a complete list of foods and beverages you should avoid. Contact a dietitian for more information. How do I count calories when eating out? Pay attention to portions. Often, portions are much larger when eating out. Try these tips to keep portions smaller: Consider sharing a meal instead of getting your own. If you get your own meal, eat only half of it. Before you start eating, ask for a container and put half of your meal into it. When available, consider ordering smaller portions from the menu instead of full portions. Pay attention to your food and drink choices. Knowing the way food is cooked and what is included with the meal can help you eat fewer calories. If calories are listed on the menu, choose the lower-calorie options. Choose dishes that include vegetables, fruits, whole grains, low-fat dairy products, and lean proteins. Choose items that are boiled, broiled, grilled, or steamed. Avoid items that are buttered, battered, fried, or served with cream sauce. Items labeled as crispy are usually fried, unless stated otherwise. Choose water, low-fat milk,  unsweetened iced tea, or other drinks without added sugar. If you want an alcoholic beverage, choose a lower-calorie option, such as a glass of wine or light beer. Ask for dressings, sauces, and syrups on the side. These are usually high in calories, so you should limit the amount you eat. If you want a salad, choose a garden salad and ask for grilled meats. Avoid extra toppings such as bacon, cheese, or fried items. Ask for the dressing on the side, or ask for olive oil and vinegar or lemon to use as dressing. Estimate how many servings of a food you are given. Knowing serving sizes will help you be aware of how much food you are eating at restaurants. Where to find more information Centers for Disease Control and Prevention: FootballExhibition.com.br U.S. Department of Agriculture: WrestlingReporter.dk Summary Calorie counting means keeping track of how many calories you eat and drink each day. If you eat fewer calories than your body needs, you should lose weight. A healthy amount of weight to lose per week is usually 1-2 lb (0.5-0.9 kg). This usually means reducing your daily calorie intake by 500-750 calories. The number of calories in a food can be found on a Nutrition Facts label. If a food does not have a Nutrition Facts label, try to look up the calories online or ask your dietitian for help. Use smaller plates, glasses, and bowls for smaller portions and to prevent overeating. Use your calories on foods and drinks that will fill you up and not leave you hungry shortly after a meal. This information is not intended to replace advice given to you by your health care provider. Make sure you discuss any questions you have with your health care provider. Document Revised: 02/28/2019 Document Reviewed: 02/28/2019 Elsevier Patient Education  2023 [employer].

## 2022-12-28 NOTE — Progress Notes (Signed)
Subjective:  Patient ID: [name] [name], female    DOB: [birth year]  Age: 72 y.o. MRN: [id number]  CC: Medical Management of Chronic Issues (Nausea and vomiting)   HPI Delmar Jaylianie Rosiak is a 72 y.o. year old female with a history of Type 2 DM (diet controlled with A1c of 5.6), Hypertension,  A.fib with RVR, s/p b/l TKA, Gout. Seen with the aid of an in person interpreter who happens to be a friend of hers.  Interval History: Discussed the use of AI scribe software for clinical note transcription with the patient, who gave verbal consent to proceed.  She presents with a week-long history of nausea and vomiting. The onset of symptoms was sudden, with no identifiable trigger. The patient denies any specific food or dietary changes that could have precipitated the symptoms. The nausea and vomiting occur at different times of the day, with the first episode occurring at night. The patient also experienced nausea during the day but did not vomit. The patient denies any abdominal pain associated with the nausea and vomiting.  The patient also reports a history of dizziness and lightheadedness, for which she saw a cardiologist two weeks ago. The patient is scheduled for a pacemaker insertion for sinus node dysfunction in February.  She remains on Eliquis due to her history of A-fib.  Her diabetes is controlled without medication with an A1c of 5.6.  She remains adherent with her statin. Gout has been stable with no flares. She is doing well on amlodipine for her hypertension.       Past Medical History:  Diagnosis Date   Acute kidney injury (HCC) 03/02/2019   Acute respiratory failure with hypoxia (HCC) 02/22/2019   Atrial fibrillation with RVR (HCC) 02/26/2019   COVID-19 02/22/2019   Dysrhythmia    para. atrial fibrillation   Flu-like symptoms 02/22/2019   Hypertension    Knee osteoarthritis 10/24/2017   Pneumonia     Past Surgical History:  Procedure Laterality Date   NO  PAST SURGERIES     TOTAL KNEE ARTHROPLASTY Right 12/03/2019   Procedure: RIGHT TOTAL KNEE ARTHROPLASTY;  Surgeon: Cammy Copa, MD;  Location: [facility] OR;  Service: Orthopedics;  Laterality: Right;   TOTAL KNEE ARTHROPLASTY Left 07/07/2020   Procedure: LEFT TOTAL KNEE ARTHROPLASTY-CEMENTED;  Surgeon: Cammy Copa, MD;  Location[facility] OR;  Service: Orthopedics;  Laterality: Left;    Family History  Problem Relation Age of Onset   Breast cancer Neg Hx     Social History   Socioeconomic History   Marital status: Married    Spouse name: Not on file   Number of children: Not on file   Years of education: Not on file   Highest education level: Not on file  Occupational History   Not on file  Tobacco Use   Smoking status: Never   Smokeless tobacco: Never  Vaping Use   Vaping status: Never Used  Substance and Sexual Activity   Alcohol use: Never   Drug use: Never   Sexual activity: Not Currently    Birth control/protection: Post-menopausal  Other Topics Concern   Not on file  Social History Narrative   Not on file   Social Determinants of Health   Financial Resource Strain: Not on file  Food Insecurity: Not on file  Transportation Needs: No Transportation Needs (10/15/2019)   PRAPARE - Administrator, Civil Service (Medical): No    Lack of Transportation (Non-Medical): No  Physical Activity: Not on  file  Stress: Not on file  Social Connections: Not on file    No Known Allergies  Outpatient Medications Prior to Visit  Medication Sig Dispense Refill   apixaban (ELIQUIS) 5 MG TABS tablet Take 1 tablet (5 mg total) by mouth 2 (two) times daily. 180 tablet 1   amLODipine (NORVASC) 5 MG tablet Take 1 tablet (5 mg total) by mouth daily. 90 tablet 1   colchicine 0.6 MG tablet Take 2 tablets by mouth at the onset of a gout flare, repeat 1 tablet in 1 hour if flare continues. 90 tablet 0   rosuvastatin (CRESTOR) 10 MG tablet Take 1 tablet (10 mg total) by mouth  daily. To lower cholesterol 90 tablet 1   gabapentin (NEURONTIN) 100 MG capsule Take 100 mg by mouth 2 (two) times daily. (Patient not taking: Reported on 12/28/2022)     No facility-administered medications prior to visit.     ROS Review of Systems  Constitutional:  Negative for activity change and appetite change.  HENT:  Negative for sinus pressure and sore throat.   Respiratory:  Negative for chest tightness, shortness of breath and wheezing.   Cardiovascular:  Negative for chest pain and palpitations.  Gastrointestinal:  Positive for nausea and vomiting. Negative for abdominal distention, abdominal pain and constipation.  Genitourinary: Negative.   Musculoskeletal: Negative.   Psychiatric/Behavioral:  Negative for behavioral problems and dysphoric mood.     Objective:  BP 128/79   Pulse 82   Ht 5\' 3"  (1.6 m)   Wt 206 lb 12.8 oz (93.8 kg)   LMP  (LMP Unknown)   SpO2 94%   BMI 36.63 kg/m      12/28/2022    3:19 PM 12/13/2022    3:00 PM 12/06/2022   10:52 AM  BP/Weight  Systolic BP 128 146 139  Diastolic BP 79 86 77  Wt. (Lbs) 206.8 210   BMI 36.63 kg/m2 37.2 kg/m2       Physical Exam Constitutional:      Appearance: She is well-developed.  Cardiovascular:     Rate and Rhythm: Normal rate.     Heart sounds: Normal heart sounds. No murmur heard. Pulmonary:     Effort: Pulmonary effort is normal.     Breath sounds: Normal breath sounds. No wheezing or rales.  Chest:     Chest wall: No tenderness.  Abdominal:     General: Bowel sounds are normal. There is no distension.     Palpations: Abdomen is soft. There is no mass.     Tenderness: There is no abdominal tenderness.  Musculoskeletal:        General: Normal range of motion.     Right lower leg: No edema.     Left lower leg: No edema.  Neurological:     Mental Status: She is alert and oriented to person, place, and time.  Psychiatric:        Mood and Affect: Mood normal.        Latest Ref Rng &  Units 09/13/2022    4:41 PM 03/17/2022    4:38 PM 06/01/2021    9:53 AM  CMP  Glucose 70 - 99 mg/dL 96  75  88   BUN 8 - 27 mg/dL 12  7  14    Creatinine 0.57 - 1.00 mg/dL 6.21  3.08  6.57   Sodium 134 - 144 mmol/L 142  143  144   Potassium 3.5 - 5.2 mmol/L 4.6  3.2  4.0  Chloride 96 - 106 mmol/L 106  100  107   CO2 20 - 29 mmol/L 23  26  23    Calcium 8.7 - 10.3 mg/dL 9.2  9.0  8.7   Total Protein 6.0 - 8.5 g/dL 7.8  7.5  7.4   Total Bilirubin 0.0 - 1.2 mg/dL 0.3  0.3  0.3   Alkaline Phos 44 - 121 IU/L 95  70  83   AST 0 - 40 IU/L 21  20  15    ALT 0 - 32 IU/L 13  17  10      Lipid Panel     Component Value Date/Time   CHOL 139 06/01/2021 0953   TRIG 58 06/01/2021 0953   HDL 58 06/01/2021 0953   CHOLHDL 4.7 (H) 08/08/2018 1145   LDLCALC 69 06/01/2021 0953    CBC    Component Value Date/Time   WBC 5.9 09/13/2022 1641   WBC 13.9 (H) 07/09/2020 0124   RBC 5.38 (H) 09/13/2022 1641   RBC 4.61 07/09/2020 0124   HGB 13.0 09/13/2022 1641   HCT 42.6 09/13/2022 1641   PLT 76 (LL) 09/13/2022 1641   MCV 79 09/13/2022 1641   MCH 24.2 (L) 09/13/2022 1641   MCH 23.2 (L) 07/09/2020 0124   MCHC 30.5 (L) 09/13/2022 1641   MCHC 32.7 07/09/2020 0124   RDW 19.0 (H) 09/13/2022 1641   LYMPHSABS 2.4 09/13/2022 1641   MONOABS 0.3 03/01/2019 0742   EOSABS 0.2 09/13/2022 1641   BASOSABS 0.0 09/13/2022 1641    Lab Results  Component Value Date   HGBA1C 5.6 12/28/2022    Assessment & Plan:   Nausea and Vomiting Acute onset this week, occurring at various times of the day. No clear correlation with food intake. No associated abdominal pain. Possible viral etiology. -Plan to prescribe anti-nausea medication for use as needed. -If symptoms persist, consider referral to a gastroenterologist.  Morbid Obesity Patient expressed concern about weight. Discussed potential use of weekly injectable medication for weight loss, but she would like to hold off on this today.  Advised of adverse effects  of GLP-1 RA including nausea which she is already experiencing. -Schedule follow-up in three months to discuss weight management strategies.  Atrial fibrillation Patient has a history of atrial fibrillation and is scheduled for pacemaker insertion due to sinus node dysfunction in February. No current symptoms of dizziness or lightheadedness reported. -Continue current cardiac management plan.    Hyperlipidemia Patient due for cholesterol check, but labs will be at next visit as her friend who provides transportation to her appointment will be traveling out of the country next week.   Gout Currently controlled, no acute issues. -Continue current management plan.     Type 2 diabetes mellitus Controlled with A1c of 5.6 Diet controlled   Hypertension Controlled Continue current regimen     Meds ordered this encounter  Medications   amLODipine (NORVASC) 5 MG tablet    Sig: Take 1 tablet (5 mg total) by mouth daily.    Dispense:  90 tablet    Refill:  1   colchicine 0.6 MG tablet    Sig: Take 2 tablets by mouth at the onset of a gout flare, repeat 1 tablet in 1 hour if flare continues.    Dispense:  90 tablet    Refill:  1   rosuvastatin (CRESTOR) 10 MG tablet    Sig: Take 1 tablet (10 mg total) by mouth daily. To lower cholesterol    Dispense:  90  tablet    Refill:  1   ondansetron (ZOFRAN) 4 MG tablet    Sig: Take 1 tablet (4 mg total) by mouth every 8 (eight) hours as needed for nausea or vomiting.    Dispense:  20 tablet    Refill:  0    Follow-up: Return in about 3 months (around 03/30/2023) for Chronic medical conditions and obesity.       Hoy Register, MD, FAAFP. [facility] [place], [place] [phone]   12/28/2022, 4:51 PM

## 2023-02-02 ENCOUNTER — Ambulatory Visit: Payer: Self-pay

## 2023-02-02 NOTE — Telephone Encounter (Signed)
  Chief Complaint: HTN Symptoms: BP 162/92, HA, dizziness and blurred vision Frequency: today, has taken HTN med but out of cholesterol med  Pertinent Negatives: NA Disposition: [] ED /[] Urgent Care (no appt availability in office) / [] Appointment(In office/virtual)/ []  [place] [facility]/ [] Home Care/ [] Refused Recommended Disposition /[x] [place] [facility]/ []  Follow-up with PCP Additional Notes: pt's neighbor calling in for pt, pt was able to verify self and give consent. Almarie said pt called her last night reporting not feeling well. Came over and checked BP, elevated and having dizziness and vision changes. Had HA but has resolved with Tylenol . No appts until 02/23/23, scheduled OV then and recommended checking for MU, current schedule not showing future dates but will keep checking since pt lives near one of the locations MU goes, offered UC but they preferred to see MU or OV. Care advice given and pt verbalized understanding.   Reason for Disposition  [1] Taking BP medications AND [2] feels is having side effects (e.g., impotence, cough, dizzy upon standing)  Answer Assessment - Initial Assessment Questions 1. BLOOD PRESSURE: What is the blood pressure? Did you take at least two measurements 5 minutes apart?     162/92 2. ONSET: When did you take your blood pressure?     Today  3. HOW: How did you take your blood pressure? (e.g., automatic home BP monitor, visiting nurse)     Home BP 4. HISTORY: Do you have a history of high blood pressure?     yes 5. MEDICINES: Are you taking any medicines for blood pressure? Have you missed any doses recently?     Yes and no  6. OTHER SYMPTOMS: Do you have any symptoms? (e.g., blurred vision, chest pain, difficulty breathing, headache, weakness)     Blurred vision and dizziness, had HA but resolved  Protocols used: Blood Pressure - High-A-AH

## 2023-02-02 NOTE — Telephone Encounter (Signed)
 Noted.

## 2023-02-03 ENCOUNTER — Other Ambulatory Visit: Payer: Self-pay

## 2023-02-08 ENCOUNTER — Other Ambulatory Visit: Payer: Self-pay

## 2023-02-08 DIAGNOSIS — I480 Paroxysmal atrial fibrillation: Secondary | ICD-10-CM

## 2023-02-15 NOTE — Congregational Nurse Program (Signed)
  Dept: [phone]   Congregational Nurse Program Note  Date of Encounter: 02/15/2023  Past Medical History: Past Medical History:  Diagnosis Date   Acute kidney injury (HCC) 03/02/2019   Acute respiratory failure with hypoxia (HCC) 02/22/2019   Atrial fibrillation with RVR (HCC) 02/26/2019   COVID-19 02/22/2019   Dysrhythmia    para. atrial fibrillation   Flu-like symptoms 02/22/2019   Hypertension    Knee osteoarthritis 10/24/2017   Pneumonia     Encounter Details:  Community Questionnaire - 02/15/23 1146       Questionnaire   Ask client: Do you give verbal consent for me to treat you today? Yes    Student Assistance N/A    Location Patient Served  [facility]    Encounter Setting CN site    Population Status Migrant/Refugee    Engineer, building services or [place] Insurance    Insurance/Financial Assistance Referral N/A    Medication Have Medication Insecurities    Medical Provider Yes    Screening Referrals Made N/A    Medical Referrals Made N/A    Medical Appointment Completed [facility] P[facility]/Clinic;Vaccination    CNP Interventions Advocate/Support;Navigate Healthcare System;Counsel;Educate;Case Management    Screenings CN Performed Blood Pressure    ED Visit Averted Yes    Life-Saving Intervention Made N/A            Patient with history of gout c/o left knee pain. She has taken 2 tablets of Colchicine  as prescribed. Can repeat 1 tablet if still in pain after an hour. I have asked her to repeat one more tablet as prescribed. Noted appointment is already scheduled for 02/23/23. Details of the appointment given to patient.  Perla Bradford Josef Tourigny RN BSN PCCN  [facility] [phone]-cell [phone]-office

## 2023-02-22 ENCOUNTER — Telehealth: Payer: Self-pay

## 2023-02-22 NOTE — Telephone Encounter (Signed)
Spoke with the patient through a [name] interpretor and rescheduled her pacemaker implant with Dr. Jimmey Ralph to 2/11 at 1230.

## 2023-02-23 ENCOUNTER — Ambulatory Visit: Payer: Self-pay | Attending: Physician Assistant | Admitting: Physician Assistant

## 2023-02-23 ENCOUNTER — Other Ambulatory Visit: Payer: Self-pay

## 2023-02-23 ENCOUNTER — Telehealth: Payer: Self-pay | Admitting: Cardiology

## 2023-02-23 ENCOUNTER — Encounter: Payer: Self-pay | Admitting: Physician Assistant

## 2023-02-23 VITALS — BP 156/86 | HR 65 | Wt 203.6 lb

## 2023-02-23 DIAGNOSIS — M10072 Idiopathic gout, left ankle and foot: Secondary | ICD-10-CM

## 2023-02-23 DIAGNOSIS — E669 Obesity, unspecified: Secondary | ICD-10-CM

## 2023-02-23 DIAGNOSIS — D696 Thrombocytopenia, unspecified: Secondary | ICD-10-CM

## 2023-02-23 DIAGNOSIS — Z758 Other problems related to medical facilities and other health care: Secondary | ICD-10-CM

## 2023-02-23 DIAGNOSIS — Z603 Acculturation difficulty: Secondary | ICD-10-CM

## 2023-02-23 DIAGNOSIS — E7800 Pure hypercholesterolemia, unspecified: Secondary | ICD-10-CM

## 2023-02-23 DIAGNOSIS — I10 Essential (primary) hypertension: Secondary | ICD-10-CM

## 2023-02-23 DIAGNOSIS — E1169 Type 2 diabetes mellitus with other specified complication: Secondary | ICD-10-CM

## 2023-02-23 DIAGNOSIS — Z6836 Body mass index (BMI) 36.0-36.9, adult: Secondary | ICD-10-CM

## 2023-02-23 DIAGNOSIS — H539 Unspecified visual disturbance: Secondary | ICD-10-CM

## 2023-02-23 DIAGNOSIS — E66812 Obesity, class 2: Secondary | ICD-10-CM

## 2023-02-23 MED ORDER — COLCHICINE 0.6 MG PO TABS
ORAL_TABLET | ORAL | 1 refills | Status: DC
  Filled 2023-02-23: qty 90, fill #0
  Filled 2023-03-03: qty 90, 90d supply, fill #0
  Filled 2023-09-06 (×2): qty 90, 90d supply, fill #1

## 2023-02-23 MED ORDER — BLOOD PRESSURE CUFF MISC
1.0000 | Freq: Every day | 0 refills | Status: DC
  Filled 2023-02-23: qty 1, fill #0

## 2023-02-23 MED ORDER — AMLODIPINE BESYLATE 5 MG PO TABS
5.0000 mg | ORAL_TABLET | Freq: Every day | ORAL | 1 refills | Status: DC
  Filled 2023-02-23: qty 90, 90d supply, fill #0
  Filled 2023-06-02 (×2): qty 90, 90d supply, fill #1

## 2023-02-23 NOTE — Progress Notes (Signed)
Patient ID: [name] [name], female   DOB: 13-Jan-1951, 73 y.o.   MRN: [id number]   [name] [name], is a 73 y.o. female  WUJ:[id number]  [id number]  DOB - [birth year]  Chief Complaint  Patient presents with   Hypertension   Dizziness       Subjective:   [name] [name] is a 73 y.o. female here today for several issues.  No longer taking diltiazem.  Scheduled for pacemaker next month.    She does not have a BP cuff so does not take BP at home.  Has noticed continue vision abnormalities.  She was last seen by [facility] optometry more than a year ago.    Occasional dizziness.  Usu when she stands up too fast.  No CP.  Drinks enough water to take meds.     No problems updated.  ALLERGIES: No Known Allergies  PAST MEDICAL HISTORY: Past Medical History:  Diagnosis Date   Acute kidney injury (HCC) 03/02/2019   Acute respiratory failure with hypoxia (HCC) 02/22/2019   Atrial fibrillation with RVR (HCC) 02/26/2019   COVID-19 02/22/2019   Dysrhythmia    para. atrial fibrillation   Flu-like symptoms 02/22/2019   Hypertension    Knee osteoarthritis 10/24/2017   Pneumonia     MEDICATIONS AT HOME: Prior to Admission medications   Medication Sig Start Date End Date Taking? Authorizing Provider  apixaban (ELIQUIS) 5 MG TABS tablet Take 1 tablet (5 mg total) by mouth 2 (two) times daily. 03/17/22  Yes Hoy Register, MD  Blood Pressure Monitoring (BLOOD PRESSURE CUFF) MISC 1 each by Does not apply route daily. 02/23/23  Yes Georgian Co M, PA-C  rosuvastatin (CRESTOR) 10 MG tablet Take 1 tablet (10 mg total) by mouth daily. To lower cholesterol 12/28/22  Yes Newlin, Enobong, MD  amLODipine (NORVASC) 5 MG tablet Take 1 tablet (5 mg total) by mouth daily. 02/23/23   Anders Simmonds, PA-C  colchicine 0.6 MG tablet Take 2 tablets by mouth at the onset of a gout flare, repeat 1 tablet in 1 hour if flare continues. 02/23/23   Oddis Westling, Marzella Schlein, PA-C    ROS: Neg  HEENT Neg resp Neg GI Neg GU Neg MS Neg psych  Objective:   Vitals:   02/23/23 1449 02/23/23 1506  BP: (!) 179/90 (!) 156/86  Pulse: 65   SpO2: 97%   Weight: 203 lb 9.6 oz (92.4 kg)    Exam General appearance : Awake, alert, not in any distress. Speech Clear. Not toxic looking HEENT: Atraumatic and Normocephalic Mucus membranes dry. PERRLA and EOM intact B.  Appears to have cataracts B.   Neck: Supple, no JVD. No cervical lymphadenopathy.  Chest: Good air entry bilaterally, CTAB.  No rales/rhonchi/wheezing CVS: S1 S2 regular, no murmurs.  Extremities: B/L Lower Ext shows no edema, both legs are warm to touch Neurology: Awake alert, and oriented X 3, CN II-XII intact, Non focal Skin: No Rash  Data Review Lab Results  Component Value Date   HGBA1C 5.6 12/28/2022   HGBA1C 5.7 03/17/2022   HGBA1C 5.6 09/22/2021    Assessment & Plan   1. Acute idiopathic gout of left foot - colchicine 0.6 MG tablet; Take 2 tablets by mouth at the onset of a gout flare, repeat 1 tablet in 1 hour if flare continues.  Dispense: 90 tablet; Refill: 1  2. Essential hypertension Continue amlodipine at 5 mg and monitor BP daily - amLODipine (NORVASC) 5 MG tablet; Take 1 tablet (5 mg total)  by mouth daily.  Dispense: 90 tablet; Refill: 1 - Blood Pressure Monitoring (BLOOD PRESSURE CUFF) MISC; 1 each by Does not apply route daily.  Dispense: 1 each; Refill: 0 - Basic Metabolic Panel Sit still and quiet for 5 mins and practice deep breathing then check your blood pressure.  Write this down and do this daily. Goal blood pressure <130/<85 Drink 64 to 80 ounces water daily  3. Hypercholesterolemia Continue crestor-has 1 RF on bottle  4. Vision abnormalities (Primary) - Ambulatory referral to Ophthalmology  5. Thrombocytopenia (HCC) - CBC with Differential  6. Type 2 diabetes mellitus with obesity (HCC) A1c 5.6 last month-diet controlled  7. Language barrier She has a friend with her that  also interprets.  Patient declines AMN.      Return in about 4 weeks (around 03/23/2023) for [facility] for BP check; PCP in 3 months.  The patient was given clear instructions to go to ER or return to medical center if symptoms don't improve, worsen or new problems develop. The patient verbalized understanding. The patient was told to call to get lab results if they haven't heard anything in the next week.      Georgian Co, PA-C [facility] and [facility] [place], [place] [phone]   02/23/2023, 3:17 PM

## 2023-02-23 NOTE — Telephone Encounter (Signed)
Follow Up:     She is returning a call from today. She would like a return call tomorrow morning.She said [place] and it is night there.to

## 2023-02-23 NOTE — Patient Instructions (Addendum)
(  Per Dr.Parkers Notes) you are scheduled for Pacemaker Implant on Monday, February 10 with Dr. Michele Rockers.Please arrive at the Main Entrance A at [facility]: [address] [place], [place] [address] at 5:30 AM      Sit still and quiet for 5 mins and practice deep breathing then check your blood pressure.  Write this down and do this daily.  Goal blood pressure <130/<85  Drink 64 to 80 ounces water daily

## 2023-02-24 ENCOUNTER — Telehealth: Payer: Self-pay

## 2023-02-24 LAB — CBC WITH DIFFERENTIAL/PLATELET
Basophils Absolute: 0 10*3/uL (ref 0.0–0.2)
Basos: 0 %
EOS (ABSOLUTE): 0.1 10*3/uL (ref 0.0–0.4)
Eos: 2 %
Hematocrit: 43.4 % (ref 34.0–46.6)
Hemoglobin: 13.4 g/dL (ref 11.1–15.9)
Immature Grans (Abs): 0 10*3/uL (ref 0.0–0.1)
Immature Granulocytes: 1 %
Lymphocytes Absolute: 2.5 10*3/uL (ref 0.7–3.1)
Lymphs: 45 %
MCH: 25 pg — ABNORMAL LOW (ref 26.6–33.0)
MCHC: 30.9 g/dL — ABNORMAL LOW (ref 31.5–35.7)
MCV: 81 fL (ref 79–97)
Monocytes Absolute: 0.6 10*3/uL (ref 0.1–0.9)
Monocytes: 10 %
Neutrophils Absolute: 2.3 10*3/uL (ref 1.4–7.0)
Neutrophils: 42 %
Platelets: 96 10*3/uL — CL (ref 150–450)
RBC: 5.35 x10E6/uL — ABNORMAL HIGH (ref 3.77–5.28)
RDW: 18.6 % — ABNORMAL HIGH (ref 11.7–15.4)
WBC: 5.5 10*3/uL (ref 3.4–10.8)

## 2023-02-24 LAB — BASIC METABOLIC PANEL
BUN/Creatinine Ratio: 8 — ABNORMAL LOW (ref 12–28)
BUN: 7 mg/dL — ABNORMAL LOW (ref 8–27)
CO2: 22 mmol/L (ref 20–29)
Calcium: 9.4 mg/dL (ref 8.7–10.3)
Chloride: 104 mmol/L (ref 96–106)
Creatinine, Ser: 0.9 mg/dL (ref 0.57–1.00)
Glucose: 82 mg/dL (ref 70–99)
Potassium: 4.1 mmol/L (ref 3.5–5.2)
Sodium: 143 mmol/L (ref 134–144)
eGFR: 67 mL/min/{1.73_m2} (ref >59)

## 2023-02-24 NOTE — Telephone Encounter (Signed)
Pt was called and is aware of results, DOB was confirmed.  Interpreter id # [id number] Gunnar Fusi

## 2023-02-24 NOTE — Telephone Encounter (Signed)
-----   Message from Georgian Co sent at 02/24/2023 10:18 AM EST ----- Please call patient. Her platelets are low but a little better than before/stable. Kidney function and electrolytes are normal. Follow up as planned.  Thanks, Georgian Co, PA-C

## 2023-02-24 NOTE — Telephone Encounter (Signed)
Left message for Ms. Smith (EC) to call back.

## 2023-02-26 ENCOUNTER — Emergency Department (HOSPITAL_COMMUNITY): Payer: Self-pay

## 2023-02-26 ENCOUNTER — Emergency Department (HOSPITAL_COMMUNITY)
Admission: EM | Admit: 2023-02-26 | Discharge: 2023-02-27 | Disposition: A | Discharge status: Home / Self Care | Payer: Self-pay | Attending: Emergency Medicine | Admitting: Emergency Medicine

## 2023-02-26 ENCOUNTER — Other Ambulatory Visit: Payer: Self-pay

## 2023-02-26 DIAGNOSIS — Y92410 Unspecified street and highway as the place of occurrence of the external cause: Secondary | ICD-10-CM | POA: Diagnosis not present

## 2023-02-26 DIAGNOSIS — R1032 Left lower quadrant pain: Secondary | ICD-10-CM | POA: Insufficient documentation

## 2023-02-26 DIAGNOSIS — R079 Chest pain, unspecified: Secondary | ICD-10-CM | POA: Insufficient documentation

## 2023-02-26 DIAGNOSIS — Z7901 Long term (current) use of anticoagulants: Secondary | ICD-10-CM | POA: Insufficient documentation

## 2023-02-26 DIAGNOSIS — R519 Headache, unspecified: Secondary | ICD-10-CM | POA: Insufficient documentation

## 2023-02-26 DIAGNOSIS — I4891 Unspecified atrial fibrillation: Secondary | ICD-10-CM | POA: Insufficient documentation

## 2023-02-26 DIAGNOSIS — S40021A Contusion of right upper arm, initial encounter: Secondary | ICD-10-CM | POA: Insufficient documentation

## 2023-02-26 DIAGNOSIS — I10 Essential (primary) hypertension: Secondary | ICD-10-CM | POA: Insufficient documentation

## 2023-02-26 DIAGNOSIS — S161XXA Strain of muscle, fascia and tendon at neck level, initial encounter: Secondary | ICD-10-CM | POA: Insufficient documentation

## 2023-02-26 DIAGNOSIS — M79661 Pain in right lower leg: Secondary | ICD-10-CM | POA: Insufficient documentation

## 2023-02-26 DIAGNOSIS — Z79899 Other long term (current) drug therapy: Secondary | ICD-10-CM | POA: Diagnosis not present

## 2023-02-26 DIAGNOSIS — M542 Cervicalgia: Secondary | ICD-10-CM | POA: Diagnosis present

## 2023-02-26 LAB — COMPREHENSIVE METABOLIC PANEL
ALT: 47 U/L — ABNORMAL HIGH (ref 0–44)
AST: 44 U/L — ABNORMAL HIGH (ref 15–41)
Albumin: 3.8 g/dL (ref 3.5–5.0)
Alkaline Phosphatase: 73 U/L (ref 38–126)
Anion gap: 12 (ref 5–15)
BUN: 16 mg/dL (ref 8–23)
CO2: 23 mmol/L (ref 22–32)
Calcium: 9.2 mg/dL (ref 8.9–10.3)
Chloride: 107 mmol/L (ref 98–111)
Creatinine, Ser: 1.12 mg/dL — ABNORMAL HIGH (ref 0.44–1.00)
GFR, Estimated: 52 mL/min — ABNORMAL LOW (ref >60)
Glucose, Bld: 90 mg/dL (ref 70–99)
Potassium: 3.6 mmol/L (ref 3.5–5.1)
Sodium: 142 mmol/L (ref 135–145)
Total Bilirubin: 0.6 mg/dL (ref 0.0–1.2)
Total Protein: 8.3 g/dL — ABNORMAL HIGH (ref 6.5–8.1)

## 2023-02-26 LAB — CBC WITH DIFFERENTIAL/PLATELET
Abs Immature Granulocytes: 0.01 10*3/uL (ref 0.00–0.07)
Basophils Absolute: 0 10*3/uL (ref 0.0–0.1)
Basophils Relative: 0 %
Eosinophils Absolute: 0 10*3/uL (ref 0.0–0.5)
Eosinophils Relative: 0 %
HCT: 36.7 % (ref 36.0–46.0)
Hemoglobin: 11.8 g/dL — ABNORMAL LOW (ref 12.0–15.0)
Immature Granulocytes: 0 %
Lymphocytes Relative: 26 %
Lymphs Abs: 1.9 10*3/uL (ref 0.7–4.0)
MCH: 24.6 pg — ABNORMAL LOW (ref 26.0–34.0)
MCHC: 32.2 g/dL (ref 30.0–36.0)
MCV: 76.6 fL — ABNORMAL LOW (ref 80.0–100.0)
Monocytes Absolute: 0.8 10*3/uL (ref 0.1–1.0)
Monocytes Relative: 11 %
Neutro Abs: 4.7 10*3/uL (ref 1.7–7.7)
Neutrophils Relative %: 63 %
Platelets: 89 10*3/uL — ABNORMAL LOW (ref 150–400)
RBC: 4.79 MIL/uL (ref 3.87–5.11)
RDW: 18.7 % — ABNORMAL HIGH (ref 11.5–15.5)
WBC: 7.4 10*3/uL (ref 4.0–10.5)
nRBC: 0 % (ref 0.0–0.2)

## 2023-02-26 LAB — I-STAT CHEM 8, ED
BUN: 14 mg/dL (ref 8–23)
Calcium, Ion: 1.14 mmol/L — ABNORMAL LOW (ref 1.15–1.40)
Chloride: 108 mmol/L (ref 98–111)
Creatinine, Ser: 1.2 mg/dL — ABNORMAL HIGH (ref 0.44–1.00)
Glucose, Bld: 85 mg/dL (ref 70–99)
HCT: 39 % (ref 36.0–46.0)
Hemoglobin: 13.3 g/dL (ref 12.0–15.0)
Potassium: 3.6 mmol/L (ref 3.5–5.1)
Sodium: 144 mmol/L (ref 135–145)
TCO2: 25 mmol/L (ref 22–32)

## 2023-02-26 MED ORDER — FENTANYL CITRATE PF 50 MCG/ML IJ SOSY
50.0000 ug | PREFILLED_SYRINGE | Freq: Once | INTRAMUSCULAR | Status: AC
  Administered 2023-02-26: 50 ug via INTRAVENOUS
  Filled 2023-02-26: qty 1

## 2023-02-26 MED ORDER — CYCLOBENZAPRINE HCL 5 MG PO TABS
5.0000 mg | ORAL_TABLET | Freq: Two times a day (BID) | ORAL | 0 refills | Status: DC | PRN
  Filled 2023-02-26: qty 30, 8d supply, fill #0

## 2023-02-26 MED ORDER — CYCLOBENZAPRINE HCL 10 MG PO TABS
5.0000 mg | ORAL_TABLET | Freq: Once | ORAL | Status: AC
Start: 2023-02-26 — End: 2023-02-26
  Administered 2023-02-26: 5 mg via ORAL
  Filled 2023-02-26: qty 1

## 2023-02-26 MED ORDER — LIDOCAINE 5 % EX PTCH
1.0000 | MEDICATED_PATCH | CUTANEOUS | 0 refills | Status: DC
  Filled 2023-02-26: qty 30, 30d supply, fill #0

## 2023-02-26 MED ORDER — IOHEXOL 300 MG/ML  SOLN
80.0000 mL | Freq: Once | INTRAMUSCULAR | Status: AC | PRN
  Administered 2023-02-26: 80 mL via INTRAVENOUS

## 2023-02-26 NOTE — ED Notes (Signed)
Phone interpreter used to explained to pt her discharge, follow up care, and prescriptions.  [facility] called and transport arranged.
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
## 2023-02-26 NOTE — ED Notes (Signed)
Pt transported to CT and returned to pt room

## 2023-02-27 ENCOUNTER — Other Ambulatory Visit: Payer: Self-pay

## 2023-02-27 NOTE — ED Notes (Signed)
Pt's husband spoke with me and gave the following information to be used for contact when pt is transported:  Jilda Panda - Husband  Phone number: [phone]

## 2023-02-28 ENCOUNTER — Telehealth: Payer: Self-pay | Admitting: Cardiology

## 2023-02-28 NOTE — Telephone Encounter (Signed)
They called to talk with Dr. Jimmey Ralph or nurse

## 2023-02-28 NOTE — Telephone Encounter (Signed)
Spoke with the patient and her husband. Reviewed information on upcoming procedure. They verbalized understanding. Letter with instructions was mailed out to them last week.

## 2023-03-02 ENCOUNTER — Telehealth: Payer: Self-pay | Admitting: Cardiology

## 2023-03-02 NOTE — Telephone Encounter (Signed)
Follow Up:    Patient is calling [name] back. She said it [name] [name] can call her today. She is out of the country and says she can receive calls until 5:00 today.

## 2023-03-02 NOTE — Telephone Encounter (Signed)
Spoke with [name] [name] [facility]) and advised on the date change of the patient's procedure. She will be the one taking the patient to and from her procedure. Advised that I have mailed out instructions to the patient's home. She is going to make sure they are received and that the patient follows all instructions.

## 2023-03-03 ENCOUNTER — Other Ambulatory Visit: Payer: Self-pay
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
## 2023-03-06 ENCOUNTER — Telehealth: Payer: Self-pay
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
## 2023-03-06 NOTE — Telephone Encounter (Signed)
Inteperter #  [phone]  Spoke with patient.patient Voice that she is having pain on left side for hip to under left chest pain 9/10 sharp pain . Has bruise to right arm which is swollen and goes al the way to my breast pain 8/10 sharp pain. Patient does  not want to go to ED to be seen. Patient schedule for 03/07/2023. Transportation scheduled. Patient advised to ready for pick-up at 10:30 am on tomorrow.

## 2023-03-07 ENCOUNTER — Telehealth: Payer: Self-pay

## 2023-03-07 ENCOUNTER — Ambulatory Visit: Payer: Self-pay | Attending: Family Medicine | Admitting: Family Medicine

## 2023-03-07 ENCOUNTER — Encounter: Payer: Self-pay | Admitting: Family Medicine

## 2023-03-07 ENCOUNTER — Other Ambulatory Visit: Payer: Self-pay

## 2023-03-07 VITALS — BP 125/75 | HR 63 | Ht 63.0 in | Wt 212.4 lb

## 2023-03-07 DIAGNOSIS — T148XXA Other injury of unspecified body region, initial encounter: Secondary | ICD-10-CM

## 2023-03-07 DIAGNOSIS — N6489 Other specified disorders of breast: Secondary | ICD-10-CM

## 2023-03-07 DIAGNOSIS — S40021A Contusion of right upper arm, initial encounter: Secondary | ICD-10-CM

## 2023-03-07 MED ORDER — ACETAMINOPHEN-CODEINE 300-30 MG PO TABS
1.0000 | ORAL_TABLET | Freq: Every evening | ORAL | 0 refills | Status: DC | PRN
  Filled 2023-03-07: qty 30, 30d supply, fill #0

## 2023-03-07 MED ORDER — LIDOCAINE 5 % EX PTCH
1.0000 | MEDICATED_PATCH | CUTANEOUS | 1 refills | Status: DC
  Filled 2023-03-07: qty 30, 30d supply, fill #0

## 2023-03-07 MED ORDER — CYCLOBENZAPRINE HCL 5 MG PO TABS
5.0000 mg | ORAL_TABLET | Freq: Two times a day (BID) | ORAL | 1 refills | Status: DC | PRN
  Filled 2023-03-07: qty 60, 30d supply, fill #0

## 2023-03-07 NOTE — Patient Instructions (Signed)
 Hmatome Hematoma Un hmatome est un amas de sang sous la peau  l'intrieur d'un organe, d'un espace corporel, d'un espace articulaire ou d'un autre tissu. Le sang peut s'paissir adult nurse) et former iac/interactivecorp bosse que l'on peut voir et clinical research associate. Cette bosse est souvent dure et peut devenir sensible et douloureuse. La plupart des r.r. donnelley en quelques jours ou quelques semaines. Cependant, certains hmatomes peuvent tre graves et [employer] La taille des conseco varier, allant de trs petits  trs grands. Quelles sont les causes ? Cette affection est cause par : Un traumatisme provoqu par un objet contondant ou perforant. Une fuite d'un vaisseau sanguin situ sous la peau. Certaines procdures mdicales, notamment les interventions chirurgicales, telles que des american electric power, un lifting facial et les interventions chirurgicales aux articulations. Certaines affections mdicales qui entranent un saignement ou des family dollar stores. Plusieurs hmatomes peuvent apparatre  diffrents ambulance person. Quels sont les facteurs qui augmentent le risque ? Votre risque de research scientist (life sciences) affection est plus lev si : Vous tes amr corporation. Vous prenez des anticoagulants. Vous prenez rgulirement des AINS, comme de l'ibuprofne, pour soulager lyondell [employer]. Vous pratiquez des sports de contact. Quels sont les signes ou symptmes ?  Les symptmes de cette affection dpendent de l'endroit o se situe l'hmatome.  Les symptmes courants d'un hmatome sous-cutan comprennent : Une protubrance ferme sur calpine corporation. Une douleur et une sensibilit au niveau de la zone. Un hmatome (un bleu).  l'endroit o se situe l'hmatome, la peau peut devenir bleue, bleu fonc, mauve-rouge ou jauntre (dcoloration) si l'hmatome se trouve prs de la surface de la peau. Les symptmes courants d'un hmatome profond situ dans les tissus ou les espaces corporels peuvent  tre moins vidents. Ils comprennent : Une accumulation de sang research scientist (physical sciences) (hmatome intra-abdominal). Cela peut provoquer une douleur dans l'abdomen, [place], un vanouissement et un essoufflement. Une accumulation de sang dans la tte (hmatome intracrnien). Cela peut provoquer un mal de tte ou des symptmes tels que faiblesse, troubles de l'locution ou de la comprhension, ou altration de financial planner. Comment se fait le diagnostic ? Le diagnostic de cette affection repose sur : Les antcdents mdicaux. Un examen clinique. Des examens d'imagerie, tels que l'chographie ou la tomodensitomtrie. Ceux-ci pourraient tre ncessaires si votre prestataire de soins de sant suspecte la prsence d'un hmatome dans les tissus ou espaces corporels plus profonds. Des analyses de sang. Celles-ci pourraient tre ncessaires si votre prestataire de soins de sant estime que l'hmatome est provoqu par [employer] international corporation. Comment cette affection est-elle traite ? Le traitement de cette affection dpend de la cause, de la taille et de l'emplacement de l'hmatome. Le traitement pourra inclure : Ne rien product/process development scientist. La plupart des intel pas besoin d'tre traits, car beaucoup d'entre eux disparaissent tout seuls. Une intervention chirurgicale ou une surveillance troite. Cela peut tre ncessaire pour les gros hmatomes ou les hmatomes qui affectent des organes vitaux. Des mdicaments. Des mdicaments peuvent tre administrs s'il existe une cause mdicale sous-jacente  l'hmatome. Suivez les instructions suivantes  domicile : Designer, Jewellery en charge de armed forces logistics/support/administrative officer, de la rigidit et des gonflements  Si le prestataire de soins de sant vous le recommande, appliquez de la glace sur la zone [place]. Pour ce faire : Kassie de la glace dans un sac en plastique. Placez une serviette entre votre peau et le sac. Appliquez la glace pendant 20 minutes, 2  3 fois par jour pendant les deux premiers  jours.  Si votre peau devient rouge vif, retirez la glace immdiatement pour viter toute lsion au niveau de la peau. Vous risqueriez d'endommager votre peau plus facilement si vous ne pouvez pas ressentir liz claiborne, special educational needs teacher froid. Si votre prestataire de soins de sant vous le recommande, appliquez de la group 1 automotive zone touche en suivant ses instructions. Utilisez la source de marketing executive vous recommande votre prestataire de soins de sant, par exemple une compresse  chaleur humide ou un coussin chauffant. Placez une serviette entre votre peau et la source de training and development officer. Laissez la source de chaleur en place pendant 20  30 minutes. Si votre peau devient rouge vif, retirez la source de scientist, physiological pour ne pas vous brler. Vous risqueriez de vous brler plus facilement si vous ne pouvez pas ressentir armed forces logistics/support/administrative officer, special educational needs teacher froid. Soulevez (surlevez) la zone touche au-dessus du niveau de votre cour pendant que vous tes assis(e) ou couch(e). Si cela vous a t recommand, enveloppez la zone touche  l'aide d'un bandage lastique. Le bandage permet d'exercer une pression (compression) sur la zone, ce southwest airlines favoriser la rduction du gonflement et la gurison. Ne serrez pas trop le bandage au niveau de la zone touche. Si l'hmatome se situe sur une jambe ou un pied (extrmit infrieure) et s'il est douloureux, votre prestataire de soins de sant pourra vous recommander d'utiliser des bquilles. Utilisez-les conformment aux instructions de votre prestataire de soins de sant. Instructions gnrales Prenez vos mdicaments en vente libre et sur ordonnance conformment aux recommandations de votre prestataire de soins de sant. Mettez la zone [employer], conformment aux instructions de votre prestataire de soins de sant. Rendez-vous  toutes les visites de suivi. Votre prestataire de soins de sant pourrait vouloir voir comment votre hmatome volue advice worker. Prenez contact avec un prestataire de soins de sant si : Vous avez de la fivre. Le gonflement ou la dcoloration s'aggravent. Vous dveloppez d'autres hmatomes. La emcor ou n'est pas calme par la prise de mdicaments. La peau sur l'hmatome craquelle ou commence  saigner. Demandez immdiatement de l'aide si : Votre hmatome est situ au niveau du thorax ou de l'abdomen et que vous ressentez une faiblesse, un essoufflement ou une altration de financial planner. Vous avez un hmatome sur votre cuir chevelu caus par iac/interactivecorp chute ou une blessure et que vous avez galement : Un mal de tte federal-mogul. Des troubles de la parole ou des difficults de comprhension. Une asthnie. Un changement dans votre tat de vigilance ou de conscience. Ces symptmes peuvent constituer une urgence mdicale. Demandez de l'aide immdiatement. Appelez le 911. N'attendez pas de voir si les symptmes disparaissent. Ne prenez event organiser, faites-vous accompagner  l'hpital. Ces conseils et renseignements ne sauraient se substituer  l'avis mdical de votre prestataire de soins de sant. Par consquent, il est primordial de parler de toutes vos proccupations avec votre prestataire de soins de sant. Document Revised: 07/27/2021 Document Reviewed: 07/27/2021 Elsevier Patient Education  2024 [employer].
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
## 2023-03-13 NOTE — Pre-Procedure Instructions (Signed)
 Attempted to call patient regarding procedure instructions.  No answer.  Attempted to call [facility]- emergency contact no answer.  Instructions are as following items: Arrival time 1030 Nothing to eat or drink after midnight No meds AM of procedure Responsible person to drive you home and stay with you for 24 hrs Wash with special soap night before and morning of procedure If on anti-coagulant drug instructions Eliquis - last dose 2/7

## 2023-03-14 ENCOUNTER — Encounter (HOSPITAL_COMMUNITY)
Admission: RE | Disposition: A | Discharge status: Home / Self Care | Payer: Self-pay | Source: Home / Self Care | Attending: Cardiology

## 2023-03-14 ENCOUNTER — Ambulatory Visit (HOSPITAL_COMMUNITY)
Admission: RE | Admit: 2023-03-14 | Discharge: 2023-03-14 | Disposition: A | Discharge status: Home / Self Care | Payer: Self-pay | Attending: Cardiology | Admitting: Cardiology

## 2023-03-14 ENCOUNTER — Other Ambulatory Visit: Payer: Self-pay

## 2023-03-14 DIAGNOSIS — I495 Sick sinus syndrome: Secondary | ICD-10-CM | POA: Insufficient documentation

## 2023-03-14 DIAGNOSIS — Z539 Procedure and treatment not carried out, unspecified reason: Secondary | ICD-10-CM | POA: Insufficient documentation

## 2023-03-14 DIAGNOSIS — I480 Paroxysmal atrial fibrillation: Secondary | ICD-10-CM | POA: Insufficient documentation

## 2023-03-14 DIAGNOSIS — Z7901 Long term (current) use of anticoagulants: Secondary | ICD-10-CM | POA: Insufficient documentation

## 2023-03-14 SURGERY — PACEMAKER IMPLANT
Anesthesia: LOCAL

## 2023-03-14 MED ORDER — SODIUM CHLORIDE 0.9 % IV SOLN: INTRAVENOUS | Status: DC

## 2023-03-14 MED ORDER — POVIDONE-IODINE 10 % EX SWAB
2.0000 | Freq: Once | CUTANEOUS | Status: AC
  Administered 2023-03-14: 2 via TOPICAL

## 2023-03-14 MED ORDER — CEFAZOLIN SODIUM-DEXTROSE 2-4 GM/100ML-% IV SOLN: 2.0000 g | INTRAVENOUS | Status: DC

## 2023-03-14 MED ORDER — SODIUM CHLORIDE 0.9 % IV SOLN: 80.0000 mg | INTRAVENOUS | Status: DC

## 2023-03-14 NOTE — H&P (Signed)
  Patient presents today for pacemaker implant in setting of sinus node dysfunction, paroxysmal atrial fibrillation and tachycardia-bradycardia syndrome. Unfortunately, she did not stop her anti-coagulation. Given the elective nature of the procedure, we elected to postpone and hold Eliquis 48 hours before implant. Tentatively scheduled for 2/27. Last dose of Eliquis will be night of 2/24.  Signed, Nobie Putnam, MD

## 2023-03-16 ENCOUNTER — Inpatient Hospital Stay: Payer: Self-pay | Admitting: Physician Assistant

## 2023-03-28 ENCOUNTER — Telehealth (HOSPITAL_COMMUNITY): Payer: Self-pay

## 2023-03-28 NOTE — Telephone Encounter (Signed)
 Patient's PPM inplant was previously cancelled due to patient did not hold stop her anticoagulation.   Called placed to patient to confirm she stopped taking Eliquis 48 hours prior, as instructed per Dr. Jimmey Ralph. Patient reports her last dose was yesterday evening, on 03/27/23.    Confirmed patient is scheduled for Permanent transvenous pacemaker (PPM) on Thursday, February 27 with Dr. Michele Rockers. Instructed patient to arrive at the Main Entrance A at [facility]: [address] [place], [place] [address] and check in at Admitting at 9:30 AM.  Advised of plan to go home the same day and will only stay overnight if medically necessary. You MUST have a responsible adult to drive you home and MUST be with you the first 24 hours after you arrive home or your procedure could be cancelled.  Patient verbalized understanding to information provided and is agreeable to proceed with procedure.

## 2023-03-29 ENCOUNTER — Ambulatory Visit: Payer: Self-pay

## 2023-03-30 ENCOUNTER — Encounter (HOSPITAL_COMMUNITY)
Admission: RE | Disposition: A | Discharge status: Home / Self Care | Payer: Self-pay | Source: Home / Self Care | Attending: Cardiology

## 2023-03-30 ENCOUNTER — Other Ambulatory Visit: Payer: Self-pay

## 2023-03-30 ENCOUNTER — Ambulatory Visit (HOSPITAL_COMMUNITY)
Admission: RE | Admit: 2023-03-30 | Discharge: 2023-03-31 | Disposition: A | Discharge status: Home / Self Care | Payer: Self-pay | Attending: Cardiology | Admitting: Cardiology

## 2023-03-30 ENCOUNTER — Encounter (HOSPITAL_COMMUNITY): Payer: Self-pay | Admitting: Cardiology

## 2023-03-30 DIAGNOSIS — I480 Paroxysmal atrial fibrillation: Secondary | ICD-10-CM | POA: Insufficient documentation

## 2023-03-30 DIAGNOSIS — I482 Chronic atrial fibrillation, unspecified: Secondary | ICD-10-CM

## 2023-03-30 DIAGNOSIS — D6869 Other thrombophilia: Secondary | ICD-10-CM | POA: Insufficient documentation

## 2023-03-30 DIAGNOSIS — Z7901 Long term (current) use of anticoagulants: Secondary | ICD-10-CM | POA: Insufficient documentation

## 2023-03-30 DIAGNOSIS — I340 Nonrheumatic mitral (valve) insufficiency: Secondary | ICD-10-CM | POA: Insufficient documentation

## 2023-03-30 DIAGNOSIS — I130 Hypertensive heart and chronic kidney disease with heart failure and stage 1 through stage 4 chronic kidney disease, or unspecified chronic kidney disease: Secondary | ICD-10-CM | POA: Insufficient documentation

## 2023-03-30 DIAGNOSIS — I495 Sick sinus syndrome: Secondary | ICD-10-CM | POA: Insufficient documentation

## 2023-03-30 DIAGNOSIS — N189 Chronic kidney disease, unspecified: Secondary | ICD-10-CM | POA: Insufficient documentation

## 2023-03-30 DIAGNOSIS — Z79899 Other long term (current) drug therapy: Secondary | ICD-10-CM | POA: Insufficient documentation

## 2023-03-30 DIAGNOSIS — I472 Ventricular tachycardia, unspecified: Secondary | ICD-10-CM | POA: Insufficient documentation

## 2023-03-30 HISTORY — PX: PACEMAKER IMPLANT: EP1218

## 2023-03-30 LAB — BASIC METABOLIC PANEL
Anion gap: 10 (ref 5–15)
BUN: 10 mg/dL (ref 8–23)
CO2: 24 mmol/L (ref 22–32)
Calcium: 8.5 mg/dL — ABNORMAL LOW (ref 8.9–10.3)
Chloride: 106 mmol/L (ref 98–111)
Creatinine, Ser: 0.93 mg/dL (ref 0.44–1.00)
GFR, Estimated: >60 mL/min (ref >60)
Glucose, Bld: 75 mg/dL (ref 70–99)
Potassium: 3.9 mmol/L (ref 3.5–5.1)
Sodium: 140 mmol/L (ref 135–145)

## 2023-03-30 LAB — CBC
HCT: 35 % — ABNORMAL LOW (ref 36.0–46.0)
Hemoglobin: 11.2 g/dL — ABNORMAL LOW (ref 12.0–15.0)
MCH: 24.3 pg — ABNORMAL LOW (ref 26.0–34.0)
MCHC: 32 g/dL (ref 30.0–36.0)
MCV: 75.9 fL — ABNORMAL LOW (ref 80.0–100.0)
Platelets: 74 10*3/uL — ABNORMAL LOW (ref 150–400)
RBC: 4.61 MIL/uL (ref 3.87–5.11)
RDW: 18.4 % — ABNORMAL HIGH (ref 11.5–15.5)
WBC: 4.8 10*3/uL (ref 4.0–10.5)
nRBC: 0 % (ref 0.0–0.2)

## 2023-03-30 SURGERY — PACEMAKER IMPLANT
Anesthesia: LOCAL

## 2023-03-30 MED ORDER — CYCLOBENZAPRINE HCL 10 MG PO TABS
5.0000 mg | ORAL_TABLET | Freq: Two times a day (BID) | ORAL | Status: DC | PRN
  Administered 2023-03-30 – 2023-03-31 (×2): 5 mg via ORAL
  Filled 2023-03-30 (×2): qty 1

## 2023-03-30 MED ORDER — ONDANSETRON HCL 4 MG/2ML IJ SOLN: 4.0000 mg | Freq: Four times a day (QID) | INTRAMUSCULAR | Status: DC | PRN

## 2023-03-30 MED ORDER — SODIUM CHLORIDE 0.9 % IV SOLN: INTRAVENOUS | Status: DC

## 2023-03-30 MED ORDER — FENTANYL CITRATE (PF) 100 MCG/2ML IJ SOLN
INTRAMUSCULAR | Status: DC | PRN
  Administered 2023-03-30 (×2): 25 ug via INTRAVENOUS
  Administered 2023-03-30: 50 ug via INTRAVENOUS

## 2023-03-30 MED ORDER — SODIUM CHLORIDE 0.9 % IV SOLN
80.0000 mg | INTRAVENOUS | Status: AC
  Administered 2023-03-30: 80 mg

## 2023-03-30 MED ORDER — ACETAMINOPHEN 325 MG PO TABS
325.0000 mg | ORAL_TABLET | ORAL | Status: DC | PRN
  Administered 2023-03-31: 650 mg via ORAL
  Filled 2023-03-30: qty 2

## 2023-03-30 MED ORDER — CEFAZOLIN SODIUM-DEXTROSE 2-4 GM/100ML-% IV SOLN
2.0000 g | INTRAVENOUS | Status: AC
  Administered 2023-03-30: 2 g via INTRAVENOUS

## 2023-03-30 MED ORDER — AMLODIPINE BESYLATE 5 MG PO TABS
5.0000 mg | ORAL_TABLET | Freq: Every day | ORAL | Status: DC
  Administered 2023-03-31: 5 mg via ORAL
  Filled 2023-03-30: qty 1

## 2023-03-30 MED ORDER — HEPARIN (PORCINE) IN NACL 1000-0.9 UT/500ML-% IV SOLN
INTRAVENOUS | Status: DC | PRN
  Administered 2023-03-30: 500 mL

## 2023-03-30 MED ORDER — SODIUM CHLORIDE 0.9 % IV SOLN
INTRAVENOUS | Status: AC
  Filled 2023-03-30: qty 2

## 2023-03-30 MED ORDER — MIDAZOLAM HCL 2 MG/2ML IJ SOLN
INTRAMUSCULAR | Status: AC
  Filled 2023-03-30: qty 2

## 2023-03-30 MED ORDER — FENTANYL CITRATE (PF) 100 MCG/2ML IJ SOLN
INTRAMUSCULAR | Status: AC
  Filled 2023-03-30: qty 2

## 2023-03-30 MED ORDER — MIDAZOLAM HCL 5 MG/5ML IJ SOLN
INTRAMUSCULAR | Status: DC | PRN
  Administered 2023-03-30 (×2): .5 mg via INTRAVENOUS
  Administered 2023-03-30: 1 mg via INTRAVENOUS

## 2023-03-30 MED ORDER — ROSUVASTATIN CALCIUM 5 MG PO TABS
10.0000 mg | ORAL_TABLET | Freq: Every day | ORAL | Status: DC
  Administered 2023-03-30 – 2023-03-31 (×2): 10 mg via ORAL
  Filled 2023-03-30 (×2): qty 2

## 2023-03-30 MED ORDER — ACETAMINOPHEN-CODEINE 300-30 MG PO TABS: 1.0000 | ORAL_TABLET | Freq: Every evening | ORAL | Status: DC | PRN

## 2023-03-30 MED ORDER — LIDOCAINE HCL (PF) 1 % IJ SOLN
INTRAMUSCULAR | Status: AC
  Filled 2023-03-30: qty 60

## 2023-03-30 MED ORDER — POVIDONE-IODINE 10 % EX SWAB
2.0000 | Freq: Once | CUTANEOUS | Status: AC
  Administered 2023-03-30: 2 via TOPICAL

## 2023-03-30 MED ORDER — LIDOCAINE HCL (PF) 1 % IJ SOLN
INTRAMUSCULAR | Status: DC | PRN
  Administered 2023-03-30: 60 mL

## 2023-03-30 MED ORDER — CEFAZOLIN SODIUM-DEXTROSE 2-4 GM/100ML-% IV SOLN
INTRAVENOUS | Status: AC
Start: 2023-03-30 — End: 2023-03-31
  Filled 2023-03-30: qty 100

## 2023-03-30 SURGICAL SUPPLY — 14 items
CABLE SURGICAL S-101-97-12 (CABLE) ×1 IMPLANT
CATH CPS LOCATOR 3D MED (CATHETERS) IMPLANT
HELIX LOCKING TOOL (MISCELLANEOUS) ×1 IMPLANT
LEAD ULTIPACE 52 LPA1231/52 (Lead) IMPLANT
LEAD ULTIPACE 65 LPA1231/65 (Lead) IMPLANT
PACEMAKER ASSURITY DR-RF (Pacemaker) IMPLANT
PAD DEFIB RADIO PHYSIO CONN (PAD) ×1 IMPLANT
SHEATH 7FR PRELUDE SNAP 13 (SHEATH) IMPLANT
SHEATH 9FR PRELUDE SNAP 13 (SHEATH) IMPLANT
SHEATH PROBE COVER 6X72 (BAG) IMPLANT
SLITTER AGILIS HISPRO (INSTRUMENTS) IMPLANT
TOOL HELIX LOCKING (MISCELLANEOUS) IMPLANT
TRAY PACEMAKER INSERTION (PACKS) ×1 IMPLANT
WIRE HI TORQ VERSACORE-J 145CM (WIRE) IMPLANT

## 2023-03-30 NOTE — H&P (Signed)
 Electrophysiology Note:   Date:  03/30/23 ID:  [name] [name], DOB 21-Sep-1950, MRN [id number]   Primary Cardiologist: Verne Carrow, MD Electrophysiologist: Nobie Putnam, MD       History of Present Illness:   [name] [name] is a 73 y.o. female with h/o paroxysmal atrial fibrillation, HTN, CKD, non-sustained VT, SVT and mitral regurgitation who is being seen today for abnormal Zio.    Patient reports intermittent episodes of palpitations. She also endorses frequent episodes of dizziness and lightheadedness occurring several days per week. She had multiple pauses on her Zio that were associated with symptoms. These occurred at ~9am and ~1pm. She reports being awake at these times every day. She is otherwise relatively well. No new or acute complaints.   Review of systems complete and found to be negative unless listed in HPI.   Interval: Patient presents today for scheduled pacemaker implant. Reports feeling relatively well. Continue sto have intermittent palpitations and dizziness. Stopped Eliquis as instructed pre-procedure. No new or acute complaints.   Patient interview conducted via use of a Runner, broadcasting/film/video.   EP Information / Studies Reviewed:       EKG Interpretation Date/Time:                  Tuesday December 13 2022 15:05:30 EST Ventricular Rate:         68 PR Interval:                 120 QRS Duration:             102 QT Interval:                 424 QTC Calculation:450 R Axis:                         91   Text Interpretation:Normal sinus rhythm Rightward axis Septal infarct (cited on or before 08-Sep-2022) When compared with ECG of 27-Sep-2022 14:47,rate is faster. Confirmed by Nobie Putnam [phone]) on 12/13/2022 7:13:56 PM    EKG 02/27/19:    Luci Bank 10/2022:     Risk Assessment/Calculations:     CHA2DS2-VASc Score = 4   This indicates a 4.8% annual risk of stroke. The patient's score is based upon: CHF History: 0 HTN History:  1 Diabetes History: 1 Stroke History: 0 Vascular Disease History: 0 Age Score: 1 Gender Score: 1           Physical Exam:   VS:  BP (!) 146/86   Pulse 68   Ht 5\' 3"  (1.6 m)   Wt 210 lb (95.3 kg)   LMP  (LMP Unknown)   SpO2 94%   BMI 37.20 kg/m       Wt Readings from Last 3 Encounters:  12/13/22 210 lb (95.3 kg)  11/08/22 214 lb 6.4 oz (97.3 kg)  10/21/22 217 lb (98.4 kg)      GEN: Well nourished, well developed in no acute distress NECK: No JVD CARDIAC: Normal rate and regular rhythm. RESPIRATORY:  Clear to auscultation without rales, wheezing or rhonchi  ABDOMEN: Soft, non-distended EXTREMITIES:  No edema; No deformity    ASSESSMENT AND PLAN:     Patient has sinus node dysfunction with sinus pauses that are symptomatic. These do not appear to be consistent with a vagally mediated etiology. She also has a history of atrial fibrillation and palpitations. Nonsustained SVT and VT seen on Zio. Unable to treat AF, SVT and VT due  to underlying sinus dysfunction.    #Sinus node dysfunction #Paroxysmal atrial fibrillation #Tachycardia-bradycardia syndrome -Explained risks, benefits, and alternatives to pacemaker implantation, including but not limited to bleeding, infection, damage to heart or lungs, heart attack, stroke, or death.  Pt verbalized understanding and agrees to proceed today.    #Secondary hypercoagulable state due to atrial fibrillation:  - CHADSVASC score of 4.  - Tentatively plan to resume Eliquis 72 hours after implant.    Signed, Nobie Putnam, MD

## 2023-03-30 NOTE — Progress Notes (Signed)
 VAST consulted to obtain 2 20G IVs, one in right arm and one in left arm. Ultrasound utilized to assess vasculature. Right arm with extremely limited vessels appropriate for IV placement; attempted to place a 22G in forearm without success. Brachial vessel in right arm beneath artery and unable to cannulate.  20G IV placed in left antecubital. RN notified.

## 2023-03-30 NOTE — Plan of Care (Signed)

## 2023-03-31 ENCOUNTER — Ambulatory Visit (HOSPITAL_COMMUNITY): Payer: Self-pay

## 2023-03-31 ENCOUNTER — Other Ambulatory Visit (HOSPITAL_COMMUNITY): Payer: Self-pay

## 2023-03-31 ENCOUNTER — Encounter (HOSPITAL_COMMUNITY): Payer: Self-pay | Admitting: Cardiology

## 2023-03-31 MED ORDER — METOPROLOL SUCCINATE ER 25 MG PO TB24
25.0000 mg | ORAL_TABLET | Freq: Every day | ORAL | 11 refills | Status: DC
  Filled 2023-03-31: qty 30, 30d supply, fill #0

## 2023-03-31 MED ORDER — APIXABAN 5 MG PO TABS: 5.0000 mg | ORAL_TABLET | Freq: Two times a day (BID) | ORAL | Status: DC

## 2023-03-31 MED FILL — Midazolam HCl Inj 2 MG/2ML (Base Equivalent): INTRAMUSCULAR | Qty: 2 | Status: AC

## 2023-03-31 NOTE — Discharge Instructions (Signed)
 After Your Pacemaker   You have a Abbott Pacemaker  ACTIVITY Do not lift your arm above shoulder height for 1 week after your procedure. After 7 days, you may progress as below.  You should remove your sling 24 hours after your procedure, unless otherwise instructed by your provider.     Friday April 07, 2023  Saturday April 08, 2023 Sunday April 09, 2023 Monday April 10, 2023   Do not lift, push, pull, or carry anything over 10 pounds with the affected arm until 6 weeks (Friday May 12, 2023 ) after your procedure.   You may drive AFTER your wound check, unless you have been told otherwise by your provider.   Ask your healthcare provider when you can go back to work   INCISION/Dressing HOLD your Eliquis post pacemaker.  OK to resume your eliquis on 04/03/23   If a PRESSURE DRESSING (a bulky dressing that usually goes up over your shoulder) was applied or left in place, please follow instructions given by your provider on when to return to have this removed.   Monitor your Pacemaker site for redness, swelling, and drainage. Call the device clinic at [phone] if you experience these symptoms or fever/chills.  If your incision is sealed with Steri-strips or staples, you may shower 7 days after your procedure or when told by your provider. Do not remove the steri-strips or let the shower hit directly on your site. You may wash around your site with soap and water.    If you were discharged in a sling, please do not wear this during the day more than 48 hours after your surgery unless otherwise instructed. This may increase the risk of stiffness and soreness in your shoulder.   Avoid lotions, ointments, or perfumes over your incision until it is well-healed.  You may use a hot tub or a pool AFTER your wound check appointment if the incision is completely closed.  Pacemaker Alerts:  Some alerts are vibratory and others beep. These are NOT emergencies. Please call our office to let us  know. If this occurs at night or on weekends, it can wait until the next business day. Send a remote transmission.  If your device is capable of reading fluid status (for heart failure), you will be offered monthly monitoring to review this with you.   DEVICE MANAGEMENT Remote monitoring is used to monitor your pacemaker from home. This monitoring is scheduled every 91 days by our office. It allows [place] to keep an eye on the functioning of your device to ensure it is working properly. You will routinely see your Electrophysiologist annually (more often if necessary).   You should receive your ID card for your new device in 4-8 weeks. Keep this card with you at all times once received. Consider wearing a medical alert bracelet or necklace.  Your Pacemaker may be MRI compatible. This will be discussed at your next office visit/wound check.  You should avoid contact with strong electric or magnetic fields.   Do not use amateur (ham) radio equipment or electric (arc) welding torches. MP3 player headphones with magnets should not be used. Some devices are safe to use if held at least 12 inches (30 cm) from your Pacemaker. These include power tools, lawn mowers, and speakers. If you are unsure if something is safe to use, ask your health care provider.  When using your cell phone, hold it to the ear that is on the opposite side from the Pacemaker. Do not leave  your cell phone in a pocket over the Pacemaker.  You may safely use electric blankets, heating pads, computers, and microwave ovens.  Call the office right away if: You have chest pain. You feel more short of breath than you have felt before. You feel more light-headed than you have felt before. Your incision starts to open up.  This information is not intended to replace advice given to you by your health care provider. Make sure you discuss any questions you have with your health care provider.    Below Translation is via Google  Translation > please compare to English above  Na nse [place] ezali na nzela ya Google Translation > svp bokokanisa na Lingelesi likolo    Ozali na stimulateur cardiaque ya Abbott  MOSALA  [place] loboko na yo te likolo ya bosanda ya mapeka na boumeli ya poso 1 nsima ya lipaso na yo. Nsima ya mikolo 7, okoki kokende liboso ndenge ezali awa na nse.  Esengeli olongola sling na yo ngonga 24 sima ya procdure na yo, longola se soki mopesi na yo [place] yo ndenge mosusu.    [place] te, k[place] te, [place] te, to [employer] eloko moko te oyo eleki kilo 10 na lob?k? oyo ezokaki tii na p?s? 6 (mokolo ya mitano 11 Aprili 2025 ) nsima ya lipaso na yo.   Okoki kotambwisa motuka NSIMA ya botali mpota na yo, longola se soki mopesi na yo [place] yo ndenge mosusu.   [place] na yo ntango nini okoki kozonga na mosala   INCISION/Kolata kolata  SIMBA stimulateur cardiaque na yo ya poste ya Eliquis. OK pona kozongela eliquis na yo le 04/03/23    Soki batyaki PRESSURE DRESSING (pansement ya monene oyo mbala mingi emataka likolo ya lipeka na yo) to batikaki na esika na yango, tosengi olanda malako oyo mopesi na yo apesi mpo na ntango nini osengeli kozonga mpo na kolongola yango.   Kolandela esika ya stimulateur cardiaque na yo soki ezali rouge, kovimba, mpe drainage. [place] clinique ya appareil na [phone] soki omoni bilembo oyo to fivre/malili.   Soki esika na yo ya kokata ekangami na Steri-strips to ba agrafes, okoki kosukola mikolo 7 sima ya procdure na yo to tango mopesi yo [place] yo. Kolongola ba stri-bandes te to [place] douche ebeta directement na site na yo te. Okoki kosukola zingazinga ya esika na yo na sabuni mpe mai.   Soki babimisaki yo na sling, tosengi olata yango na moi te [place] ngonga 48 sima ya lipaso na yo longola se soki bayebisi yo ndenge mosusu. Delfin Gant ekoki kobakisa likama ya kokangama mpe ya mpasi na lipeka na yo.   Bokima ba lotions, ba pommades to ba parfums likolo  ya esika oyo okati tii ekobika malamu.   Okoki kosalela bain  chaud to piscine NSIMA ya rendez-vous na yo ya vrification ya ba mpota soki incision ekangami mobimba.   Ba alertes ya stimulateur cardiaque: Ba alertes mosusu ezalaka vibratoire mpe mosusu ezo bip. Wana ezali makambo ya mbalakaka TE. Tos?ngi bino bbenga biro na biso mpo na koyebisa biso. Soki likambo yango esalemi na butu to na wikende, [employer] tii mokolo ya mosala oyo [place]. Tinda transmission ya mosika.   Soki apar?yi na yo ezali na makoki ya kotnga ezalela ya mai (mpo na insuffisance cardiaque), bakopesa yo bolandi sanza na sanza mpo na kotalela likambo yango elongo na yo.  GESTION YA BA DISPOSITIER  Monitoring  distance esalemaka pona ko suivre stimulateur cardiaque na yo depuis ndaku. Suivi oyo ezali programm chaque 91 jours na  bureau na biso. Ezali kopesa biso nzela ya kotya liso na ndenge oyo apar?yi na yo ezali kosala mpo na koyeba soki ezali kosala malamu. Martie Round na mbala lectrophysiologue na yo mbula na mbula (mbala mingi soki esengeli).   Esengeli ozwa carte d'identit na yo ya appareil na yo ya sika na poso 4-8. [place] carte oyo na yo tango nionso soki ozui yango. [place] kolata bracelet to collier ya alerte mdicale.   Jeanann Lewandowsky ete stimulateur cardiaque na yo ekokani na IRM. Abundio Miu na botali na yo ya sima ya bureau/vrification ya ba mpota. Osengeli koboya kokutana na ba champs lectriques to [employer].  ? Kosalela te bisaleli ya radio Hydrographic surveyor Paramedic) to ba torches lectriques (arc) ya soudure. Esengeli te kosalela ba couteurs Horticulturist, commercial MP3 oyo ezali na ba aimants. Ba apareyi mosusu ezali na likama te mpo na kosalela soki osimbami ata na ntaka ya santim?tr? 30 longwa na stimulateur cardiaque na yo. Yango esangisi bisaleli ya kura, ba masini oyo bakataka matiti mpe ba haut-parleurs. Soki oyebi malamu te soki eloko moko ezali malamu mpo na [place], tun [place] na yo.  ?  Ntango ozali kosalela telefone na yo ya mab?k?, simb yango na litoi oyo ezali na ngmbo mosusu na stimulateur cardiaque. Kotika tlphone portable na yo na poche te likolo ya stimulateur cardiaque.  ? Okoki kosalela ba couvertures lectriques, ba tampons ya chauffage, ba ordinateurs, mpe ba fours micro-ondes sans danger.  [place]eng[place] mbala moko na biro soki: ? Ozali na mpasi na ntolo. ? Harvie Heck mpasi ya mpema koleka ndenge oyokaki liboso. ? Harvie Heck ete ozali na mot ya p?t?? koleka ndenge oyo oyokaki liboso. ? Incision na yo ebandi kofungwama.  Ba sango oyo ezali te mpo na kozwa esika ya toli oyo mosali na yo ya bokolongono apesi yo. Sala makasi osolola mituna nyonso oyo ozali na yango na monganga na yo.

## 2023-03-31 NOTE — Discharge Summary (Incomplete)
     ELECTROPHYSIOLOGY PROCEDURE DISCHARGE SUMMARY    Patient ID: [name] [name],  MRN: [id number], DOB/AGE: 1950/03/20 73 y.o.  Admit date: 03/30/2023 Discharge date: 03/31/2023  Primary Care Physician: Hoy Register, MD  Primary Cardiologist: Verne Carrow, MD  Electrophysiologist: Dr. Jimmey Ralph   Primary Discharge Diagnosis:  SND status post pacemaker implantation this admission  Secondary Discharge Diagnosis:  Tachy-brady syndrome  No Known Allergies   Procedures This Admission:  1.  Implantation of a Abbott Dual Chamber PPM on 03/30/23 by Dr. Jimmey Ralph. The patient received a Abbott Assurity [id number] with a Abbott Ultipace 1231-52 right atrial lead and a Abbott Ultipace 1231-65 right ventricular lead.  There were no immediate post procedure complications.   2.  CXR on 03/31/2023 demonstrated no pneumothorax status post device implantation.       Brief HPI: [name] [name] is a 73 y.o. female was referred to electrophysiology in the outpatient setting for  consideration of PPM implantation.  Past medical history includes above.  The patient has had sinus node dysfunction and tachy-brady syndrome.  Risks, benefits, and alternatives to PPM implantation were reviewed with the patient who wished to proceed.   Hospital Course:  The patient was admitted and underwent implantation of a Abbott dual chamber PPM with details as outlined above.  She was monitored on telemetry overnight which demonstrated NSR.  Left chest was without hematoma or ecchymosis.  The device was interrogated and found to be functioning normally.  CXR was obtained and demonstrated no pneumothorax status post device implantation.  Wound care, arm mobility, and restrictions were reviewed with the patient.  The patient was examined and considered stable for discharge to home.    Anticoagulation resumption This patient should resume their Eliquis on Monday April 03, 2023    Physical  Exam: Vitals:   03/30/23 2359 03/31/23 0000 03/31/23 0400 03/31/23 0501  BP: (!) 141/70   (!) 156/93  Pulse: 80   77  Resp: 20   12  Temp: 98.5 F (36.9 C)   98.3 F (36.8 C)  TempSrc: Oral   Oral  SpO2: 92% 93% 91% 90%  Weight:      Height:        GEN- NAD. A&O x 3.  HEENT: Normocephalic, atraumatic Lungs- CTAB, Normal effort.  Heart- RRR, No M/G/R.  GI- Soft, NT, ND.  Extremities- No clubbing, cyanosis, or edema;  Skin- warm and dry, no rash or lesion, left chest without hematoma/ecchymosis  Discharge Medications:  Allergies as of 03/31/2023   No Known Allergies   Med Rec must be completed prior to using this SMARTLINK***       Disposition:  Home with usual follow up as in AVS   Duration of Discharge Encounter:  APP time: *** minutes  Signed, Graciella Freer, PA-C  03/31/2023 7:09 AM

## 2023-03-31 NOTE — Discharge Summary (Addendum)
 ELECTROPHYSIOLOGY PROCEDURE DISCHARGE SUMMARY    Patient ID: [name] [name],  MRN: [id number], DOB/AGE: 75/19/1952 75 y.o.  Admit date: 03/30/2023 Discharge date: 04/01/2023  Primary Care Physician: Hoy Register, MD  Primary Cardiologist: Verne Carrow, MD  Electrophysiologist: Dr. Jimmey Ralph   Primary Discharge Diagnosis:  Symptomatic bradycardia status post pacemaker implantation this admission  Secondary Discharge Diagnosis:  Paroxysmal AF  NSVT  VT  HTN Mitral Regurgitation  CKD    No Known Allergies   Procedures This Admission:  1.  Implantation of a Abbott Dual Chamber PPM on 03/30/23 by Dr. Jimmey Ralph. The patient received a Abbott Assurity [id number] with a Abbott Ultipace 1231-52 right atrial lead and a Abbott Ultipace 1231-65 right ventricular lead.  There were no immediate post procedure complications.   2.  CXR on 03/31/2023 demonstrated no pneumothorax status post device implantation.       Brief HPI: [name] [name] is a 73 y.o. female was referred to electrophysiology in the outpatient setting for consideration of PPM implantation.  She had intermittent episodes of palpitations, dizziness and lightheadedness for several weeks.  She had multiple pauses noted on her ZIO monitor.  Past medical history includes sinus node dysfunction / tachy-brady syndrome, paroxysmal AF, HTN, CKD, NSVT / VT, mitral regurgitation.  The patient has had sinus node dysfunction & tachy-brady syndrome without reversible causes identified.  Risks, benefits, and alternatives to PPM implantation were reviewed with the patient who wished to proceed.   Hospital Course:  The patient was admitted and underwent implantation of a Abbott dual chamber PPM with details as outlined above.  She was monitored on telemetry overnight which demonstrated appropriate pacing.  Left chest was without hematoma or ecchymosis.  The device was interrogated and found to be functioning normally.   CXR was obtained and demonstrated no pneumothorax status post device implantation.  Wound care, arm mobility, and restrictions were reviewed with the patient.  The patient was examined by Dr. Jimmey Ralph and considered stable for discharge to home.    Anticoagulation resumption This patient should resume their Eliquis on Monday April 03, 2023    Physical Exam: Vitals:   03/31/23 0000 03/31/23 0400 03/31/23 0501 03/31/23 0743  BP:   (!) 156/93 (!) 156/96  Pulse:   77 73  Resp:   12 18  Temp:   98.3 F (36.8 C) 98.2 F (36.8 C)  TempSrc:   Oral Oral  SpO2: 93% 91% 90% 93%  Weight:      Height:        GEN- NAD. A&O x 3.  HEENT: Normocephalic, atraumatic Lungs- CTAB, Normal effort.  Heart- RRR, No M/G/R.  GI- Soft, NT, ND.  Extremities- No clubbing, cyanosis, or edema Skin- warm and dry, no rash or lesion, left chest without hematoma/ecchymosis  Discharge Medications:  Allergies as of 03/31/2023   No Known Allergies      Medication List     TAKE these medications    acetaminophen-codeine 300-30 MG tablet Commonly known as: TYLENOL #3 Take 1 tablet by mouth at bedtime as needed.   amLODipine 5 MG tablet Commonly known as: NORVASC Take 1 tablet (5 mg total) by mouth daily.   apixaban 5 MG Tabs tablet Commonly known as: Eliquis Take 1 tablet (5 mg total) by mouth 2 (two) times daily. Start taking on: April 03, 2023 What changed: These instructions start on April 03, 2023. If you are unsure what to do until then, ask your doctor or other care provider.  Blood Pressure Cuff Misc 1 each by Does not apply route daily.   colchicine 0.6 MG tablet Take 2 tablets by mouth at the onset of a gout flare, repeat 1 tablet in 1 hour if flare continues.   cyclobenzaprine 5 MG tablet Commonly known as: FLEXERIL Take 1 tablet (5 mg total) by mouth 2 (two) times daily as needed for muscle spasms.   lidocaine 5 % Commonly known as: Lidoderm Place 1 patch onto the skin daily. Remove &  Discard patch within 12 hours or as directed by MD   metoprolol succinate 25 MG 24 hr tablet Commonly known as: Toprol XL Take 1 tablet (25 mg total) by mouth daily. Start taking on: April 04, 2023   rosuvastatin 10 MG tablet Commonly known as: CRESTOR Take 1 tablet (10 mg total) by mouth daily. To lower cholesterol        Disposition:  Home with usual follow up as in AVS  Signed, Canary Brim, NP-C, AGACNP-BC [facility] - Electrophysiology  04/01/2023, 5:39 PM  I have seen, examined the patient, and reviewed the above assessment and plan.    Hospital Course: Patient with a history of symptomatic sinus node dysfunction/sinus bradycardia, paroxysmal atrial fibrillation and tachycardia-bradycardia syndrome who presented for scheduled PPM implant on 2/27. She underwent successful dual chamber pacemaker implant with LBBAP lead on 2/27. Monitored overnight with no complications. Doing well and discharged the next morning.   GEN: No acute distress.   Cardiac: Normal rate, regular rhythm. Left chest pocket without hematoma or bleeding. Psych: Normal affect   Plan:  -CXR with appropriate lead position.  -Device check with appropriate function and stable lead parameters.  -Resume Eliquis on March 3rd. In sinus rhythm. -Start metoprolol XL 25mg  once daily.  -Usual post implant discharge teaching regarding wound care and activity restrictions.  Duration of Discharge Encounter: 45 minutes APP time: 20 minutes MD time: 25 minutes  Nobie Putnam, MD 04/01/2023 5:39 PM

## 2023-04-12 ENCOUNTER — Ambulatory Visit: Payer: Self-pay | Attending: Cardiology

## 2023-04-12 ENCOUNTER — Ambulatory Visit: Payer: Self-pay | Admitting: Family Medicine

## 2023-04-12 DIAGNOSIS — I495 Sick sinus syndrome: Secondary | ICD-10-CM

## 2023-04-12 LAB — CUP PACEART INCLINIC DEVICE CHECK
Battery Remaining Longevity: 142 mo
Battery Voltage: 3.07 V
Brady Statistic RA Percent Paced: 12 %
Brady Statistic RV Percent Paced: 1.2 %
Date Time Interrogation Session: 20250312120738
Implantable Lead Connection Status: 753985
Implantable Lead Connection Status: 753985
Implantable Lead Implant Date: 20250227
Implantable Lead Implant Date: 20250227
Implantable Lead Location: 753859
Implantable Lead Location: 753860
Implantable Pulse Generator Implant Date: 20250227
Lead Channel Impedance Value: 462.5 Ohm
Lead Channel Impedance Value: 587.5 Ohm
Lead Channel Pacing Threshold Amplitude: 0.625 V
Lead Channel Pacing Threshold Amplitude: 1 V
Lead Channel Pacing Threshold Amplitude: 1 V
Lead Channel Pacing Threshold Pulse Width: 0.5 ms
Lead Channel Pacing Threshold Pulse Width: 0.5 ms
Lead Channel Pacing Threshold Pulse Width: 0.5 ms
Lead Channel Sensing Intrinsic Amplitude: 10.4 mV
Lead Channel Sensing Intrinsic Amplitude: 4.3 mV
Lead Channel Setting Pacing Amplitude: 0.875
Lead Channel Setting Pacing Amplitude: 3.125
Lead Channel Setting Pacing Pulse Width: 0.5 ms
Lead Channel Setting Sensing Sensitivity: 2 mV
Pulse Gen Model: 2272
Pulse Gen Serial Number: 5996983

## 2023-04-12 NOTE — Progress Notes (Signed)
 Normal Pacemaker wound check. Wound well healed. Thresholds, sensing, and impedances consistent with implant measurements and at 3.5V safety margin/auto capture until 3 month visit. AT/AF burden <1%, appears AT. Reviewed arm restrictions to continue for 6 weeks total post op.  Pt enrolled in remote follow-up.

## 2023-04-12 NOTE — Patient Instructions (Signed)

## 2023-04-17 ENCOUNTER — Ambulatory Visit: Attending: Family Medicine

## 2023-04-17 ENCOUNTER — Other Ambulatory Visit: Payer: Self-pay

## 2023-04-17 ENCOUNTER — Ambulatory Visit: Admitting: Physical Therapy

## 2023-04-17 DIAGNOSIS — M542 Cervicalgia: Secondary | ICD-10-CM | POA: Insufficient documentation

## 2023-04-17 DIAGNOSIS — M5459 Other low back pain: Secondary | ICD-10-CM | POA: Insufficient documentation

## 2023-04-17 DIAGNOSIS — T148XXA Other injury of unspecified body region, initial encounter: Secondary | ICD-10-CM | POA: Diagnosis not present
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
## 2023-04-17 NOTE — Addendum Note (Signed)
 Addended by: Ileana Ladd on: 04/17/2023 01:34 PM   Modules accepted: Orders

## 2023-05-15 ENCOUNTER — Ambulatory Visit (INDEPENDENT_AMBULATORY_CARE_PROVIDER_SITE_OTHER): Payer: Self-pay

## 2023-05-15 DIAGNOSIS — I480 Paroxysmal atrial fibrillation: Secondary | ICD-10-CM

## 2023-05-15 DIAGNOSIS — I495 Sick sinus syndrome: Secondary | ICD-10-CM

## 2023-05-15 LAB — CUP PACEART REMOTE DEVICE CHECK
Battery Remaining Longevity: 136 mo
Battery Remaining Percentage: 95.5 %
Battery Voltage: 3.02 V
Brady Statistic AP VP Percent: 1 %
Brady Statistic AP VS Percent: 11 %
Brady Statistic AS VP Percent: 1.3 %
Brady Statistic AS VS Percent: 87 %
Brady Statistic RA Percent Paced: 10 %
Brady Statistic RV Percent Paced: 1.6 %
Date Time Interrogation Session: 20250414020021
Implantable Lead Connection Status: 753985
Implantable Lead Connection Status: 753985
Implantable Lead Implant Date: 20250227
Implantable Lead Implant Date: 20250227
Implantable Lead Location: 753859
Implantable Lead Location: 753860
Implantable Pulse Generator Implant Date: 20250227
Lead Channel Impedance Value: 460 Ohm
Lead Channel Impedance Value: 530 Ohm
Lead Channel Pacing Threshold Amplitude: 0.75 V
Lead Channel Pacing Threshold Amplitude: 0.875 V
Lead Channel Pacing Threshold Pulse Width: 0.5 ms
Lead Channel Pacing Threshold Pulse Width: 0.5 ms
Lead Channel Sensing Intrinsic Amplitude: 12 mV
Lead Channel Sensing Intrinsic Amplitude: 5 mV
Lead Channel Setting Pacing Amplitude: 1 V
Lead Channel Setting Pacing Amplitude: 1.875
Lead Channel Setting Pacing Pulse Width: 0.5 ms
Lead Channel Setting Sensing Sensitivity: 2 mV
Pulse Gen Model: 2272
Pulse Gen Serial Number: 5996983

## 2023-06-01 ENCOUNTER — Ambulatory Visit: Payer: Self-pay | Admitting: Family Medicine

## 2023-06-02 ENCOUNTER — Other Ambulatory Visit: Payer: Self-pay

## 2023-06-14 ENCOUNTER — Encounter: Payer: Self-pay | Admitting: Student

## 2023-07-03 NOTE — Progress Notes (Signed)
  Electrophysiology Office Note:   ID:  [name] [name], DOB 1950-06-27, MRN [id number]  Primary Cardiologist: Antoinette Batman, MD Electrophysiologist: Ardeen Kohler, MD      History of Present Illness:   [name] [name] is a 73 y.o. female with h/o SND and tachy-brady s/p PPM seen today for routine electrophysiology follow-up s/p Pacemaker implant.  Since last being seen in our clinic the patient reports doing well. Mild tenderness over her implant site since surgery. Otherwise,  she denies chest pain, palpitations, dyspnea, PND, orthopnea, nausea, vomiting, dizziness, syncope, edema, weight gain, or early satiety.    Review of systems complete and found to be negative unless listed in HPI.   EP Information / Studies Reviewed:    EKG is ordered today. Personal review as below.       PPM Interrogation-  reviewed in detail today,  See PACEART report.  Arrhythmia/Device History Abbott Dual Chamber PPM 03/30/2023 for Tachy/brady / SND    Physical Exam:   VS:  LMP  (LMP Unknown)    Wt Readings from Last 3 Encounters:  03/30/23 199 lb 8.3 oz (90.5 kg)  03/14/23 214 lb 8.1 oz (97.3 kg)  03/07/23 212 lb 6.4 oz (96.3 kg)     GEN: No acute distress  NECK: No JVD; No carotid bruits CARDIAC: Regular rate and rhythm, no murmurs, rubs, gallops RESPIRATORY:  Clear to auscultation without rales, wheezing or rhonchi  ABDOMEN: Soft, non-tender, non-distended EXTREMITIES:  No edema; No deformity   ASSESSMENT AND PLAN:    Tachy-Brady syndrome s/p Abbott PPM  Normal PPM function See Pace Art report No changes today  Paroxysmal AF Continue eliquis  5 mg BID for CHA2DS2/VASc of at least 5 Burden <1%  Secondary hypercoagulable state Pt on Eliquis  as above    HTN Stable on current regimen    Disposition:   Follow up with EP Team in 12 months  Signed, Tylene Galla, PA-C

## 2023-07-04 ENCOUNTER — Ambulatory Visit: Payer: Self-pay | Attending: Student | Admitting: Student

## 2023-07-04 ENCOUNTER — Telehealth: Payer: Self-pay | Admitting: Licensed Clinical Social Worker

## 2023-07-04 ENCOUNTER — Encounter: Payer: Self-pay | Admitting: Student

## 2023-07-04 VITALS — BP 130/90 | HR 65 | Ht 63.0 in | Wt 200.4 lb

## 2023-07-04 DIAGNOSIS — I480 Paroxysmal atrial fibrillation: Secondary | ICD-10-CM

## 2023-07-04 DIAGNOSIS — D6869 Other thrombophilia: Secondary | ICD-10-CM

## 2023-07-04 DIAGNOSIS — I495 Sick sinus syndrome: Secondary | ICD-10-CM

## 2023-07-04 DIAGNOSIS — I10 Essential (primary) hypertension: Secondary | ICD-10-CM

## 2023-07-04 LAB — CUP PACEART INCLINIC DEVICE CHECK
Battery Remaining Longevity: 140 mo
Battery Voltage: 3.01 V
Brady Statistic RA Percent Paced: 4.3 %
Brady Statistic RV Percent Paced: 1.9 %
Date Time Interrogation Session: 20250603125154
Implantable Lead Connection Status: 753985
Implantable Lead Connection Status: 753985
Implantable Lead Implant Date: 20250227
Implantable Lead Implant Date: 20250227
Implantable Lead Location: 753859
Implantable Lead Location: 753860
Implantable Pulse Generator Implant Date: 20250227
Lead Channel Impedance Value: 462.5 Ohm
Lead Channel Impedance Value: 562.5 Ohm
Lead Channel Pacing Threshold Amplitude: 0.75 V
Lead Channel Pacing Threshold Amplitude: 0.75 V
Lead Channel Pacing Threshold Pulse Width: 0.5 ms
Lead Channel Pacing Threshold Pulse Width: 0.5 ms
Lead Channel Sensing Intrinsic Amplitude: 12 mV
Lead Channel Sensing Intrinsic Amplitude: 4.7 mV
Lead Channel Setting Pacing Amplitude: 1 V
Lead Channel Setting Pacing Amplitude: 1.75 V
Lead Channel Setting Pacing Pulse Width: 0.5 ms
Lead Channel Setting Sensing Sensitivity: 2 mV
Pulse Gen Model: 2272
Pulse Gen Serial Number: 5996983

## 2023-07-04 NOTE — Telephone Encounter (Signed)
 H&V Care Navigation CSW Progress Note  Clinical Social Worker contacted financial counseling to refer large accounts outstanding from recent hospital stays for Emergency Medicaid consideration. Emailed Joaine, Personnel officer securely for any options.  Patient is participating in a Managed Medicaid Plan:  No, self pay only  SDOH Screenings   Food Insecurity: No Food Insecurity (03/30/2023)  Housing: Unknown (03/30/2023)  Transportation Needs: No Transportation Needs (03/30/2023)  Utilities: Not At Risk (03/30/2023)  Depression (PHQ2-9): Low Risk  (02/23/2023)  Social Connections: Unknown (03/30/2023)  Tobacco Use: Low Risk  (04/17/2023)    Nathen Balder, MSW, LCSW Clinical Social Worker II [facility] Heart/Vascular Care Navigation  [phone]- work cell phone (preferred)

## 2023-07-04 NOTE — Patient Instructions (Signed)
 Medication Instructions:  No medication changes today. *If you need a refill on your cardiac medications before your next appointment, please call your pharmacy*  Lab Work: No labwork ordered today. If you have labs (blood work) drawn today and your tests are completely normal, you will receive your results only by: MyChart Message (if you have MyChart) OR A paper copy in the mail If you have any lab test that is abnormal or we need to change your treatment, we will call you to review the results.  Testing/Procedures: No testing ordered today  Follow-Up: At [facility], you and your health needs are our priority.  As part of our continuing mission to provide you with exceptional heart care, our providers are all part of one team.  This team includes your primary Cardiologist (physician) and Advanced Practice Providers or APPs (Physician Assistants and Nurse Practitioners) who all work together to provide you with the care you need, when you need it.  Your next appointment:   12 month(s)  Provider:   You may see Ardeen Kohler, MD or one of the following Advanced Practice Providers on your designated Care Team:   Mertha Abrahams, Kennard Pea "Jonelle Neri" [place], PA-C Suzann Riddle, NP Creighton Doffing, NP    We recommend signing up for the patient portal called "MyChart".  Sign up information is provided on this After Visit Summary.  MyChart is used to connect with patients for Virtual Visits (Telemedicine).  Patients are able to view lab/test results, encounter notes, upcoming appointments, etc.  Non-urgent messages can be sent to your provider as well.   To learn more about what you can do with MyChart, go to [email or web address].

## 2023-07-04 NOTE — Progress Notes (Signed)
 Remote pacemaker transmission.

## 2023-07-10 ENCOUNTER — Ambulatory Visit: Payer: Self-pay | Admitting: Cardiology

## 2023-07-18 NOTE — Congregational Nurse Program (Signed)
  Dept: [phone]   Congregational Nurse Program Note  Date of Encounter: 07/18/2023  Past Medical History: Past Medical History:  Diagnosis Date   Acute kidney injury (HCC) 03/02/2019   Acute respiratory failure with hypoxia (HCC) 02/22/2019   Atrial fibrillation with RVR (HCC) 02/26/2019   COVID-19 02/22/2019   Dysrhythmia    para. atrial fibrillation   Flu-like symptoms 02/22/2019   Hypertension    Knee osteoarthritis 10/24/2017   Pneumonia     Encounter Details:  Community Questionnaire - 07/18/23 1635       Questionnaire   Ask client: Do you give verbal consent for me to treat you today? Yes    Student Assistance N/A    Location Patient Served  [facility]    Encounter Setting CN site    Population Status Migrant/Refugee    Engineer, building services or [place] Insurance    Insurance/Financial Assistance Referral N/A    Medication Have Medication Insecurities    Medical Provider Yes    Screening Referrals Made N/A    Medical Referrals Made N/A    Medical Appointment Completed [facility] P[facility]inic;[facility] PCP/Clinic;Vaccination    CNP Interventions Advocate/Support;Navigate Healthcare System;Counsel;Educate;Case Management    Screenings CN Performed Blood Pressure    ED Visit Averted Yes    Life-Saving Intervention Made N/A        Patient came to see Congregational Nurse for routine blood pressure check. Has recent history of Pacemaker placement. Incision looks healed with no signs of infection. She reports to be compliant with medication.   [name] Bradford Tayshon Winker RN BSN PCCN  [facility] [phone]-cell [phone]-office

## 2023-07-25 ENCOUNTER — Ambulatory Visit: Payer: Self-pay | Attending: Family Medicine | Admitting: Family Medicine

## 2023-07-25 ENCOUNTER — Encounter: Payer: Self-pay | Admitting: Family Medicine

## 2023-07-25 ENCOUNTER — Other Ambulatory Visit: Payer: Self-pay

## 2023-07-25 VITALS — BP 120/71 | HR 72 | Ht 63.0 in | Wt 198.4 lb

## 2023-07-25 DIAGNOSIS — R519 Headache, unspecified: Secondary | ICD-10-CM

## 2023-07-25 DIAGNOSIS — E119 Type 2 diabetes mellitus without complications: Secondary | ICD-10-CM

## 2023-07-25 DIAGNOSIS — I495 Sick sinus syndrome: Secondary | ICD-10-CM

## 2023-07-25 DIAGNOSIS — Z6835 Body mass index (BMI) 35.0-35.9, adult: Secondary | ICD-10-CM

## 2023-07-25 DIAGNOSIS — E1169 Type 2 diabetes mellitus with other specified complication: Secondary | ICD-10-CM

## 2023-07-25 DIAGNOSIS — Z634 Disappearance and death of family member: Secondary | ICD-10-CM

## 2023-07-25 DIAGNOSIS — E669 Obesity, unspecified: Secondary | ICD-10-CM

## 2023-07-25 DIAGNOSIS — I10 Essential (primary) hypertension: Secondary | ICD-10-CM

## 2023-07-25 DIAGNOSIS — M1000 Idiopathic gout, unspecified site: Secondary | ICD-10-CM

## 2023-07-25 DIAGNOSIS — I480 Paroxysmal atrial fibrillation: Secondary | ICD-10-CM

## 2023-07-25 LAB — POCT GLYCOSYLATED HEMOGLOBIN (HGB A1C): HbA1c, POC (controlled diabetic range): 5.6 % (ref 0.0–7.0)

## 2023-07-25 MED ORDER — HYDROXYZINE HCL 25 MG PO TABS
25.0000 mg | ORAL_TABLET | Freq: Every evening | ORAL | 1 refills | Status: AC | PRN
Start: 2023-07-25 — End: ?
  Filled 2023-07-25 – 2023-09-06 (×3): qty 60, 60d supply, fill #0
  Filled 2023-11-21 (×2): qty 60, 60d supply, fill #1

## 2023-07-25 MED ORDER — BLOOD PRESSURE CUFF MISC
1.0000 | Freq: Every day | 0 refills | Status: AC
  Filled 2023-07-25: qty 1, fill #0

## 2023-07-25 MED ORDER — AMLODIPINE BESYLATE 5 MG PO TABS
5.0000 mg | ORAL_TABLET | Freq: Every day | ORAL | 1 refills | Status: DC
  Filled 2023-07-25 – 2023-09-06 (×2): qty 90, 90d supply, fill #0
  Filled 2023-12-12: qty 90, 90d supply, fill #1

## 2023-07-25 NOTE — Patient Instructions (Signed)
 VISIT SUMMARY:  Today, we discussed your nighttime headaches, which occur two to four times a week and started after your husband's passing. We also reviewed your ongoing conditions, including atrial fibrillation, type 2 diabetes, hypertension, and gout. Your caregiver was present to assist with your care.  YOUR PLAN:  -HEADACHES: Your headaches are likely due to stress, anxiety, and dehydration. To help with these, you have been prescribed hydroxyzine to take one hour before bed daily for one month. This medication should help with both anxiety and sleep, which may reduce your headaches. We will evaluate your progress after one month.  -ATRIAL FIBRILLATION WITH RAPID VENTRICULAR RESPONSE: Atrial fibrillation is a heart condition that causes an irregular and often rapid heart rate. You are managing this with a pacemaker and the medication apixaban . Please continue taking apixaban  as prescribed and coordinate with your cardiologist for refills.  -TYPE 2 DIABETES MELLITUS: Type 2 diabetes is a condition that affects the way your body processes blood sugar. Your diabetes is well controlled with an A1c of 5.6%, which is excellent.  -HYPERTENSION: Hypertension, or high blood pressure, is well controlled with your current medication regimen. Keep taking your medications as prescribed.  -GOUT: Gout is a form of arthritis characterized by severe pain, redness, and tenderness in joints. Continue taking your medication as needed for flare-ups.  -GENERAL HEALTH MAINTENANCE: You are coping well with your husband's loss and have a supportive community. You live with and care for two others. We will have a routine follow-up in six months, but please come in earlier if needed.  INSTRUCTIONS:  Please follow up in one month to evaluate the improvement of your headaches. Continue coordinating with your cardiologist for your heart condition and come in for a routine follow-up in six months, or earlier if needed.

## 2023-07-25 NOTE — Progress Notes (Signed)
 Subjective:  Patient ID: [name] [name], female    DOB: 11/26/1950  Age: 73 y.o. MRN: [id number]  CC: Medical Management of Chronic Issues (Headaches at night)     Discussed the use of AI scribe software for clinical note transcription with the patient, who gave verbal consent to proceed.  History of Present Illness [name] [name] is a 73 year old female who presents with  with a history of Type 2 DM (diet controlled ), Hypertension,  A.fib with RVR, sinus node dysfunction status post pacemaker placement, history of b/l TKA, Gout. nighttime headaches. She is accompanied by a caregiver who helps with her care.  She experiences headaches primarily at night, occurring approximately two to four times per week. These began after her husband's death. She uses over-the-counter medications like ibuprofen or naproxen, taking two tablets in the evening as needed. Despite the headaches, she generally sleeps well but sometimes wakes up after about two hours.  She has a pacemaker and takes Eliquis  for her A-fib her cardiologist manages these aspects of her care.  Last visit with the cardiology PA was 2 weeks ago.  She lives with two other individuals and receives support from her church and school communities. Her caregiver visits three to four times a week. Since her husband's passing, she has removed his ashes from the house to aid her emotional adjustment. Denies presence of gout flares.   Past Medical History:  Diagnosis Date   Acute kidney injury (HCC) 03/02/2019   Acute respiratory failure with hypoxia (HCC) 02/22/2019   Atrial fibrillation with RVR (HCC) 02/26/2019   COVID-19 02/22/2019   Dysrhythmia    para. atrial fibrillation   Flu-like symptoms 02/22/2019   Hypertension    Knee osteoarthritis 10/24/2017   Pneumonia     Past Surgical History:  Procedure Laterality Date   NO PAST SURGERIES     PACEMAKER IMPLANT N/A 03/30/2023   Procedure: PACEMAKER IMPLANT;  Surgeon:  Kennyth Chew, MD;  Location: [facility] INVASIVE CV LAB;  Service: Cardiovascular;  Laterality: N/A;   TOTAL KNEE ARTHROPLASTY Right 12/03/2019   Procedure: RIGHT TOTAL KNEE ARTHROPLASTY;  Surgeon: Addie Cordella Hamilton, MD;  Location: [facility] OR;  Service: Orthopedics;  Laterality: Right;   TOTAL KNEE ARTHROPLASTY Left 07/07/2020   Procedure: LEFT TOTAL KNEE ARTHROPLASTY-CEMENTED;  Surgeon: Addie Cordella Hamilton, MD;  Location: [facility] OR;  Service: Orthopedics;  Laterality: Left;    Family History  Problem Relation Age of Onset   Breast cancer Neg Hx     Social History   Socioeconomic History   Marital status: Married    Spouse name: Not on file   Number of children: Not on file   Years of education: Not on file   Highest education level: Not on file  Occupational History   Not on file  Tobacco Use   Smoking status: Never   Smokeless tobacco: Never  Vaping Use   Vaping status: Never Used  Substance and Sexual Activity   Alcohol use: Never   Drug use: Never   Sexual activity: Not Currently    Birth control/protection: Post-menopausal  Other Topics Concern   Not on file  Social History Narrative   Not on file   Social Drivers of Health   Financial Resource Strain: Not on file  Food Insecurity: No Food Insecurity (03/30/2023)   Hunger Vital Sign    Worried About Running Out of Food in the Last Year: Never true    Ran Out of Food in the Last  Year: Never true  Transportation Needs: No Transportation Needs (03/30/2023)   PRAPARE - Administrator, Civil Service (Medical): No    Lack of Transportation (Non-Medical): No  Physical Activity: Not on file  Stress: Not on file  Social Connections: Unknown (03/30/2023)   Social Connection and Isolation Panel    Frequency of Communication with Friends and Family: Once a week    Frequency of Social Gatherings with Friends and Family: Once a week    Attends Religious Services: 1 to 4 times per year    Active Member of [employer] or Organizations:  No    Attends Engineer, structural: 1 to 4 times per year    Marital Status: Not on file    No Known Allergies  Outpatient Medications Prior to Visit  Medication Sig Dispense Refill   apixaban  (ELIQUIS ) 5 MG TABS tablet Take 1 tablet (5 mg total) by mouth 2 (two) times daily.     colchicine  0.6 MG tablet Take 2 tablets by mouth at the onset of a gout flare, repeat 1 tablet in 1 hour if flare continues. 90 tablet 1   cyclobenzaprine  (FLEXERIL ) 5 MG tablet Take 1 tablet (5 mg total) by mouth 2 (two) times daily as needed for muscle spasms. 60 tablet 1   metoprolol  succinate (TOPROL  XL) 25 MG 24 hr tablet Take 1 tablet (25 mg total) by mouth daily. 30 tablet 11   rosuvastatin  (CRESTOR ) 10 MG tablet Take 1 tablet (10 mg total) by mouth daily. To lower cholesterol 90 tablet 1   amLODipine  (NORVASC ) 5 MG tablet Take 1 tablet (5 mg total) by mouth daily. 90 tablet 1   Blood Pressure Monitoring (BLOOD PRESSURE CUFF) MISC 1 each by Does not apply route daily. 1 each 0   No facility-administered medications prior to visit.     ROS Review of Systems  Constitutional:  Negative for activity change and appetite change.  HENT:  Negative for sinus pressure and sore throat.   Respiratory:  Negative for chest tightness, shortness of breath and wheezing.   Cardiovascular:  Negative for chest pain and palpitations.  Gastrointestinal:  Negative for abdominal distention, abdominal pain and constipation.  Genitourinary: Negative.   Musculoskeletal: Negative.   Neurological:  Positive for headaches.  Psychiatric/Behavioral:  Negative for behavioral problems and dysphoric mood.     Objective:  BP 120/71   Pulse 72   Ht 5' 3 (1.6 m)   Wt 198 lb 6.4 oz (90 kg)   LMP  (LMP Unknown)   SpO2 98%   BMI 35.14 kg/m      07/25/2023    2:52 PM 07/18/2023   12:00 PM 07/04/2023   12:20 PM  BP/Weight  Systolic BP 120 143 130  Diastolic BP 71 77 90  Wt. (Lbs) 198.4 198.41 200.4  BMI 35.14 kg/m2  35.15 kg/m2 35.5 kg/m2      Physical Exam Constitutional:      Appearance: She is well-developed.   Cardiovascular:     Rate and Rhythm: Normal rate.     Heart sounds: Normal heart sounds. No murmur heard. Pulmonary:     Effort: Pulmonary effort is normal.     Breath sounds: Normal breath sounds. No wheezing or rales.  Chest:     Chest wall: No tenderness.  Abdominal:     General: Bowel sounds are normal. There is no distension.     Palpations: Abdomen is soft. There is no mass.     Tenderness: There  is no abdominal tenderness.   Musculoskeletal:        General: Normal range of motion.     Right lower leg: No edema.     Left lower leg: No edema.   Neurological:     Mental Status: She is alert and oriented to person, place, and time.   Psychiatric:        Mood and Affect: Mood normal.        Latest Ref Rng & Units 03/30/2023   10:30 AM 02/26/2023    8:11 PM 02/26/2023    7:50 PM  CMP  Glucose 70 - 99 mg/dL 75  85  90   BUN 8 - 23 mg/dL 10  14  16    Creatinine 0.44 - 1.00 mg/dL 9.06  8.79  8.87   Sodium 135 - 145 mmol/L 140  144  142   Potassium 3.5 - 5.1 mmol/L 3.9  3.6  3.6   Chloride 98 - 111 mmol/L 106  108  107   CO2 22 - 32 mmol/L 24   23   Calcium  8.9 - 10.3 mg/dL 8.5   9.2   Total Protein 6.5 - 8.1 g/dL   8.3   Total Bilirubin 0.0 - 1.2 mg/dL   0.6   Alkaline Phos 38 - 126 U/L   73   AST 15 - 41 U/L   44   ALT 0 - 44 U/L   47     Lipid Panel     Component Value Date/Time   CHOL 139 06/01/2021 0953   TRIG 58 06/01/2021 0953   HDL 58 06/01/2021 0953   CHOLHDL 4.7 (H) 08/08/2018 1145   LDLCALC 69 06/01/2021 0953    CBC    Component Value Date/Time   WBC 4.8 03/30/2023 1030   RBC 4.61 03/30/2023 1030   HGB 11.2 (L) 03/30/2023 1030   HGB 13.4 02/23/2023 1542   HCT 35.0 (L) 03/30/2023 1030   HCT 43.4 02/23/2023 1542   PLT 74 (L) 03/30/2023 1030   PLT 96 (LL) 02/23/2023 1542   MCV 75.9 (L) 03/30/2023 1030   MCV 81 02/23/2023 1542   MCH 24.3  (L) 03/30/2023 1030   MCHC 32.0 03/30/2023 1030   RDW 18.4 (H) 03/30/2023 1030   RDW 18.6 (H) 02/23/2023 1542   LYMPHSABS 1.9 02/26/2023 1950   LYMPHSABS 2.5 02/23/2023 1542   MONOABS 0.8 02/26/2023 1950   EOSABS 0.0 02/26/2023 1950   EOSABS 0.1 02/23/2023 1542   BASOSABS 0.0 02/26/2023 1950   BASOSABS 0.0 02/23/2023 1542    Lab Results  Component Value Date   HGBA1C 5.6 07/25/2023      1. Type 2 diabetes mellitus with obesity (HCC) (Primary) Diet controlled with A1c of 5.6 - POCT glycosylated hemoglobin (Hb A1C) - Microalbumin/Creatinine Ratio, Urine  2. Essential hypertension Controlled Counseled on blood pressure goal of less than 130/80, low-sodium, DASH diet, medication compliance, 150 minutes of moderate intensity exercise per week. Discussed medication compliance, adverse effects. - amLODipine  (NORVASC ) 5 MG tablet; Take 1 tablet (5 mg total) by mouth daily.  Dispense: 90 tablet; Refill: 1 - Blood Pressure Monitoring (BLOOD PRESSURE CUFF) MISC; 1 each by Does not apply route daily.  Dispense: 1 each; Refill: 0  3. Bereavement She has a good support at home - hydrOXYzine (ATARAX) 25 MG tablet; Take 1 tablet (25 mg total) by mouth at bedtime as needed.  Dispense: 60 tablet; Refill: 1  4. Nonintractable headache, unspecified chronicity pattern, unspecified headache type  Symptoms have been present since her bereavement She uses ibuprofen as needed Prescribed hydroxyzine which will also help with her sleep and hopefully bring about improvement  5. Sinus node dysfunction (HCC) Status post pacemaker Continue to follow-up with cardiology  6. Idiopathic gout, unspecified chronicity, unspecified site Stable with no flares Colchicine  as needed  7. Paroxysmal atrial fibrillation (HCC) Currently in sinus rhythm Continue anticoagulation with Eliquis  and rate control with beta-blocker  Meds ordered this encounter  Medications   amLODipine  (NORVASC ) 5 MG tablet    Sig:  Take 1 tablet (5 mg total) by mouth daily.    Dispense:  90 tablet    Refill:  1   Blood Pressure Monitoring (BLOOD PRESSURE CUFF) MISC    Sig: 1 each by Does not apply route daily.    Dispense:  1 each    Refill:  0    Please show patient how to use.   hydrOXYzine (ATARAX) 25 MG tablet    Sig: Take 1 tablet (25 mg total) by mouth at bedtime as needed.    Dispense:  60 tablet    Refill:  1    Follow-up: Return in about 6 months (around 01/24/2024) for Chronic medical conditions.       Corrina Sabin, MD, FAAFP. [facility] [place], [place] [phone]   07/25/2023, 5:22 PM

## 2023-07-26 ENCOUNTER — Ambulatory Visit: Payer: Self-pay | Admitting: Family Medicine

## 2023-07-26 LAB — MICROALBUMIN / CREATININE URINE RATIO
Creatinine, Urine: 213.9 mg/dL
Microalb/Creat Ratio: 4 mg/g{creat} (ref 0–29)
Microalbumin, Urine: 8 ug/mL

## 2023-07-27 ENCOUNTER — Other Ambulatory Visit: Payer: Self-pay

## 2023-08-09 NOTE — Progress Notes (Signed)
   08/09/23 1300  Spiritual Encounters  Type of Visit Initial  Care provided to: Patient  Referral source Chaplain assessment  Reason for visit Routine spiritual support  OnCall Visit No  Spiritual Framework  Presenting Themes Significant life change;Community and relationships  Community/Connection Family  Patient Stress Factors Financial concerns  Interventions  Spiritual Care Interventions Made Established relationship of care and support;Compassionate presence;Reflective listening;Narrative/life review  Intervention Outcomes  Outcomes Connection to spiritual care;Awareness around self/spiritual resourses;Connection to values and goals of care;Awareness of support    Chaplain responded to consult request for support. Takyia shared that her husband passed away recently. She lives with her husband's family who are her husband's close friends. She stated that after since her husband passed away, she has been struggling financially. She doesn't work and there is no one to provide financial support. She requests some assistance on that.  Chaplain listened to her concerns and conveyed the request to Congregational Nurse in [facility].

## 2023-08-09 NOTE — Congregational Nurse Program (Signed)
  Dept: [phone]   Congregational Nurse Program Note  Date of Encounter: 08/09/2023  Past Medical History: Past Medical History:  Diagnosis Date   Acute kidney injury (HCC) 03/02/2019   Acute respiratory failure with hypoxia (HCC) 02/22/2019   Atrial fibrillation with RVR (HCC) 02/26/2019   COVID-19 02/22/2019   Dysrhythmia    para. atrial fibrillation   Flu-like symptoms 02/22/2019   Hypertension    Knee osteoarthritis 10/24/2017   Pneumonia     Encounter Details:  Community Questionnaire - 08/09/23 1133       Questionnaire   Ask client: Do you give verbal consent for me to treat you today? Yes    Chiropractor    Location Patient Served  [facility]    Encounter Setting CN site    Population Status Migrant/Refugee    Engineer, building services or [place] [employer]    Insurance/Financial Assistance Referral N/A    Medication Have Medication Insecurities    Medical Provider Yes    Screening Referrals Made N/A    Medical Referrals Made N/A    Medical Appointment Completed N/A    CNP Interventions Advocate/Support;Navigate Healthcare System;Counsel;Educate;Case Management    Screenings CN Performed Blood Pressure    ED Visit Averted N/A    Life-Saving Intervention Made N/A        Patient is going through a stressful situation after the death of her husband. Patient would like to talk with the chaplain. Chaplain will meet with her briefly to establish care.  Naomie Jhoel Stieg RN BSN PCCN  [facility] [phone]-cell [phone]-office

## 2023-08-14 ENCOUNTER — Ambulatory Visit: Payer: Self-pay

## 2023-08-14 DIAGNOSIS — I495 Sick sinus syndrome: Secondary | ICD-10-CM

## 2023-08-14 LAB — CUP PACEART REMOTE DEVICE CHECK
Battery Remaining Longevity: 136 mo
Battery Remaining Percentage: 95.5 %
Battery Voltage: 3.01 V
Brady Statistic AP VP Percent: 1 %
Brady Statistic AP VS Percent: 3.2 %
Brady Statistic AS VP Percent: 2.5 %
Brady Statistic AS VS Percent: 93 %
Brady Statistic RA Percent Paced: 2.6 %
Brady Statistic RV Percent Paced: 2.7 %
Date Time Interrogation Session: 20250714020013
Implantable Lead Connection Status: 753985
Implantable Lead Connection Status: 753985
Implantable Lead Implant Date: 20250227
Implantable Lead Implant Date: 20250227
Implantable Lead Location: 753859
Implantable Lead Location: 753860
Implantable Pulse Generator Implant Date: 20250227
Lead Channel Impedance Value: 410 Ohm
Lead Channel Impedance Value: 530 Ohm
Lead Channel Pacing Threshold Amplitude: 0.375 V
Lead Channel Pacing Threshold Amplitude: 0.625 V
Lead Channel Pacing Threshold Pulse Width: 0.5 ms
Lead Channel Pacing Threshold Pulse Width: 0.5 ms
Lead Channel Sensing Intrinsic Amplitude: 12 mV
Lead Channel Sensing Intrinsic Amplitude: 4.1 mV
Lead Channel Setting Pacing Amplitude: 0.625
Lead Channel Setting Pacing Amplitude: 1.625
Lead Channel Setting Pacing Pulse Width: 0.5 ms
Lead Channel Setting Sensing Sensitivity: 2 mV
Pulse Gen Model: 2272
Pulse Gen Serial Number: 5996983

## 2023-08-16 NOTE — Congregational Nurse Program (Signed)
  Dept: [phone]   Congregational Nurse Program Note  Date of Encounter: 08/16/2023  Past Medical History: Past Medical History:  Diagnosis Date   Acute kidney injury (HCC) 03/02/2019   Acute respiratory failure with hypoxia (HCC) 02/22/2019   Atrial fibrillation with RVR (HCC) 02/26/2019   COVID-19 02/22/2019   Dysrhythmia    para. atrial fibrillation   Flu-like symptoms 02/22/2019   Hypertension    Knee osteoarthritis 10/24/2017   Pneumonia     Encounter Details:  Community Questionnaire - 08/16/23 1451       Questionnaire   Ask client: Do you give verbal consent for me to treat you today? Yes    Student Assistance N/A    Location Patient Served  [facility]    Encounter Setting CN site    Population Status Migrant/Refugee    Engineer, building services or [place] [employer]    Insurance/Financial Assistance Referral N/A    Medication Have Medication Insecurities    Medical Provider Yes    Screening Referrals Made N/A    Medical Referrals Made N/A    Medical Appointment Completed N/A    CNP Interventions Advocate/Support;Navigate Healthcare System;Counsel;Educate;Case Management    Screenings CN Performed Blood Pressure    ED Visit Averted N/A    Life-Saving Intervention Made N/A         Patient came in for routine BP check. Health education provided regarding low salt heart healthy diet.   Naomie Kambree Krauss RN BSN PCCN  [facility] [phone] [phone]-office

## 2023-08-20 ENCOUNTER — Ambulatory Visit: Payer: Self-pay | Admitting: Cardiology

## 2023-08-28 ENCOUNTER — Other Ambulatory Visit: Payer: Self-pay

## 2023-09-06 ENCOUNTER — Other Ambulatory Visit: Payer: Self-pay | Admitting: Family Medicine

## 2023-09-06 ENCOUNTER — Other Ambulatory Visit: Payer: Self-pay

## 2023-09-06 DIAGNOSIS — I10 Essential (primary) hypertension: Secondary | ICD-10-CM

## 2023-09-06 DIAGNOSIS — E7800 Pure hypercholesterolemia, unspecified: Secondary | ICD-10-CM

## 2023-09-06 MED ORDER — ROSUVASTATIN CALCIUM 10 MG PO TABS
10.0000 mg | ORAL_TABLET | Freq: Every day | ORAL | 1 refills | Status: DC
  Filled 2023-09-06: qty 90, 90d supply, fill #0
  Filled 2023-12-12: qty 90, 90d supply, fill #1

## 2023-09-07 ENCOUNTER — Other Ambulatory Visit: Payer: Self-pay

## 2023-09-08 ENCOUNTER — Other Ambulatory Visit: Payer: Self-pay

## 2023-09-12 ENCOUNTER — Encounter: Payer: Self-pay | Admitting: *Deleted

## 2023-09-12 ENCOUNTER — Other Ambulatory Visit: Payer: Self-pay | Admitting: *Deleted

## 2023-09-12 ENCOUNTER — Other Ambulatory Visit: Payer: Self-pay

## 2023-09-12 ENCOUNTER — Other Ambulatory Visit (HOSPITAL_COMMUNITY): Payer: Self-pay

## 2023-09-12 NOTE — Congregational Nurse Program (Signed)
  Dept: [phone]   Congregational Nurse Program Note  Date of Encounter: 09/12/2023  Past Medical History: Past Medical History:  Diagnosis Date   Acute kidney injury (HCC) 03/02/2019   Acute respiratory failure with hypoxia (HCC) 02/22/2019   Atrial fibrillation with RVR (HCC) 02/26/2019   COVID-19 02/22/2019   Dysrhythmia    para. atrial fibrillation   Flu-like symptoms 02/22/2019   Hypertension    Knee osteoarthritis 10/24/2017   Pneumonia     Encounter Details:  Community Questionnaire - 09/12/23 1151       Questionnaire   Ask client: Do you give verbal consent for me to treat you today? Yes    Student Assistance N/A    Location Patient Served  [facility]    Encounter Setting CN site    Population Status Migrant/Refugee    Insurance Unknown    Insurance/Financial Assistance Referral N/A    Medication Have Medication Insecurities    Medical Provider Yes    Screening Referrals Made N/A    Medical Referrals Made N/A    Medical Appointment Completed N/A    CNP Interventions Advocate/Support;Navigate Healthcare System;Counsel;Educate;Case Management    Screenings CN Performed Blood Pressure;Weight    ED Visit Averted Yes    Life-Saving Intervention Made N/A         client came into [facility] nurse office.  She says she needs refills on her BP medication as well as the medication for cramps.  She also mentioned needing a small pink pill.  I contacted Dr. Millard office and I spoke with the [place] outpatient pharmacy.  Four medications are to be shipped to her home today.  Client says she was out of the BP medication for the past 5 days and feels a little off.  I used an interpreter who speaks [place] to communicate all of this information to the client. Will follow up as requested.  Lugene Ropes, RN, MSN, CNP [phone] Office [phone] Cell

## 2023-09-13 ENCOUNTER — Encounter: Payer: Self-pay | Admitting: *Deleted

## 2023-09-13 NOTE — Congregational Nurse Program (Signed)
  Dept: [phone]   Congregational Nurse Program Note  Date of Encounter: 09/13/2023  Past Medical History: Past Medical History:  Diagnosis Date   Acute kidney injury (HCC) 03/02/2019   Acute respiratory failure with hypoxia (HCC) 02/22/2019   Atrial fibrillation with RVR (HCC) 02/26/2019   COVID-19 02/22/2019   Dysrhythmia    para. atrial fibrillation   Flu-like symptoms 02/22/2019   Hypertension    Knee osteoarthritis 10/24/2017   Pneumonia     Encounter Details:  Community Questionnaire - 09/13/23 0933       Questionnaire   Ask client: Do you give verbal consent for me to treat you today? Yes    Student Assistance N/A    Location Patient Served  [facility]    Encounter Setting CN site    Population Status Migrant/Refugee    Insurance Unknown    Insurance/Financial Assistance Referral N/A    Medication Have Medication Insecurities    Medical Provider Yes    Screening Referrals Made N/A    Medical Referrals Made N/A    Medical Appointment Completed N/A    CNP Interventions Advocate/Support;Navigate Healthcare System;Counsel;Educate;Case Management    Screenings CN Performed Blood Pressure;Weight    ED Visit Averted Yes    Life-Saving Intervention Made N/A         Client came into nurse clinic saying her medications had arrived by mail.  BP elevated at 178/94.  Client encouraged to take BP medicine this morning and as prescribed.  Client re-weighed herself and asked this CN to measure her height.  Client also tells me that she cannot see well.  Provided client with some glasses of her choosing to take with her.  Client very happy and smiling today.  Lugene Ropes, RN, MSN, CNP [phone] Office [phone] Cell

## 2023-11-09 NOTE — Progress Notes (Signed)
 Remote PPM Transmission

## 2023-11-13 ENCOUNTER — Ambulatory Visit (INDEPENDENT_AMBULATORY_CARE_PROVIDER_SITE_OTHER): Payer: Self-pay

## 2023-11-13 DIAGNOSIS — I480 Paroxysmal atrial fibrillation: Secondary | ICD-10-CM

## 2023-11-13 LAB — CUP PACEART REMOTE DEVICE CHECK
Battery Remaining Longevity: 105 mo
Battery Remaining Percentage: 95.5 %
Battery Voltage: 3.01 V
Brady Statistic AP VP Percent: 1 %
Brady Statistic AP VS Percent: 2.7 %
Brady Statistic AS VP Percent: 2.5 %
Brady Statistic AS VS Percent: 94 %
Brady Statistic RA Percent Paced: 2 %
Brady Statistic RV Percent Paced: 2.7 %
Date Time Interrogation Session: 20251013020014
Implantable Lead Connection Status: 753985
Implantable Lead Connection Status: 753985
Implantable Lead Implant Date: 20250227
Implantable Lead Implant Date: 20250227
Implantable Lead Location: 753859
Implantable Lead Location: 753860
Implantable Pulse Generator Implant Date: 20250227
Lead Channel Impedance Value: 440 Ohm
Lead Channel Impedance Value: 530 Ohm
Lead Channel Pacing Threshold Amplitude: 0.75 V
Lead Channel Pacing Threshold Amplitude: 0.75 V
Lead Channel Pacing Threshold Pulse Width: 0.5 ms
Lead Channel Pacing Threshold Pulse Width: 0.5 ms
Lead Channel Sensing Intrinsic Amplitude: 12 mV
Lead Channel Sensing Intrinsic Amplitude: 4.2 mV
Lead Channel Setting Pacing Amplitude: 1.75 V
Lead Channel Setting Pacing Amplitude: 5 V
Lead Channel Setting Pacing Pulse Width: 0.5 ms
Lead Channel Setting Sensing Sensitivity: 2 mV
Pulse Gen Model: 2272
Pulse Gen Serial Number: 5996983

## 2023-11-14 NOTE — Progress Notes (Signed)
 Remote PPM Transmission

## 2023-11-21 ENCOUNTER — Other Ambulatory Visit: Payer: Self-pay

## 2023-11-21 ENCOUNTER — Other Ambulatory Visit: Payer: Self-pay | Admitting: Family Medicine

## 2023-11-21 NOTE — Congregational Nurse Program (Signed)
  Dept: [phone]   Congregational Nurse Program Note  Date of Encounter: 11/21/2023  Past Medical History: Past Medical History:  Diagnosis Date   Acute kidney injury 03/02/2019   Acute respiratory failure with hypoxia (HCC) 02/22/2019   Atrial fibrillation with RVR (HCC) 02/26/2019   COVID-19 02/22/2019   Dysrhythmia    para. atrial fibrillation   Flu-like symptoms 02/22/2019   Hypertension    Knee osteoarthritis 10/24/2017   Pneumonia     Encounter Details:  Community Questionnaire - 11/21/23 1314       Questionnaire   Ask client: Do you give verbal consent for me to treat you today? Yes    Student Assistance N/A   [name] [name], EMT   Location Patient Served  [facility]    Encounter Setting CN site    Population Status Migrant/Refugee    Insurance Uninsured ([place] [employer]/Care Connects/Self-Pay/Medicaid Family Planning)    Insurance/Financial Assistance Referral N/A    Medication Have Medication Insecurities;Provided Medication Assistance;Patient Medications Reviewed    Medical Provider Yes    Screening Referrals Made N/A    Medical Referrals Made N/A    Medical Appointment Completed [facility]    CNP Interventions Advocate/Support;Navigate Healthcare System;Counsel;Educate;Case Management    Screenings CN Performed Blood Pressure    ED Visit Averted N/A    Life-Saving Intervention Made N/A         Patient has run out of medications. I have contacted [facility] and medications will be refilled and delivered to her home.   [name] Reshma Hoey RN BSN PCCN  [facility] [phone] [phone]-office

## 2023-11-25 ENCOUNTER — Ambulatory Visit: Payer: Self-pay | Admitting: Cardiology

## 2023-12-01 ENCOUNTER — Other Ambulatory Visit: Payer: Self-pay

## 2023-12-04 NOTE — Congregational Nurse Program (Signed)
 Patient came in for blood pressure check. Stated she took her BP medication this morning. BP elevated, will return tomorrow for re-check and make appointment with PCP.

## 2023-12-05 NOTE — Telephone Encounter (Signed)
 Appointment scheduled with PCP per patient request for high blood pressure concerns. Patient called and made aware.

## 2023-12-12 ENCOUNTER — Other Ambulatory Visit: Payer: Self-pay

## 2023-12-12 ENCOUNTER — Other Ambulatory Visit: Payer: Self-pay | Admitting: Family Medicine

## 2023-12-12 DIAGNOSIS — M10072 Idiopathic gout, left ankle and foot: Secondary | ICD-10-CM

## 2023-12-12 MED ORDER — COLCHICINE 0.6 MG PO TABS
ORAL_TABLET | ORAL | 0 refills | Status: AC
  Filled 2023-12-12: qty 30, 10d supply, fill #0

## 2023-12-12 NOTE — Congregational Nurse Program (Signed)
  Dept: [phone]   Congregational Nurse Program Note  Date of Encounter: 12/12/2023  Past Medical History: Past Medical History:  Diagnosis Date   Acute kidney injury 03/02/2019   Acute respiratory failure with hypoxia (HCC) 02/22/2019   Atrial fibrillation with RVR (HCC) 02/26/2019   COVID-19 02/22/2019   Dysrhythmia    para. atrial fibrillation   Flu-like symptoms 02/22/2019   Hypertension    Knee osteoarthritis 10/24/2017   Pneumonia     Encounter Details:  Community Questionnaire - 12/12/23 1132       Questionnaire   Ask client: Do you give verbal consent for me to treat you today? Yes    Chiropractor    Location Patient Served  [facility]    Encounter Setting CN site    Population Status Migrant/Refugee    Insurance Uninsured ([place] [employer]/Care Connects/Self-Pay/Medicaid Family Planning)    Insurance/Financial Assistance Referral N/A    Medication Have Medication Insecurities;Provided Medication Assistance;Patient Medications Reviewed    Medical Provider Yes    Screening Referrals Made N/A    Medical Referrals Made N/A    Medical Appointment Completed [facility]    CNP Interventions Advocate/Support;Navigate Healthcare System;Counsel;Educate;Case Management    Screenings CN Performed N/A    ED Visit Averted N/A    Life-Saving Intervention Made N/A         Patient came in requesting medications refill. We were able to refill rosuvastatin  and amlodipine . The rest of the medication will need MD to authorize. Pharmacy has sent a request to provider. Medication will be delivered to patient's home when ready.   Naomie Jaymir Struble RN BSN PCCN  [facility] [phone]-cell [phone]-office

## 2023-12-13 ENCOUNTER — Other Ambulatory Visit: Payer: Self-pay

## 2023-12-13 MED ORDER — ONDANSETRON HCL 4 MG PO TABS
4.0000 mg | ORAL_TABLET | Freq: Three times a day (TID) | ORAL | 0 refills | Status: AC | PRN
  Filled 2023-12-13: qty 20, 7d supply, fill #0

## 2023-12-18 ENCOUNTER — Other Ambulatory Visit: Payer: Self-pay

## 2023-12-19 NOTE — Congregational Nurse Program (Signed)
  Dept: [phone]   Congregational Nurse Program Note  Date of Encounter: 12/19/2023  Past Medical History: Past Medical History:  Diagnosis Date   Acute kidney injury 03/02/2019   Acute respiratory failure with hypoxia (HCC) 02/22/2019   Atrial fibrillation with RVR (HCC) 02/26/2019   COVID-19 02/22/2019   Dysrhythmia    para. atrial fibrillation   Flu-like symptoms 02/22/2019   Hypertension    Knee osteoarthritis 10/24/2017   Pneumonia     Encounter Details:  Community Questionnaire - 12/19/23 1145       Questionnaire   Ask client: Do you give verbal consent for me to treat you today? Yes    Chiropractor    Location Patient Served  [facility]    Encounter Setting CN site    Population Status Migrant/Refugee    Insurance Uninsured ([place] [employer]/Care Connects/Self-Pay/Medicaid Family Planning)    Insurance/Financial Assistance Referral N/A    Medication Have Medication Insecurities    Medical Provider Yes    Screening Referrals Made N/A    Medical Referrals Made N/A    Medical Appointment Completed [facility]    CNP Interventions Advocate/Support;Navigate Healthcare System;Counsel;Educate;Case Management    Screenings CN Performed Blood Pressure    ED Visit Averted N/A    Life-Saving Intervention Made N/A         Patient came in for routine blood pressure check. Upcoming appointment reviewed.   Naomie Nagee Goates RN BSN PCCN  [facility] [phone]-cell [phone]-office

## 2024-01-15 ENCOUNTER — Ambulatory Visit: Payer: Self-pay

## 2024-01-15 NOTE — Telephone Encounter (Signed)
 FYI Only or Action Required?: FYI only for provider: [facility] on 01/17/24.  Patient was last seen in primary care on 07/25/2023 by Newlin, Enobong, MD.  Called Nurse Triage reporting Cough.  Symptoms began several weeks ago.  Interventions attempted: OTC medications: Cough syrup.  Symptoms are: stable.  Triage Disposition: See PCP When Office is Open (Within 3 Days)  Patient/caregiver understands and will follow disposition?: Yes  Reason for Disposition  Cough has been present for > 3 weeks  Answer Assessment - Initial Assessment Questions Using [facility] [phone]. Patient reports having a cough 5/10 severity that started on 11/30, did see blood once, but now thick white w/ a little yellow mucus. Denies SOB, CP. Reports dizziness, but not now; sore throat, headache, watery eyes w/coughing, poor vision needing to see eye doctor. During triage the call dropped, called patient back with interpreter PI#[id number]. Advised OV, no availability with PCP or any provider in the tie[place] region until January, advised [employer]. She agrees to go, but says no one is there to write down the address. I asked is her neighbor Lauraine, who called earlier, would be able to take the address down. She says she will take her. I conference [name] in on the call, patient gave permission to speak to her. Advised of the [employer]. She says she can't take her tomorrow, but could on Wednesday. Advised of the location/time, symptoms to call 911 given to [name] (CP, SOB, dizziness) who verbalized understanding. Call ended with Lauraine, patient verbalized understanding of advice given.     Using honey and lemon, taking a medication for cough finished today  1. ONSET: When did the cough begin?      12/31/23  2. SEVERITY: How bad is the cough today?      5-6  3. SPUTUM: Describe the color of your sputum (e.g., none, dry cough; clear, white, yellow, green)     White, thick, a little  yellow  4. HEMOPTYSIS: Are you coughing up any blood? If Yes, ask: How much? (e.g., flecks, streaks, tablespoons, etc.)     Yes, but not now  5. DIFFICULTY BREATHING: Are you having difficulty breathing? If Yes, ask: How bad is it? (e.g., mild, moderate, severe)      No  6. FEVER: Do you have a fever? If Yes, ask: What is your temperature, how was it measured, and when did it start?     No  7. CARDIAC HISTORY: Do you have any history of heart disease? (e.g., heart attack, congestive heart failure)      No  8. LUNG HISTORY: Do you have any history of lung disease?  (e.g., pulmonary embolus, asthma, emphysema)     No  9. PE RISK FACTORS: Do you have a history of blood clots? (or: recent major surgery, recent prolonged travel, bedridden)     No  10. OTHER SYMPTOMS: Do you have any other symptoms? (e.g., runny nose, wheezing, chest pain)       Sore throat, headache, watery eyes, poor vision  Protocols used: Cough - Acute Productive-A-AH  Copied from CRM #[id number]. Topic: Clinical - Red Word Triage >> Jan 15, 2024  9:56 AM Treva T wrote: [facility] that prompted transfer to Nurse Triage:Received call from neighbor of pt, [name], with concerns regarding pt.    Reports pt states is not feeling well, really bad cough, with increased fatigue and dizziness, also reports she's unable to see very well, also blood pressure issues, past concerns of  chest pain.   Reports pt speaks very limited English, and unsure if pt understands how to use at home blood pressure monitor.    Requesting appt or medical advice for pt.    [name] (neighbor)  Ph. [phone]

## 2024-01-15 NOTE — Telephone Encounter (Signed)
 Attempt # 1 to reach patient to triage symptoms. Left VM to call back    Copied from CRM #[id number]. Topic: Clinical - Red Word Triage >> Jan 15, 2024  9:56 AM Treva T wrote: [facility] that prompted transfer to Nurse Triage:Received call from neighbor of pt, [name] [name], with concerns regarding pt.   Reports pt states is not feeling well, really bad cough, with increased fatigue and dizziness, also reports she's unable to see very well, also blood pressure issues, past concerns of chest pain.  Reports pt speaks very limited English, and unsure if pt understands how to use at home blood pressure monitor.   Requesting appt or medical advice for pt.   [name] [name] (neighbor)  Ph. [phone]

## 2024-01-16 ENCOUNTER — Ambulatory Visit: Payer: Self-pay | Admitting: Family Medicine

## 2024-01-16 NOTE — Telephone Encounter (Signed)
 noted

## 2024-01-17 ENCOUNTER — Other Ambulatory Visit: Payer: Self-pay

## 2024-01-17 ENCOUNTER — Ambulatory Visit: Payer: Self-pay | Admitting: Physician Assistant

## 2024-01-17 ENCOUNTER — Encounter: Payer: Self-pay | Admitting: Physician Assistant

## 2024-01-17 VITALS — BP 121/64 | HR 78 | Ht 62.0 in | Wt 199.0 lb

## 2024-01-17 DIAGNOSIS — D696 Thrombocytopenia, unspecified: Secondary | ICD-10-CM

## 2024-01-17 DIAGNOSIS — H538 Other visual disturbances: Secondary | ICD-10-CM

## 2024-01-17 DIAGNOSIS — J208 Acute bronchitis due to other specified organisms: Secondary | ICD-10-CM

## 2024-01-17 DIAGNOSIS — H6123 Impacted cerumen, bilateral: Secondary | ICD-10-CM

## 2024-01-17 DIAGNOSIS — R42 Dizziness and giddiness: Secondary | ICD-10-CM

## 2024-01-17 DIAGNOSIS — E669 Obesity, unspecified: Secondary | ICD-10-CM

## 2024-01-17 DIAGNOSIS — Z6836 Body mass index (BMI) 36.0-36.9, adult: Secondary | ICD-10-CM

## 2024-01-17 DIAGNOSIS — B9689 Other specified bacterial agents as the cause of diseases classified elsewhere: Secondary | ICD-10-CM

## 2024-01-17 DIAGNOSIS — E119 Type 2 diabetes mellitus without complications: Secondary | ICD-10-CM

## 2024-01-17 LAB — HEMOGLOBIN A1C: Hemoglobin-A1c: 5.5

## 2024-01-17 MED ORDER — BENZONATATE 100 MG PO CAPS
100.0000 mg | ORAL_CAPSULE | Freq: Three times a day (TID) | ORAL | 0 refills | Status: AC | PRN
  Filled 2024-01-17: qty 20, 4d supply, fill #0

## 2024-01-17 MED ORDER — AMOXICILLIN-POT CLAVULANATE 875-125 MG PO TABS
1.0000 | ORAL_TABLET | Freq: Two times a day (BID) | ORAL | 0 refills | Status: AC
  Filled 2024-01-17: qty 20, 10d supply, fill #0

## 2024-01-17 NOTE — Progress Notes (Unsigned)
 Established Patient Office Visit  Subjective   Patient ID: [name] [name], female    DOB: 03-24-1950  Age: 73 y.o. MRN: [id number]  Chief Complaint  Patient presents with   Cough    She has had a cough for 2 weeks, hot flashes, and dizzy spells. She states she threw up yesterday am  Nyquil ans dayquil  History of Present Illness [name] [name] is a 73 year old female who presents with a persistent cough, dizziness, and blurry vision.  She has had a productive cough for about two and a half weeks with whitish-yellow mucus and a single small spot of blood in the first week. DayQuil helps, NyQuil does not.  She feels hot inside with dizzy spells, headaches, and blurry vision. The blurry vision has been present for about five years but has recently worsened. She has not been prescribed glasses and uses reading glasses occasionally.  She has bilateral ear pain and decreased hearing, and feels her ears are dirty. 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  Physical Exam GENERAL: Alert, cooperative, well developed, no acute distress. HEENT: Normocephalic, normal oropharynx, moist mucous membranes. Right ear impacted with cerumen, unable to visualize tympanic membrane. Left ear impacted with cerumen. CHEST: Clear to auscultation bilaterally. No wheezes, rhonchi, or crackles. CARDIOVASCULAR: Normal heart rate and rhythm. S1 and S2 normal without murmurs. ABDOMEN: Soft, non-tender, non-distended, without organomegaly. Normal bowel sounds. EXTREMITIES: No cyanosis or edema. NEUROLOGICAL: Cranial nerves grossly intact. Moves all extremities without gross motor or sensory deficit.  Assessment and Plan Acute bacterial bronchitis Symptoms do not indicate bacterial bronchitis. - Advised against Nyquil due to cardiac risks; DayQuil acceptable.  Impacted cerumen, bilateral Bilateral impacted cerumen causing hearing difficulties. Eardrums not visible due  to wax. - Recommended Debrox drops. - Referred to [facility] and [facility] for professional removal. - Coordinated with Doctor Newland for earlier appointment.  Blurry vision and dizziness Blurry vision worsening over five years with recent dizziness. Blood work planned to rule out systemic causes. - Ordered blood work.    HPI  Past Medical History:  Diagnosis Date   Acute kidney injury 03/02/2019   Acute respiratory failure with hypoxia (HCC) 02/22/2019   Atrial fibrillation with RVR (HCC) 02/26/2019   COVID-19 02/22/2019   Dysrhythmia    para. atrial fibrillation   Flu-like symptoms 02/22/2019   Hypertension    Knee osteoarthritis 10/24/2017   Pneumonia    Social History   Socioeconomic History   Marital status: Married    Spouse name: Not on file   Number of children: Not on file   Years of education: Not on file   Highest education level: Not on file  Occupational History   Not on file  Tobacco Use   Smoking status: Never   Smokeless tobacco: Never  Vaping Use   Vaping status: Never Used  Substance and Sexual Activity   Alcohol use: Never   Drug use: Never   Sexual activity: Not Currently    Birth control/protection: Post-menopausal  Other Topics Concern   Not on file  Social History Narrative   Not on file   Social Drivers of Health   Tobacco Use: Low Risk (07/25/2023)   Patient History    Smoking Tobacco Use: Never    Smokeless Tobacco Use: Never    Passive Exposure: Not on file  Financial Resource Strain: Not on file  Food Insecurity: Food Insecurity Present (01/17/2024)   Epic  Worried About Programme Researcher, Broadcasting/film/video in the Last Year: Sometimes true    [employer] of Food in the Last Year: Sometimes true  Transportation Needs: No Transportation Needs (01/17/2024)   Epic    Lack of Transportation (Medical): No    Lack of Transportation (Non-Medical): No  Physical Activity: Not on file  Stress: Not on file  Social  Connections: Unknown (03/30/2023)   Social Connection and Isolation Panel    Frequency of Communication with Friends and Family: Once a week    Frequency of Social Gatherings with Friends and Family: Once a week    Attends Religious Services: 1 to 4 times per year    Active Member of [employer] or Organizations: No    Attends Engineer, Structural: 1 to 4 times per year    Marital Status: Not on file  Intimate Partner Violence: Not At Risk (03/30/2023)   Humiliation, Afraid, Rape, and Kick questionnaire    Fear of Current or Ex-Partner: No    Emotionally Abused: No    Physically Abused: No    Sexually Abused: No  Depression (PHQ2-9): Low Risk (02/23/2023)   Depression (PHQ2-9)    PHQ-2 Score: 0  Alcohol Screen: Not on file  Housing: Low Risk (01/17/2024)   Epic    Unable to Pay for Housing in the Last Year: No    Number of Times Moved in the Last Year: 1    Homeless in the Last Year: No  Utilities: Not At Risk (01/17/2024)   Epic    Threatened with loss of utilities: No  Health Literacy: Not on file   Family History  Problem Relation Age of Onset   Breast cancer Neg Hx    Allergies[1]  ROS    Objective:     BP 121/64 (BP Location: Left Arm, Patient Position: Sitting)   Pulse 78   Ht 5' 2 (1.575 m)   Wt 199 lb (90.3 kg)   LMP  (LMP Unknown)   SpO2 98%   BMI 36.40 kg/m  BP Readings from Last 3 Encounters:  01/17/24 121/64  12/19/23 (!) 165/74  12/04/23 (!) 152/91   Wt Readings from Last 3 Encounters:  01/17/24 199 lb (90.3 kg)  09/13/23 190 lb (86.2 kg)  09/12/23 200 lb 6.4 oz (90.9 kg)    Physical Exam      Assessment & Plan:   Problem List Items Addressed This Visit   None   No follow-ups on file.    Roshawn Ayala S Mayers, PA-C       [1] No Known Allergies

## 2024-01-18 ENCOUNTER — Ambulatory Visit: Payer: Self-pay | Admitting: Physician Assistant

## 2024-01-18 ENCOUNTER — Ambulatory Visit: Payer: Self-pay

## 2024-01-18 DIAGNOSIS — R7989 Other specified abnormal findings of blood chemistry: Secondary | ICD-10-CM

## 2024-01-18 LAB — CBC WITH DIFFERENTIAL/PLATELET
Basophils Absolute: 0 x10E3/uL (ref 0.0–0.2)
Basos: 0 %
EOS (ABSOLUTE): 0 x10E3/uL (ref 0.0–0.4)
Eos: 1 %
Hematocrit: 38.6 % (ref 34.0–46.6)
Hemoglobin: 11.7 g/dL (ref 11.1–15.9)
Immature Grans (Abs): 0 x10E3/uL (ref 0.0–0.1)
Immature Granulocytes: 0 %
Lymphocytes Absolute: 1.8 x10E3/uL (ref 0.7–3.1)
Lymphs: 40 %
MCH: 24.6 pg — ABNORMAL LOW (ref 26.6–33.0)
MCHC: 30.3 g/dL — ABNORMAL LOW (ref 31.5–35.7)
MCV: 81 fL (ref 79–97)
Monocytes Absolute: 0.6 x10E3/uL (ref 0.1–0.9)
Monocytes: 14 %
Neutrophils Absolute: 2 x10E3/uL (ref 1.4–7.0)
Neutrophils: 45 %
Platelets: 118 x10E3/uL — ABNORMAL LOW (ref 150–450)
RBC: 4.76 x10E6/uL (ref 3.77–5.28)
RDW: 17.4 % — ABNORMAL HIGH (ref 11.7–15.4)
WBC: 4.4 x10E3/uL (ref 3.4–10.8)

## 2024-01-18 LAB — BASIC METABOLIC PANEL WITH GFR
BUN/Creatinine Ratio: 11 — ABNORMAL LOW (ref 12–28)
BUN: 12 mg/dL (ref 8–27)
CO2: 19 mmol/L — ABNORMAL LOW (ref 20–29)
Calcium: 9.1 mg/dL (ref 8.7–10.3)
Chloride: 110 mmol/L — ABNORMAL HIGH (ref 96–106)
Creatinine, Ser: 1.06 mg/dL — ABNORMAL HIGH (ref 0.57–1.00)
Glucose: 103 mg/dL — ABNORMAL HIGH (ref 70–99)
Potassium: 3.6 mmol/L (ref 3.5–5.2)
Sodium: 143 mmol/L (ref 134–144)
eGFR: 55 mL/min/1.73 — ABNORMAL LOW (ref >59)

## 2024-01-18 LAB — TSH: TSH: 0.362 u[IU]/mL — ABNORMAL LOW (ref 0.450–4.500)

## 2024-01-22 NOTE — Progress Notes (Signed)
 Patient has a follow up with her PCP in feb. Do you wish for her to be seen sooner at [facility] or with [facility] for follow up prior to seeing her PCP

## 2024-01-22 NOTE — Progress Notes (Signed)
 Spoke with pt using physician services. Appt scheduled for Jan. 15th. She reports she is drinking water  and will continue to do so.

## 2024-02-12 ENCOUNTER — Ambulatory Visit: Payer: Self-pay

## 2024-02-12 DIAGNOSIS — I480 Paroxysmal atrial fibrillation: Secondary | ICD-10-CM

## 2024-02-13 LAB — CUP PACEART REMOTE DEVICE CHECK
Battery Remaining Longevity: 126 mo
Battery Remaining Percentage: 95 %
Battery Voltage: 3.01 V
Brady Statistic AP VP Percent: 1 %
Brady Statistic AP VS Percent: 3 %
Brady Statistic AS VP Percent: 2.1 %
Brady Statistic AS VS Percent: 94 %
Brady Statistic RA Percent Paced: 2.2 %
Brady Statistic RV Percent Paced: 2.3 %
Date Time Interrogation Session: 20260112020017
Implantable Lead Connection Status: 753985
Implantable Lead Connection Status: 753985
Implantable Lead Implant Date: 20250227
Implantable Lead Implant Date: 20250227
Implantable Lead Location: 753859
Implantable Lead Location: 753860
Implantable Pulse Generator Implant Date: 20250227
Lead Channel Impedance Value: 440 Ohm
Lead Channel Impedance Value: 480 Ohm
Lead Channel Pacing Threshold Amplitude: 0.75 V
Lead Channel Pacing Threshold Amplitude: 0.875 V
Lead Channel Pacing Threshold Pulse Width: 0.5 ms
Lead Channel Pacing Threshold Pulse Width: 0.5 ms
Lead Channel Sensing Intrinsic Amplitude: 12 mV
Lead Channel Sensing Intrinsic Amplitude: 4.2 mV
Lead Channel Setting Pacing Amplitude: 1.125
Lead Channel Setting Pacing Amplitude: 1.75 V
Lead Channel Setting Pacing Pulse Width: 0.5 ms
Lead Channel Setting Sensing Sensitivity: 2 mV
Pulse Gen Model: 2272
Pulse Gen Serial Number: 5996983

## 2024-02-14 ENCOUNTER — Telehealth: Payer: Self-pay | Admitting: Family Medicine

## 2024-02-14 NOTE — Congregational Nurse Program (Signed)
" °  Dept: [phone]   Congregational Nurse Program Note  Date of Encounter: 02/14/2024  Past Medical History: Past Medical History:  Diagnosis Date   Acute kidney injury 03/02/2019   Acute respiratory failure with hypoxia (HCC) 02/22/2019   Atrial fibrillation with RVR (HCC) 02/26/2019   COVID-19 02/22/2019   Dysrhythmia    para. atrial fibrillation   Flu-like symptoms 02/22/2019   Hypertension    Knee osteoarthritis 10/24/2017   Pneumonia     Encounter Details:  Community Questionnaire - 02/14/24 1230       Questionnaire   Ask client: Do you give verbal consent for me to treat you today? Yes    Student Assistance [employer]    Location Patient Served  [facility]    Encounter Setting CN site    Insurance Uninsured ([employer]s/Self-Pay/Medicaid Family Planning)    Insurance/Financial Assistance Referral N/A    Medication Have Medication Insecurities    Medical Provider Yes    Medical Referrals Made [facility]    Medical Appointment Completed [facility]    Screenings CN Performed (remember to also record results) NA    CNP Interventions Health Counseling;Advocate/Support;Case Management    ED Visit Averted N/A        Patient received a call from [phone] and missed it. Informed that it could for appointment reminder for tomorrow. No voice message left. Patient informed date time and location of the appointment.  Naomie Laysha Childers RN BSN PCCN  [facility] [phone]-cell [phone]-office     "

## 2024-02-14 NOTE — Telephone Encounter (Signed)
 Contacted pt no vm to confirmed appt (per vr)

## 2024-02-15 ENCOUNTER — Ambulatory Visit: Payer: Self-pay | Attending: Family Medicine | Admitting: Family Medicine

## 2024-02-15 ENCOUNTER — Other Ambulatory Visit: Payer: Self-pay

## 2024-02-15 ENCOUNTER — Encounter: Payer: Self-pay | Admitting: Family Medicine

## 2024-02-15 VITALS — BP 172/81 | HR 66 | Temp 98.1°F | Ht 62.0 in | Wt 202.2 lb

## 2024-02-15 DIAGNOSIS — E7800 Pure hypercholesterolemia, unspecified: Secondary | ICD-10-CM

## 2024-02-15 DIAGNOSIS — I482 Chronic atrial fibrillation, unspecified: Secondary | ICD-10-CM

## 2024-02-15 DIAGNOSIS — M21611 Bunion of right foot: Secondary | ICD-10-CM

## 2024-02-15 DIAGNOSIS — E785 Hyperlipidemia, unspecified: Secondary | ICD-10-CM

## 2024-02-15 DIAGNOSIS — I10 Essential (primary) hypertension: Secondary | ICD-10-CM

## 2024-02-15 DIAGNOSIS — E1159 Type 2 diabetes mellitus with other circulatory complications: Secondary | ICD-10-CM

## 2024-02-15 DIAGNOSIS — M79604 Pain in right leg: Secondary | ICD-10-CM

## 2024-02-15 DIAGNOSIS — R946 Abnormal results of thyroid function studies: Secondary | ICD-10-CM

## 2024-02-15 DIAGNOSIS — I739 Peripheral vascular disease, unspecified: Secondary | ICD-10-CM

## 2024-02-15 DIAGNOSIS — H538 Other visual disturbances: Secondary | ICD-10-CM

## 2024-02-15 DIAGNOSIS — E1169 Type 2 diabetes mellitus with other specified complication: Secondary | ICD-10-CM

## 2024-02-15 DIAGNOSIS — Z23 Encounter for immunization: Secondary | ICD-10-CM

## 2024-02-15 DIAGNOSIS — E669 Obesity, unspecified: Secondary | ICD-10-CM

## 2024-02-15 DIAGNOSIS — M79605 Pain in left leg: Secondary | ICD-10-CM

## 2024-02-15 LAB — POCT ABI - SCREENING FOR PILOT NO CHARGE: Right ABI: 1.33

## 2024-02-15 MED ORDER — AMLODIPINE BESYLATE 5 MG PO TABS
5.0000 mg | ORAL_TABLET | Freq: Every day | ORAL | 1 refills | Status: AC
  Filled 2024-02-15: qty 90, 90d supply, fill #0

## 2024-02-15 MED ORDER — ROSUVASTATIN CALCIUM 10 MG PO TABS
10.0000 mg | ORAL_TABLET | Freq: Every day | ORAL | 1 refills | Status: AC
  Filled 2024-02-15: qty 90, 90d supply, fill #0

## 2024-02-15 MED ORDER — OZEMPIC (0.25 OR 0.5 MG/DOSE) 2 MG/1.5ML ~~LOC~~ SOPN
0.2500 mg | PEN_INJECTOR | SUBCUTANEOUS | 6 refills | Status: DC
  Filled 2024-02-15: qty 3, 112d supply, fill #0

## 2024-02-15 MED ORDER — TIZANIDINE HCL 4 MG PO TABS
4.0000 mg | ORAL_TABLET | Freq: Every evening | ORAL | 2 refills | Status: AC | PRN
  Filled 2024-02-15: qty 30, 30d supply, fill #0

## 2024-02-15 MED ORDER — APIXABAN 5 MG PO TABS
5.0000 mg | ORAL_TABLET | Freq: Two times a day (BID) | ORAL | 1 refills | Status: AC
  Filled 2024-02-15: qty 30, 15d supply, fill #0
  Filled 2024-02-15: qty 180, 90d supply, fill #0

## 2024-02-15 MED ORDER — METOPROLOL SUCCINATE ER 25 MG PO TB24
25.0000 mg | ORAL_TABLET | Freq: Every day | ORAL | 1 refills | Status: AC
  Filled 2024-02-15: qty 90, 90d supply, fill #0

## 2024-02-15 MED ORDER — TRULICITY 0.75 MG/0.5ML ~~LOC~~ SOAJ
0.7500 mg | SUBCUTANEOUS | 1 refills | Status: AC
  Filled 2024-02-15: qty 2, 28d supply, fill #0

## 2024-02-15 NOTE — Progress Notes (Signed)
 Remote PPM Transmission

## 2024-02-15 NOTE — Patient Instructions (Signed)
 VISIT SUMMARY:  During today's visit, we discussed your ongoing medical conditions and addressed several concerns you have been experiencing. We reviewed your medications, discussed your symptoms, and made referrals to specialists for further evaluation.  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 You have been experiencing leg pain, especially at night, which may be due to circulation issues. We have referred you to a vascular specialist for further evaluation.  -CHRONIC ATRIAL FIBRILLATION WITH PACEMAKER: Atrial fibrillation is an irregular and often rapid heart rate that can lead to poor blood flow. You have been experiencing palpitations and dizziness, likely due to not taking your metoprolol . We have refilled your metoprolol  prescription and emphasized the importance of taking it regularly to manage your heart rate and reduce palpitations.  -ESSENTIAL HYPERTENSION: Hypertension is high blood pressure, which can lead to serious health problems if not managed properly. Your blood pressure is elevated, possibly due to irregular medication intake. It is important to take your blood pressure medications regularly. We have scheduled a follow-up in one month to reassess your blood pressure.  -TYPE 2 DIABETES MELLITUS, WELL CONTROLLED: Type 2 diabetes is a condition that affects the way your body processes blood sugar. Your diabetes is well controlled with a recent A1c of 5.5, managed through diet. Continue with your current dietary management.  -HYPERCHOLESTEROLEMIA: Hypercholesterolemia is having high levels of cholesterol in the blood, which can increase the risk of heart disease. You are currently taking rosuvastatin  to manage your cholesterol levels. Continue taking rosuvastatin  as prescribed.  -BUNION OF RIGHT GREAT TOE: A bunion is a bony bump that forms on the  joint at the base of your big toe, causing discomfort. You are interested in surgical correction. We have referred you to a podiatrist for evaluation and discussion of surgical options.  -BLURRY VISION, LEFT EYE: You have reported blurry vision in your left eye. We have referred you to an ophthalmologist for further evaluation.  INSTRUCTIONS:  Please follow up with the vascular specialist, podiatrist, and ophthalmologist as referred. Ensure you take your medications regularly, especially metoprolol  and your blood pressure medications. We will reassess your blood pressure in one month. Continue with your current dietary management for diabetes and take rosuvastatin  as prescribed.

## 2024-02-15 NOTE — Progress Notes (Signed)
 "  Subjective:  Patient ID: [name] [name], female    DOB: [birth year]  Age: 74 y.o. MRN: [id number]  CC: Medical Management of Chronic Issues     Discussed the use of AI scribe software for clinical note transcription with the patient, who gave verbal consent to proceed.  History of Present Illness [name] [name] is a 74 year old female with a history of Type 2 DM (diet controlled ), Hypertension,  A.fib with RVR, sinus node dysfunction status post pacemaker placement, history of b/l TKA, Gout  who presents for follow-up on her medical conditions.  She has palpitations and dizziness, which she relates to high blood pressure. She has atrial fibrillation and has run out of metoprolol  and needs a refill. She continues Eliquis .  She has a persistent painful cough, with pain worse on the left side.  She has severe cramps in her feet and legs at night, worse in cold weather, sometimes causing her to cry. She denies back pain.  Her left eye is blurry, and she relies mainly on her right eye. She requests referral to an eye specialist.  She has a bunion on her right foot with toe deformity and wants to discuss surgical options.  Her diabetes is diet controlled. Her last A1c was 5.5.    Past Medical History:  Diagnosis Date   Acute kidney injury 03/02/2019   Acute respiratory failure with hypoxia (HCC) 02/22/2019   Atrial fibrillation with RVR (HCC) 02/26/2019   COVID-19 02/22/2019   Dysrhythmia    para. atrial fibrillation   Flu-like symptoms 02/22/2019   Hypertension    Knee osteoarthritis 10/24/2017   Pneumonia     Past Surgical History:  Procedure Laterality Date   NO PAST SURGERIES     PACEMAKER IMPLANT N/A 03/30/2023   Procedure: PACEMAKER IMPLANT;  Surgeon: Kennyth Chew, MD;  Location: [facility] INVASIVE CV LAB;  Service: Cardiovascular;  Laterality: N/A;   TOTAL KNEE ARTHROPLASTY Right 12/03/2019   Procedure: RIGHT TOTAL KNEE ARTHROPLASTY;  Surgeon: Addie Cordella Hamilton, MD;  Location: [facility] OR;  Service: Orthopedics;  Laterality: Right;   TOTAL KNEE ARTHROPLASTY Left 07/07/2020   Procedure: LEFT TOTAL KNEE ARTHROPLASTY-CEMENTED;  Surgeon: Addie Cordella Hamilton, MD;  Location: [facility] OR;  Service: Orthopedics;  Laterality: Left;    Family History  Problem Relation Age of Onset   Breast cancer Neg Hx     Social History   Socioeconomic History   Marital status: Married    Spouse name: Not on file   Number of children: Not on file   Years of education: Not on file   Highest education level: Not on file  Occupational History   Not on file  Tobacco Use   Smoking status: Never   Smokeless tobacco: Never  Vaping Use   Vaping status: Never Used  Substance and Sexual Activity   Alcohol use: Never   Drug use: Never   Sexual activity: Not Currently    Birth control/protection: Post-menopausal  Other Topics Concern   Not on file  Social History Narrative   Not on file   Social Drivers of Health   Tobacco Use: Low Risk (02/15/2024)   Patient History    Smoking Tobacco Use: Never    Smokeless Tobacco Use: Never    Passive Exposure: Not on file  Financial Resource Strain: Not on file  Food Insecurity: Food Insecurity Present (01/17/2024)   Epic    Worried About Radiation Protection Practitioner of Food in the Last Year: Sometimes true  Ran Out of Food in the Last Year: Sometimes true  Transportation Needs: No Transportation Needs (01/17/2024)   Epic    Lack of Transportation (Medical): No    Lack of Transportation (Non-Medical): No  Physical Activity: Not on file  Stress: Not on file  Social Connections: Unknown (03/30/2023)   Social Connection and Isolation Panel    Frequency of Communication with Friends and Family: Once a week    Frequency of Social Gatherings with Friends and Family: Once a week    Attends Religious Services: 1 to 4 times per year    Active Member of [employer] or Organizations: No    Attends Banker Meetings: 1 to 4 times per year     Marital Status: Not on file  Depression (PHQ2-9): Low Risk (01/17/2024)   Depression (PHQ2-9)    PHQ-2 Score: 0  Alcohol Screen: Not on file  Housing: Low Risk (01/17/2024)   Epic    Unable to Pay for Housing in the Last Year: No    Number of Times Moved in the Last Year: 1    Homeless in the Last Year: No  Utilities: Not At Risk (01/17/2024)   Epic    Threatened with loss of utilities: No  Health Literacy: Not on file    Allergies[1]  Outpatient Medications Prior to Visit  Medication Sig Dispense Refill   benzonatate  (TESSALON ) 100 MG capsule Take 1-2 capsules (100-200 mg total) by mouth 3 (three) times daily with meals as needed. 20 capsule 0   Blood Pressure Monitoring (BLOOD PRESSURE CUFF) MISC 1 each by Does not apply route daily. 1 each 0   hydrOXYzine  (ATARAX ) 25 MG tablet Take 1 tablet (25 mg total) by mouth at bedtime as needed. 60 tablet 1   ondansetron  (ZOFRAN ) 4 MG tablet Take 1 tablet (4 mg total) by mouth every 8 (eight) hours as needed for nausea or vomiting. 20 tablet 0   amLODipine  (NORVASC ) 5 MG tablet Take 1 tablet (5 mg total) by mouth daily. 90 tablet 1   apixaban  (ELIQUIS ) 5 MG TABS tablet Take 1 tablet (5 mg total) by mouth 2 (two) times daily.     rosuvastatin  (CRESTOR ) 10 MG tablet Take 1 tablet (10 mg total) by mouth daily. To lower cholesterol 90 tablet 1   amoxicillin -clavulanate (AUGMENTIN ) 875-125 MG tablet Take 1 tablet by mouth 2 (two) times daily. (Patient not taking: Reported on 02/15/2024) 20 tablet 0   colchicine  0.6 MG tablet Take 2 tablets by mouth at the onset of a gout flare, repeat 1 tablet in 1 hour if flare continues. (Patient not taking: Reported on 02/15/2024) 30 tablet 0   cyclobenzaprine  (FLEXERIL ) 5 MG tablet Take 1 tablet (5 mg total) by mouth 2 (two) times daily as needed for muscle spasms. (Patient not taking: Reported on 02/15/2024) 60 tablet 1   metoprolol  succinate (TOPROL  XL) 25 MG 24 hr tablet Take 1 tablet (25 mg total) by mouth  daily. (Patient not taking: Reported on 02/15/2024) 30 tablet 11   No facility-administered medications prior to visit.     ROS Review of Systems  Constitutional:  Negative for activity change and appetite change.  HENT:  Negative for sinus pressure and sore throat.   Eyes:  Positive for visual disturbance.  Respiratory:  Positive for cough. Negative for chest tightness, shortness of breath and wheezing.   Cardiovascular:  Positive for palpitations. Negative for chest pain.  Gastrointestinal:  Negative for abdominal distention, abdominal pain and constipation.  Genitourinary:  Negative.   Musculoskeletal:        See HPI  Psychiatric/Behavioral:  Negative for behavioral problems and dysphoric mood.       Objective:  BP (!) 172/81   Pulse 66   Temp 98.1 F (36.7 C) (Oral)   Ht 5' 2 (1.575 m)   Wt 202 lb 3.2 oz (91.7 kg)   LMP  (LMP Unknown)   SpO2 97%   BMI 36.98 kg/m      02/15/2024   11:52 AM 02/15/2024   11:10 AM 01/17/2024    1:14 PM  BP/Weight  Systolic BP 172 153 121  Diastolic BP 81 87 64  Wt. (Lbs)  202.2 199  BMI  36.98 kg/m2 36.4 kg/m2      Physical Exam Constitutional:      Appearance: She is well-developed.  Cardiovascular:     Rate and Rhythm: Normal rate.     Heart sounds: Normal heart sounds. No murmur heard. Pulmonary:     Effort: Pulmonary effort is normal.     Breath sounds: Normal breath sounds. No wheezing or rales.  Chest:     Chest wall: No tenderness.  Abdominal:     General: Bowel sounds are normal. There is no distension.     Palpations: Abdomen is soft. There is no mass.     Tenderness: There is no abdominal tenderness.  Musculoskeletal:        General: Normal range of motion.     Right lower leg: No edema.     Left lower leg: No edema.  Neurological:     Mental Status: She is alert and oriented to person, place, and time.  Psychiatric:        Mood and Affect: Mood normal.     Diabetic Foot Exam - Simple   Simple Foot  Form Diabetic Foot exam was performed with the following findings: Yes 02/15/2024 11:27 AM  Visual Inspection See comments: Yes Sensation Testing Intact to touch and monofilament testing bilaterally: Yes Pulse Check Comments Bunion of right great toe. No ulceration or skin break down. Unable to palpate bilateral dorsalis and posterior tibialis In office ABI: left - PAD, right -1.33       Latest Ref Rng & Units 01/17/2024    2:03 PM 03/30/2023   10:30 AM 02/26/2023    8:11 PM  CMP  Glucose 70 - 99 mg/dL 896  75  85   BUN 8 - 27 mg/dL 12  10  14    Creatinine 0.57 - 1.00 mg/dL 8.93  9.06  8.79   Sodium 134 - 144 mmol/L 143  140  144   Potassium 3.5 - 5.2 mmol/L 3.6  3.9  3.6   Chloride 96 - 106 mmol/L 110  106  108   CO2 20 - 29 mmol/L 19  24    Calcium  8.7 - 10.3 mg/dL 9.1  8.5      Lipid Panel     Component Value Date/Time   CHOL 139 06/01/2021 0953   TRIG 58 06/01/2021 0953   HDL 58 06/01/2021 0953   CHOLHDL 4.7 (H) 08/08/2018 1145   LDLCALC 69 06/01/2021 0953    CBC    Component Value Date/Time   WBC 4.4 01/17/2024 1403   WBC 4.8 03/30/2023 1030   RBC 4.76 01/17/2024 1403   RBC 4.61 03/30/2023 1030   HGB 11.7 01/17/2024 1403   HCT 38.6 01/17/2024 1403   PLT 118 (L) 01/17/2024 1403   MCV 81 01/17/2024 1403  MCH 24.6 (L) 01/17/2024 1403   MCH 24.3 (L) 03/30/2023 1030   MCHC 30.3 (L) 01/17/2024 1403   MCHC 32.0 03/30/2023 1030   RDW 17.4 (H) 01/17/2024 1403   LYMPHSABS 1.8 01/17/2024 1403   MONOABS 0.8 02/26/2023 1950   EOSABS 0.0 01/17/2024 1403   BASOSABS 0.0 01/17/2024 1403    Lab Results  Component Value Date   HGBA1C 5.5 01/17/2024    Lab Results  Component Value Date   TSH 0.362 (L) 01/17/2024       Assessment & Plan Peripheral artery disease with lower extremity pain Leg pain, especially at night, with increased severity in the left leg. In office ABI revealed PAD in left leg -Risk factor modification - Placed on tizanidine  for muscle  cramps - Referred to vascular specialist for evaluation of circulation problems in the left leg.  Chronic atrial fibrillation with pacemaker Currently in sinus rhythm Experiences palpitations and dizziness. Not taking metoprolol , necessary for heart rate control. - Refilled metoprolol  prescription to manage heart rate and reduce palpitations. - Educated on the importance of taking metoprolol  regularly.  Essential hypertension Blood pressure elevated, possibly due to irregular medication intake. - Ensure regular intake of blood pressure medications. - Scheduled follow-up in one month to reassess blood pressure. -Counseled on blood pressure goal of less than 130/80, low-sodium, DASH diet, medication compliance, 150 minutes of moderate intensity exercise per week. Discussed medication compliance, adverse effects.   Type 2 diabetes mellitus, well controlled Diabetes well controlled with A1c of 5.5, managed through diet. - Continue current dietary management.  Hyperlipidemia associated with type 2 diabetes mellitus Taking rosuvastatin  for cholesterol management. - Continue rosuvastatin .  Bunion of right great toe Bunion causing discomfort. Interested in surgical correction. - Referred to podiatrist for evaluation and discussion of surgical options.  Blurry vision, left eye Reports blurry vision in the left eye. - Referred to ophthalmologist for evaluation.  Abnormal thyroid lab - Last TSH was suppressed - Will repeat at next visit   Healthcare maintenance Encounter for vaccine administration-flu shot administered Advised to use OTC antitussive for cough and if no improvement will proceed with workup at next visit.  Meds ordered this encounter  Medications   amLODipine  (NORVASC ) 5 MG tablet    Sig: Take 1 tablet (5 mg total) by mouth daily.    Dispense:  90 tablet    Refill:  1   apixaban  (ELIQUIS ) 5 MG TABS tablet    Sig: Take 1 tablet (5 mg total) by mouth 2 (two) times  daily.    Dispense:  180 tablet    Refill:  1   metoprolol  succinate (TOPROL  XL) 25 MG 24 hr tablet    Sig: Take 1 tablet (25 mg total) by mouth daily.    Dispense:  90 tablet    Refill:  1   rosuvastatin  (CRESTOR ) 10 MG tablet    Sig: Take 1 tablet (10 mg total) by mouth daily. To lower cholesterol    Dispense:  90 tablet    Refill:  1   tiZANidine  (ZANAFLEX ) 4 MG tablet    Sig: Take 1 tablet (4 mg total) by mouth at bedtime as needed.    Dispense:  30 tablet    Refill:  2   DISCONTD: Semaglutide ,0.25 or 0.5MG /DOS, (OZEMPIC , 0.25 OR 0.5 MG/DOSE,) 2 MG/1.5ML SOPN    Sig: Inject 0.25 mg into the skin once a week. For 4 weeks then increase to 0.5mg     Dispense:  2 mL  Refill:  6   Dulaglutide  (TRULICITY ) 0.75 MG/0.5ML SOAJ    Sig: Inject 0.75 mg into the skin once a week.    Dispense:  2 mL    Refill:  1    Return in about 1 month (around 03/17/2024) for Blood Pressure follow-up with PCP.        Corrina Sabin, MD, FAAFP. [facility] [place], [place] [phone]   02/16/2024, 11:30 AM     [1] No Known Allergies  "

## 2024-02-16 ENCOUNTER — Other Ambulatory Visit: Payer: Self-pay

## 2024-02-16 ENCOUNTER — Encounter: Payer: Self-pay | Admitting: Family Medicine

## 2024-02-16 DIAGNOSIS — I739 Peripheral vascular disease, unspecified: Secondary | ICD-10-CM | POA: Insufficient documentation

## 2024-02-16 DIAGNOSIS — E1169 Type 2 diabetes mellitus with other specified complication: Secondary | ICD-10-CM | POA: Insufficient documentation

## 2024-02-18 ENCOUNTER — Ambulatory Visit: Payer: Self-pay | Admitting: Cardiology

## 2024-02-20 NOTE — Congregational Nurse Program (Signed)
" °  Dept: [phone]   Congregational Nurse Program Note  Date of Encounter: 02/20/2024  Past Medical History: Past Medical History:  Diagnosis Date   Acute kidney injury 03/02/2019   Acute respiratory failure with hypoxia (HCC) 02/22/2019   Atrial fibrillation with RVR (HCC) 02/26/2019   COVID-19 02/22/2019   Dysrhythmia    para. atrial fibrillation   Flu-like symptoms 02/22/2019   Hypertension    Knee osteoarthritis 10/24/2017   Pneumonia     Encounter Details:  Community Questionnaire - 02/20/24 1158       Questionnaire   Ask client: Do you give verbal consent for me to treat you today? Yes    Chiropractor    Location Patient Served  [facility]    Encounter Setting CN site    Population Status Migrant farmer/Refugee/Immigrant    Insurance Uninsured ([employer]/Care Connects/Self-Pay/Medicaid Family Planning)    Insurance/Financial Assistance Referral N/A    Medication Have Medication Insecurities;Patient Medications Reviewed    Medical Provider Yes    Medical Referrals Made NA    Medical Appointment Completed [facility]    Screenings CN Performed (remember to also record results) NA    CNP Interventions Health Counseling;Advocate/Support;Case Management;Navigate Healthcare System;Other Education    ED Visit Averted N/A         Patient brought in medications Metoprolol , Tizanidine  and Eliquis , requesting education on self-administration. Education provided in [place] and patient with teach back method. All questions answered.  Naomie Antonious Omahoney RN BSN PCCN  [facility] [phone]    "

## 2024-02-26 ENCOUNTER — Other Ambulatory Visit: Payer: Self-pay

## 2024-02-27 ENCOUNTER — Other Ambulatory Visit: Payer: Self-pay

## 2024-02-28 ENCOUNTER — Other Ambulatory Visit: Payer: Self-pay

## 2024-02-28 ENCOUNTER — Ambulatory Visit: Payer: Self-pay | Admitting: Podiatry

## 2024-03-05 ENCOUNTER — Other Ambulatory Visit: Payer: Self-pay

## 2024-03-06 ENCOUNTER — Ambulatory Visit: Payer: Self-pay | Admitting: Podiatry

## 2024-03-21 ENCOUNTER — Ambulatory Visit: Payer: Self-pay | Admitting: Family Medicine

## 2024-03-26 ENCOUNTER — Ambulatory Visit: Payer: Self-pay | Admitting: Family Medicine

## 2024-04-11 ENCOUNTER — Encounter: Payer: Self-pay | Admitting: Vascular Surgery

## 2024-04-11 ENCOUNTER — Ambulatory Visit (HOSPITAL_COMMUNITY): Payer: Self-pay
